# Patient Record
Sex: Female | Born: 1970 | Race: Black or African American | Hispanic: No | Smoking: Former smoker
Health system: Southern US, Community
[De-identification: ages and names within clinical notes are randomized; demographics above are authoritative.]

## PROBLEM LIST (undated history)

## (undated) DIAGNOSIS — I472 Ventricular tachycardia, unspecified: Secondary | ICD-10-CM

## (undated) DIAGNOSIS — D863 Sarcoidosis of skin: Secondary | ICD-10-CM

## (undated) DIAGNOSIS — R519 Headache, unspecified: Secondary | ICD-10-CM

## (undated) DIAGNOSIS — R51 Headache: Secondary | ICD-10-CM

## (undated) DIAGNOSIS — D869 Sarcoidosis, unspecified: Secondary | ICD-10-CM

## (undated) DIAGNOSIS — A3790 Whooping cough, unspecified species without pneumonia: Secondary | ICD-10-CM

## (undated) DIAGNOSIS — G932 Benign intracranial hypertension: Secondary | ICD-10-CM

## (undated) DIAGNOSIS — D860 Sarcoidosis of lung: Secondary | ICD-10-CM

## (undated) DIAGNOSIS — J439 Emphysema, unspecified: Secondary | ICD-10-CM

## (undated) HISTORY — DX: Whooping cough, unspecified species without pneumonia: A37.90

## (undated) HISTORY — DX: Sarcoidosis of lung: D86.0

## (undated) HISTORY — DX: Emphysema, unspecified: J43.9

## (undated) HISTORY — DX: Ventricular tachycardia: I47.2

## (undated) HISTORY — DX: Ventricular tachycardia, unspecified: I47.20

## (undated) HISTORY — DX: Sarcoidosis, unspecified: D86.9

## (undated) HISTORY — DX: Headache, unspecified: R51.9

## (undated) HISTORY — DX: Sarcoidosis of skin: D86.3

## (undated) HISTORY — DX: Benign intracranial hypertension: G93.2

## (undated) HISTORY — DX: Headache: R51

---

## 2001-11-23 ENCOUNTER — Emergency Department (HOSPITAL_COMMUNITY): Admission: EM | Admit: 2001-11-23 | Discharge: 2001-11-24 | Payer: Self-pay | Admitting: Emergency Medicine

## 2002-07-24 ENCOUNTER — Inpatient Hospital Stay (HOSPITAL_COMMUNITY): Admission: AD | Admit: 2002-07-24 | Discharge: 2002-07-27 | Payer: Self-pay | Admitting: Internal Medicine

## 2002-07-25 ENCOUNTER — Encounter: Payer: Self-pay | Admitting: Internal Medicine

## 2002-08-05 ENCOUNTER — Encounter: Payer: Self-pay | Admitting: Emergency Medicine

## 2002-08-05 ENCOUNTER — Emergency Department (HOSPITAL_COMMUNITY): Admission: EM | Admit: 2002-08-05 | Discharge: 2002-08-06 | Payer: Self-pay | Admitting: Emergency Medicine

## 2002-08-07 ENCOUNTER — Emergency Department (HOSPITAL_COMMUNITY): Admission: EM | Admit: 2002-08-07 | Discharge: 2002-08-07 | Payer: Self-pay | Admitting: *Deleted

## 2002-08-08 ENCOUNTER — Other Ambulatory Visit: Admission: RE | Admit: 2002-08-08 | Discharge: 2002-08-08 | Payer: Self-pay | Admitting: Obstetrics and Gynecology

## 2002-08-29 ENCOUNTER — Encounter (INDEPENDENT_AMBULATORY_CARE_PROVIDER_SITE_OTHER): Payer: Self-pay

## 2002-08-29 ENCOUNTER — Inpatient Hospital Stay (HOSPITAL_COMMUNITY): Admission: RE | Admit: 2002-08-29 | Discharge: 2002-08-31 | Payer: Self-pay | Admitting: Obstetrics and Gynecology

## 2007-04-25 HISTORY — PX: OOPHORECTOMY: SHX86

## 2007-11-25 ENCOUNTER — Encounter: Payer: Self-pay | Admitting: Internal Medicine

## 2008-01-03 ENCOUNTER — Encounter: Payer: Self-pay | Admitting: Internal Medicine

## 2008-01-09 ENCOUNTER — Encounter: Payer: Self-pay | Admitting: Internal Medicine

## 2008-01-24 ENCOUNTER — Encounter: Payer: Self-pay | Admitting: Internal Medicine

## 2008-01-27 ENCOUNTER — Emergency Department (HOSPITAL_COMMUNITY): Admission: EM | Admit: 2008-01-27 | Discharge: 2008-01-27 | Payer: Self-pay | Admitting: Emergency Medicine

## 2008-01-28 ENCOUNTER — Emergency Department (HOSPITAL_COMMUNITY): Admission: EM | Admit: 2008-01-28 | Discharge: 2008-01-29 | Payer: Self-pay | Admitting: Emergency Medicine

## 2008-01-29 ENCOUNTER — Inpatient Hospital Stay (HOSPITAL_COMMUNITY): Admission: EM | Admit: 2008-01-29 | Discharge: 2008-01-31 | Payer: Self-pay | Admitting: Emergency Medicine

## 2008-03-02 ENCOUNTER — Inpatient Hospital Stay (HOSPITAL_COMMUNITY): Admission: RE | Admit: 2008-03-02 | Discharge: 2008-03-04 | Payer: Self-pay | Admitting: Obstetrics and Gynecology

## 2008-03-02 ENCOUNTER — Encounter (INDEPENDENT_AMBULATORY_CARE_PROVIDER_SITE_OTHER): Payer: Self-pay | Admitting: Obstetrics and Gynecology

## 2008-10-11 ENCOUNTER — Encounter: Admission: RE | Admit: 2008-10-11 | Discharge: 2008-10-11 | Payer: Self-pay | Admitting: Family Medicine

## 2008-11-27 ENCOUNTER — Encounter: Payer: Self-pay | Admitting: Internal Medicine

## 2009-02-19 ENCOUNTER — Encounter: Admission: RE | Admit: 2009-02-19 | Discharge: 2009-02-19 | Payer: Self-pay | Admitting: Obstetrics and Gynecology

## 2009-05-07 ENCOUNTER — Encounter: Payer: Self-pay | Admitting: Internal Medicine

## 2009-05-12 ENCOUNTER — Ambulatory Visit: Payer: Self-pay | Admitting: Internal Medicine

## 2009-05-12 DIAGNOSIS — G932 Benign intracranial hypertension: Secondary | ICD-10-CM

## 2009-05-12 DIAGNOSIS — R069 Unspecified abnormalities of breathing: Secondary | ICD-10-CM | POA: Insufficient documentation

## 2009-05-12 DIAGNOSIS — D869 Sarcoidosis, unspecified: Secondary | ICD-10-CM

## 2009-05-13 ENCOUNTER — Ambulatory Visit: Payer: Self-pay | Admitting: Internal Medicine

## 2009-05-14 ENCOUNTER — Ambulatory Visit: Payer: Self-pay | Admitting: Internal Medicine

## 2009-05-14 ENCOUNTER — Telehealth: Payer: Self-pay | Admitting: Internal Medicine

## 2009-05-14 LAB — CONVERTED CEMR LAB: Prothrombin Time: 12.4 s (ref 10.9–13.3)

## 2009-05-19 ENCOUNTER — Encounter: Payer: Self-pay | Admitting: Internal Medicine

## 2009-05-27 ENCOUNTER — Encounter: Payer: Self-pay | Admitting: Thoracic Surgery

## 2009-05-27 ENCOUNTER — Ambulatory Visit: Payer: Self-pay | Admitting: Internal Medicine

## 2009-05-27 ENCOUNTER — Ambulatory Visit (HOSPITAL_COMMUNITY): Admission: RE | Admit: 2009-05-27 | Discharge: 2009-05-27 | Payer: Self-pay | Admitting: Thoracic Surgery

## 2009-05-27 ENCOUNTER — Ambulatory Visit: Payer: Self-pay | Admitting: Thoracic Surgery

## 2009-05-31 ENCOUNTER — Telehealth: Payer: Self-pay | Admitting: Internal Medicine

## 2009-06-02 ENCOUNTER — Ambulatory Visit: Payer: Self-pay | Admitting: Internal Medicine

## 2009-06-07 ENCOUNTER — Telehealth (INDEPENDENT_AMBULATORY_CARE_PROVIDER_SITE_OTHER): Payer: Self-pay | Admitting: *Deleted

## 2009-06-07 ENCOUNTER — Ambulatory Visit: Payer: Self-pay | Admitting: Internal Medicine

## 2009-06-11 ENCOUNTER — Encounter: Payer: Self-pay | Admitting: Internal Medicine

## 2009-06-16 ENCOUNTER — Ambulatory Visit: Payer: Self-pay | Admitting: Internal Medicine

## 2009-06-16 LAB — CONVERTED CEMR LAB
ALT: 26 units/L (ref 0–35)
AST: 18 units/L (ref 0–37)
BUN: 10 mg/dL (ref 6–23)
Basophils Relative: 0.3 % (ref 0.0–3.0)
Bilirubin, Direct: 0.1 mg/dL (ref 0.0–0.3)
Calcium: 9.4 mg/dL (ref 8.4–10.5)
Creatinine, Ser: 0.7 mg/dL (ref 0.4–1.2)
Eosinophils Relative: 0 % (ref 0.0–5.0)
GFR calc non Af Amer: 121.78 mL/min (ref >60)
HCT: 35 % — ABNORMAL LOW (ref 36.0–46.0)
MCV: 80.1 fL (ref 78.0–100.0)
Monocytes Absolute: 0.4 10*3/uL (ref 0.1–1.0)
Monocytes Relative: 2.8 % — ABNORMAL LOW (ref 3.0–12.0)
Neutrophils Relative %: 89.1 % — ABNORMAL HIGH (ref 43.0–77.0)
Potassium: 4.6 meq/L (ref 3.5–5.1)
RBC: 4.37 M/uL (ref 3.87–5.11)
Total Protein: 7.3 g/dL (ref 6.0–8.3)
WBC: 15.1 10*3/uL — ABNORMAL HIGH (ref 4.5–10.5)

## 2009-06-25 ENCOUNTER — Telehealth: Payer: Self-pay | Admitting: Internal Medicine

## 2009-07-09 ENCOUNTER — Encounter: Payer: Self-pay | Admitting: Internal Medicine

## 2009-07-09 ENCOUNTER — Ambulatory Visit: Payer: Self-pay | Admitting: Internal Medicine

## 2009-07-14 ENCOUNTER — Telehealth (INDEPENDENT_AMBULATORY_CARE_PROVIDER_SITE_OTHER): Payer: Self-pay | Admitting: *Deleted

## 2009-07-14 LAB — CONVERTED CEMR LAB
AST: 22 units/L (ref 0–37)
Albumin: 3.7 g/dL (ref 3.5–5.2)
Alkaline Phosphatase: 91 units/L (ref 39–117)
Basophils Relative: 0.4 % (ref 0.0–3.0)
Bilirubin, Direct: 0.1 mg/dL (ref 0.0–0.3)
CO2: 32 meq/L (ref 19–32)
Calcium: 9 mg/dL (ref 8.4–10.5)
Creatinine, Ser: 0.7 mg/dL (ref 0.4–1.2)
Eosinophils Relative: 1.5 % (ref 0.0–5.0)
GFR calc non Af Amer: 121.74 mL/min (ref >60)
Hemoglobin: 11 g/dL — ABNORMAL LOW (ref 12.0–15.0)
Lymphocytes Relative: 17.7 % (ref 12.0–46.0)
Monocytes Relative: 6.7 % (ref 3.0–12.0)
Neutro Abs: 5.4 10*3/uL (ref 1.4–7.7)
Neutrophils Relative %: 73.7 % (ref 43.0–77.0)
RBC: 4.06 M/uL (ref 3.87–5.11)
Sodium: 142 meq/L (ref 135–145)
Total Protein: 6.9 g/dL (ref 6.0–8.3)
WBC: 7.3 10*3/uL (ref 4.5–10.5)

## 2009-08-13 ENCOUNTER — Ambulatory Visit: Payer: Self-pay | Admitting: Internal Medicine

## 2009-08-26 ENCOUNTER — Telehealth: Payer: Self-pay | Admitting: Internal Medicine

## 2009-08-26 LAB — CONVERTED CEMR LAB
AST: 19 units/L (ref 0–37)
Alkaline Phosphatase: 105 units/L (ref 39–117)
BUN: 18 mg/dL (ref 6–23)
Basophils Absolute: 0.1 10*3/uL (ref 0.0–0.1)
Bilirubin, Direct: 0.1 mg/dL (ref 0.0–0.3)
CO2: 25 meq/L (ref 19–32)
Calcium: 9.2 mg/dL (ref 8.4–10.5)
Chloride: 110 meq/L (ref 96–112)
Creatinine, Ser: 0.8 mg/dL (ref 0.4–1.2)
Eosinophils Absolute: 0.1 10*3/uL (ref 0.0–0.7)
Hemoglobin: 11.8 g/dL — ABNORMAL LOW (ref 12.0–15.0)
Lymphocytes Relative: 13.1 % (ref 12.0–46.0)
MCHC: 33.6 g/dL (ref 30.0–36.0)
Monocytes Relative: 2.5 % — ABNORMAL LOW (ref 3.0–12.0)
Neutro Abs: 6.9 10*3/uL (ref 1.4–7.7)
Neutrophils Relative %: 81.6 % — ABNORMAL HIGH (ref 43.0–77.0)
RBC: 4.22 M/uL (ref 3.87–5.11)
RDW: 18 % — ABNORMAL HIGH (ref 11.5–14.6)

## 2009-09-03 ENCOUNTER — Encounter: Payer: Self-pay | Admitting: Internal Medicine

## 2009-09-14 ENCOUNTER — Ambulatory Visit: Payer: Self-pay | Admitting: Internal Medicine

## 2009-09-21 ENCOUNTER — Encounter: Admission: RE | Admit: 2009-09-21 | Discharge: 2009-09-21 | Payer: Self-pay | Admitting: Obstetrics and Gynecology

## 2009-11-02 ENCOUNTER — Telehealth: Payer: Self-pay | Admitting: Internal Medicine

## 2009-11-12 ENCOUNTER — Encounter: Payer: Self-pay | Admitting: Internal Medicine

## 2009-12-25 ENCOUNTER — Encounter: Payer: Self-pay | Admitting: Internal Medicine

## 2009-12-27 ENCOUNTER — Encounter: Payer: Self-pay | Admitting: Internal Medicine

## 2010-01-03 ENCOUNTER — Encounter: Payer: Self-pay | Admitting: Internal Medicine

## 2010-01-17 ENCOUNTER — Telehealth: Payer: Self-pay | Admitting: Internal Medicine

## 2010-01-24 ENCOUNTER — Encounter: Payer: Self-pay | Admitting: Internal Medicine

## 2010-01-28 ENCOUNTER — Ambulatory Visit: Payer: Self-pay | Admitting: Internal Medicine

## 2010-01-31 ENCOUNTER — Telehealth (INDEPENDENT_AMBULATORY_CARE_PROVIDER_SITE_OTHER): Payer: Self-pay | Admitting: *Deleted

## 2010-03-01 ENCOUNTER — Encounter: Admission: RE | Admit: 2010-03-01 | Discharge: 2010-03-01 | Payer: Self-pay | Admitting: Obstetrics and Gynecology

## 2010-06-13 ENCOUNTER — Ambulatory Visit: Payer: Self-pay | Admitting: Internal Medicine

## 2010-06-13 ENCOUNTER — Telehealth (INDEPENDENT_AMBULATORY_CARE_PROVIDER_SITE_OTHER): Payer: Self-pay | Admitting: *Deleted

## 2010-06-16 ENCOUNTER — Encounter: Payer: Self-pay | Admitting: Internal Medicine

## 2010-06-17 LAB — CONVERTED CEMR LAB
BUN: 9 mg/dL (ref 6–23)
Basophils Absolute: 0 10*3/uL (ref 0.0–0.1)
Basophils Relative: 0.1 % (ref 0.0–3.0)
Bilirubin, Direct: 0.1 mg/dL (ref 0.0–0.3)
CO2: 28 meq/L (ref 19–32)
Calcium: 9.4 mg/dL (ref 8.4–10.5)
Chloride: 108 meq/L (ref 96–112)
Creatinine, Ser: 0.6 mg/dL (ref 0.4–1.2)
Eosinophils Absolute: 0 10*3/uL (ref 0.0–0.7)
Glucose, Bld: 115 mg/dL — ABNORMAL HIGH (ref 70–99)
Lymphocytes Relative: 4.6 % — ABNORMAL LOW (ref 12.0–46.0)
MCHC: 32.4 g/dL (ref 30.0–36.0)
MCV: 79.1 fL (ref 78.0–100.0)
Monocytes Absolute: 0.3 10*3/uL (ref 0.1–1.0)
Neutrophils Relative %: 92.9 % — ABNORMAL HIGH (ref 43.0–77.0)
Platelets: 826 10*3/uL — ABNORMAL HIGH (ref 150.0–400.0)
RDW: 16.4 % — ABNORMAL HIGH (ref 11.5–14.6)
Total Bilirubin: 0.3 mg/dL (ref 0.3–1.2)
Total Protein: 8.1 g/dL (ref 6.0–8.3)

## 2010-07-25 ENCOUNTER — Ambulatory Visit: Payer: Self-pay | Admitting: Internal Medicine

## 2010-08-08 ENCOUNTER — Telehealth: Payer: Self-pay | Admitting: Internal Medicine

## 2010-09-06 ENCOUNTER — Ambulatory Visit: Payer: Self-pay | Admitting: Internal Medicine

## 2010-09-06 ENCOUNTER — Encounter: Payer: Self-pay | Admitting: Internal Medicine

## 2010-09-07 LAB — CONVERTED CEMR LAB
ALT: 32 units/L (ref 0–35)
AST: 22 units/L (ref 0–37)
Albumin: 4 g/dL (ref 3.5–5.2)
BUN: 11 mg/dL (ref 6–23)
Basophils Relative: 0.1 % (ref 0.0–3.0)
Chloride: 102 meq/L (ref 96–112)
Creatinine, Ser: 0.8 mg/dL (ref 0.4–1.2)
Eosinophils Relative: 0.7 % (ref 0.0–5.0)
Glucose, Bld: 83 mg/dL (ref 70–99)
Hemoglobin: 12.5 g/dL (ref 12.0–15.0)
Hgb A1c MFr Bld: 6.5 % (ref 4.6–6.5)
MCV: 84.9 fL (ref 78.0–100.0)
Monocytes Absolute: 0.9 10*3/uL (ref 0.1–1.0)
Neutro Abs: 7.1 10*3/uL (ref 1.4–7.7)
Neutrophils Relative %: 75.9 % (ref 43.0–77.0)
Potassium: 4.2 meq/L (ref 3.5–5.1)
RBC: 4.45 M/uL (ref 3.87–5.11)
WBC: 9.4 10*3/uL (ref 4.5–10.5)

## 2011-01-18 ENCOUNTER — Telehealth (INDEPENDENT_AMBULATORY_CARE_PROVIDER_SITE_OTHER): Payer: Self-pay | Admitting: *Deleted

## 2011-01-20 ENCOUNTER — Other Ambulatory Visit: Payer: Self-pay | Admitting: Internal Medicine

## 2011-01-20 ENCOUNTER — Ambulatory Visit
Admission: RE | Admit: 2011-01-20 | Discharge: 2011-01-20 | Payer: Self-pay | Source: Home / Self Care | Attending: Internal Medicine | Admitting: Internal Medicine

## 2011-01-20 ENCOUNTER — Encounter: Payer: Self-pay | Admitting: Internal Medicine

## 2011-01-20 DIAGNOSIS — A3790 Whooping cough, unspecified species without pneumonia: Secondary | ICD-10-CM | POA: Insufficient documentation

## 2011-01-20 DIAGNOSIS — I472 Ventricular tachycardia: Secondary | ICD-10-CM | POA: Insufficient documentation

## 2011-01-20 DIAGNOSIS — E559 Vitamin D deficiency, unspecified: Secondary | ICD-10-CM | POA: Insufficient documentation

## 2011-01-20 LAB — CBC WITH DIFFERENTIAL/PLATELET
Basophils Absolute: 0 10*3/uL (ref 0.0–0.1)
Hemoglobin: 12 g/dL (ref 12.0–15.0)
Lymphocytes Relative: 10.4 % — ABNORMAL LOW (ref 12.0–46.0)
Monocytes Relative: 10.8 % (ref 3.0–12.0)
Neutro Abs: 6.7 10*3/uL (ref 1.4–7.7)
RDW: 14.8 % — ABNORMAL HIGH (ref 11.5–14.6)

## 2011-01-20 LAB — BASIC METABOLIC PANEL
CO2: 27 mEq/L (ref 19–32)
Chloride: 107 mEq/L (ref 96–112)
Creatinine, Ser: 0.8 mg/dL (ref 0.4–1.2)

## 2011-01-20 LAB — HEPATIC FUNCTION PANEL
ALT: 28 U/L (ref 0–35)
Alkaline Phosphatase: 135 U/L — ABNORMAL HIGH (ref 39–117)
Bilirubin, Direct: 0 mg/dL (ref 0.0–0.3)
Total Protein: 8 g/dL (ref 6.0–8.3)

## 2011-01-22 LAB — CONVERTED CEMR LAB
ALT: 15 units/L (ref 0–35)
AST: 16 units/L (ref 0–37)
Basophils Absolute: 0.3 10*3/uL — ABNORMAL HIGH (ref 0.0–0.1)
Bilirubin, Direct: 0.1 mg/dL (ref 0.0–0.3)
Chloride: 105 meq/L (ref 96–112)
Eosinophils Absolute: 0.1 10*3/uL (ref 0.0–0.7)
Lymphocytes Relative: 19 % (ref 12.0–46.0)
MCHC: 33.3 g/dL (ref 30.0–36.0)
Neutrophils Relative %: 67 % (ref 43.0–77.0)
Potassium: 3.9 meq/L (ref 3.5–5.1)
RBC: 4.27 M/uL (ref 3.87–5.11)
RDW: 14.6 % (ref 11.5–14.6)
Total Bilirubin: 0.4 mg/dL (ref 0.3–1.2)

## 2011-01-23 ENCOUNTER — Telehealth: Payer: Self-pay | Admitting: Internal Medicine

## 2011-01-26 DIAGNOSIS — R002 Palpitations: Secondary | ICD-10-CM

## 2011-01-26 NOTE — Letter (Signed)
Summary: Out of Work  [employer]  [address]   [place], [place] [address]   Phone: [phone]  Fax: [phone]    January 20, 2011   Employee:  Alexandra Hood    To Whom It May Concern:   For Medical reasons, please excuse the above named employee from work.   If you need additional information, please feel free to contact our office.         Sincerely,    Dr. Kalman Shan

## 2011-01-26 NOTE — Assessment & Plan Note (Signed)
Summary: rov ///kp   Visit Type:  Follow-up Copy to:  Dr. Janace Hoard Primary Provider/Referring Provider:  Dr. Janace Hoard [facility], DR. Miller at [facility] in [facility], Dr. Dione Booze at [facility] eye  CC:  6 wk follow up.  Pt states cough has resolved.  Denies SOB, wheezing, and chest tightness.  No complaints.  .  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   OV 07/25/2010:   Lsat visit was June 13, 2010.  At that time she reported  NEW cough even though the  the isolated lupus pernio in her nose had improved. PFT reviw showed deterioratioin in lung function and CXR showed progressive disease. So we restarted prednisone and methotrexate with folic acid/bactrim. Since then cough has resolved. She is feeling better overall. More energetic. Only issue is 13# weight gain to 109#. Fev1 shows modest improveemt from 1.6L pretreatment to 1.84L today which is still shy of personal best of 2.3L in Sept 2010 which was done 3 months into pred/mtx at [facility] in [place].    Preventive Screening-Counseling & Management  Alcohol-Tobacco     Smoking Status: quit  Current Medications (verified): 1)  Diamox 250 Mg .... Take 1 Tablet By Mouth Two Times A Day 2)  Motrin Ib 200 Mg Tabs (Ibuprofen) .... As Directed 3)  Methotrexate 2.5 Mg Tabs (Methotrexate Sodium) .... Take 2 Tablet Every Monday X 1 Week Tkae 3 Tablets Every Monday X 1 Week Then Take 4 Tabs Every Monday X Continue 4)  Prednisone 10 Mg  Tabs (Prednisone) .... Take 1 Tablet Per Day Starting July 13, 2010 5)  Folic Acid 800 Mcg Tabs (Folic Acid) .... Take 1 Tablet By Mouth Two Times A Day 6)  Bactrim Ds 800-160 Mg Tabs (Sulfamethoxazole-Trimethoprim) .... Take 1 Tablet On Monday, Wednesday,  Friday  Allergies (verified): No Known Drug Allergies  Past History:  Past Medical  History: UPDATED 07/09/2009 #Pseudotumor Cerebri ........Marland KitchenDr Hyacinth Meeker and Dr. Dione Booze -> dxed Jan 2009, on diamox -> LP 01/24/2008 at [facility] - Normal ( pressure 14cm, gluc 68, Protein 23, Clear, colorless, RBC 3, WBC - too few to count) -> CT head non contrast done at [facility] 11/25/2007: "normal". Ddoes not report MRI, MRA, MRV being done > 2nd opinon by Dr. Pearlean Brownie - reportdly had MRI/MRV both normal; followup pending  #LUPUS PERNIO/SARCOIDOSIS -> skin bx confirmed at [facility] 05/07/2009 (personally reviewed) >looks improved 07/09/2009 -picture taken #STage 2 pulmonary sarcoidosois.............Lavinia Sharps -> CT chest 05/13/2009. -> bx confirmed by EBUS and RUL TBBx on 05/27/2009 >Commence prednisone/methotrexate/folic acid/bactrim 06/02/2009 >PFT 06/07/2009: Fev1 2.2L/77%, FVC 2.68L/77%, TLC 3.8L/75%, DLCO 19/77% > July 2010: asymptomatic but cxr without change > Cleda Daub 09/03/2009 at [facility] - ->  fev1 2.3L/ FVC 89%, 2.84L/90%.  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  These include a 1.3   cm lesion in the medial segment left hepatic lobe on image 43 of   series 2; a 1.0 cm lesion of the lateral segment left hepatic lobe   on image 46 of series  2; a 1.7 x 1.4 cm lesion of the right hepatic   lobe on image 53 of series 2 with associated adjacent transit   hepatic attenuation difference; and several additional visualized   small hypodense liver lesions.  An enlarged portacaval node   measures 3.6 x 1.4 cm on image 54 series 2. #PPD negative - 04/2009  Past Surgical History: Reviewed history from 05/12/2009 and no changes required. Removal of Left  Ovary-2004 Removalof right ovary and fallopian tubes 2009  Family History: Reviewed history from 05/12/2009 and no changes required. Allergies-mother No sarcoid, asthma,   Social History: Reviewed history from 05/12/2009 and no changes required. 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 Smoked for 17 years before that - 4 cig/daySmoking Status:  quit  Review of Systems       The patient complains of weight change.  The patient denies shortness of breath with activity, shortness of breath at rest, productive cough, non-productive cough, coughing up blood, chest pain, irregular heartbeats, acid heartburn, indigestion, loss of appetite, abdominal pain, difficulty swallowing, sore throat, tooth/dental problems, headaches, nasal congestion/difficulty breathing through nose, sneezing, itching, ear ache, anxiety, depression, hand/feet swelling, joint stiffness or pain, rash, change in color of mucus, and fever.    Vital Signs:  Patient profile:   40 year old female Height:      64 inches Weight:      190.13 pounds BMI:     32.75 O2 Sat:      98 % on Room air Temp:     96.6 degrees F oral Pulse rate:   121 / minute BP sitting:   128 / 88  (right arm) Cuff size:   regular  Vitals Entered By: Gweneth Dimitri RN (July 25, 2010 2:45 PM)  O2 Flow:  Room air CC: 6 wk follow up.  Pt states cough has resolved.  Denies SOB, wheezing, chest tightness.  No complaints.   Comments Medications reviewed with patient Daytime contact number verified with patient. Gweneth Dimitri RN  July 25, 2010 2:45 PM    Physical Exam  General:  well developed, well nourished, in no acute distress.Pleasant female Head:  normocephalic and atraumatic Eyes:  PERRLA/EOM intact; conjunctiva and sclera clear Ears:  TMs intact and clear with normal canals Nose:  flat scab lupus pernio in right nasal bridge; looks almost resolved Mouth:  no deformity or lesions Neck:   no masses, thyromegaly, or abnormal cervical nodes Chest Wall:  no deformities noted Lungs:  clear bilaterally to auscultation and percussion Heart:  regular rate and rhythm, S1, S2 without murmurs, rubs, gallops, or clicks Abdomen:  bowel sounds positive; abdomen soft and non-tender without masses, or organomegaly Msk:  rt shoulder painful abdouction Pulses:  pulses normal Extremities:  no clubbing, cyanosis, edema, or deformity noted Neurologic:  CN II-XII grossly intact with normal reflexes, coordination, muscle strength and tone Skin:  intact without lesions or rashes Cervical Nodes:  no significant adenopathy Axillary Nodes:  no significant adenopathy Psych:  alert and cooperative; normal mood and affect; normal attention span and concentration   Pulmonary Function Test Date: 07/25/2010 3:03 PM Height (in.): 64 Gender: Female  Pre-Spirometry FVC    Value: 2.25 L/min   % Pred: 72.50 % FEV1    Value: 1.84 L     Pred: 2.58 L     % Pred: 71.40 % FEV1/FVC  Value: 82.05 %     % Pred: 97.60 %  Comments: improved  Evaluation: moderate obstruction with NO significant bronchodilator response  Impression & Recommendations:  Problem # 1:  SARCOIDOSIS (ICD-135) Assessment Improved Since coming off methotrexate in and weaning prednisine downt to 5mg  per day in sept 2010, sarcoidosis has progressed as evicencd by cxr getting worse in feb 2011 and cough in with worsening restriction on PFTs in June 2011. REcommenced pred burst and methotrexate in June 13, 2010. On visit 07/25/2010 cough resolved and spiromettry improved althought not at baseline. WE discussed further taper down of prednisone to 5mg  per day but she prefers to be at 10mg  per day despite weight gain. SHe hopes that with 10mg  per day and methtotrexate that her PFTS will slowly bounce back to baseline. I support this plan  Problem # 2:  ENCOUNTER FOR THERAPEUTIC DRUG MONITORING (ICD-V58.83) Assessment: Unchanged  monitor weight  gain check vitamin d next visit check hgba1c and lfts  Orders: T- * Misc. Laboratory test [phone]) Est. Patient Level IV (13086)  Problem # 3:  PALPITATIONS (ICD-785.1) Assessment: Improved  still in remission  Orders: Est. Patient Level IV (57846)  Problem # 4:  UNSPECIFIED RESPIRATORY ABNORMALITY (ICD-786.00)  Medications Added to Medication List This Visit: 1)  Folic Acid 800 Mcg Tabs (Folic acid) .... Take 1 tablet by mouth two times a day 2)  Bactrim Ds 800-160 Mg Tabs (Sulfamethoxazole-trimethoprim) .... Take 1 tablet on monday, wednesday,  friday  Patient Instructions: 1)  your breathing test shows improvement but not at baseline yet 2)  continue prednisone at 10mg  per day 3)  continue methotrexate 10mg  per week 4)  continue bactrim as before 5)  continue folic acid as before 6)  return in 6 weeks 7)  we will do spirometry at next visit 8)  check your vitamin d level today  9)  my nurse will print out your breathing test for your files     [employer] Spirometry  ID: [id number] Patient: Alexandra Hood, Alexandra Hood DOB: April 07, 1971 Age: 40 Years Old Sex: Female Race: Undetermined Height: 64 Weight: 190.13 Status: Unconfirmed Past Medical History:  UPDATED 07/09/2009 #Pseudotumor Cerebri ........Marland KitchenDr Hyacinth Meeker and Dr. Dione Booze -> dxed Jan 2009, on diamox -> LP 01/24/2008 at [facility] - Normal ( pressure 14cm, gluc 68, Protein 23, Clear, colorless, RBC 3, WBC - too few to count) -> CT head non contrast done at [facility] 11/25/2007: "normal". Ddoes not report MRI, MRA, MRV being done > 2nd opinon by Dr. Pearlean Brownie - reportdly had MRI/MRV both normal; followup pending  #LUPUS PERNIO/SARCOIDOSIS -> skin bx confirmed at [facility] 05/07/2009 (personally reviewed) >looks improved 07/09/2009 -picture taken #STage 2 pulmonary sarcoidosois.............Lavinia Sharps -> CT chest 05/13/2009. -> bx confirmed by EBUS and RUL TBBx on 05/27/2009 >Commence  prednisone/methotrexate/folic acid/bactrim 06/02/2009 >PFT 06/07/2009: Fev1 2.2L/77%, FVC 2.68L/77%, TLC 3.8L/75%, DLCO 19/77% > July 2010: asymptomatic but cxr without change > Cleda Daub 09/03/2009 at [facility] - ->  fev1 2.3L/ FVC 89%, 2.84L/90%.  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  These include a 1.3   cm lesion in the medial segment left hepatic lobe on image 43 of   series 2; a 1.0 cm lesion of the lateral segment left hepatic lobe   on image 46 of series 2; a 1.7 x 1.4 cm lesion of the right hepatic   lobe on  image 53 of series 2 with associated adjacent transit   hepatic attenuation difference; and several additional visualized   small hypodense liver lesions.  An enlarged portacaval node   measures 3.6 x 1.4 cm on image 54 series 2. #PPD negative - 04/2009 Recorded: 07/25/2010 3:03 PM  Parameter  Measured Predicted %Predicted FVC     2.25        3.10        72.50 FEV1     1.84        2.58        71.40 FEV1%   82.05        84.11        97.60 PEF    4.08        6.66        61.20   Interpretation:

## 2011-01-26 NOTE — Progress Notes (Signed)
Summary: med - LMTCB  x1  Phone Note Call from Patient Call back at [facility] Phone [phone]   Caller: Patient Call For: ramaswamy Reason for Call: Talk to Nurse Summary of Call: heard of a new medication to go with prednisone - topomax.  Could she use this in addition to prednisone Initial call taken by: Eugene Gavia,  August 08, 2010 4:42 PM  Follow-up for Phone Call        [facility] Yetta Barre RN  August 08, 2010 4:50 PM\par  Pt returned call.  States she has heard that topamax along withprednisone can be used to treat sarcoidosis.  Would like to know if this is true and if so, would she be a canadiate for this.  Aware MR out of the office until tomorrow--she is ok with this.  MR, pls advise.  Thanks! Follow-up by: Gweneth Dimitri RN,  August 08, 2010 5:29 PM  Additional Follow-up for Phone Call Additional follow up Details #1::        SPOKE WITH PATIEnt: her QUESTION was not if topamax is used in treatment of sarcoid. But, her question was if topamax is safe to take when being treated for sarcoid with prednisone. She already spoke to her neurologist and got this clarified. She has been told it is safe and she has scriopt for topamax from her neurologist Additional Follow-up by: Kalman Shan MD,  August 09, 2010 6:16 PM

## 2011-01-26 NOTE — Assessment & Plan Note (Signed)
Summary: 1 year return/mhh   Copy to:  Dr. Janace Hoard Primary Provider/Referring Provider:  Dr. Janace Hoard [facility], DR. Hyacinth Meeker at [facility] in [facility], Dr. Dione Booze at [facility] eye   History of Present Illness: she no showed and rescheduled  Allergies: No Known Drug Allergies   Other Orders: No Charge Patient Arrived (NCPA0) (NCPA0)

## 2011-01-26 NOTE — Progress Notes (Signed)
Summary: Medical Release completed  Medical release completed by patient to obtain records from Dr. Althea Charon. Release faxed to [phone]. Dena Chavis  January 31, 2010 9:02 AM  Appended Document: Medical Release completed Records received from Dr. Althea Charon today and forwarded to Dr. Marchelle Gearing for review.

## 2011-01-26 NOTE — Miscellaneous (Signed)
Summary: Orders Update pft charges  Clinical Lists Changes  Orders: Added new Service order of Carbon Monoxide diffusing w/capacity (94720) - Signed Added new Service order of Lung Volumes (94240) - Signed Added new Service order of Spirometry (Pre & Post) (94060) - Signed 

## 2011-01-26 NOTE — Progress Notes (Signed)
Summary: returned call  Phone Note Outgoing Call Call back at [phone]   Call placed by: Carron Curie CMA,  January 17, 2010 3:44 PM Call placed to: Patient Summary of Call: [facility] and had an xray done that had some sbnormailities on it.  MR wanted to get pt scheduled for an appt thsi week if possible. LMTCB with the patient.  Initial call taken by: Carron Curie CMA,  January 17, 2010 3:47 PM  Follow-up for Phone Call        pt returning call to [facility]. Follow-up by: Eugene Gavia,  January 18, 2010 9:00 AM  Additional Follow-up for Phone Call Additional follow up Details #1::        spoke with MR and there was some misunderstaqnding with the letter snet form [facility]. Okay for pt to f/u first available. Carron Curie CMA  January 18, 2010 12:18 PM pt scheduled to see MR on 01/28/10. pt aware. Carron Curie CMA  January 18, 2010 12:21 PM

## 2011-01-26 NOTE — Assessment & Plan Note (Signed)
Summary: 6 weeks/apc   Visit Type:  Follow-up Copy to:  Dr. Janace Hoard Primary Provider/Referring Provider:  Dr. Janace Hoard [facility], DR. Hyacinth Meeker at [facility] in [facility], Dr. Dione Booze at [facility] eye  CC:  Pt here for follow-up. Pt denies any new complaints at this time. 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 Folloowup Lupuse Pernio and Stage 2 Pulmonary sarcoid - s/p Pred/Methotrexate Rx June 2010 through sept 2010 wiht nomralization of PFT. Progressive disease Feb - June 2011 with cough. Restart methotrexate and increase prednisone June 2011.    September 06, 2010: Lsat visit was August 2011. At that viist cough had resolved after restarting methotrxate and increasing prednisone i June 2011. PFTs although better was still not at baseline. So, she opted to continue pred at 10mg  per day. Today's visit is fu to asess symptoms and spirometry. SHe contnues to be free of resp symptoms. But has gained 8# weight and is c/p occ leg cramps past 2 weeks. No other symptoms.  Wants to wait for flu shot to be given at her work place  Preventive Screening-Counseling & Management  Alcohol-Tobacco     Smoking Status: quit     Packs/Day: <0.25     Year Started: 1992     Year Quit: 2009     Pack years: 4.25     Tobacco Counseling: not to resume use of tobacco products  Current Medications (verified): 1)  Diamox 250 Mg .... Take 1 Tablet By Mouth Two Times A Day 2)  Motrin Ib 200 Mg Tabs (Ibuprofen) .... As Directed 3)  Methotrexate 2.5 Mg Tabs (Methotrexate Sodium) .... Take 2 Tablet Every Monday X 1 Week Tkae 3 Tablets Every Monday X 1 Week Then Take 4 Tabs Every Monday X Continue 4)  Folic Acid 800 Mcg Tabs (Folic Acid) .... Take 1 Tablet By Mouth Two Times A Day 5)  Bactrim Ds 800-160 Mg Tabs (Sulfamethoxazole-Trimethoprim) .... Take 1 Tablet On Monday, Wednesday,  Friday 6)  Prednisone 10 Mg Tabs (Prednisone) .... Take 1 Tablet By Mouth Once A Day 7)   Topamax 25 Mg Tabs (Topiramate) .... Take 1 Tablet By Mouth Two Times A Day 8)  Calcium 500 Mg Tabs (Calcium) .... Take 1 Tablet By Mouth Once A Day 9)  Vitamin D3 1000 Unit Tabs (Cholecalciferol) .... Take 1 Tablet By Mouth Once A Day 10)  Iron 325 (65 Fe) Mg Tabs (Ferrous Sulfate) .... Take 1 Tablet By Mouth Once A Day  Allergies (verified): No Known Drug Allergies  Past History:  Past medical, surgical, family and social histories (including risk factors) reviewed, and no changes noted (except as noted below).  Past Medical History: U#Pseudotumor Cerebri ........Marland KitchenDr Hyacinth Meeker and Dr. Dione Booze  - dxed Jan 2009, on diamox  - LP 01/24/2008 at [facility] - Normal ( pressure 14cm, gluc 68, Protein 23, Clear, colorless, RBC 3, WBC - too few to count) - CT head non contrast done at [facility] 11/25/2007: "normal". Ddoes not report MRI, MRA, MRV being done  - 2nd opinon by Dr. Pearlean Brownie - reportdly had MRI/MRV both normal; followup pending  #LUPUS PERNIO/SARCOIDOSIS -skin bx confirmed at [facility] 05/07/2009 (personally reviewed)  -looks improved 07/09/2009 -picture taken  #STage 2 pulmonary sarcoidosois.............Lavinia Sharps - CT chest 05/13/2009. -> bx confirmed by EBUS and RUL TBBx on 05/27/2009  -Commence prednisone/methotrexate/folic acid/bactrim 06/02/2009  -PFT 06/07/2009: Fev1 2.2L/77%, FVC 2.68L/77%, TLC 3.8L/75%, DLCO 19/77%  - July 2010: asymptomatic but cxr without  change  Cleda Daub 09/03/2009 at [facility] - ->  fev1 2.3L/ FVC 89%, 2.84L/90%.  Ratio 83 -> stop mtx, wean pred  - CXR 01/28/2010 -> progressive diseae -> continue pred at 5mg   - PFT 06/13/2010 on pred 5mg  - fev1 1.6L/54%, FVC 2.0L/55%, Ratio 77% , TLC 60%, DLCO 16/63% -> restart pred/mtx  Cleda Daub 07/25/2010 on pred 10/mtx 10 -> FEv1 1.84L/775%, FVC 2.25L  - SPiro 09/06/2010 -> Fev1 2.14L/81%. Cut pred to 5mg  per day. 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 Removal of Left Ovary-2004 Removalof right ovary and fallopian tubes 2009  Family History: Reviewed history from 05/12/2009 and no changes required. Allergies-mother No sarcoid, asthma,   Social History: Reviewed history from 05/12/2009 and no changes required. 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 Smoked for 17 years before that - 4 cig/dayPacks/Day:  <0.25 Pack years:  4.25  Review of Systems  The patient denies shortness of breath with activity, shortness of breath at rest, productive cough, non-productive cough, coughing up blood, chest pain, irregular heartbeats, acid heartburn, indigestion, loss of appetite, weight change, abdominal pain, difficulty swallowing, sore throat, tooth/dental problems, headaches, nasal congestion/difficulty breathing through nose, sneezing, itching, ear ache, anxiety, depression, hand/feet swelling, joint stiffness or pain, rash, change in color of mucus, and fever.    Vital Signs:  Patient profile:   40 year old female Height:      64 inches Weight:      198 pounds BMI:     34.11 O2 Sat:      98 % on Room air Temp:     98.6 degrees F oral Pulse rate:   127 / minute BP sitting:   130 / 80  (right arm) Cuff size:   regular  Vitals Entered By: Carron Curie CMA (September 06, 2010 3:39 PM)  O2 Flow:  Room air CC: Pt here for follow-up. Pt denies any new complaints at this time.    Physical Exam  General:  well developed, well nourished, in no acute distress.Pleasant female Head:  normocephalic and atraumatic Eyes:  PERRLA/EOM intact; conjunctiva and sclera clear Ears:  TMs intact and clear with normal canals Nose:  flat scab lupus pernio in right nasal bridge; looks almost resolved Mouth:  no deformity or lesions Neck:  no masses, thyromegaly, or abnormal cervical nodes Chest Wall:  no deformities noted Lungs:  clear  bilaterally to auscultation and percussion Heart:  regular rate and rhythm, S1, S2 without murmurs, rubs, gallops, or clicks Abdomen:  bowel sounds positive; abdomen soft and non-tender without masses, or organomegaly Msk:  rt shoulder painful abdouction Pulses:  pulses normal Extremities:  no clubbing, cyanosis, edema, or deformity noted Neurologic:  CN II-XII grossly intact with normal reflexes, coordination, muscle strength and tone Skin:  intact without lesions or rashes Cervical Nodes:  no significant adenopathy Axillary Nodes:  no significant adenopathy Psych:  alert and cooperative; normal mood and affect; normal attention span and concentration   Impression & Recommendations:  Problem # 1:  SARCOIDOSIS (ICD-135) Assessment Improved Fev1 is back to normal and baseline onpred 10mg  and methotrexate since starting them in June 2011. Continues to be asymptomatic. HAving some steroid side effects (Weight gain, myalgia). So, will redcue pred to 5mg  per day but continue methtorexate at 10mg  per week. Conitnue bactrim and folic acid. ROV 4 months.Cleda Daub at fu  Problem #  2:  ENCOUNTER FOR THERAPEUTIC DRUG MONITORING (ICD-V58.83) Assessment: Unchanged check labs on methotrexate and prednisone. Weight gain +. Myalgia + Orders: TLB-CBC Platelet - w/Differential (85025-CBCD) TLB-BMP (Basic Metabolic Panel-BMET) (80048-METABOL) TLB-Hepatic/Liver Function Pnl (80076-HEPATIC) TLB-A1C / Hgb A1C (Glycohemoglobin) (83036-A1C) Est. 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 Patient Level IV (60454)  Medications Added to Medication List This Visit: 1)  Prednisone 10 Mg Tabs (Prednisone) .... Take 1 tablet by mouth once a day 2)  Topamax 25 Mg Tabs (Topiramate) .... Take 1 tablet by mouth two times a day 3)  Calcium 500 Mg Tabs (Calcium) .... Take 1 tablet by mouth once a day 4)  Vitamin  D3 1000 Unit Tabs (Cholecalciferol) .... Take 1 tablet by mouth once a day 5)  Iron 325 (65 Fe) Mg Tabs (Ferrous sulfate) .... Take 1 tablet by mouth once a day  Patient Instructions: 1)  #SARCOID 2)  your breathing test shows improvement to baseline 3)  continue prednisone but cut down to 5mg   per day 4)  continue methotrexate 10mg  per week 5)  continue bactrim as before 6)  continue folic acid as before 7)  return in 4 months 8)  we will do spirometry at next visit 9)  will consider cxr at followup 10)  #VITAMIN D 11)  - contnue vit d3 at 1000 units per dy with calcium 12)  #HEALTH 13)  - have flu shot asap

## 2011-01-26 NOTE — Assessment & Plan Note (Signed)
Summary: rov/jd   Visit Type:  Follow-up Copy to:  Dr. Janace Hoard Primary Provider/Referring Provider:  Dr. Janace [facility], DR. Hyacinth Meeker at [facility] in [facility], Dr. Dione Booze at [facility] eye  CC:  Followup with PFT's.  Pt states that her breathing has been fine.  She c/o cough x 3 wks- prod in the am with clear/white sputum.  Throughout the day cough is dry and hacky- coughs sometimes to the point she vomits..  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 Since last visit several months ago she has developed NEW cough for past 3 weeks. It is  dry and moderate in severity. Present day and night. Asscited with gagging and vomitus of clear content. Denies post nasal drip, gerd or hemoptysis. Denies fevers, B symptoms, joint pains, palpitations, ocular symptoms or dermal symptoms and headaches. . In fact the isolated lupus pernio in her nose is improving. REviewd PFTs. At time of start of pred/mtx in June 2011 she only had mild Restrction (TLC 75%, DLCO 77%) . This had normalized in Sept 2010 when she went down to [facility] for 2nd opion. AFter that methotrexate was stopped and she weaned hte prednisone down to 5mg  per day. Today, the restiction is worse than 1 year ago and is moderate in severity (TLC 60%, DLCO 63%) . CXR from 01/28/2010 shows progressive pulmonary diseae in my opinion.    Current Medications (verified): 1)  Diamox 250 Mg .... Take 1 Tablet By Mouth Two Times A Day 2)  Motrin Ib 200 Mg Tabs (Ibuprofen) .... As Directed 3)  Prednisone 5 Mg Tabs (Prednisone) .Marland Kitchen.. 1 Every Am  Allergies (verified): No Known Drug Allergies  Past History:  Past Medical History: UPDATED 07/09/2009 #Pseudotumor Cerebri ........Marland KitchenDr Hyacinth Meeker and Dr. Dione Booze -> dxed Jan 2009, on diamox -> LP 01/24/2008 at [facility] - Normal ( pressure 14cm,  gluc 68, Protein 23, Clear, colorless, RBC 3, WBC - too few to count) -> CT head non contrast done at [facility] 11/25/2007: "normal". Ddoes not report MRI, MRA, MRV being done > 2nd opinon by Dr. Pearlean Brownie - reportdly had MRI/MRV both normal; followup pending  #LUPUS PERNIO/SARCOIDOSIS -> skin bx confirmed at [facility] 05/07/2009 (personally reviewed) >looks improved 07/09/2009 -picture taken #STage 2 pulmonary sarcoidosois.............Lavinia Sharps -> CT chest 05/13/2009. -> bx confirmed by EBUS and [facility] T[facility]x on 05/27/2009 >Commence prednisone/methotrexate/folic acid/bactrim 06/02/2009 >PFT 06/07/2009: Fev1 2.2L/77%, FVC 2.68L/77%, TLC 3.8L/75%, DLCO 19/77% > July 2010: asymptomatic but cxr without change > Cleda Daub 09/03/2009 at [facility] - ->  fev1 2.3L/ FVC 89%, 2.84L/90%.  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  These include a 1.3   cm lesion in the medial segment left hepatic lobe on image 43 of   series 2; a 1.0 cm lesion of the lateral segment left hepatic lobe   on image 46 of series 2; a 1.7 x 1.4 cm lesion of the right hepatic   lobe on image 53 of series 2 with associated adjacent transit   hepatic attenuation difference; and several additional visualized   small hypodense liver lesions.  An enlarged portacaval node   measures 3.6 x 1.4  cm on image 54 series 2. #PPD negative - 04/2009  Family History: Reviewed history from 05/12/2009 and no changes required. Allergies-mother No sarcoid, asthma,   Social History: Reviewed history from 05/12/2009 and no changes required. 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 Smoked for 17 years before that - 4 cig/day  Review of Systems       The patient complains of  productive cough and non-productive cough.  The patient denies shortness of breath with activity, shortness of breath at rest, coughing up blood, chest pain, irregular heartbeats, acid heartburn, indigestion, loss of appetite, weight change, abdominal pain, difficulty swallowing, sore throat, tooth/dental problems, headaches, nasal congestion/difficulty breathing through nose, sneezing, itching, ear ache, anxiety, depression, hand/feet swelling, joint stiffness or pain, rash, change in color of mucus, and fever.    Vital Signs:  Patient profile:   40 year old female Height:      64 inches Weight:      177 pounds BMI:     30.49 O2 Sat:      97 % on Room air Temp:     98.5 degrees F oral Pulse rate:   123 / minute BP sitting:   106 / 66  (left arm)  Vitals Entered By: Vernie Murders (June 13, 2010 9:42 AM)  O2 Flow:  Room air  Physical Exam  General:  well developed, well nourished, in no acute distress.Pleasant female Head:  normocephalic and atraumatic Eyes:  PERRLA/EOM intact; conjunctiva and sclera clear Ears:  TMs intact and clear with normal canals Nose:  flat scab lupus pernio in right nasal bridge; looks almost resolved Mouth:  no deformity or lesions Neck:  no masses, thyromegaly, or abnormal cervical nodes Chest Wall:  no deformities noted Lungs:  clear bilaterally to auscultation and percussion Heart:  regular rate and rhythm, S1, S2 without murmurs, rubs, gallops, or clicks Abdomen:  bowel sounds positive; abdomen soft and non-tender without masses, or organomegaly Msk:  rt shoulder painful abdouction Pulses:  pulses normal Extremities:  no clubbing, cyanosis, edema, or deformity noted Neurologic:  CN II-XII grossly intact with normal reflexes, coordination, muscle strength and tone Skin:  intact without lesions or rashes Cervical Nodes:  no significant adenopathy Axillary Nodes:  no significant adenopathy Psych:  alert and cooperative; normal mood and affect; normal  attention span and concentration   MISC. Report  Procedure date:  06/13/2010  Findings:      REviewd PFTs. At time of start of pred/mtx in June 2011 she only had mild Restrction (TLC 75%, DLCO 77%) . This had normalized in Sept 2010 when she went down to [facility] for 2nd opion. AFter that methotrexate was stopped and she weaned hte prednisone down to 5mg  per day. Today, the restiction is worse than 1 year ago and is moderate in severity (TLC 60%, DLCO 63%) .   Pulmonary Function Test Date: 06/13/2010 Height (in.): 64 Gender: Female  Pre-Spirometry FVC    Value: 2.01 L/min   Pred: 3.64 L/min     % Pred: 55 % FEV1    Value: 1.56 L     Pred: 2.86 L     % Pred: 54 % FEV1/FVC  Value: 77 %     Pred: 78 %     % Pred: - % FEF 25-75  Value: 1.56 L/min   Pred: 3.31 L/min     % Pred: 47 %  Post-Spirometry FVC    Value: 2.12 L/min   Pred: 3.64  L/min     % Pred: 58 % FEV1    Value: 1.75 L     Pred: 2.86 L     % Pred: 61 % FEV1/FVC  Value: 83 %     Pred: 78 %     % Pred: - % FEF 25-75  Value: 2.03 L/min   Pred: 3.31 L/min     % Pred: 61 %  Lung Volumes TLC    Value: 3.08 L   % Pred: 60 % RV    Value: 1.06 L   % Pred: 65 % DLCO    Value: 15.9 %   % Pred: 63 % DLCO/VA  Value: 6.40 %   % Pred: 149 %  Comments: restriction  Impression & Recommendations:  Problem # 1:  SARCOIDOSIS (ICD-135) Assessment Deteriorated Since coming off methotrexate in sept 2010 and weaning prednisine downt to 5mg  per day, I think her sarcoidosis has progressed as evicencd by cxr getting worse in feb 2011 and now she is symptomatic associated wiuth worsening restriction on PFTs.  We need to recommence immunomodulating Rx in her. Her BMI of 30 poses risk for further weight gain and resultant worsening of pseudotumor cerebrrii and possibly diabetes if we did a steroid alone approach. TNF-alpha blockade is a steroid sparing Rx option but has high risk for infectious complications and is expensive and still not fully  validated in studies. Therefrore, we opted to do the previous steroid/mehtotrexate approach. She has tolerated prednisone 10mg  per day without problems. Therefore goal is to give her stable disease, asymptomatic state and hopefully normal PFTs wiht steroid/methhtorexate. She is fully aware of all the side effects of both drugs and is willing to take hte risk. Will do bseline blood tests. Wil give folic acid and bactrim for prophylaxis. Will see her in 6 weeks woith PFTs  Medications Added to Medication List This Visit: 1)  Prednisone 5 Mg Tabs (Prednisone) .Marland Kitchen.. 1 every am 2)  Prednisone 10 Mg Tabs (Prednisone) .... Take 4 tablets daily x 2 weeks, then 3 tablets x 2 weeks, then 1 tablet daily to continue 3)  Folic Acid 800 Mcg Tabs (Folic acid) .... Take 1 tablet bid 4)  Bactrim Ds 800-160 Mg Tabs (Sulfamethoxazole-trimethoprim) .... Take 1 tab mon, wed, friday 5)  Methotrexate 2.5 Mg Tabs (Methotrexate sodium) .... Take 2 tablet every monday x 1 week tkae 3 tablets every monday x 1 week then take 4 tabs every monday x continue 6)  Prednisone 10 Mg Tabs (Prednisone) .... Take 4 tablets per day x 2 weeks, then 2 tablets per day x 2 weeks 7)  Prednisone 10 Mg Tabs (Prednisone) .... Take 1 tablet per day starting July 13, 2010  Other Orders: TLB-BMP (Basic Metabolic Panel-BMET) (80048-METABOL) TLB-CBC Platelet - w/Differential (85025-CBCD) TLB-Hepatic/Liver Function Pnl (80076-HEPATIC) Pulmonary Referral (Pulmonary)  Patient Instructions: 1)  sarcoid appears worse 2)  have blood test today 3)  please increase prednisone as directed 4)  > 40mg  per day x 2 weeks 5)  > 20mg  per day x 2 weeks 6)  > 10mg  per day  x to continue 7)  restart methotrexate  8)   > every monday 2.5mg  tab x 2 tabs per week 9)  > next monday 2.5mg  x 3 tabs 10)  > Following monday 2.5mg  x 4 tabs and continue every monday 11)  start folic acid 1 tablet two times a day  12)  start DS bactrim as before 1 tab  mon-wed-friday 13)  return to  see me in 6 weeks 14)  NOTE: your pharmacy has gotten 3 prednisone scripts. The last 2 are the correct one. THe first one is wrong one. Please ensure they dispense the correct one. WE will call them as well Prescriptions: PREDNISONE 10 MG  TABS (PREDNISONE) take 1 tablet per day starting July 13, 2010  #30 x 1   Entered and Authorized by:   Kalman Shan MD   Signed by:   Kalman Shan MD on 06/13/2010   Method used:   Electronically to        [facility] [address] [phone]* (retail)       [address]       [place], [place]  [address]       Ph: [phone]       Fax: [phone]   RxID:   [id number] PREDNISONE 10 MG  TABS (PREDNISONE) take 4 tablets per day x 2 weeks, then 2 tablets per day x 2 weeks  #105 x 0   Entered and Authorized by:   Kalman Shan MD   Signed by:   Kalman Shan MD on 06/13/2010   Method used:   Electronically to        [facility] [address] [phone]* (retail)       [address]       [place], [place]  [address]       Ph: [phone]       Fax: [phone]   RxID:   [id number] METHOTREXATE 2.5 MG TABS (METHOTREXATE SODIUM) take 2 tablet every monday x 1 week tkae 3 tablets every monday x 1 week then take 4 tabs every monday x continue  #16 x 6   Entered and Authorized by:   Kalman Shan MD   Signed by:   Kalman Shan MD on 06/13/2010   Method used:   Electronically to        [facility] [address] #[id number]* (retail)       [address]       [place], [place]  [address]       Ph: [phone]       Fax: [phone]   RxID:   [id number] BACTRIM DS 800-160 MG TABS (SULFAMETHOXAZOLE-TRIMETHOPRIM) take 1 tab mon, wed, friday  #12 x 3   Entered and Authorized by:   Kalman Shan MD   Signed by:   Kalman Shan MD on 06/13/2010   Method used:   Electronically to        [facility]  [address] #[id number]* (retail)       [address]       [place], [place]  [address]       Ph: [phone]       Fax: [phone]   RxID:   [id number] FOLIC ACID 800 MCG TABS (FOLIC ACID) take 1 tablet bid  #60 x 6   Entered and Authorized by:   Kalman Shan MD   Signed by:   Kalman Shan MD on 06/13/2010   Method used:   Electronically to        [facility] [address] #[id number]* (retail)       [address]       [place], [place]  [address]       Ph: [phone]       Fax: [phone]   RxID:   [id number] PREDNISONE 10 MG  TABS (PREDNISONE) take 4 tablets daily x 2 weeks, then 3 tablets x 2 weeks, then 1 tablet daily to continue  #120 x 1   Entered and Authorized by:   Kalman Shan MD   Signed by:   Kalman Shan MD on 06/13/2010   Method used:   Electronically to        [facility] [address] #[id number]* (retail)       [address]       [place], [place]  [address]       Ph: [phone]       Fax: [phone]   RxID:   [id number]

## 2011-01-26 NOTE — Letter (Signed)
Summary: [facility]  [facility]   Imported By: Lester Verdigris 02/10/2010 07:48:53  _____________________________________________________________________  External Attachment:    Type:   Image     Comment:   External Document

## 2011-01-26 NOTE — Letter (Signed)
Summary: [facility]  [facility]   Imported By: Lester Sayre 02/10/2010 07:50:25  _____________________________________________________________________  External Attachment:    Type:   Image     Comment:   External Document

## 2011-01-26 NOTE — Assessment & Plan Note (Signed)
Summary: follow/up//jrc   Visit Type:  Follow-up Copy to:  Dr. Janace Hoard Primary Provider/Referring Provider:  Dr. Janace Hoard [facility], DR. Hyacinth Meeker at [facility] in [facility], Dr. Dione Booze at [facility] eye  CC:  Pt here for foll0w-up. tp states breathing is doing okay. Pt c/o pain in right shoulder and bursitis. 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 Thenm palpitations (fast heart rate and missed beats) x 1 month , Cough x 1 month. Cough is dry. Night cough > Day cough. Drinking water or hot tea makes it better. No specific relieiving factors for cough.   Also, tired and weak for 2 weeks. Initially thought it was due to chronic anemia (hgb 9s usually). Dyspneic for 1 week only. Noticed it while shopping. Brought on by exertion - walking, cleaning up, activities, 1 flight of stairs. Dyspnea relieved by rest. A week ago she believes she could have climbbed 5 flight of stairs. No associated wheezing, chest pain, syncope.   Due to above went to urgent care 1 months ago. Referred to dermatology [facility]. Bx done 5/14. I called for these outside recoreds and the bx shows noncaseating granuloma c/w sarcoidosis. ; results pending. Same day 5/14 went to urgent care due to new dyspnea. Did CXR and it showed RUL infiltrae, LUL infiltreate c/w sarcoid. So, referred here. PLAN: CT scan. Bronch, EBUS, biopsy   05/27/2009: Bx confirmed sarcoid on EBUS/MEd. OV 06/02/2009: : start prednisone 50mg /methotrexate/folic acid/bactrim for multistystem sarcoid OV 07/09/2009: Followup multisystem sarcoidosis after starting prednisone/mtx Rx 06/02/2009. Dyspnea and cough have resolved. SHe is able to walk the mall and do activities without dyspnea. The lupus pernio in the right nasal bridge has improved and is now 'flat'. Because it  is 'flat' she is not planning to follow wiht the dermatologist. I took a picture of this and have emailed her so we can document the progress. She is now also awaiting a 2nd opinion from Dr. Cyndy Freeze at [facility] in september 2010.  However, she is reporting singificant side effect with prednisone and wants prednisone dose cutdown. PRincipally she is having weight gain and this making her headaches worse due to her pseudotumor cerebrii (2nd opinion about this diganosis is in progress with Dr. Pearlean Brownie, MRI/MRV done but result not known to me yet). She is also feeling dizzy, sweats, left knee pain, both angkle pain and feels her head is soaking with sweat. She is compliant wiht meds. CXR shows improved pulmonary sarcoidosis. REC: cut down prednisone to 30mg  daily, continue methotrexate and folic acid. 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  Since last visit, she had slighly high sugars in August 2010. So, we cut her prednisone down to 20mg  daily. She saw Dr Leta Speller at [facility] for sarcoid  10 days ago. Advised to come off methotrexate, folic acid, and bactrim. I do not have his letter but he reportedly has recommended TNF-alpha blockade. In addition, she is reporting new onset palpitation 2 weeks ago. Random onset. Occurs predmoninantly at night or early in the day soon after getting up. Was taking 2 coffee & 2 cans of coke daily. Since quitting them 10 days ago, palpitations 50% better. Worsened and relieved by caffeine. No associaed dyspnea,  chest pain, cough, syncope. She is aware that could represent cardiac sarcoidosis but is following Dr. Leta Speller advice of staying off caffeine initially. 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 She saw Dr. Azzie Roup 11/12/2009  for evlauation of TNF-alpha blockae based on recommendations from Dr Leta Speller in [facility]. However, it ws decided that based in the fact she ws asymptomatic on prdnisone 20mg  and lupus pernio was minimal that they would not pursue TNF-alpha blockade. Currently on prednisone 10mg  daily.   Since last visit she is not monitoring sugars. Palpitations have resolved after she discontinued caffeine products. She continues to be asymptomatic from sarcoidosis. Original cough has resolved. Mild dyspnea with undue exertion persists as before. Lupus pernio has nearly resolved. Of note, she has developed new rt shoulder pain for last few months that is persistent and severe. She saw doctor in urgent care. Now seeing ortho who has told her it is bursitis, frozen shoulder. Of note the urgnt care, shoulder xray per report suggeste rib invasion of the pulmonary lesions. The radiologist there was concerned that she had lung cancer.   Current Medications (verified): 1)  Diamox 250 Mg .... Take 1 Tablet By Mouth Two Times A Day 2)  Motrin Ib 200 Mg Tabs (Ibuprofen) .... As Directed 3)  Prednisone 10 Mg Tabs (Prednisone) .... Take 1 Tablet By Mouth Once A Day 4)  Diclofenac Sodium 75 Mg Tbec (Diclofenac Sodium) .... Take 1 Tablet By Mouth Once A Day 5)  Hydrocodone-Acetaminophen 5-500 Mg Tabs (Hydrocodone-Acetaminophen) .... As Needed  Allergies (verified): No Known Drug Allergies  Past History:  Past Medical History: Last updated: 07/09/2009 UPDATED 07/09/2009 #Pseudotumor Cerebri ........Alexandra KitchenDr Hyacinth Meeker and Dr. Dione Booze -> dxed Jan 2009, on diamox -> LP 01/24/2008 at [facility] - Normal ( pressure 14cm, gluc 68, Protein 23, Clear, colorless, RBC 3, WBC - too few to count) -> CT head non contrast done at [facility] 11/25/2007: "normal". Ddoes not report MRI, MRA, MRV being done > 2nd opinon by Dr. Pearlean Brownie - reportdly had MRI/MRV both normal; followup pending  #LUPUS PERNIO/SARCOIDOSIS -> skin bx confirmed at [facility]  Dermatology 05/07/2009 (personally reviewed) >looks improved 07/09/2009 -picture taken #STage 2 pulmonary sarcoidosois.............M Kamal Jurgens -> CT chest 05/13/2009. 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  These include a 1.3   cm lesion in the medial segment left hepatic lobe on image 43 of   series 2; a 1.0 cm lesion of the lateral segment left hepatic lobe   on image 46 of series 2; a 1.7 x 1.4 cm lesion of the right hepatic   lobe on image 53 of series 2 with associated adjacent transit   hepatic attenuation difference; and several additional visualized   small hypodense liver lesions.  An enlarged portacaval node   measures 3.6 x 1.4 cm on image 54 series 2. 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 Smoked for 17 years before that -  4 cig/day  Family History: Reviewed history from 05/12/2009 and no changes required. Allergies-mother No sarcoid, asthma,   Social History: Reviewed history from 05/12/2009 and no changes required. 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 Smoked for 17 years before that - 4 cig/day  Review of Systems  The patient denies anorexia, fever, weight loss, weight gain, vision loss, decreased hearing, hoarseness, chest pain, syncope, dyspnea on exertion, peripheral edema, prolonged cough, headaches,  hemoptysis, abdominal pain, melena, hematochezia, severe indigestion/heartburn, hematuria, incontinence, genital sores, muscle weakness, suspicious skin lesions, transient blindness, difficulty walking, depression, unusual weight change, abnormal bleeding, enlarged lymph nodes, angioedema, breast masses, and testicular masses.         shuldre pain R midl dyspnea  Vital Signs:  Patient profile:   40 year old female Height:      64 inches Weight:      204.13 pounds O2 Sat:      97 % on Room air Temp:     98.6 degrees F oral Pulse rate:   124 / minute BP sitting:   126 / 78  (right arm) Cuff size:   regular  Vitals Entered By: Carron Curie CMA (January 28, 2010 4:03 PM)  O2 Flow:  Room air CC: Pt here for foll0w-up. tp states breathing is doing okay. Pt c/o pain in right shoulder and bursitis.  Comments Medications reviewed with patient Carron Curie CMA  January 28, 2010 4:06 PM Daytime phone number verified with patient.    Physical Exam  General:  well developed, well nourished, in no acute distress.Pleasant female Head:  normocephalic and atraumatic Eyes:  PERRLA/EOM intact; conjunctiva and sclera clear Ears:  TMs intact and clear with normal canals Nose:  flat scab lupus pernio in right nasal bridge; looks almost resolved Mouth:  no deformity or lesions Neck:  no masses, thyromegaly, or abnormal cervical nodes Chest Wall:  no deformities noted Lungs:  clear bilaterally to auscultation and percussion Heart:  regular rate and rhythm, S1, S2 without murmurs, rubs, gallops, or clicks Abdomen:  bowel sounds positive; abdomen soft and non-tender without masses, or organomegaly Msk:  rt shoulder painful abdouction Pulses:  pulses normal Extremities:  no clubbing, cyanosis, edema, or deformity noted Neurologic:  CN II-XII grossly intact with normal reflexes, coordination, muscle strength and tone Skin:  intact without lesions or rashes Cervical Nodes:  no significant  adenopathy Axillary Nodes:  no significant adenopathy Psych:  alert and cooperative; normal mood and affect; normal attention span and concentration   CXR  Procedure date:  01/28/2010  Findings:      RUL Mass and Left side nodular lesions. Same as before. Formal report pending. I personally reviewd film  Impression & Recommendations:  Problem # 1:  PALPITATIONS (ICD-785.1) Assessment Improved  resolved after stopping caffeine  plan reassure  monitor if recurs, then investigate for cardiac sarcoid  Orders: Est. Patient Level III (78295)  Problem # 2:  ENCOUNTER FOR THERAPEUTIC DRUG MONITORING (ICD-V58.83) Assessment: Unchanged  sugars 97 mg% at office.  Per patient rt shoulder issues not related to avascular necrosis.  no weight gain no headache no major side effects  plan monitor  Orders: Est. Patient Level III (62130)  Problem # 3:  SARCOIDOSIS (ICD-135) Assessment: Unchanged appears stable on cxr. formal report pending. Asymptomatic  plan cut prednisone down to 5mg  daily rov 4 months with pft await formal report to see for rib destruction  Medications Added to Medication List This  Visit: 1)  Diamox 250 Mg  .... Take 1 tablet by mouth two times a day 2)  Prednisone 10 Mg Tabs (Prednisone) .... Take 1 tablet by mouth once a day 3)  Diclofenac Sodium 75 Mg Tbec (Diclofenac sodium) .... Take 1 tablet by mouth once a day 4)  Hydrocodone-acetaminophen 5-500 Mg Tabs (Hydrocodone-acetaminophen) .... As needed  Other Orders: T-2 View CXR (71020TC)  Patient Instructions: 1)  Cut your prednisone down to 5mg  by mouth daily 2)  return in 4 months  3)  have full PFTs before you see me 4)  come sooner if you have cough, shortness of breath worsening or any concern 5)  I am glad you are feeling better 6)  Take care of your shoulder 7)  Sign release to get your orthopedic records from your orthopod   Immunization History:  Influenza Immunization History:     Influenza:  fluvax 3+ (09/29/2009)

## 2011-01-26 NOTE — Progress Notes (Signed)
Summary: exposed to whooping cough wants to know if there are any percaus  Phone Note Call from Patient   Caller: Patient Call For: dr Marchelle Gearing Summary of Call: Patient works for the [employer] and one of the children there has been diagnosised with Whooping cough and last night she began to cough. Patient has an appointment on Friday with Dr Marchelle Gearing but wants to know if she should start an antibiotic now or if there are any other measures that she should be taking.  She can be reached at [phone]  Initial call taken by: Vedia Coffer,  January 18, 2011 1:42 PM  Follow-up for Phone Call        MR, pls advise thanks Vernie Murders  January 18, 2011 3:03 PM   Additional Follow-up for Phone Call Additional follow up Details #1::        when was her last diphtheria vaccine ? has she had a booster ? Additional Follow-up by: Kalman Shan MD,  January 18, 2011 4:24 PM    Additional Follow-up for Phone Call Additional follow up Details #2::    Spoke with pt.  She states that she does not remember when or if she had vaccine.  She also does not remember where this would have been done at so can not call anywhere for records.  She has ov Friday. Should I just tell her to keep this appt and we can give TDAP when she comes in? Pls advise thanks! Follow-up by: Vernie Murders,  January 18, 2011 4:33 PM  Additional Follow-up for Phone Call Additional follow up Details #3:: Details for Additional Follow-up Action Taken: 1) For adults aged 38 to 70 years, the [place] Advisory Committee on [employer] (ACIP) recommends a single booster dose of Tdap. So, please give Tdap when she comes. Please make sure the brand is BOOSTRIX or ADACEL. It has to be Tdap and not anything else like Dtap  2) Give her five-day course of azithromycin (500 mg day one and 250 mg days two through five) or clarithromycin (500 mg twice daily for seven days).   3) Tell hre that  Patients with B. pertussis  infection should avoid contact with young children and infants until they have completed at least five days of antibiotics. Similarly, patients working in schools, daycare centers, or healthcare facilities should not return to work until after five days of antibiotic treatment.    Additional Follow-up by: Kalman Shan MD,  January 18, 2011 4:49 PM  New/Updated Medications: ZITHROMAX Z-PAK 250 MG TABS (AZITHROMYCIN) take as directed Prescriptions: ZITHROMAX Z-PAK 250 MG TABS (AZITHROMYCIN) take as directed  #1 x 0   Entered by:   Vernie Murders   Authorized by:   Kalman Shan MD   Signed by:   Vernie Murders on 01/18/2011   Method used:   Electronically to        [facility] [address] [id number]* (retail)       [address]       [place], [place]  [address]       Ph: [phone]       Fax: [phone]   RxID:   [id number]  Spoke with pt and notified of the above recs per MR.  Pt verbalized understanding.  She states that she will need work note and will obtain this at ov on Friday. Zpack was sent to pharm. Vernie Murders  January 18, 2011 5:24 PM

## 2011-01-26 NOTE — Progress Notes (Signed)
Summary: ? pred rx   Phone Note From Pharmacy   Caller: [facility] [address] [phone] Call For: ramaswamy  Summary of Call: [facility] calling about prednisone rx.  She has questions about mg and quanity. Initial call taken by: Lehman Prom,  June 13, 2010 12:20 PM  Follow-up for Phone Call        [facility], spoke with [place].  Per Kathie Rhodes 3 prednisone rxs were sent today.  ? which one pt needs.  Will forward message to MR-pls advise on which prednisone rx is correct.  thanks! Crystal Jones RN  June 13, 2010 1:05 PM  Per MR, pt needs to take 40mg  x2wks, then 20mg  x 2wks, then 10mg  and stay.    [facility]ed [facility] back.  Spoke with [employer].  Informed her of this.  She verbalized understanding. Gweneth Dimitri RN  June 13, 2010 1:12 PM

## 2011-01-27 NOTE — Letter (Signed)
Summary: [facility]  [facility]   Imported By: Sherian Rein 02/01/2010 13:33:54  _____________________________________________________________________  External Attachment:    Type:   Image     Comment:   External Document

## 2011-01-30 ENCOUNTER — Encounter: Payer: Self-pay | Admitting: Internal Medicine

## 2011-01-30 ENCOUNTER — Other Ambulatory Visit: Payer: Self-pay | Admitting: Cardiology

## 2011-01-30 ENCOUNTER — Institutional Professional Consult (permissible substitution) (INDEPENDENT_AMBULATORY_CARE_PROVIDER_SITE_OTHER): Payer: BC Managed Care – PPO | Admitting: Cardiology

## 2011-01-30 ENCOUNTER — Ambulatory Visit (INDEPENDENT_AMBULATORY_CARE_PROVIDER_SITE_OTHER): Payer: BC Managed Care – PPO

## 2011-01-30 ENCOUNTER — Encounter: Payer: Self-pay | Admitting: Cardiology

## 2011-01-30 DIAGNOSIS — E559 Vitamin D deficiency, unspecified: Secondary | ICD-10-CM

## 2011-01-30 DIAGNOSIS — R002 Palpitations: Secondary | ICD-10-CM

## 2011-01-30 LAB — TSH: TSH: 1.64 u[IU]/mL (ref 0.35–5.50)

## 2011-02-01 NOTE — Assessment & Plan Note (Signed)
Summary: 1 year return/mhh   Visit Type:  Follow-up Copy to:  Dr. Janace Hoard Primary Provider/Referring Provider:  Dr. Janace Hoard [facility], DR. Hyacinth Meeker at [facility] in [facility], Dr. Dione Booze at [facility] eye  CC:  Pt here for follow-up. Pt states cough has mostly gone. Pt has no new complaints at this time. 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 Progressive disease FEb-June 2011 with cough and chnge in PFt. Restart methotrexate and prednisone June 2011 with normlization spirometry Sept 2011.   January 20, 2011: Followup for above. Last visit in Sept 2011 due to  normalization of spirometry but also waeight gain and myalgia  I advised cuttting down prdnisone to 5mg  per day but continjued methotrexate with bactrim and folic acid prophylaxis. She is not sure if she is taking prednisone at 5mg  per day or 10mg  per day but she believes it is the former. She maintaints she is compliant (although when called later on 1/30 she admitted to only 50% compliance) SHe maintains she is commpliant wit all medications. Denies side effects of rx except we note continued weight gain. Denies palpiations but we note tachycardia (HR 130 and revie of records show HR > 120s gooing back as far as sept 2010 atleast).  New issue is exposed to children with pertusssis at school ([place] in [place] On 1/24 developed cough. We called in 5 day zpak and return to school only after 01/23/2011. Now on d2 zpak and feeling better with improved cough.   Lab reivw today shows CXR - worsening sarcoidosis. Spirometry shows worsening obstruction and EKG shows sinus tachycardia (note poor compliance hx +)   Preventive Screening-Counseling & Management  Alcohol-Tobacco     Smoking Status: quit     Packs/Day: <0.25     Year Started: 1992     Year Quit:  2009     Pack years: 4.25     Tobacco Counseling: not to resume use of tobacco products  Current Medications (verified): 1)  Diamox 250 Mg .... Take 1 Tablet By Mouth Two Times A Day 2)  Motrin Ib 200 Mg Tabs (Ibuprofen) .... As Directed 3)  Folic Acid 800 Mcg Tabs (Folic Acid) .... Take 1 Tablet By Mouth Two Times A Day 4)  Bactrim Ds 800-160 Mg Tabs (Sulfamethoxazole-Trimethoprim) .... Take 1 Tablet On Monday, Wednesday,  Friday 5)  Prednisone 10 Mg Tabs (Prednisone) .... Take 1 Tablet By Mouth Once A Day 6)  Topamax 25 Mg Tabs (Topiramate) .... Take 1 Tablet By Mouth Two Times A Day 7)  Calcium 500 Mg Tabs (Calcium) .... Take 1 Tablet By Mouth Once A Day 8)  Vitamin D3 1000 Unit Tabs (Cholecalciferol) .... Take 1 Tablet By Mouth Once A Day 9)  Iron 325 (65 Fe) Mg Tabs (Ferrous Sulfate) .... Take 1 Tablet By Mouth Once A Day 10)  Zithromax Z-Pak 250 Mg Tabs (Azithromycin) .... Take As Directed 11)  Methotrexate 2.5 Mg Tabs (Methotrexate Sodium) .... 4 Tablets Every Monday  Allergies (verified): No Known Drug Allergies  Past History:  Past medical, surgical, family and social histories (including risk factors) reviewed, and no changes noted (except as noted below).  Past Medical History: U#Pseudotumor Cerebri ........Marland KitchenDr Hyacinth Meeker and Dr. Dione Booze  - dxed Jan 2009, on diamox  - LP 01/24/2008 at [facility] - Normal ( pressure 14cm, gluc 68, Protein 23, Clear,  colorless, RBC 3, WBC - too few to count) - CT head non contrast done at [facility] 11/25/2007: "normal". Ddoes not report MRI, MRA, MRV being done  - 2nd opinon by Dr. Pearlean Brownie - reportdly had MRI/MRV both normal; followup pending  #LUPUS PERNIO/SARCOIDOSIS -skin bx confirmed at [facility] 05/07/2009 (personally reviewed)  -looks improved 07/09/2009 -  #STage 2 pulmonary sarcoidosois.............Lavinia Sharps - CT chest 05/13/2009. -> bx confirmed by EBUS and RUL TBBx on 05/27/2009  -Commence prednisone/methotrexate/folic  acid/bactrim 06/02/2009  -PFT 06/07/2009: Fev1 2.2L/77%, FVC 2.68L/77%, TLC 3.8L/75%, DLCO 19/77%  - July 2010: asymptomatic but cxr without change  -Spiro 09/03/2009 at [facility] - ->  fev1 2.3L/ FVC 89%, 2.84L/90%.  Ratio 83 -> stop mtx, wean pred  - CXR 01/28/2010 -> progressive diseae -> continue pred at 5mg   - PFT 06/13/2010 on pred 5mg  - fev1 1.6L/54%, FVC 2.0L/55%, Ratio 77% , TLC 60%, DLCO 16/63% -> restart pred/mtx  Cleda Daub 07/25/2010 on pred 10/mtx 10 -> FEv1 1.84L/775%, FVC 2.25L  - SPiro 09/06/2010 -> Fev1 2.14L/81%. Cut pred to 5mg  per day. Cont mtx  - SPiro 01/20/2011 - Fev1 1.6L/61%, Ratio 74%. CXR Worse -> increased pred to 10mg  perday. Cont Mtx.   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  - Jan 2012: 11   Past Surgical History: Reviewed history from 05/12/2009 and no changes required. Removal of Left Ovary-2004 Removalof right ovary and fallopian tubes 2009  Family History: Reviewed history from 05/12/2009 and no changes required. Allergies-mother No sarcoid, asthma,   Social History: Reviewed history from 05/12/2009 and no changes required. 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 Smoked for 17 years before that - 4 cig/day  Review of Systems  The patient denies anorexia, fever, weight loss, weight gain, vision loss, decreased hearing, hoarseness, chest pain, syncope, dyspnea on exertion, peripheral edema, prolonged cough, headaches, hemoptysis, abdominal pain, melena, hematochezia, severe indigestion/heartburn, hematuria, incontinence, genital sores, muscle weakness, suspicious skin lesions, transient blindness, difficulty walking, depression, unusual weight change, abnormal bleeding, enlarged lymph nodes, angioedema, breast masses, and testicular masses.    Vital Signs:  Patient profile:   40 year old female Height:      64 inches Weight:       208.50 pounds BMI:     35.92 O2 Sat:      100 % on Room air Temp:     99.4 degrees F oral Pulse rate:   138 / minute BP sitting:   138 / 72  (right arm) Cuff size:   large  Vitals Entered By: Carron Curie CMA (January 20, 2011 4:35 PM)  O2 Flow:  Room air CC: Pt here for follow-up. Pt states cough has mostly gone. Pt has no new complaints at this time.  Comments Medications reviewed with patient Carron Curie CMA  January 20, 2011 4:35 PM Daytime phone number verified with patient.    Physical Exam  General:  well developed, well nourished, in no acute distress.Pleasant female. Has gained weight Head:  normocephalic and atraumatic Eyes:  PERRLA/EOM intact; conjunctiva and sclera clear Ears:  TMs intact and clear with normal canals Nose:  flat scab lupus pernio in right nasal bridge; looks almost resolved Mouth:  no deformity or lesions Neck:  no masses, thyromegaly, or abnormal cervical nodes Chest Wall:  no deformities noted Lungs:  clear bilaterally to auscultation and percussion Heart:  regular  rhythm, S1, S2 without murmurs, rubs, gallops, or clicks TACHYCARDIC + Abdomen:  bowel sounds positive; abdomen soft and non-tender without masses, or organomegaly Msk:  rt shoulder painful abdouction Pulses:  pulses normal Extremities:  no clubbing, cyanosis, edema, or deformity noted Neurologic:  CN II-XII grossly intact with normal reflexes, coordination, muscle strength and tone Skin:  intact without lesions or rashes Cervical Nodes:  no significant adenopathy Axillary Nodes:  no significant adenopathy Psych:  alert and cooperative; normal mood and affect; normal attention span and concentration   MISC. Report  Procedure date:  01/20/2011  Findings:      Lab reivw today shows CXR - worsening sarcoidosis. Spirometry shows worsening obstruction and EKG shows sinus tachycardia  Comments:      independently reviwed  MISC. Report  Procedure date:   01/20/2011  Findings:      vit d leverl - very low  MISC. Report  Procedure date:  01/20/2011  Findings:      normal cbc and chemistries  Impression & Recommendations:  Problem # 1:  SARCOIDOSIS (ICD-135) Assessment Deteriorated There is cxr and spirometry worsening of sarcoidosis after we cut down prednisone to 5mg  per day in Sept 2011. She is requestinga 2nd opinion and I wil have her see Dr. Donnie Aho at [facility]  However, I think the real reason for progression is probably poor compliance with prednisone. She has admitted to 50% compliance rate only. I have advised her that maintainng compliance is real important. For now she has agreed to continue methotrexate, but increase pred to 10mg  per day and maintain compliance.  We will decide of further Rx like tapering steroids or TNF-alpha blockade depending on course, 2nd opinion at [facility] and compliance wiht Rx. Also, continue bactrim and folic acid   Problem # 2:  UNSPECIFIED TACHYCARDIA (ICD-785.0) Assessment: Unchanged In sept  2010 she had palpitaitons but this resolved after stopping caffeine. However, review shows that she has persistent sinus tachycardia of HR 120-130 since atleast sept 2010 at all clinic viists. I wil send her to cardiology for evalution of same and ruling out cardiac sarcoid Orders: Cardiology Referral (Cardiology)  Problem # 3:  UNSPECIFIED VITAMIN D DEFICIENCY (ICD-268.9) Assessment: Unchanged Vit d levels worse despite prescribed replacement since August 2011. Therefore, raises suspicion of poor compliance. Emphasized compliance. Will recheck again in several months  Problem # 4:  PERTUSSIS (ICD-033.9) Assessment: New  exposed to kids in school with pertussis developed cough 1/24 an on 5 day zpak since 1/25. Cough rsolved. Did not get booster 1/27 visit (we forgot)  plan finish zpak return to school only on 1/31 or beyond advised to drp in and get booster pertussis vaccine  Orders: Est. Patient Level V  (91478)  Problem # 5:  ENCOUNTER FOR THERAPEUTIC DRUG MONITORING (ICD-V58.83) Assessment: Unchanged weight gain + but poor compliance. LFTs and sugars are okay and so are cell count  plan monitor Orders: TLB-CBC Platelet - w/Differential (85025-CBCD) TLB-BMP (Basic Metabolic Panel-BMET) (80048-METABOL)  Medications Added to Medication List This Visit: 1)  Methotrexate 2.5 Mg Tabs (Methotrexate sodium) .... 4 tablets every monday  Other Orders: T- * Misc. Laboratory test [phone]) T-2 View CXR (71020TC) TLB-Hepatic/Liver Function Pnl (80076-HEPATIC) Pulmonary Referral (Pulmonary)  Patient Instructions: 1)  your breathing test is worse 2)  pleae have blood work and cxr 3)  based on above I will call you and decide on increasing prednisone 4)  I am sending you to cardiologist for evaluation of fast hear rate 5)  followup depending on result review 6)  i wil make 2nd opinion referral to [name] at [facility] Prescriptions: METHOTREXATE 2.5 MG TABS (METHOTREXATE SODIUM) 4 tablets every monday  #16 x 3   Entered by:   Carron Curie CMA   Authorized by:   Kalman Shan MD   Signed by:   Kalman Shan MD on 01/24/2011   Method used:   Electronically to        [facility] [address] [id number]* (retail)       [address]       [place], [place]  [address]       Ph: [phone]       Fax: [phone]   RxID:   [id number]    Immunization History:  Influenza Immunization History:    Influenza:  historical (09/26/2010)

## 2011-02-01 NOTE — Progress Notes (Signed)
Summary: items  Phone Note Call from Patient   Caller: Alexandra Hood Call For: [name] Summary of Call: was suppose to get tdap shot on fir but she did not get it  Initial call taken by: Oneita Jolly,  January 23, 2011 10:42 AM  Follow-up for Phone Call        spoke to patient  a) we forgot to give her the pertussis vaccine. I have told her to call [name] and swing by and get it. Apologized for inconvenience. [name], pls refer to prior phone note for the exact vaccine  b) WORSENING SARCOID - CXR and PFTS shows it is worse. I questioned her about compliance and she admitted that she is only 50% compliant with prednisone 5mg  per day (also on methotrexate 10mg  weekly). Explained poor compliance should be avoided. I have recommended prednisone 10mg  per day and methotrexate 10mg  per week and continue bactrim/folic acid as before. Pls give ROV 2 months with spirometry. Aslo, referr to [name] (see my email to you) at [facility] for sarcoid  c) VIt D  level low (lower than august levels) but she insists she is compliant with vit dosing. Advised to continue 1000 units daily wihtout fail. COmpliance emphasized  d) tachycardia - ensuer cards fu Follow-up by: Kalman Shan MD,  January 23, 2011 5:50 PM  Additional Follow-up for Phone Call Additional follow up Details #1::        Referral sent to Dr. Donnie Aho. appt is with Dr. [name] 01-30-11 at 12:15. Also spoke wihtpt and she will come by and get tdap when she cna get some time off. I advised her to call ahead of time so I could get it ready for her.  Carron Curie CMA  January 26, 2011 2:05 PM

## 2011-02-08 ENCOUNTER — Telehealth (INDEPENDENT_AMBULATORY_CARE_PROVIDER_SITE_OTHER): Payer: Self-pay | Admitting: *Deleted

## 2011-02-08 ENCOUNTER — Encounter: Payer: Self-pay | Admitting: Physician Assistant

## 2011-02-09 ENCOUNTER — Other Ambulatory Visit: Payer: Self-pay | Admitting: Cardiology

## 2011-02-09 DIAGNOSIS — D8685 Sarcoid myocarditis: Secondary | ICD-10-CM

## 2011-02-09 NOTE — Assessment & Plan Note (Signed)
Summary: np6: dx: palps. per libby from [facility]. notes in emr. pt ...   Visit Type:  np6 Referring Provider:  Dr. Janace Hoard Primary Provider:  Dr. Janace Hoard Urgent Care, DR. Hyacinth Meeker at [facility] in [facility], Dr. Dione Booze at [facility] eye  CC:  palpitations and SOB.  History of Present Illness: 40 year old female with no prior cardiac history for evaluation of palpitations. The patient states that she has had intermittent palpitations since 2010. They are sudden in onset and resolved spontaneously. They're described as her heart racing followed by a skip. There is no associated chest pain, shortness of breath, or syncope. It lasts several seconds. They initially improved after discontinuing caffeine. However she has now resumed her caffeine and they have returned. She also has dyspnea on exertion but there is no orthopnea or PND, pedal edema, chest pain or history of syncope. Because of the above we were asked to further evaluate.  Current Medications (verified): 1)  Diamox 250 Mg .... Take 1 Tablet By Mouth Two Times A Day 2)  Motrin Ib 200 Mg Tabs (Ibuprofen) .... As Directed 3)  Folic Acid 800 Mcg Tabs (Folic Acid) .... Take 1 Tablet By Mouth Two Times A Day 4)  Bactrim Ds 800-160 Mg Tabs (Sulfamethoxazole-Trimethoprim) .... Take 1 Tablet On Monday, Wednesday,  Friday 5)  Prednisone 10 Mg Tabs (Prednisone) .... Take 1 Tablet By Mouth Once A Day 6)  Topamax 25 Mg Tabs (Topiramate) .... Take 1 Tablet By Mouth Two Times A Day 7)  Calcium 500 Mg Tabs (Calcium) .... Take 1 Tablet By Mouth Once A Day 8)  Vitamin D3 1000 Unit Tabs (Cholecalciferol) .... Take 1 Tablet By Mouth Once A Day 9)  Iron 325 (65 Fe) Mg Tabs (Ferrous Sulfate) .... Take 1 Tablet By Mouth Once A Day 10)  Methotrexate 2.5 Mg Tabs (Methotrexate Sodium) .... 4 Tablets Every Monday  Allergies (verified): No Known Drug Allergies  Past History:  Past Medical History: DEPRESSION  PERTUSSIS U#Pseudotumor Cerebri  ........Marland KitchenDr Hyacinth Meeker and Dr. Dione Booze  - dxed Jan 2009, on diamox  - LP 01/24/2008 at [facility] - Normal ( pressure 14cm, gluc 68, Protein 23, Clear, colorless, RBC 3, WBC - too few to count) - CT head non contrast done at [facility] 11/25/2007: "normal". Ddoes not report MRI, MRA, MRV being done  - 2nd opinon by Dr. Pearlean Brownie - reportdly had MRI/MRV both normal; followup pending  #LUPUS PERNIO/SARCOIDOSIS -skin bx confirmed at [facility] 05/07/2009 (personally reviewed)  -looks improved 07/09/2009 -  #STage 2 pulmonary sarcoidosois.............Lavinia Sharps - CT chest 05/13/2009. -> bx confirmed by EBUS and RUL TBBx on 05/27/2009  -Commence prednisone/methotrexate/folic acid/bactrim 06/02/2009  -PFT 06/07/2009: Fev1 2.2L/77%, FVC 2.68L/77%, TLC 3.8L/75%, DLCO 19/77%  - July 2010: asymptomatic but cxr without change  -Spiro 09/03/2009 at [facility] - ->  fev1 2.3L/ FVC 89%, 2.84L/90%.  Ratio 83 -> stop mtx, wean pred  - CXR 01/28/2010 -> progressive diseae -> continue pred at 5mg   - PFT 06/13/2010 on pred 5mg  - fev1 1.6L/54%, FVC 2.0L/55%, Ratio 77% , TLC 60%, DLCO 16/63% -> restart pred/mtx  Cleda Daub 07/25/2010 on pred 10/mtx 10 -> FEv1 1.84L/775%, FVC 2.25L  - SPiro 09/06/2010 -> Fev1 2.14L/81%. Cut pred to 5mg  per day. Cont mtx  - SPiro 01/20/2011 - Fev1 1.6L/61%, Ratio 74%. CXR Worse -> increased pred to 10mg  perday. Cont Mtx.   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  Family History: Reviewed history from 05/12/2009 and no changes required. Allergies-mother No sarcoid, asthma No premature CAD in immediate family  Social History: Reviewed history from 01/27/2011 and no changes required. 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 Smoked for 15 years before that - 4 cig/day  Review  of Systems       no fevers or chills, productive cough, hemoptysis, dysphasia, odynophagia, melena, hematochezia, dysuria, hematuria, rash, seizure activity, orthopnea, PND, pedal edema, claudication. Remaining systems are negative.   Vital Signs:  Patient profile:   40 year old female Height:      64 inches Weight:      204.25 pounds BMI:     35.19 Pulse rate:   98 / minute BP sitting:   122 / 80  (left arm) Cuff size:   large  Vitals Entered By: Caralee Ates CMA (January 30, 2011 12:19 PM)  Physical Exam  General:  Well-developed well-nourished in no acute distress.  Skin is warm and dry.  HEENT is normal.  Neck is supple. No thyromegaly.  Chest is clear to auscultation with normal expansion.  Cardiovascular exam is regular rate and rhythm.  Abdominal exam nontender or distended. No masses palpated. Extremities show no edema. neuro grossly intact    EKG  Procedure date:  01/20/2011  Findings:      Sinus Tachycardia.  Impression & Recommendations:  Problem # 1:  PALPITATIONS (ICD-785.1) Etiology unclear. Recent potassium and hemoglobin normal. Check TSH. Check echocardiogram. Given history of pulmonary sarcoid I am concerned about cardiac involvement. Scheduled CardioNet to exclude ventricular tachycardia. Discontinue caffeine. Orders: Echocardiogram (Echo) Event (Event) TLB-TSH (Thyroid Stimulating Hormone) (84443-TSH)  Problem # 2:  DYSPNEA (ICD-786.05) Most likely secondary to pulmonary sarcoid.  Problem # 3:  PSEUDOTUMOR CEREBRI (ICD-348.2)  Problem # 4:  SARCOIDOSIS (ICD-135) Management per pulmonary.  Patient Instructions: 1)  Your physician recommends that you schedule a follow-up appointment in: 6-8 WEEKS 2)  Your physician has requested that you have an echocardiogram.  Echocardiography is a painless test that uses sound waves to create images of your heart. It provides your doctor with information about the size and shape of your heart and how well  your heart's chambers and valves are working.  This procedure takes approximately one hour. There are no restrictions for this procedure. 3)  Your physician has recommended that you wear an event monitor.  Event monitors are medical devices that record the heart's electrical activity. Doctors most often use these monitors to diagnose arrhythmias. Arrhythmias are problems with the speed or rhythm of the heartbeat. The monitor is a small, portable device. You can wear one while you do your normal daily activities. This is usually used to diagnose what is causing palpitations/syncope (passing out).

## 2011-02-10 ENCOUNTER — Encounter: Payer: Self-pay | Admitting: Internal Medicine

## 2011-02-10 ENCOUNTER — Ambulatory Visit (INDEPENDENT_AMBULATORY_CARE_PROVIDER_SITE_OTHER): Payer: BC Managed Care – PPO

## 2011-02-10 ENCOUNTER — Telehealth: Payer: Self-pay | Admitting: Internal Medicine

## 2011-02-10 ENCOUNTER — Ambulatory Visit (HOSPITAL_COMMUNITY): Payer: BC Managed Care – PPO | Attending: Cardiology

## 2011-02-10 DIAGNOSIS — R0989 Other specified symptoms and signs involving the circulatory and respiratory systems: Secondary | ICD-10-CM | POA: Insufficient documentation

## 2011-02-10 DIAGNOSIS — D869 Sarcoidosis, unspecified: Secondary | ICD-10-CM | POA: Insufficient documentation

## 2011-02-10 DIAGNOSIS — R0609 Other forms of dyspnea: Secondary | ICD-10-CM | POA: Insufficient documentation

## 2011-02-10 DIAGNOSIS — R002 Palpitations: Secondary | ICD-10-CM | POA: Insufficient documentation

## 2011-02-10 DIAGNOSIS — Z23 Encounter for immunization: Secondary | ICD-10-CM

## 2011-02-13 ENCOUNTER — Ambulatory Visit (HOSPITAL_COMMUNITY)
Admission: RE | Admit: 2011-02-13 | Discharge: 2011-02-13 | Disposition: A | Discharge status: Home / Self Care | Payer: BC Managed Care – PPO | Source: Ambulatory Visit | Attending: Cardiology | Admitting: Cardiology

## 2011-02-13 DIAGNOSIS — D8685 Sarcoid myocarditis: Secondary | ICD-10-CM

## 2011-02-13 DIAGNOSIS — I43 Cardiomyopathy in diseases classified elsewhere: Secondary | ICD-10-CM

## 2011-02-13 DIAGNOSIS — D869 Sarcoidosis, unspecified: Secondary | ICD-10-CM | POA: Insufficient documentation

## 2011-02-13 DIAGNOSIS — J99 Respiratory disorders in diseases classified elsewhere: Secondary | ICD-10-CM | POA: Insufficient documentation

## 2011-02-13 MED ORDER — GADOPENTETATE DIMEGLUMINE 469.01 MG/ML IV SOLN
45.0000 mL | Freq: Once | INTRAVENOUS | Status: AC
  Administered 2011-02-13: 45 mL via INTRAVENOUS

## 2011-02-15 ENCOUNTER — Ambulatory Visit (INDEPENDENT_AMBULATORY_CARE_PROVIDER_SITE_OTHER): Payer: BC Managed Care – PPO | Admitting: Internal Medicine

## 2011-02-15 ENCOUNTER — Encounter: Payer: Self-pay | Admitting: Internal Medicine

## 2011-02-15 DIAGNOSIS — D869 Sarcoidosis, unspecified: Secondary | ICD-10-CM

## 2011-02-15 DIAGNOSIS — I498 Other specified cardiac arrhythmias: Secondary | ICD-10-CM

## 2011-02-15 DIAGNOSIS — I472 Ventricular tachycardia: Secondary | ICD-10-CM

## 2011-02-15 DIAGNOSIS — F329 Major depressive disorder, single episode, unspecified: Secondary | ICD-10-CM | POA: Insufficient documentation

## 2011-02-15 NOTE — Progress Notes (Signed)
  Phone Note Outgoing Call   Call placed by: Deliah Goody, RN,  February 08, 2011 4:58 PM Summary of Call: pt with v-tach on monitor. dr [name] reviewed and discussed with dr Graciela Husbands. pt needs cardiac MRI to r/o sarcoid and then an appt to see dr Graciela Husbands next week. Left message to call back Deliah Goody, RN  February 08, 2011 4:59 PM   Follow-up for Phone Call        spoke with pt, she is aware of the appt with dr Graciela Husbands and the need for the cardiac MRI. per dr [name] pt will start metoprolol succ 25mg  once daily. explained to pt if she has any episodes of feeling faint to go to [place]. Deliah Goody, RN  February 09, 2011 10:39 AM      Appended Document:     Clinical Lists Changes  Medications: Added new medication of METOPROLOL SUCCINATE 25 MG XR24H-TAB (METOPROLOL SUCCINATE) Take one tablet by mouth daily - Signed Rx of METOPROLOL SUCCINATE 25 MG XR24H-TAB (METOPROLOL SUCCINATE) Take one tablet by mouth daily;  #30 x 12;  Signed;  Entered by: Deliah Goody, RN;  Authorized by: Ferman Hamming, MD, [facility];  Method used: Electronically to [facility] [phone]*, [address] [place], [place], [place]  [address], Ph: [phone], Fax: [phone]    Prescriptions: METOPROLOL SUCCINATE 25 MG XR24H-TAB (METOPROLOL SUCCINATE) Take one tablet by mouth daily  #30 x 12   Entered by:   Deliah Goody, RN   Authorized by:   Ferman Hamming, MD, [facility]   Signed by:   Deliah Goody, RN on 02/09/2011   Method used:   Electronically to        [facility] [address] #[id number]* (retail)       [address]       [place], [place]  [address]       Ph: [phone]       Fax: [phone]   RxID:   [id number]

## 2011-02-15 NOTE — Progress Notes (Signed)
Summary: Cardiology Phone Note - WCT and Sinus Tach  Phone Note Outgoing Call Call back at [facility] Phone [phone]   Summary of Call: Returned call to Joe at [employer] ([phone]) concerning Sinus Tach 122-132 bpm and 6 seconds of wide complex tachycardia ranging 146-226 bpm.  Dr. [name] is aware of arrythmias from reviewing prior notes.  [employer] was were unable to get in contact with the patient.  I have since spoken with the pt and she denies any symptoms of SOB, dizziness, syncompe, fatigue, or CP.  She says she has a message to call Stanton Kidney back about scheduling an appt.  She will call tomorrow to schedule this.  I have informed her to call 911 if she begins to have the above symptoms, pt voices understanding.   Initial call taken by: Robbi Garter NP-PA,  February 08, 2011 7:05 PM

## 2011-02-15 NOTE — Assessment & Plan Note (Signed)
Summary: Tdap injection//jrc  Nurse Visit   Allergies: No Known Drug Allergies  Immunizations Administered:  Tetanus Vaccine:    Vaccine Type: Tdap    Site: left deltoid    Mfr: GlaxoSmithKline    Dose: 0.5 ml    Route: IM    Given by: Carron Curie CMA    Exp. Date: 03/18/2012    Lot #: [id number]  Orders Added: 1)  Tdap => 76yrs IM [90715] 2)  Admin 1st Vaccine [[address]]

## 2011-02-15 NOTE — Progress Notes (Signed)
Summary: pt is on her way for TDAP  Phone Note Call from Patient Call back at Home Phone [phone]   Caller: Patient Call For: [facility] Summary of Call: Patient phoned and wanted to leave a message for Victorino Dike stated that Victorino Dike wanted her to call her and let her know when she was about to walk in the door for her TDAP. she is on her way now. she can be reached at [phone] Initial call taken by: Vedia Coffer,  February 10, 2011 11:38 AM  Follow-up for Phone Call        pt came and was given TDAP vaccine. See nurse visit for documentation. Carron Curie CMA  February 10, 2011 3:46 PM

## 2011-02-16 ENCOUNTER — Telehealth: Payer: Self-pay | Admitting: Internal Medicine

## 2011-02-17 ENCOUNTER — Telehealth: Payer: Self-pay | Admitting: Internal Medicine

## 2011-02-17 ENCOUNTER — Encounter: Payer: Self-pay | Admitting: Internal Medicine

## 2011-02-18 ENCOUNTER — Encounter: Payer: Self-pay | Admitting: Cardiology

## 2011-02-21 NOTE — Progress Notes (Signed)
Summary: pt calling re surgery  Phone Note Call from Patient   Caller: Patient [phone] Reason for Call: Talk to Nurse Summary of Call: pt was to call back re surgery Initial call taken by: Glynda Jaeger,  February 16, 2011 3:53 PM  Follow-up for Phone Call        02/16/11--1600pm--pt calling stating she has decided to go ahead with defrib and pacer placement and calling to find out what she's to do next--please call her--thanks nt Follow-up by: Ledon Snare, RN,  February 16, 2011 4:00 PM     Appended Document: pt calling re surgery procedure scheduled for 3/812 at 9:30

## 2011-02-21 NOTE — Assessment & Plan Note (Signed)
Summary: per dr Janeann Merl on monitor/f/u cardiac mri/dm   Visit Type:  np Referring Provider:  Dr. Janace Hoard Primary Provider:  Dr. Janace Hoard [facility], DR. Hyacinth Meeker at [facility] in [facility], Dr. Dione Booze at [facility]  CC:  no complaints.  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  She has had intermittent palpitations since 2010. They are sudden in onset and resolved spontaneously. They're described as her heart racing followed by a skip. There is no associated chest pain, shortness of breath, or syncope. It lasts several seconds. They initially improved after discontinuing caffeine. However she has now resumed her caffeine and they have returned.chest she has had no syncope.  She also has dyspnea on exertion the visiting much improved since treated with steroids and methotrexate. I should note that the diagnosis of sarcoid was initially made on the cutaneous biopsy  Current Medications (verified): 1)  Diamox 250 Mg .... Take 1 Tablet By Mouth Two Times A Day 2)  Motrin Ib 200 Mg Tabs (Ibuprofen) .... As Directed 3)  Folic Acid 800 Mcg Tabs (Folic Acid) .... Take 1 Tablet By Mouth Two Times A Day 4)  Bactrim Ds 800-160 Mg Tabs (Sulfamethoxazole-Trimethoprim) .... Take 1 Tablet On Monday, Wednesday,  Friday 5)  Prednisone 10 Mg Tabs (Prednisone) .... Take 1 Tablet By Mouth Once A Day 6)  Topamax 25 Mg Tabs (Topiramate) .... Take 1 Tablet By Mouth Two Times A Day 7)  Calcium 500 Mg Tabs (Calcium) .... Take 1 Tablet By Mouth Once A Day 8)  Vitamin D3 1000 Unit Tabs (Cholecalciferol) .... Take 1 Tablet By Mouth Once A Day 9)  Iron 325 (65 Fe) Mg Tabs (Ferrous Sulfate) .... Take 1 Tablet By Mouth Once A Day 10)  Methotrexate 2.5 Mg Tabs (Methotrexate Sodium) .... 4 Tablets Every Monday 11)  Metoprolol  Succinate 25 Mg Xr24h-Tab (Metoprolol Succinate) .... Take One Tablet By Mouth Daily  Allergies (verified): No Known Drug Allergies  Past History:  Past Medical History: Last updated: 01/30/2011 DEPRESSION  PERTUSSIS U#Pseudotumor Cerebri ........Marland KitchenDr Hyacinth Meeker and Dr. Dione Booze  - dxed Jan 2009, on diamox  - LP 01/24/2008 at [facility] - Normal ( pressure 14cm, gluc 68, Protein 23, Clear, colorless, RBC 3, WBC - too few to count) - CT head non contrast done at [facility] 11/25/2007: "normal". Ddoes not report MRI, MRA, MRV being done  - 2nd opinon by Dr. Pearlean Brownie - reportdly had MRI/MRV both normal; followup pending  #LUPUS PERNIO/SARCOIDOSIS -skin bx confirmed at [facility] 05/07/2009 (personally reviewed)  -looks improved 07/09/2009 -  #STage 2 pulmonary sarcoidosois.............Lavinia Sharps - CT chest 05/13/2009. -> bx confirmed by EBUS and RUL TBBx on 05/27/2009  -Commence prednisone/methotrexate/folic acid/bactrim 06/02/2009  -PFT 06/07/2009: Fev1 2.2L/77%, FVC 2.68L/77%, TLC 3.8L/75%, DLCO 19/77%  - July 2010: asymptomatic but cxr without change  -Spiro 09/03/2009 at [facility] - ->  fev1 2.3L/ FVC 89%, 2.84L/90%.  Ratio 83 -> stop mtx, wean pred  - CXR 01/28/2010 -> progressive diseae -> continue pred at 5mg   - PFT 06/13/2010 on pred 5mg  - fev1 1.6L/54%, FVC 2.0L/55%, Ratio 77% , TLC 60%, DLCO 16/63% -> restart pred/mtx  Cleda Daub 07/25/2010 on pred 10/mtx 10 -> FEv1 1.84L/775%, FVC 2.25L  - SPiro 09/06/2010 -> Fev1 2.14L/81%. Cut pred to 5mg  per day. Cont mtx  - SPiro 01/20/2011 - Fev1 1.6L/61%,  Ratio 74%. CXR Worse -> increased pred to 10mg  perday. Cont Mtx.   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 Smoked for 15 years before that - 4 cig/day  Risk Factors: Smoking Status: quit (01/20/2011) Packs/Day: <0.25 (01/20/2011)  Review of Systems       full review of systems was negative apart from a history of present illness and past medical history.   Vital Signs:  Patient profile:   40 year old female Height:      64 inches Weight:      206.25 pounds BMI:     35.53 Pulse rate:   92 / minute BP sitting:   124 / 77  (left arm) Cuff size:   regular  Vitals Entered By: Caralee Ates CMA (February 15, 2011 3:47 PM)  Physical Exam  General:  Well developed, well nourished,African Americanfemale appearing her stated agein no acute distress. Head:  normal HEENT Neck:  supple without thyromegaly flat neck veins carotids brisk and full without bruits Chest Wall:  without kyphosis Lungs:  clear to auscultation Heart:  regular rate and rhythm without murmurs or gallops Abdomen:  soft with active bowel sounds midline pulsation or hepatomegaly Msk:  Back normal, normal gait. Muscle strength and tone normal. Pulses:  pulses normal in all 4 extremities Extremities:  No clubbing or cyanosis. Neurologic:  Alert and oriented x 3.grossly normal motor and sensory function Skin:  without rashes or lesions Cervical Nodes:  without adenopathy Psych:  engaging affect   EKG  Procedure date:  01/20/2011  Findings:      sinus tachycardia at 129 Intervals 0.13 5.08/23 zero Otherwise normal apart from an R. prime in lead V1  Impression & Recommendations:  Problem # 1:  SINUS TACHYCARDIA (ICD-427.89)  the patient has unexplained sinus tachycardia;  hemoglobin is in range her TSH last week was normal. Sarcoid is associated with increased intensity in the ventricular rhythms. I wonder whether this indeed may be true in the atrium.  Her  updated medication list for this problem includes:    Metoprolol Succinate 25 Mg Xr24h-tab (Metoprolol succinate) .Marland Kitchen... Take one tablet by mouth daily  Problem # 2:  VENTRICULAR TACHYCARDIA (ICD-427.1) the patient has ventricular tachycardia in the setting of cardiac sarcoid confirmed by MRI. ICD implantation with dual-chamber system would be appropriate forreduction of sudden death risk as well as protection against complete heart block. It is interesting that the ventricular arrhythmias and emergent setting of ongoing chronic steroid therapy.  I reviewed with the patient potential benefits as well as risks of ICD implantation including but not limited to infection perforation lead dislodgment device malfunction and non-resuscitation. She understands these risks. Should like to talk about her family. We also discussed issues related to keloid formation and the role of post keloid steroid injections as well as periprocedural silicone mesh therapy Her updated medication list for this problem includes:    Metoprolol Succinate 25 Mg Xr24h-tab (Metoprolol succinate) .Marland Kitchen... Take one tablet by mouth daily  Problem # 3:  SARCOIDOSIS (ICD-135) as above  Patient Instructions: 1)  Your physician recommends  that you continue on your current medications as directed. Please refer to the Current Medication list given to you today. 2)  WILL DISCUSS WITH FAMILY AND  WILL CALL BACK TO SCHEDULE DUAL CHAMBER ICD

## 2011-02-21 NOTE — Letter (Addendum)
Summary: Implantable Device Instructions  Architectural technologist, Main Office  [address]   [place], [place] [address]   Phone: [phone]  Fax: [phone]      Implantable Device Instructions  You are scheduled for:  _____ Permanent Transvenous Pacemaker ____X_ Implantable Cardioverter Defibrillator _____ Implantable Loop Recorder _____ Generator Change  on ___3/812__ with Dr. KLEIN_____.  1.  Please arrive at the Short Stay Center at [facility] at __7:30 AM___ on the day of your procedure.  2.  Do not eat or drink the night before your procedure.  3.  Complete lab work on __3/6/12 AT 10:00 AM__.  The lab at [facility] is open from 8:30 AM to 1:30 PM and from 2:30 PM to 5:00 PM.  The lab at [facility] is open from 7:30 AM to 5:30 PM.  You do not have to be fasting.    4.  Plan for an overnight stay.  Bring your insurance cards and a list of your medications.  5.  Wash your chest and neck with antibacterial soap (any brand) the evening before and the morning of your procedure.  Rinse well.  6.  Education material received:     Pacemaker _____           ICD _X____           Arrhythmia _____  *If you have ANY questions after you get home, please call the office [phone].  *Every attempt is made to prevent procedures from being rescheduled.  Due to the nauture of Electrophysiology, rescheduling can happen.  The physician is always aware and directs the staff when this occurs.     DR Berton Mount [facility] LPN

## 2011-02-21 NOTE — Progress Notes (Signed)
Summary: wishd good luck for defib placement  Phone Note Outgoing Call   Summary of Call: spoke to patient. AGreed she should go ahead with defib. She is seeing Donnie Aho at [facility] for sarcoid 3/7 and defib placement on 3/8. Wished good luck. Will see her after above Initial call taken by: Kalman Shan MD,  February 17, 2011 5:31 PM

## 2011-02-21 NOTE — Assessment & Plan Note (Signed)
Summary: TDAP/LED  Nurse Visit   Allergies: No Known Drug Allergies  Orders Added: 1)  No Charge Patient Arrived (NCPA0) [NCPA0]

## 2011-02-22 ENCOUNTER — Ambulatory Visit (HOSPITAL_COMMUNITY)
Admission: RE | Admit: 2011-02-22 | Payer: BC Managed Care – PPO | Source: Ambulatory Visit | Admitting: Internal Medicine

## 2011-02-23 HISTORY — PX: CARDIAC DEFIBRILLATOR PLACEMENT: SHX171

## 2011-02-28 ENCOUNTER — Encounter: Payer: Self-pay | Admitting: Internal Medicine

## 2011-02-28 ENCOUNTER — Other Ambulatory Visit: Payer: Self-pay | Admitting: Internal Medicine

## 2011-02-28 ENCOUNTER — Other Ambulatory Visit (INDEPENDENT_AMBULATORY_CARE_PROVIDER_SITE_OTHER): Payer: BC Managed Care – PPO

## 2011-02-28 DIAGNOSIS — I498 Other specified cardiac arrhythmias: Secondary | ICD-10-CM

## 2011-02-28 DIAGNOSIS — I472 Ventricular tachycardia: Secondary | ICD-10-CM

## 2011-02-28 DIAGNOSIS — Z01810 Encounter for preprocedural cardiovascular examination: Secondary | ICD-10-CM

## 2011-02-28 LAB — CBC WITH DIFFERENTIAL/PLATELET
Basophils Absolute: 0 10*3/uL (ref 0.0–0.1)
Eosinophils Relative: 0.2 % (ref 0.0–5.0)
HCT: 37.4 % (ref 36.0–46.0)
Hemoglobin: 12.5 g/dL (ref 12.0–15.0)
Lymphs Abs: 1.4 10*3/uL (ref 0.7–4.0)
MCV: 85 fl (ref 78.0–100.0)
Monocytes Absolute: 0.6 10*3/uL (ref 0.1–1.0)
Neutro Abs: 10.5 10*3/uL — ABNORMAL HIGH (ref 1.4–7.7)
Platelets: 816 10*3/uL — ABNORMAL HIGH (ref 150.0–400.0)
RDW: 16.8 % — ABNORMAL HIGH (ref 11.5–14.6)

## 2011-02-28 LAB — BASIC METABOLIC PANEL
BUN: 12 mg/dL (ref 6–23)
Chloride: 108 mEq/L (ref 96–112)
Glucose, Bld: 115 mg/dL — ABNORMAL HIGH (ref 70–99)
Potassium: 4.3 mEq/L (ref 3.5–5.1)

## 2011-02-28 LAB — APTT: aPTT: 29.4 s — ABNORMAL HIGH (ref 21.7–28.8)

## 2011-03-02 ENCOUNTER — Observation Stay (HOSPITAL_COMMUNITY)
Admission: RE | Admit: 2011-03-02 | Discharge: 2011-03-03 | DRG: 227 | Disposition: A | Discharge status: Home / Self Care | Payer: BC Managed Care – PPO | Source: Ambulatory Visit | Attending: Internal Medicine | Admitting: Internal Medicine

## 2011-03-02 DIAGNOSIS — D869 Sarcoidosis, unspecified: Secondary | ICD-10-CM | POA: Insufficient documentation

## 2011-03-02 DIAGNOSIS — F329 Major depressive disorder, single episode, unspecified: Secondary | ICD-10-CM | POA: Insufficient documentation

## 2011-03-02 DIAGNOSIS — I472 Ventricular tachycardia, unspecified: Principal | ICD-10-CM | POA: Insufficient documentation

## 2011-03-02 DIAGNOSIS — E559 Vitamin D deficiency, unspecified: Secondary | ICD-10-CM | POA: Insufficient documentation

## 2011-03-02 DIAGNOSIS — G932 Benign intracranial hypertension: Secondary | ICD-10-CM | POA: Insufficient documentation

## 2011-03-02 DIAGNOSIS — I43 Cardiomyopathy in diseases classified elsewhere: Secondary | ICD-10-CM | POA: Insufficient documentation

## 2011-03-02 DIAGNOSIS — Z87891 Personal history of nicotine dependence: Secondary | ICD-10-CM | POA: Insufficient documentation

## 2011-03-02 DIAGNOSIS — I4729 Other ventricular tachycardia: Principal | ICD-10-CM | POA: Insufficient documentation

## 2011-03-02 DIAGNOSIS — F3289 Other specified depressive episodes: Secondary | ICD-10-CM | POA: Insufficient documentation

## 2011-03-02 LAB — CBC
HCT: 38.1 % (ref 36.0–46.0)
MCH: 27.3 pg (ref 26.0–34.0)
MCV: 84.7 fL (ref 78.0–100.0)
Platelets: 771 10*3/uL — ABNORMAL HIGH (ref 150–400)
RBC: 4.5 MIL/uL (ref 3.87–5.11)

## 2011-03-02 NOTE — Procedures (Signed)
Summary: Summary Report  Summary Report   Imported By: Erle Crocker 02/24/2011 13:40:48  _____________________________________________________________________  External Attachment:    Type:   Image     Comment:   External Document

## 2011-03-03 ENCOUNTER — Inpatient Hospital Stay (HOSPITAL_COMMUNITY): Payer: BC Managed Care – PPO

## 2011-03-03 ENCOUNTER — Encounter: Payer: Self-pay | Admitting: Cardiology

## 2011-03-06 ENCOUNTER — Encounter: Payer: Self-pay | Admitting: Internal Medicine

## 2011-03-07 NOTE — Procedures (Signed)
Summary: Event Strips  Event Strips   Imported By: Marylou Mccoy 02/28/2011 15:35:56  _____________________________________________________________________  External Attachment:    Type:   Image     Comment:   External Document

## 2011-03-14 NOTE — Miscellaneous (Signed)
Summary: Device preload  Clinical Lists Changes  Observations: Added new observation of ICD INDICATN: VT (03/06/2011 13:57) Added new observation of ICDLEADSTAT2: active (03/06/2011 13:57) Added new observation of ICDLEADSER2: [id number] (03/06/2011 13:57) Added new observation of ICDLEADMOD2: 6947  (03/06/2011 13:57) Added new observation of ICDLEADLOC2: RV  (03/06/2011 13:57) Added new observation of ICDLEADSTAT1: active  (03/06/2011 13:57) Added new observation of ICDLEADSER1: [id number]  (03/06/2011 13:57) Added new observation of ICDLEADMOD1: 5076  (03/06/2011 13:57) Added new observation of ICDLEADLOC1: RA  (03/06/2011 13:57) Added new observation of ICD IMP MD: Sherryl Manges, MD  (03/06/2011 13:57) Added new observation of ICDLEADDOI2: 03/02/2011  (03/06/2011 13:57) Added new observation of ICDLEADDOI1: 03/02/2011  (03/06/2011 13:57) Added new observation of ICD IMPL DTE: 03/02/2011  (03/06/2011 13:57) Added new observation of ICD SERL#: [id number] H  (03/06/2011 13:57) Added new observation of ICD MODL#: [id number]  (03/06/2011 13:57) Added new observation of ICDMANUFACTR: Medtronic  (03/06/2011 13:57) Added new observation of ICD MD: Sherryl Manges, MD  (03/06/2011 13:57)       ICD Specifications Following MD:  Sherryl Manges, MD     ICD Vendor:  Medtronic     ICD Model Number:  D314DRG     ICD Serial Number:  [id number] ICD DOI:  03/02/2011     ICD Implanting MD:  Sherryl Manges, MD  Lead 1:    Location: RA     DOI: 03/02/2011     Model #: 1027     Serial #: [id number]     Status: active Lead 2:    Location: RV     DOI: 03/02/2011     Model #: 7425     Serial #: [id number]     Status: active  Indications::  VT

## 2011-03-16 ENCOUNTER — Ambulatory Visit (INDEPENDENT_AMBULATORY_CARE_PROVIDER_SITE_OTHER): Payer: BC Managed Care – PPO | Admitting: *Deleted

## 2011-03-16 ENCOUNTER — Ambulatory Visit: Payer: BC Managed Care – PPO | Admitting: Cardiology

## 2011-03-16 DIAGNOSIS — I428 Other cardiomyopathies: Secondary | ICD-10-CM

## 2011-03-16 NOTE — Discharge Summary (Signed)
NAME:  Alexandra Hood, Alexandra Hood NO.:  [id number]  MEDICAL RECORD NO.:  [id number]           PATIENT TYPE:  I  LOCATION:  2018                         FACILITY:  [facility]  PHYSICIAN:  Duke Salvia, MD, FACCDATE OF BIRTH:  October 16, 1971  DATE OF ADMISSION:  03/02/2011 DATE OF DISCHARGE:  03/03/2011                              DISCHARGE SUMMARY   PROCEDURES: 1. Insertion of a dual-chamber Medtronic D314DRG defibrillator. 2. Two-view chest x-ray.  PRIMARY FINAL DISCHARGE DIAGNOSES: 1. Sarcoid heart disease with ventricular tachycardia. 2. Depression. 3. History of pertussis. 4. Pseudotumor cerebri. 5. Lupus pernio-sarcoidosis. 6. 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 7. Vitamin D deficiency. 8. Status post oophorectomy for teratoma. 9. Remote history of tobacco use, quit in 2008. 10.Bronchoscopy with biopsies. 11.History of exploratory laparotomy secondary to a dermoid cyst in     2003.  TIME AT DISCHARGE:  Thirty six minutes.  HOSPITAL COURSE:  Ms. Devaux is a 40 year old female who was diagnosed with sarcoid.  She was evaluated by Dr. Berton Mount for palpitations and had an event monitor as well as an echocardiogram.  She was diagnosed with VT and the echo as well as MRI confirmed cardiac sarcoid.  She was scheduled for defibrillator and came to the hospital for the procedure on March 02, 2011.  She had successful implantation of a Medtronic Protecta XT DR dual lead defibrillator.  She tolerated the procedure well.  On March 03, 2011, a two-view chest x-ray showed the defibrillator appropriately positioned.  She had conglomerate scarring in the upper lobes consistent with a history of sarcoid but no active disease.  Her vital signs were stable.  Dr. Graciela Husbands reviewed the labs and noted a mildly abnormal white count of 13,000 and thrombocytosis with a platelet count of 771,000.  MRSA and Staph aureus by PCR were both negative.  On March 03, 2011, Ms. Dumas-Hightower was  doing well and considered stable for discharge, to follow up as an outpatient.  DISCHARGE INSTRUCTIONS:  Her activity level is to be increased gradually.  She is not to shower for a week and no driving for a week. She is to call our office for problems with the incision and follow other instructions on the discharge instruction sheet.  She is to get a wound check at the [facility] office on March 16, 2011, at 9:30 and follow up with Dr. Graciela Husbands on Apr 25, 2011, at 9:15.  She is to follow up with Dr. Marchelle Gearing, Dr. Dione Booze, Dr. Alwyn Ren, and Dr. Hyacinth Meeker in [facility] as needed or as scheduled.  DISCHARGE MEDICATIONS: 1. Prednisone 10 mg daily. 2. Topamax 25 mg b.i.d. 3. Methotrexate 2.5 mg 4 tablets on Mondays. 4. Vitamin D3 OTC daily. 5. Folic acid 0.8 mg b.i.d. 6. Bactrim 800/600 one tablet on Monday, Wednesday, and Friday. 7. Calcium carbonate daily. 8. Motrin 200 mg 1-2 tablets q.8 h. p.r.n. 9. Vicodin 5 mg 1-2 tablets q.6 h. p.r.n. 10.Iron 325 mg daily. 11.Diamox 250 mg b.i.d. 12.Toprol-XL 25 mg b.i.d. as prior to admission.     Theodore Demark, PA-C   ______________________________ Duke Salvia, MD, [facility]    RB/MEDQ  D:  03/03/2011  T:  03/04/2011  Job:  [id number]  cc:   Molly Maduro L. Dione Booze, M.D. Dr. Alwyn Ren. Dr. Hyacinth Meeker. Kalman Shan, MD  Electronically Signed by Theodore Demark PA-C on 03/10/2011 03:57:01 PM Electronically Signed by Sherryl Manges MD [facility] on 03/16/2011 08:35:28 AM

## 2011-03-16 NOTE — Op Note (Signed)
NAME:  Alexandra Hood, Alexandra Hood NO.:  [id number]  MEDICAL RECORD NO.:  [id number]           PATIENT TYPE:  I  LOCATION:  2018                         FACILITY:  [facility]  PHYSICIAN:  Duke Salvia, MD, FACCDATE OF BIRTH:  26-Jun-1971  DATE OF PROCEDURE:  03/02/2011 DATE OF DISCHARGE:                              OPERATIVE REPORT   PREOPERATIVE DIAGNOSIS:  Sarcoid heart disease with ventricular tachycardia.  POSTOPERATIVE DIAGNOSIS:  Sarcoid heart disease with ventricular tachycardia.  PROCEDURE:  Dual-chamber defibrillator implantation.  Following obtaining informed consent, the patient was brought to the electrophysiology laboratory and placed on the fluoroscopic table in supine position.  After routine prep and drape of the left upper chest, lidocaine was infiltrated in prepectoral subclavicular region.  An incision was made and carried down to layer of the prepectoral fascia using electrocautery and with sharp dissection, a pocket was formed similarly.  Hemostasis was obtained.  Thereafter attention was turned to gain access to extrathoracic left subclavian vein which was accomplished without difficulty without the aspiration of air or puncture of the artery.  Two separate venipunctures were accomplished.  Guidewires were placed and retained and sequentially 9-French and 7-French sheath were placed through which we passed a Medtronic 6947 65-cm dual coil active fixation defibrillator lead, serial #[id number] and a Medtronic 5076 52-cm active fixation atrial lead, serial #[id number].  Under fluoroscopic guidance, these were manipulated to the right ventricular apex and right atrial appendage respectively.  The right ventricular lead having to be repositioned following deployment because of falling R waves.  Through the PSA, the bipolar R-wave was 14.4, the pace impedance of 683, threshold 1.1 at 0.5, current threshold was 1.6 mA.  There is no diaphragmatic pacing  at 10 volts and the current of injury was brisk.  The bipolar P-wave was 1.2 with pace impedance of 729.  A threshold of 1.25, current at 5 volts of 1.5 mA.  There is no diaphragmatic pacing at 10 volts and the current of injury was brisk.  These leads were secured to the prepectoral fascia and then attached to a Medtronic D314DRG defibrillator, serial #[id number].  Through the device, bipolar P-wave was 2.6 with a pace impedance of 494, threshold of 1.2 at 0.4.  The bipolar R-wave was 8.0 with pace impedance of 530, threshold of 0.7 at 0.4, 46 and 52.  With these acceptable parameters recorded, defibrillation threshold test was undertaken.  Ventricular fibrillation could not be induced with T-wave shock.  We ended up having used a 50 Hz pace.  On one occasion, we caused atrial fibrillation with a rapid ventricular response.  Subsequently, this was converted to sinus rhythm.  We then induced ventricular fibrillation.  After a total duration of 11 seconds including induction time at 20-joule shock was delivered to a measured resistance of 48 ohms, terminating ventricular fibrillation, restoring sinus rhythm.  The device was implanted.  The pocket was copiously irrigated with antibiotic containing saline solution. Hemostasis was assured.  Leads and pulse generator were placed in the pocket and secured to the pectoral fascia. The wound was closed in 3 layers in a normal fashion.  The wound was washed and dried with benzoin and Steri-Strip  dressing was applied. Needle counts, sponge counts, and instrument counts were correct at the end of the procedure according to staff.  The patient tolerated the procedure without apparent complication.     Duke Salvia, MD, [facility]     SCK/MEDQ  D:  03/02/2011  T:  03/03/2011  Job:  [id number]  Electronically Signed by Sherryl Manges MD [facility] on 03/16/2011 08:35:24 AM

## 2011-03-20 ENCOUNTER — Telehealth: Payer: Self-pay | Admitting: Internal Medicine

## 2011-03-20 ENCOUNTER — Telehealth: Payer: Self-pay | Admitting: *Deleted

## 2011-03-20 DIAGNOSIS — R000 Tachycardia, unspecified: Secondary | ICD-10-CM

## 2011-03-20 DIAGNOSIS — R002 Palpitations: Secondary | ICD-10-CM

## 2011-03-20 MED ORDER — METOPROLOL SUCCINATE ER 25 MG PO TB24: 25.0000 mg | ORAL_TABLET | Freq: Two times a day (BID) | ORAL | Status: DC

## 2011-03-20 NOTE — Telephone Encounter (Signed)
SPOKE WITH PT NEEDS REFILL OF METOPROLOL 25 MG EVERY DAY

## 2011-03-20 NOTE — Telephone Encounter (Signed)
ERROR PT TAKES METOPROLOL 25 MG BID .

## 2011-03-20 NOTE — Telephone Encounter (Signed)
.   Pt needs new rx for metoprolol. Pt also has question re meds.

## 2011-03-21 ENCOUNTER — Telehealth: Payer: Self-pay | Admitting: Internal Medicine

## 2011-03-21 DIAGNOSIS — R000 Tachycardia, unspecified: Secondary | ICD-10-CM

## 2011-03-21 DIAGNOSIS — R002 Palpitations: Secondary | ICD-10-CM

## 2011-03-21 MED ORDER — METOPROLOL SUCCINATE ER 25 MG PO TB24: ORAL_TABLET | ORAL | Status: DC

## 2011-03-21 NOTE — Telephone Encounter (Signed)
PER PT PHARMACY DID NOT RECEIVE REFILL RESENT VIA EPIC  ALSO PER PT IS TAKING METOPROLOL 3 TABS EVERY DAY .

## 2011-03-23 ENCOUNTER — Telehealth: Payer: Self-pay | Admitting: Internal Medicine

## 2011-03-23 NOTE — Telephone Encounter (Signed)
[name], pls call her and give appt to see me next month to review everything so far

## 2011-04-03 LAB — CULTURE, RESPIRATORY W GRAM STAIN

## 2011-04-03 LAB — FUNGUS CULTURE W SMEAR: Fungal Smear: NONE SEEN

## 2011-04-03 LAB — AFB CULTURE WITH SMEAR (NOT AT ARMC)

## 2011-04-04 LAB — CBC
MCV: 78.8 fL (ref 78.0–100.0)
Platelets: 542 10*3/uL — ABNORMAL HIGH (ref 150–400)
RBC: 4.05 MIL/uL (ref 3.87–5.11)
WBC: 7.6 10*3/uL (ref 4.0–10.5)

## 2011-04-04 LAB — COMPREHENSIVE METABOLIC PANEL
ALT: 14 U/L (ref 0–35)
AST: 14 U/L (ref 0–37)
Albumin: 3.3 g/dL — ABNORMAL LOW (ref 3.5–5.2)
CO2: 24 mEq/L (ref 19–32)
Chloride: 108 mEq/L (ref 96–112)
Creatinine, Ser: 0.63 mg/dL (ref 0.4–1.2)
GFR calc Af Amer: >60 mL/min (ref >60)
GFR calc non Af Amer: >60 mL/min (ref >60)
Potassium: 4 mEq/L (ref 3.5–5.1)
Sodium: 139 mEq/L (ref 135–145)
Total Bilirubin: 0.5 mg/dL (ref 0.3–1.2)

## 2011-04-04 LAB — APTT: aPTT: 33 seconds (ref 24–37)

## 2011-04-07 NOTE — Telephone Encounter (Signed)
LMTCBx1 to set appointment.

## 2011-04-14 ENCOUNTER — Other Ambulatory Visit: Payer: Self-pay | Admitting: Internal Medicine

## 2011-04-17 ENCOUNTER — Encounter: Payer: Self-pay | Admitting: Cardiology

## 2011-04-18 ENCOUNTER — Ambulatory Visit (INDEPENDENT_AMBULATORY_CARE_PROVIDER_SITE_OTHER): Payer: BC Managed Care – PPO | Admitting: Cardiology

## 2011-04-18 ENCOUNTER — Encounter: Payer: Self-pay | Admitting: Cardiology

## 2011-04-18 DIAGNOSIS — I472 Ventricular tachycardia: Secondary | ICD-10-CM

## 2011-04-18 DIAGNOSIS — Z4502 Encounter for adjustment and management of automatic implantable cardiac defibrillator: Secondary | ICD-10-CM

## 2011-04-18 DIAGNOSIS — D869 Sarcoidosis, unspecified: Secondary | ICD-10-CM

## 2011-04-18 DIAGNOSIS — Z95810 Presence of automatic (implantable) cardiac defibrillator: Secondary | ICD-10-CM | POA: Insufficient documentation

## 2011-04-18 NOTE — Assessment & Plan Note (Signed)
ICD in place. Continue beta blocker. I will have her followup with Dr. Graciela Husbands.

## 2011-04-18 NOTE — Progress Notes (Signed)
HPI:40 year old female I saw in Feb 2012 for evaluation of palpitations. Patient with history of sarcoid as well. Echocardiogram in order 2012 revealed normal LV function. Although monitor revealed sinus rhythm with ventricular tachycardia. Cardiac MRI confirmed cardiac sarcoid.The patient seen by Dr. Graciela Husbands and ICD placed in March of 2012. Since then, the patient denies any dyspnea on exertion, orthopnea, PND, pedal edema, palpitations, syncope or chest pain.   Current Outpatient Prescriptions  Medication Sig Dispense Refill  . acetaZOLAMIDE (DIAMOX) 250 MG tablet Take 250 mg by mouth 2 (two) times daily.       . calcium gluconate 500 MG tablet Take 500 mg by mouth daily.        . Cholecalciferol (VITAMIN D3) 1000 UNITS CAPS Take 1 capsule by mouth daily.        . ferrous sulfate 325 (65 FE) MG tablet Take 325 mg by mouth daily with breakfast.        . folic acid (FOLVITE) 800 MCG tablet Take 800 mcg by mouth 2 (two) times daily.        Marland Kitchen ibuprofen (ADVIL,MOTRIN) 200 MG tablet Take 200 mg by mouth every 6 (six) hours as needed.        . methotrexate (RHEUMATREX) 2.5 MG tablet Take 10 mg by mouth once a week. Caution:Chemotherapy. Protect from light.       . metoprolol succinate (TOPROL-XL) 25 MG 24 hr tablet 2 EVERY AM AND 1 EVERY PM  90 tablet  11  . predniSONE (DELTASONE) 10 MG tablet Take 10 mg by mouth daily.        Marland Kitchen sulfamethoxazole-trimethoprim (BACTRIM DS) 800-160 MG per tablet TAKE 1 TABLET BY MOUTH EVERY MONDAY, WEDNESDAY AND FRIDAY  12 tablet  2  . topiramate (TOPAMAX) 25 MG tablet Take 25 mg by mouth 2 (two) times daily.           Past Medical History  Diagnosis Date  . Depression   . Pertussis   . Pseudotumor cerebri     .Marland KitchenMarland KitchenDr Hyacinth Meeker and Dr. Dione Booze - dxed Jan 2009, on diamox  - LP 01/24/2008 at [facility] - Normal ( pressure 14cm, gluc 68, Protein 23, Clear, colorless, RBC 3, WBC - too few to count)  - CT head non contrast done at [facility] 11/25/2007: "normal". Ddoes not report  MRI, MRA, MRV being done - 2nd opinon by Dr. Pearlean Brownie - reportdly had MRI/MRV both normal; followup pending  . Lupus pernio     -skin bx confirmed at [facility] 05/07/2009 (personally reviewed) -looks improved 07/09/2009 -  . Pulmonary sarcoidosis     - Cleda Daub 07/25/2010 on pred 10/mtx 10 -> FEv1 1.84L/775%, FVC 2.25L - SPiro 09/06/2010 -> Fev1 2.14L/81%. Cut pred to 5mg  per day. Cont mtx - SPiro 01/20/2011 - Fev1 1.6L/61%, Ratio 74%. CXR Worse -> increased pred to 10mg  perday. Cont Mtx.    . Vitamin D deficiency     - Aug 2011: 22 and low -> commence Rx. - Jan 2012: 11  . Ventricular tachycardia     Past Surgical History  Procedure Date  . 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 Cardiac defibrillator placement     [employer]    History   Social History  . Marital Status: Legally Separated    Spouse Name: N/A    Number of Children: 1  . Years of Education: N/A   Occupational History  . school counsler TEFL teacher)    Social History Main Topics  .  Smoking status: Former Smoker -- 15 years    Types: Cigarettes    Quit date: 03/03/2007  . Smokeless tobacco: Not on file  . Alcohol Use: Not on file  . Drug Use: Not on file  . Sexually Active: Not on file   Other Topics Concern  . Not on file   Social History Narrative  . No narrative on file    ROS: no fevers or chills, productive cough, hemoptysis, dysphasia, odynophagia, melena, hematochezia, dysuria, hematuria, rash, seizure activity, orthopnea, PND, pedal edema, claudication. Remaining systems are negative.  Physical Exam: Well-developed well-nourished in no acute distress.  Skin is warm and dry.  HEENT is normal.  Neck is supple. No thyromegaly.  Chest is clear to auscultation with normal expansion. ICD site without evidence of infection. Cardiovascular exam is regular rate and rhythm.  Abdominal exam nontender or distended. No masses palpated. Extremities show no edema. neuro grossly intact

## 2011-04-18 NOTE — Assessment & Plan Note (Signed)
Management per pulmonary. 

## 2011-04-18 NOTE — Assessment & Plan Note (Signed)
Management per electrophysiology. 

## 2011-04-25 ENCOUNTER — Ambulatory Visit (INDEPENDENT_AMBULATORY_CARE_PROVIDER_SITE_OTHER): Payer: BC Managed Care – PPO | Admitting: Internal Medicine

## 2011-04-25 ENCOUNTER — Encounter: Payer: Self-pay | Admitting: Internal Medicine

## 2011-04-25 DIAGNOSIS — I498 Other specified cardiac arrhythmias: Secondary | ICD-10-CM

## 2011-04-25 DIAGNOSIS — M7500 Adhesive capsulitis of unspecified shoulder: Secondary | ICD-10-CM

## 2011-04-25 DIAGNOSIS — L910 Hypertrophic scar: Secondary | ICD-10-CM

## 2011-04-25 DIAGNOSIS — I509 Heart failure, unspecified: Secondary | ICD-10-CM

## 2011-04-25 DIAGNOSIS — M7502 Adhesive capsulitis of left shoulder: Secondary | ICD-10-CM | POA: Insufficient documentation

## 2011-04-25 DIAGNOSIS — R002 Palpitations: Secondary | ICD-10-CM

## 2011-04-25 DIAGNOSIS — Z95810 Presence of automatic (implantable) cardiac defibrillator: Secondary | ICD-10-CM

## 2011-04-25 DIAGNOSIS — R000 Tachycardia, unspecified: Secondary | ICD-10-CM

## 2011-04-25 DIAGNOSIS — I472 Ventricular tachycardia: Secondary | ICD-10-CM

## 2011-04-25 MED ORDER — METOPROLOL SUCCINATE ER 25 MG PO TB24: ORAL_TABLET | ORAL | Status: DC

## 2011-04-25 NOTE — Patient Instructions (Signed)
Your physician recommends that you schedule a follow-up appointment in: 3 months.  Your physician has recommended you make the following change in your medication: increase metoprolol to 75mg  in the morning and 25mg  in the evening.  Please call us back when you decide about a plastic surgery referral.

## 2011-04-25 NOTE — Telephone Encounter (Signed)
Called, spoke with pt.  OV scheduled for May 18, 2011 at 3:15 with MR -- pt aware.

## 2011-04-25 NOTE — Assessment & Plan Note (Signed)
I will refer her to plastic surgery. I have given her a couple of names. She'll let us know whom she would like to follow with

## 2011-04-25 NOTE — Assessment & Plan Note (Signed)
Somewhat improved with a daytime heart rate average of about 100 and a nocturnal average about 70. We'll increase her metoprolol to 75/25 at the time of refill will make it 100 daily

## 2011-04-25 NOTE — Assessment & Plan Note (Signed)
I have reviewed with her the importance of pursuing physical therapy on her left shoulder. At this point he can continue to be done at home

## 2011-04-25 NOTE — Assessment & Plan Note (Signed)
Recurrent ventricular tachycardia although is below the therapy zone. It was self-limited and lasted 16 seconds. We will follow

## 2011-04-25 NOTE — Progress Notes (Signed)
HPI  Alexandra Hood is a 40 y.o. 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  She is status post ICD implantation  She also has a history of persistent sinus tachycardia  The patient denies chest pain, shortness of breath, nocturnal dyspnea, orthopnea or peripheral edema.  There have been no palpitations, lightheadedness or syncope.  She is walking daily.  She is being followed in pulmonary for her sarcoid. She is taking methotrexate and prednisone and Bactrim for immunosuppression protection  Past Medical History  Diagnosis Date  . Depression   . Pertussis   . Pseudotumor cerebri     .Marland KitchenMarland KitchenDr Hyacinth Meeker and Dr. Dione Booze - dxed Jan 2009, on diamox  - LP 01/24/2008 at [facility] - Normal ( pressure 14cm, gluc 68, Protein 23, Clear, colorless, RBC 3, WBC - too few to count)  - CT head non contrast done at [facility] 11/25/2007: "normal". Ddoes not report MRI, MRA, MRV being done - 2nd opinon by Dr. Pearlean Brownie - reportdly had MRI/MRV both normal; followup pending  . Lupus pernio     -skin bx confirmed at [facility] 05/07/2009 (personally reviewed) -looks improved 07/09/2009 -  . Pulmonary sarcoidosis     - Cleda Daub 07/25/2010 on pred 10/mtx 10 -> FEv1 1.84L/775%, FVC 2.25L - SPiro 09/06/2010 -> Fev1 2.14L/81%. Cut pred to 5mg  per day. Cont mtx - SPiro 01/20/2011 - Fev1 1.6L/61%, Ratio 74%. CXR Worse -> increased pred to 10mg  perday. Cont Mtx.    . Vitamin D deficiency     - Aug 2011: 22 and low -> commence Rx. - Jan 2012: 11  . Ventricular tachycardia     Past Surgical History  Procedure Date  . 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 Cardiac defibrillator placement     [employer]    Current Outpatient Prescriptions  Medication Sig Dispense Refill  . acetaZOLAMIDE (DIAMOX) 250 MG tablet Take 250 mg by mouth 2 (two) times daily.       . calcium gluconate 500 MG tablet Take 500 mg  by mouth daily.        . Cholecalciferol (VITAMIN D3) 1000 UNITS CAPS Take 1 capsule by mouth daily.        . ferrous sulfate 325 (65 FE) MG tablet Take 325 mg by mouth daily with breakfast.        . folic acid (FOLVITE) 800 MCG tablet Take 800 mcg by mouth 2 (two) times daily.        Marland Kitchen ibuprofen (ADVIL,MOTRIN) 200 MG tablet Take 200 mg by mouth every 6 (six) hours as needed.        . methotrexate (RHEUMATREX) 2.5 MG tablet Take 10 mg by mouth once a week. Caution:Chemotherapy. Protect from light.       . metoprolol succinate (TOPROL-XL) 25 MG 24 hr tablet 2 EVERY AM AND 1 EVERY PM  90 tablet  11  . predniSONE (DELTASONE) 10 MG tablet Take 10 mg by mouth daily.        Marland Kitchen sulfamethoxazole-trimethoprim (BACTRIM DS) 800-160 MG per tablet TAKE 1 TABLET BY MOUTH EVERY MONDAY, WEDNESDAY AND FRIDAY  12 tablet  2  . topiramate (TOPAMAX) 25 MG tablet Take 25 mg by mouth 2 (two) times daily.          No Known Allergies  Review of Systems negative except from HPI and PMH  Physical Exam Well developed and well  nourished in no acute distress HENT normal E scleral and icterus clear Neck Supple JVP flat; carotids brisk and full Clear to ausculation There is a small keloid over the ICD incision Regular rate and rhythm, no murmurs gallops or rub Soft with active bowel sounds No clubbing cyanosis and edema Alert and oriented, grossly normal motor and sensory function There is restricted motion of her left shoulder associated with some discomfort Skin Warm and Dry   Assessment and  Plan

## 2011-04-25 NOTE — Assessment & Plan Note (Signed)
The patient's device was interrogated and the information was fully reviewed.  The device was reprogrammed to maximize longevity; she isn't pacing.

## 2011-05-09 NOTE — Op Note (Signed)
NAME:  Alexandra Hood, Alexandra Hood      ACCOUNT NO.:  [id number]   MEDICAL RECORD NO.:  [id number]          PATIENT TYPE:  INP   LOCATION:  9304                          FACILITY:  [facility]   PHYSICIAN:  Duke Salvia. Marcelle Overlie, M.D.DATE OF BIRTH:  1971-01-05   DATE OF PROCEDURE:  03/02/2008  DATE OF DISCHARGE:                               OPERATIVE REPORT   PREOPERATIVE DIAGNOSES:  1. Pelvic pain.  2. 7 cm ovarian cyst, probable dermoid.   POSTOPERATIVE DIAGNOSES:  Right ovarian dermoid cyst.   PROCEDURE:  Laparotomy with right salpingo-oophorectomy.   SURGEON:  Duke Salvia. Marcelle Overlie, M.D.   ASSISTANT:  Juluis Mire, M.D.   ANESTHESIA:  General endotracheal.   COMPLICATIONS:  None.   DRAINS:  Foley catheter.   BLOOD LOSS:  Less than 100 mL.   PROCEDURE AND FINDINGS:  The patient taken to the operating room.  After  an adequate level of general anesthesia was obtained with the patient  supine, the abdomen prepped and usual manner for sterile abdominal  procedures.  Foley catheter positioned draining clear urine.  Transverse  incision was made excising her old scar.  This was carried down to the  fascia which was incised and extended transversely.  Rectus muscle  divided in the midline.  Peritoneum was entered superiorly without  incident, extended vertically.  There were some omental and sigmoid  colon adhesions to the margin of the left incision, but none that were  obstructing the view.  Once the peritoneum was opened, the right ovarian  cyst was elevated through the incision.  It was a smooth wall 7 cm  apparent dermoid cyst.  At her request, RSO was carried out by clamping  across the pedicle and first free tying followed by suture ligature of 0  Vicryl.  The remainder of the pelvic findings including the uterus and  posterior cul-de-sac area were unremarkable.  Upper abdomen was normal.  No other abnormalities noted except for the sigmoid adhesions again in  the left lateral area  which were not lysed since it did not cause any  deviation of the sigmoid colon.  Prior to closure sponge, needle and  instrument counts were reported as correct x2.  Peritoneum closed with a  running 2-0 Vicryl suture.  Fascia closed from laterally to midline on  either side with 0 PDS suture.  On-Q catheter subfascial and  subcutaneous were then positioned.  Steri-Strips and clips used on the  skin with a pressure dressing.  She tolerated this well, went to  recovery room in good condition.      Richard M. Marcelle Overlie, M.D.  Electronically Signed     RMH/MEDQ  D:  03/02/2008  T:  03/02/2008  Job:  [id number]

## 2011-05-09 NOTE — H&P (Signed)
NAME:  Alexandra Hood, Alexandra Hood NO.:  [id number]   MEDICAL RECORD NO.:  [id number]          PATIENT TYPE:  INP   LOCATION:  1521                         FACILITY:  [facility]   PHYSICIAN:  Della Goo, M.D. DATE OF BIRTH:  Aug 30, 1971   DATE OF ADMISSION:  01/29/2008  DATE OF DISCHARGE:                              HISTORY & PHYSICAL   CHIEF COMPLAINT:  Back and abdominal pain.   HISTORY OF PRESENT ILLNESS:  This is a 40 year old female presenting to  the emergency department secondary to complaints of severe back and  abdominal pain for the past 3 days.  She has been seen in the emergency  department recently on February 2, and February 4.  The patient has a  recent history of having a lumbar puncture performed by interventional  radiology 3 days ago for papilledema and an intractable headache since  November.  The patient suffered a headache afterward which was  different, which was associated with the lumbar puncture, and required a  blood patch to be placed the next day.  The patient returns to the  emergency department with complaints of pain in the back flank area  beside the supposed injection site and as well as complaints of pain in  the right lower quadrant of the abdomen.  When she describes this pain,  it is not a contiguous pain.  These are two separate areas that are  having discrete pain.  She does not have any spinous process tenderness  or pain along the central spinous bony processes.  The patient denies  having any nausea, vomiting, diarrhea, constipation.  She denies having  any dysuria and she also denies being on her menses currently.  She also  denies having any fever, chills, chest pain or shortness of breath.   PAST MEDICAL HISTORY:  1. Recent diagnosis of papilledema.  2. Vascular headache.  3. History of a teratoma seen previously on CT scan.Marland Kitchen   PAST SURGICAL HISTORY:  History of a left-sided oophorectomy with  exploratory laparotomy secondary  to a dermoid cyst in 2003.   MEDICATIONS:  Nadolol, acetazolamide and Percocet p.r.n.   ALLERGIES:  NO KNOWN DRUG ALLERGIES.   SOCIAL HISTORY:  Former smoker and former drinker.  No history of any  illicit drug usage.   FAMILY HISTORY:  Noncontributory.   PHYSICAL EXAMINATION:  GENERAL:  This is a 40 year old, well-nourished,  well-developed female in discomfort, but no acute distress.  VITAL SIGNS:  Temperature of 98.5, blood pressure 135/90, heart rate  102, respirations 18, O2 saturations 100%.  HEENT:  Normocephalic, atraumatic.  Pupils equally round, reactive to  light.  Extraocular muscles are intact.  Funduscopic benign.  Oropharynx  is clear.  NECK:  Supple.  Full range of motion.  No thyromegaly, adenopathy or  jugular venous distention.  CARDIOVASCULAR:  Regular rate and rhythm with mild tachycardia.  No  murmurs, gallops or rubs.  LUNGS:  Clear to auscultation bilaterally.  ABDOMEN:  Positive bowel sounds, soft, nontender, nondistended.  No  hepatosplenomegaly.  BACK:  Positive right-sided flank area tenderness, possible  costovertebral angle tenderness.  EXTREMITIES:  Without cyanosis, clubbing or edema.  NEUROLOGIC:  Alert and oriented, nonfocal.   LABORATORY DATA AND X-RAY FINDINGS:  Urine pregnancy test negative.  Urinalysis performed reveals small leukocytes, small blood, trace  ketones and urine microscopic reveals many bacteria and calcium oxalate  crystals.  A CBC had been performed on February 3, during her evaluation  in the ER.  At that time, a white blood cell count was 11.7, hemoglobin  10.4, hematocrit 31.3, platelets 381, neutrophils 81%.   CT of abdomen and pelvis revealed a pelvic teratoma which was smaller in  size compared to previous CT scanning.  A small amount of free pelvic  fluid and a normal appendix was seen.   ASSESSMENT:  65. A 40 year old female being admitted with intractable back      pain/flank pain.  2. Right lower quadrant  abdominal pain.  3. Pelvic teratoma.  4. Possible urinary tract infection versus nonobstructive      nephrolithiasis.  5. Recent papilledema.  6. Spinal tap headache syndrome.   PLAN:  The patient will be admitted and placed on IV fluids for  hydration.  Pain control therapy will be ordered.  An MRI study has also  been ordered of the lumbar spine, abdomen and pelvis.  Urine culture  will be sent secondary to the findings on the urinalysis and  microscopic.  The patient will be placed on IV Rocephin.  She will  continue on her regular medications at this time.  DVT and GI  prophylaxis have also been ordered.  Further evaluation will be  considered whether to be performed as an inpatient or an outpatient for  evaluation and treatment of the pelvic teratoma.      Della Goo, M.D.  Electronically Signed     HJ/MEDQ  D:  01/30/2008  T:  01/31/2008  Job:  [id number]

## 2011-05-09 NOTE — H&P (Signed)
NAME:  Alexandra Hood NO.:  [id number]   MEDICAL RECORD NO.:  [id number]          PATIENT TYPE:  INP   LOCATION:                                FACILITY:  [facility]   PHYSICIAN:  Duke Salvia. Marcelle Overlie, M.D.DATE OF BIRTH:  02/27/1971   DATE OF ADMISSION:  03/02/2008  DATE OF DISCHARGE:                              HISTORY & PHYSICAL   DATE OF SCHEDULED SURGERY:  March 9, [facility]   CHIEF COMPLAINT:  Pelvic pain, ovarian cyst.   HISTORY OF PRESENT ILLNESS:  A 40 year old G2, P1, who has had a prior  laparotomy for LSO for a dermoid in 2003, presented to the [facility]  ED several weeks ago, with severe abdominal pain.  A CT was ordered that  showed normal appendix and a right ovarian cyst 8.6 x 4.1 x 4.5, with  calcific areas consistent with a teratoma and a small uterine fibroid.  She has been taking narcotic pain medicines, and due to the severity of  the pain.  After discussion, her preference is to proceed with  laparotomy for RSO.  We have discussed possibility of hysterectomy at  same time, but her strong preference is to retain her uterus.  She  understands that she may have issues related to bleeding or breakthrough  bleeding, especially if she goes on HRT.  This procedure including risk  of bleeding, infection, transfusion, wound infection, phlebitis,  adjacent organ injury, all reviewed with her.  The need for HRT at her  age post-op was discussed, also.  She understands that she will not be  able to get pregnant biologically with her opposite ovary removed.   PAST MEDICAL HISTORY:  ALLERGIES:  NONE.   OPERATIONS:  Vaginal delivery in 1993, laparotomy with removal of her  left ovary, 2003.   CURRENT MEDICATIONS:  Diamox, Percocet p.r.n.  Pap smear married dated  February 06, 2008 was normal.   PHYSICAL EXAM:  VITAL SIGNS:  Temperature 98.2, blood pressure 120/78.  HEENT:  Unremarkable.  NECK:  Supple, without masses.  LUNGS:  Clear.  CARDIOVASCULAR:  Rate and rhythm without murmurs, rubs or gallops.  BREASTS:  Without masses.  ABDOMEN:  Soft, flat, nontender.  PELVIC:  Normal external genitalia, vagina and cervix clear.  Uterus mid-  position, normal size.  Fullness noted on the right side that was  difficult to delineate.   IMPRESSION:  A probable dermoid cyst, right ovary, history of same.   PLAN:  After discussion of options, her preference is laparotomy with  RSO.  Procedure and risks reviewed as above.      Richard M. Marcelle Overlie, M.D.  Electronically Signed     RMH/MEDQ  D:  02/28/2008  T:  02/28/2008  Job:  [id number]

## 2011-05-09 NOTE — Discharge Summary (Signed)
NAME:  Alexandra Hood, Alexandra Hood      ACCOUNT NO.:  [id number]   MEDICAL RECORD NO.:  [id number]          PATIENT TYPE:  INP   LOCATION:  9304                          FACILITY:  [facility]   PHYSICIAN:  Duke Salvia. Marcelle Overlie, M.D.DATE OF BIRTH:  Feb 23, 1971   DATE OF ADMISSION:  03/02/2008  DATE OF DISCHARGE:  03/04/2008                               DISCHARGE SUMMARY   DISCHARGE DIAGNOSIS:  1. Pelvic pain secondary to ovarian cyst, probable dermoid.  2. Laparotomy with right salpingo-oophorectomy this admission,      probable dermoid,  pathology pending.   SUMMARY OF HISTORY AND PHYSICAL EXAM:  Please see the admission H&P for  details.  Briefly, a 40 year old who was seen briefly in the [facility] ED  complaining of severe pelvic pain.  CT scan showed a 7.4 cm probable  dermoid cyst.  She has had a left dermoid cyst removed previously and  presents now for RSO.   HOSPITAL COURSE:  On March 9 under general anesthesia, the patient  underwent laparotomy with RSO, no other abnormalities were noted.  She  had minimal EBL. The first postoperative day hemoglobin was 7.5, the  incision was clean and dry.  Her diet was advanced.  She was ambulating  without difficulty and  was not having significant problems with  orthostatic changes.   By the second postop day, repeat hemoglobin was 7.1. Again her abdominal  exam is unremarkable.  There was no evidence of any subcutaneous  hematoma. The On-Q catheters were removed.  She was voiding without  difficulty, afebrile and not experiencing significant orthostasis and  was ready for discharge at that point.   LAB DATA:  Preop hemoglobin 11.4, WBC 7.8 that was on March 6. On March  10, WBC 12.9, hemoglobin 7.5. On March 11, WBC 7.6, hemoglobin 7.1.  Chemistries on admission February 28, 2008 were normal.  UPT was negative.  Blood type was B+, antibody screen was negative.   DISPOSITION:  The patient was discharged on Ferralet 90, iron once  daily, Tylox p.r.n.  pain.  Will return to the office in 2 days to have  her hemoglobin checked and also for clip removal.  Advised to report any  incisional redness or drainage, increased pain or bleeding or fever over  101.  She was given specific instructions regarding diet, sex and  exercise.   CONDITION:  Good.   ACTIVITY:  Graded increase.      Richard M. Marcelle Overlie, M.D.  Electronically Signed     RMH/MEDQ  D:  03/04/2008  T:  03/05/2008  Job:  [id number]

## 2011-05-09 NOTE — Op Note (Signed)
NAME:  Alexandra, Hood NO.:  [id number]   MEDICAL RECORD NO.:  [id number]          PATIENT TYPE:  AMB   LOCATION:  SDS                          FACILITY:  [facility]   PHYSICIAN:  Kalman Shan, MD   DATE OF BIRTH:  Jul 05, 1971   DATE OF PROCEDURE:  05/27/2009  DATE OF DISCHARGE:  05/27/2009                               OPERATIVE REPORT   Time of procedure is between 8 a.m. to 9 a.m. on May 27, 2009,  approximately.   TYPE OF PROCEDURE:  Bronchoscopy with planned transbronchial needle  aspiration with endobronchial ultrasound and also right upper lobe  lavage and right upper lobe transbronchial biopsies.   INDICATION:  To rule out sarcoidosis. Mediastinal adneopathy, lupus  pernio, Respiratory Infiltrates NEC   PREPROCEDURE ASSESSMENT:  Ms. Alexandra Hood is 40 year old.  History and physical is less than 31 days old.  She was last seen in the  office.  She has mediastinal lymphadenopathy consistent with sarcoid.  She has lupus pernio of her nose.  She has got some obesity and she also  has right upper lobe posterior segment infiltrate along with some  scattered infiltrates in the left upper lobe.  Clinically, this is  consistent with sarcoidosis.  She is also known to have a diagnosis of  benign intracranial hypertension.   Procedures considered low risk.  Recent coagulation parameters and lab  tests are all normal.   SEDATION PLAN:  Under general anesthesia to do the endobronchial  ultrasound and to do the bronchoscopy with biopsies.   LOCATION:  [facility] Operating Room of Dr. Edwyna Shell.   PROCEDURE NOTE:  After induction of general anesthesia, the  endobronchial ultrasound scope was passed through the endotracheal tube.  A detail lymph node evaluation was done.  The lymph node station and 4R  pretracheal node was well visualized, also subcarinal node in the  station 7R was also visualized.  There was also lymphadenopathy in the  station 10R, which is of the right upper lobe takeoff.  These lymph  nodes were seen on the CT scan and also visualized the endobronchial  ultrasound.  Subsequent to this, the endobronchial ultrasound  bronchoscope was stationed at [facility].  Two passes were done with Dr. Levy Pupa acting as my assistant.  These were sent for cytology.  Of note,  while doing other procedures, this returned with a diagnosis of  multinucleated giant cells and histiocytes that is consistent with  sarcoidosis.  Subsequently, biopsies were taken from the 10R station  with endobronchial ultrasound.  These were then sent for cytology.  The  subcarinal node was not biopsied because at this time we had gotten the  diagnosis on the pretracheal 4R node.   Following this, a detailed airway exam was done with the bronchoscope.  Of note, the endobronchial ultrasound was now switched to a regular  bronchoscope.  The airway exam was essentially normal.  Then, the right  upper lobe was lavaged with 40 mL x3.  The returns were bloody because  of some heme that had oozed out from the prior endobronchial ultrasound  needle biopsies.  Following this, transbronchial biopsies x5  were taken  from the right upper lobe, predominately from the posterior segment, but  also 1 from the apical segment.  These have being sent for analysis.  After this, the bronchoscope was withdrawn, and the procedure was  terminated.  Anesthesia was then reversed.   POSTPROCEDURE PLAN:  1. No complications.  2. Updated family - mom, dad and friend.  3. Await biopsy reports.  4. We will see her next week to discuss results and treatment.      Kalman Shan, MD  Electronically Signed     MR/MEDQ  D:  05/27/2009  T:  05/28/2009  Job:  [id number]   cc:   Leslye Peer, MD  Ines Bloomer, M.D.

## 2011-05-09 NOTE — Consult Note (Signed)
NAME:  Alexandra Hood, Alexandra Hood NO.:  [id number]   MEDICAL RECORD NO.:  [id number]          PATIENT TYPE:  AMB   LOCATION:  SDS                          FACILITY:  [facility]   PHYSICIAN:  Ines Bloomer, M.D. DATE OF BIRTH:  07-07-1971   DATE OF CONSULTATION:  DATE OF DISCHARGE:  05/27/2009                                 CONSULTATION   CHIEF COMPLAINT:  Dyspnea.   HISTORY OF PRESENT ILLNESS:  This is a 40 year old African American  female who has had multiple medical problems, she was admitted to the  hospital with a cough and lung lesions and chest pain.  She was seen in  consultation by Dr. Marchelle Gearing.  It was suspected that the patient had  got a CT scan that showed mediastinal adenopathy with a left upper lobe  lesion, a right upper lobe lesion.  She was discharged and is now  brought in for bronchoscopy and endobronchial ultrasound.   PAST MEDICAL HISTORY:  She has no allergies.  She has had a oophorectomy  in 2003 for a teratoma.  She has a blood patch for spinal headache after  LP.  On January 2009, she had a lumbar puncture just for headaches and  was found to have pseudotumor cerebri and then also in March 2009 she  had a right oophorectomy and fallopian tube removed.   FAMILY HISTORY:  Noncontributory.   MEDICATIONS:  Diamox 250 mg twice daily and an iron tablet.   She has no allergies.   SOCIAL HISTORY:  She used to smoke for 8-9 years, but quit smoking.  Does drink alcohol on a regular basis.   REVIEW OF SYSTEMS:  CARDIAC:  No angina, atrial fibrillation.  PULMONARY:  See history of present illness.  GASTROINTESTINAL:  No  nausea, vomiting, constipation, or diarrhea.  VASCULAR:  No  claudication, DVT, or TIAs.  HEMATOLOGIC:  No problems with bleeding or  clotting disorders or has anemia.  MUSCULOSKELETAL:  No arthritis or  muscular pain.  GU:  No kidney disease, dysuria, or frequent urination.  PSYCHIATRIC:  No depression or nervous.  NEUROLOGIC:   Headaches and  pseudotumors.   PHYSICAL EXAMINATION:  VITAL SIGNS:  Her blood pressure is 115/78, pulse  98, respirations 16, and sats were 98%.  HEAD, EARS, EYES, NOSE, THROAT:  Unremarkable.  NECK:  Supple without thyromegaly.  There is no supraclavicular or  axillary adenopathy.  CHEST:  Clear to auscultation and percussion.  HEART:  Regular sinus rhythm.  No murmurs.  ABDOMEN:  Soft.  There is no hepatosplenomegaly.  EXTREMITIES:  Pulses are 2+.  There is no clubbing or edema.  NEUROLOGIC:  He is oriented x3.  Sensory and motor intact.  Cranial  nerves intact.   IMPRESSION:  1. Bilateral pulmonary lesion with mediastinal adenopathy, rule out      pulmonary sarcoidosis.  2. Pseudotumor cerebri.  3. History of oophorectomy for teratoma.   PLAN:  Bronchoscopy and EBUS and follow up with Dr. Marchelle Gearing.      Ines Bloomer, M.D.  Electronically Signed     DPB/MEDQ  D:  05/27/2009  T:  05/28/2009  Job:  [id number]

## 2011-05-12 NOTE — Discharge Summary (Signed)
NAME:  Alexandra Hood, Alexandra Hood      ACCOUNT NO.:  [id number]   MEDICAL RECORD NO.:  [id number]          PATIENT TYPE:  INP   LOCATION:  9304                          [facility]ILITY[facility]   PHYSICIAN:  Duke Salvia. Marcelle Overlie, M.D.DATE OF BIRTH:  17-Mar-1971   DATE OF ADMISSION:  03/02/2008  DATE OF DISCHARGE:  03/04/2008                               DISCHARGE SUMMARY   DISCHARGE DIAGNOSES:  1. Large ovarian cyst probable dermoid.  2. Laparotomy with right salpingo-oophorectomy this admission.   For summary of the history and physical exam, please see admission H and  P for details.  Briefly, a 40 year old G2, P1, who has had a prior  laparotomy for LSO for a dermoid on the left side presented to the ED  recently with severe pain and was diagnosed with a 7-8 cm probable  dermoid cyst on the opposite side, presents now for laparotomy.   HOSPITAL COURSE:  On March 9, under general anesthesia, the patient  underwent laparotomy with RSO.  At her request, her uterus was  conserved.  No other abnormalities were noted.  She had a 100 mL blood  loss at the time of surgery.  On the first postoperative day, her  hemoglobin started at 11.4 and was 7.5 with a WBC of 12.9.  Followup on  03/11, the day of discharge, WBC 7.6, hemoglobin 7.1, hematocrit 21.2,  and platelets 324,000.  The first postop day, her catheter was removed.  Her diet was advanced.  The incision was clean and dry.  By the second  postop day, her on-Q catheters were removed.  Incision was clean and  dry.  She was afebrile, tolerating a regular diet.  Had established  normal bowel and urinary functions and was not orthostatic despite the  drop in hemoglobin with no evidence of abdominal hematoma formation and  was ready for discharge at that point.   On her lab data, CMET on admission was normal.  EPT was negative.  Blood  type is B+.  Antibiotic screen was negative.   DISPOSITION:  The patient was discharged on Tandem iron once daily,  Tylox p.r.n. pain or Motrin 800 p.o. nightly 8-12 hours p.r.n. pain.  She will return to the office in 3 days, have her hemoglobin checked and  also have the staples removed at that time.  Advised to report any  incisional redness or drainage.  In case of pain or bleeding or fever  over 101, she was given specific instructions regarding diet and  exercise.   CONDITION:  Good.   ACTIVITY:  Good.      Richard M. Marcelle Overlie, M.D.  Electronically Signed     RMH/MEDQ  D:  03/19/2008  T:  03/20/2008  Job:  [id number]

## 2011-05-12 NOTE — Discharge Summary (Signed)
NAME:  Alexandra Hood, Alexandra Hood NO.:  [id number]   MEDICAL RECORD NO.:  [id number]          PATIENT TYPE:  AMB   LOCATION:                                [facility]   PHYSICIAN:  Lonia Blood, M.D.       DATE OF BIRTH:  07-08-1971   DATE OF ADMISSION:  03/02/2008  DATE OF DISCHARGE:  03/02/2008                               DISCHARGE SUMMARY   PRIMARY CARE PHYSICIAN:  Albertine Patricia, MD, with [facility] on [address]   DISCHARGE DIAGNOSES:  1. 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  2. Chronic right lower quadrant pain of unclear etiology.  3. 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  4. Status post left oophorectomy.  5. Urinary tract infection, mild  6. Depression.   DISCHARGE MEDICATIONS:  1. Diamox 250 mg by mouth three times a day.  2. Nadolol 40 mg daily.  3. Percocet 5/325 every 6 hours as needed for pain.  4. Cipro 500 mg twice a day.  5. Vitamin B for nausea.   CONDITION ON DISCHARGE:  The patient is discharged in fair condition.  At the time of discharge, she is still having some right lower quadrant  pain.  The patient is instructed to follow up with her primary care  physician as well as with her primary gynecologist, Dr. Richarda Overlie.   PROCEDURES DURING THIS ADMISSION:  1. January 29, 2008, the patient underwent CT scan of abdomen and      pelvis with intravenous contrast with findings of a pelvic      teratoma, negative for appendicitis.  2. January 31, 2008, MRI of the pelvis with finding of a large 8 cm      pelvic mass most likely ovarian teratoma, uterine fibroids, free      pelvic fluid, inguinal lymph nodes.  3. January 30, 2008, MRI of the lumbosacral spine which is within      normal limits   CONSULTATIONS:  No consultations obtained.   HISTORY AND PHYSICAL:  Refer to the dictated H&P done by Dr. Della Goo on January 30, 2008.   HOSPITAL COURSE.:  #1.  RIGHT LOWER  QUADRANT ABDOMINAL PAIN, FLANK PAIN:  Alexandra Hood  presented to the emergency room with this complaint.  Her pain has been  chronic and has been exacerbated post lumbar puncture.  She had full  evaluation with MRI of the lumbar spine, MRI of the pelvis, CT scan of  the pelvis, and physical examinations, and I cannot say that I  understand exactly why she is having the pain.  In any case, the patient  is able to tolerate a regular diet.  She has good bowel movements, and  she is not septic or unstable.  She was started on low-dose oral opiate,  and she was told to follow up with her primary care physician, Dr. Albertine Patricia, as well as with her primary gynecologist, Dr. Richarda Overlie.   #2.  CHRONIC HEADACHES:  This is thought somehow related to the  patient's pseudotumor cerebri. I have upped to the  dose of Diamox 250 mg  two times a day and instructed the patient to follow up with her primary  neurologist.   #3.  PROBABLE URINARY TRACT INFECTION:  The patient was treated with 3  days of intravenous Rocephin, and she was switched to oral ciprofloxacin  for another week.      Lonia Blood, M.D.  Electronically Signed     SL/MEDQ  D:  02/03/2008  T:  02/04/2008  Job:  [id number]   cc:   Duke Salvia. Marcelle Overlie, M.D.  Fax: [phone]

## 2011-05-12 NOTE — Op Note (Signed)
NAME:  Alexandra Hood, Alexandra Hood NO.:  [id number]   MEDICAL RECORD NO.:  [id number]                   PATIENT TYPE:  INP   LOCATION:  9399                                 [facility]ACILITY[facility]   PHYSICIAN:  Duke Salvia. Marcelle Overlie, M.D.            DATE OF BIRTH:  08-04-1971   DATE OF PROCEDURE:  08/29/2002  DATE OF DISCHARGE:                                 OPERATIVE REPORT   PREOPERATIVE DIAGNOSES:  Pelvic pain, probable left dermoid cyst.   POSTOPERATIVE DIAGNOSES:  Pelvic pain, probable left dermoid cyst.   PROCEDURE:  Laparotomy with left oophorectomy.   SURGEON:  Duke Salvia. Marcelle Overlie, M.D.   ASSISTANT:  Raynald Kemp, M.D.   ANESTHESIA:  General endotracheal.   COMPLICATIONS:  None.   DRAINS:  Foley catheter.   ESTIMATED BLOOD LOSS:  100 cc.   PROCEDURE AND FINDINGS:  The patient went to the operating room.  After an  adequate level of general endotracheal anesthesia was obtained with the  patient supine, the abdomen was prepped and draped in the usual manner for  sterile abdominal procedures.  Foley catheter positioned draining clear  urine.  Transverse incision made two fingerbreadths above the symphysis,  carried down to the fascia which was incised and extended transversely.  Rectus muscles divided in the midline.  Peritoneum entered superiorly  without incident and extended vertically.  On exploration at that point a  large apparent smooth walled dermoid cyst of the left ovary was removed  through the incision.  Careful inspection revealed the left tube was normal.  I did not see any significant left ovarian residual that would allow any  conservation of the left ovary.  There was no excrescences.  There were no  ascites.  Her right tube and ovary were normal.  The cecum, appendix, and  the remainder of her upper abdominal examination was normal.  The cyst was  elevated and the base of the cyst between the tube and ovary were clamped  with Kelly clamps.   These were free tied followed by a suture ligature of 2-  0 Dexon with excellent hemostasis.  Again, the remainder on the right side  was normal.  The pelvis was irrigated with saline and aspirated.  The  operative site was hemostatic.  Prior to closure sponge, needle, and  instrument counts reported as correct x2.  Rectus muscles reapproximated  with 2-0 Dexon interrupted suture.  Fascia closed from laterally to midline  either side with a 0 PDS suture.  Subcutaneous fat was hemostatic.  Clips  and Steri-Strips were used on the skin.  She tolerated this well and went to  recovery room in good condition.  We opened the cyst after it was removed.  Typical appearance of hair and teeth consistent with a dermoid.  Richard M. Marcelle Overlie, M.D.   RMH/MEDQ  D:  08/29/2002  T:  08/29/2002  Job:  [id number]

## 2011-05-12 NOTE — Discharge Summary (Signed)
   NAME:  Alexandra Hood, Alexandra Hood NO.:  [id number]   MEDICAL RECORD NO.:  [id number]                   PATIENT TYPE:   LOCATION:                                       FACILITY:  [facility]   PHYSICIAN:  Theressa Millard, M.D.                 DATE OF BIRTH:  21-Jan-1971   DATE OF ADMISSION:  07/24/2002  DATE OF DISCHARGE:  07/27/2002                                 DISCHARGE SUMMARY   ADMITTING DIAGNOSES:  1. Nausea and vomiting.  2. Kidney infection.   DISCHARGE DIAGNOSES:  1. Pyelonephritis.  2. Nausea and vomiting secondary to #1.  3. Intrauterine pregnancy.   HISTORY OF PRESENT ILLNESS:  The patient is a 39 year old African American  who was admitted with nausea and vomiting and fever.  She was thought to  have had a urinary tract infection or kidney infection and was seen at [place]  at [facility].  She was given Cipro.  She could not hold the Cipro down  and so she came to the hospital, where she was admitted.   HOSPITAL COURSE:  She was initially treated with Rocephin and improved very  rapidly and was able to be changed to Augmentin and could tolerate that  without problem and was discharged in improved condition.   DISCHARGE MEDICATIONS:  1. Prenatal vitamins -- one daily.  2. Augmentin 875 mg b.i.d.   FOLLOWUP:  She was to make an appointment to see Dr. Otilio Connors. [place]  within the next week and then arrange followup with Dr. Ronda Fairly. [place]  after that.   DIET:  Diet as tolerated.   ACTIVITY:  Activities as tolerated.                                                Theressa Millard, M.D.    JO/MEDQ  D:  11/01/2002  T:  11/03/2002  Job:  [id number]

## 2011-05-12 NOTE — Discharge Summary (Signed)
   NAME:  Alexandra Hood, Alexandra Hood NO.:  [id number]   MEDICAL RECORD NO.:  [id number]                   PATIENT TYPE:  INP   LOCATION:  9322                                 [facility]   PHYSICIAN:  Duke Salvia. Marcelle Overlie, M.D.            DATE OF BIRTH:  [birth year]   DATE OF ADMISSION:  08/29/2002  DATE OF DISCHARGE:  08/31/2002                                 DISCHARGE SUMMARY   DISCHARGE DIAGNOSES:  1. Pelvic pain.  2. Ovarian cyst probable dermoid.  3. Laparotomy with left oophorectomy this admission.   HOSPITAL COURSE:  On September 5th under general anesthesia, the patient  underwent laparotomy with left oophorectomy.  There were no other abnormal  findings.  This large ovarian cyst was consistent with a dermoid cyst.  Final pathology is still pending.  The first postop day her diet was  advanced, her hemoglobin was 9.7, she was ambulating without difficulty and  remained afebrile.  By the second day, September 7th, she was feeling  stronger, again afebrile, the incision was clean and dry, abdominal exam was  unremarkable, and she was ready for discharge at that point.   LABORATORY DATA:  Preop hemoglobin was 10.6, postop was 9.7.  Admission UA  significant for a large hemoglobin, otherwise negative.  Blood type B  positive, antibody screen was negative.   DISCHARGE MEDICATIONS:  The patient discharged on Tylox p.r.n. pain.  She  will continue her oral contraceptive pills.  Hemocyte once daily for the  next month.   FOLLOW UP:  Will return to our office in four days to have the clips  removed.   DISCHARGE INSTRUCTIONS:  Vaginal __________ , incisional redness and  drainage, increased pain or bleeding, or fever over 101 she was given  specific instructions regarding that and sexual exercise.   CONDITION ON DISCHARGE:  Good.   ACTIVITY:  Gradually increase.                                               Richard M. Marcelle Overlie, M.D.    RMH/MEDQ  D:   08/31/2002  T:  08/31/2002  Job:  [id number]

## 2011-05-12 NOTE — H&P (Signed)
NAME:  Alexandra Hood, Alexandra Hood NO.:  [id number]   MEDICAL RECORD NO.:  [id number]                   PATIENT TYPE:  INP   LOCATION:  NA                                   [facility]   PHYSICIAN:  Duke Salvia. Marcelle Overlie, M.D.            DATE OF BIRTH:  08-15-1971   DATE OF ADMISSION:  08/29/2002  DATE OF DISCHARGE:                                HISTORY & PHYSICAL   CHIEF COMPLAINT:  Pelvic pain, ovarian cyst.   HISTORY OF PRESENT ILLNESS:  A 40 year old G3 P1 A2, currently on Yasmin,  early August was seen in the ED complaining of severe left lower quadrant  pain.  Initially was felt to have  UTI and was treated with antibiotics but  returned to the ED for continues problems with pain.  A CT scan showed a  10 x 12 cm ovarian cyst suspicious for a dermoid.  She was sent home on oral  narcotics and was seen in my office August 15.  Exam at that time was  consistent with a cystic mass in the left adnexa.  CA125 was drawn in our  office which was 37.3 which was slightly elevated.  A review of the CT  showed no evidence of renal calculi, a lobulated mass that had different  components consistent with some fatty areas and calcified components  suspicious for teeth consistent with a teratoma.  Laparotomy with LSO and  pelvic washings reviewed with her.  The approximately 1% chance of  malignancy discussed.  The procedure for a laparotomy with frozen section  and washing with either ovarian cystectomy or possible oophorectomy  reviewed.  Other risks relative to bleeding, infection, transfusion,  adjacent organ injury, the possible need for further or additional surgery  depending on the frozen section results discussed with her also.  She has  still required narcotics for pain relief; however, there has not been any  evidence of torsion.   ALLERGIES:  None.   CURRENT MEDICATIONS:  Oxycodone and Yasmin.   REVIEW OF SYSTEMS:  Significant for a prior UTI.  She has  had three  pregnancies; she has one living child and two miscarriages.  No prior other  surgery.   PHYSICAL EXAMINATION:  VITAL SIGNS:  Temperature 98.2, blood pressure  140/90.  HEENT:  Unremarkable.  NECK:  Supple without masses.  LUNGS:  Clear.  CARDIOVASCULAR:  Regular rate and rhythm without murmurs, rubs, gallops  noted.  BREASTS:  Without masses.  ABDOMEN:  Soft, flat, nontender.  PELVIC:  Normal external genitalia, vagina and cervix clear.  Uterus was mid  position, normal size.  There was a sensation of a cystic mass in the left  adnexa although it was difficult to delineate.  No unusual nodularity.  Mild  to moderate tenderness on palpation  EXTREMITIES AND NEUROLOGIC:  Unremarkable.    IMPRESSION:  Ovarian cyst, pelvic pain, probably teratoma.   PLAN:  Laparotomy with ovarian cystectomy or possible oophorectomy.  Procedure and risks reviewed as above.                                                Richard M. Marcelle Overlie, M.D.    RMH/MEDQ  D:  08/26/2002  T:  08/26/2002  Job:  [id number]

## 2011-05-18 ENCOUNTER — Ambulatory Visit (INDEPENDENT_AMBULATORY_CARE_PROVIDER_SITE_OTHER): Payer: BC Managed Care – PPO | Admitting: Internal Medicine

## 2011-05-18 ENCOUNTER — Encounter: Payer: Self-pay | Admitting: *Deleted

## 2011-05-18 ENCOUNTER — Encounter: Payer: Self-pay | Admitting: Internal Medicine

## 2011-05-18 DIAGNOSIS — I498 Other specified cardiac arrhythmias: Secondary | ICD-10-CM

## 2011-05-18 DIAGNOSIS — D869 Sarcoidosis, unspecified: Secondary | ICD-10-CM | POA: Insufficient documentation

## 2011-05-18 DIAGNOSIS — G932 Benign intracranial hypertension: Secondary | ICD-10-CM

## 2011-05-18 DIAGNOSIS — I472 Ventricular tachycardia: Secondary | ICD-10-CM

## 2011-05-18 DIAGNOSIS — E559 Vitamin D deficiency, unspecified: Secondary | ICD-10-CM

## 2011-05-18 MED ORDER — METHYLPREDNISOLONE 8 MG PO TABS: 8.0000 mg | ORAL_TABLET | Freq: Every day | ORAL | Status: DC

## 2011-05-18 NOTE — Assessment & Plan Note (Signed)
May 2012: reporting increased headaches after weight gain following prednisone for sarcoid. She will follow with Dr. Hyacinth Meeker and Dione Booze

## 2011-05-18 NOTE — Assessment & Plan Note (Signed)
In Jan 2012 levels were low. Will repepat Vit D at followup

## 2011-05-18 NOTE — Progress Notes (Signed)
Subjective:    Patient ID: Alexandra Hood, female    DOB: Jul 23, 1971, 40 y.o.   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  Last seen Jan 2012. At that time, noticed to be tachycardic. MRI and Holter revealed cardiac sarcoid. Now s/p ICD. ICD has never fired but still with baselinee tachcycardia. DR. Graciela Husbands titrating up on metoprolol but today HR still 113. She insists she is now compliant with prednisone 10mg  daily and 10mg  methtotrexate once a week and bactrim prophylaxis. Denies dyspnea or cough but admits to weight gain; this is giving her headaches of pseudotumor cerebrii. She went to [facility] for 2nd opinion of sarcoid but is unhappy with their expertise. Wants a sarcoid guru opinion but is reluctant to travel to [place], [place] or [place] to see the doctors there. No other issues. Last cXr was in marc 9. 2012 and showed stable pulmnary sarcoid. Last LFTs Jan 2012 and chemistries March 2012 - normal.     Review of Systems  Constitutional: Negative for fever and unexpected weight change.  HENT: Negative for ear pain, nosebleeds, congestion, sore throat, rhinorrhea, sneezing, trouble swallowing, dental problem, postnasal drip and sinus pressure.   Eyes: Negative for redness and itching.  Respiratory: Negative for cough, chest tightness, shortness of breath and wheezing.   Cardiovascular: Negative for palpitations and leg swelling.  Gastrointestinal: Negative for nausea and vomiting.  Genitourinary: Negative for dysuria.  Musculoskeletal: Negative for joint swelling.  Skin: Negative for rash.  Neurological: Negative for headaches.  Hematological: Does not bruise/bleed easily.  Psychiatric/Behavioral: Negative for dysphoric mood. The patient is not nervous/anxious.   HEADACHES + WEIGHT GAIN +     Objective:   Physical Exam  Vitals  reviewed. Constitutional: She is oriented to person, place, and time. She appears well-developed and well-nourished. No distress.       Obese Sunglass on as before  HENT:  Head: Normocephalic and atraumatic.  Right Ear: External ear normal.  Left Ear: External ear normal.  Mouth/Throat: Oropharynx is clear and moist. No oropharyngeal exudate.  Eyes: Conjunctivae and EOM are normal. Pupils are equal, round, and reactive to light. Right eye exhibits no discharge. Left eye exhibits no discharge. No scleral icterus.  Neck: Normal range of motion. Neck supple. No JVD present. No tracheal deviation present. No thyromegaly present.  Cardiovascular: Normal rate, regular rhythm, normal heart sounds and intact distal pulses.  Exam reveals no gallop and no friction rub.   No murmur heard. Pulmonary/Chest: Effort normal and breath sounds normal. No respiratory distress. She has no wheezes. She has no rales. She exhibits no tenderness.  Abdominal: Soft. Bowel sounds are normal. She exhibits no distension and no mass. There is no tenderness. There is no rebound and no guarding.  Musculoskeletal: Normal range of motion. She exhibits no edema and no tenderness.  Lymphadenopathy:    She has no cervical adenopathy.  Neurological: She is alert and oriented to person, place, and time. She has normal reflexes. No cranial nerve deficit. She exhibits normal muscle tone. Coordination normal.  Skin: Skin is warm and dry. No rash noted. She is not diaphoretic. No erythema. No pallor.       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 Her behavior is normal. Judgment and thought content normal.           Assessment &  Plan:

## 2011-05-18 NOTE — Patient Instructions (Signed)
Stop prednisone. Instead take medrol at 8mg  per day dose Continue methotrexate Continue bactrim Have breathing test today Your CXR in march suggested stable sarcoid lesion I have contacted Dr. Renette Butters at [facility] regarding Celgene study REturn to see me in 2 months No need for blood work today

## 2011-05-18 NOTE — Assessment & Plan Note (Signed)
Alexandra Hood 05/18/2011 - Fev1 2L/78% with CXR March 2012 showing stable disease. This is significantly better after increasing prednisone to 10mg  per day in Jan 2012 (Jan 2012 fev1 was 1.6L and had dropped from 2L in sept after we cut pred down to 5mg ). She is hesitant to take prednisone. Some friend took medrol and did not help gain that much weight. So, I have changed pred to mdrol 8mg  trial. ROV 2 months. Continue methotrexate and bactrim  She really does not want to to steroids. We discussed referral to sarcoid guru Dr Garth Schlatter and Dr. Gabriel Rainwater in [place] but she does not want to go. Infliximab might not be an option due to cardiac sarcoid. She is open to the celgene study. I have emailed Dr Lynnae January at [facility] to see if she can do that.

## 2011-05-18 NOTE — Assessment & Plan Note (Signed)
Looks resolved on may 2012 visit

## 2011-05-18 NOTE — Assessment & Plan Note (Signed)
On lopressor. But stil HR 113 on 05/18/2011. Will alert Dr. Graciela Husbands via EMR

## 2011-05-19 ENCOUNTER — Telehealth: Payer: Self-pay | Admitting: Internal Medicine

## 2011-05-19 NOTE — Progress Notes (Signed)
Heather could u please call and have her increase her toprol to 50 mg daily Thanks steve

## 2011-05-19 NOTE — Telephone Encounter (Signed)
Please call patient and state that I heard back from Dr. Epifania Gore at [facility]. The Celgene study: she does not qualify due to cardiac sarcoid. Also, the study is on hold due to technical issues. Therefore, nothing new to offer. However, she is still welcome to seek an opinion trhough insurance and paying for airfare and hotel with Dr. Epifania Gore at [facility] or Dr. Jones Broom at [facility]. I can arrange those whenever she is ready (she was not yesterday visit)

## 2011-05-26 NOTE — Telephone Encounter (Signed)
ATCx1, line was disconnected, WCB. Carron Curie, CMA

## 2011-05-26 NOTE — Progress Notes (Signed)
Per pt - at last office visit her Toprol 25 mg was increase to 75/25.  Will forward to Dr Graciela Husbands to review.

## 2011-06-01 NOTE — Telephone Encounter (Signed)
[email or web address].Rickeya Manus, CMA  

## 2011-06-04 NOTE — Progress Notes (Signed)
Plz have her increase it to 100mg  daily thnkas

## 2011-06-06 NOTE — Telephone Encounter (Signed)
[email or web address]. Khristin Keleher, CMA  

## 2011-06-09 ENCOUNTER — Encounter: Payer: Self-pay | Admitting: *Deleted

## 2011-06-09 NOTE — Telephone Encounter (Signed)
Unable to reach pt. I will mail a letter asking her to contact our office to discuss.Carron Curie, CMA

## 2011-07-18 ENCOUNTER — Encounter: Payer: Self-pay | Admitting: Internal Medicine

## 2011-07-18 ENCOUNTER — Ambulatory Visit (INDEPENDENT_AMBULATORY_CARE_PROVIDER_SITE_OTHER): Payer: BC Managed Care – PPO | Admitting: Internal Medicine

## 2011-07-18 ENCOUNTER — Encounter: Payer: Self-pay | Admitting: *Deleted

## 2011-07-18 VITALS — BP 130/72 | HR 86 | Temp 98.2°F | Ht 64.0 in | Wt 227.6 lb

## 2011-07-18 DIAGNOSIS — D869 Sarcoidosis, unspecified: Secondary | ICD-10-CM

## 2011-07-18 DIAGNOSIS — E559 Vitamin D deficiency, unspecified: Secondary | ICD-10-CM

## 2011-07-18 DIAGNOSIS — G932 Benign intracranial hypertension: Secondary | ICD-10-CM

## 2011-07-18 DIAGNOSIS — I498 Other specified cardiac arrhythmias: Secondary | ICD-10-CM

## 2011-07-18 NOTE — Progress Notes (Signed)
Subjective:    Patient ID: Alexandra Hood, female    DOB: 11/15/1971, 40 y.o.   MRN: [id number]  HPI 1. Stage 2 Pulmonary sarcoid   -pfts worsen when pred tapered  - s/p 2nd opinion 2010 with Dr Leta Speller at [facility] and 2012 at [facility] on steroids (medrol at her request since Jan 201) and methotrexate and bactrim   2. 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 Lupus Pernio (nasal bridge) 5. VIt D Deficiency. 6. Obesity and Pseudotumor cerebrii  OV 07/18/2011: Followup for above. Last seen May Jan 2012. At that time, stilli  Tachycardic. Dr Graciela Husbands increased lopressor in June 2012 and now normocardic. Now continuing medrol 8mg  per day instead of prednisone but headaches of pseudoetumor cerebrii persists on and off 2 tiimes per week at mild levels. Appointments with Dr. Dione Booze and [place] at [facility] still pending. Lupus pernio is resolved.  She insists she is  compliant with medrol 8mg  daily and 10mg  methtotrexate once a week and bactrim prophylaxis. Denies dyspnea or cough but admits to weight gain. No other issues. Last cXr was in marc 9. 2012 and showed stable pulmnary sarcoid. Last LFTs Jan 2012 and chemistries March 2012 - normal.   Past medical: per HPI Family hx: no change Social hx: moving to job a [employer]  Past Medical History  Diagnosis Date  . Depression   . Pertussis   . Pseudotumor cerebri     .Marland KitchenMarland KitchenDr Hyacinth Meeker and Dr. Dione Booze - dxed Jan 2009, on diamox  - LP 01/24/2008 at [facility] - Normal ( pressure 14cm, gluc 68, Protein 23, Clear, colorless, RBC 3, WBC - too few to count)  - CT head non contrast done at [facility] 11/25/2007: "normal". Ddoes not report MRI, MRA, MRV being done - 2nd opinon by Dr. Pearlean Brownie - reportdly had MRI/MRV both normal; followup pending  . Lupus pernio     -skin bx confirmed at [facility] 05/07/2009 (personally reviewed) -looks improved 07/09/2009 -  . Pulmonary  sarcoidosis     - Cleda Daub 07/25/2010 on pred 10/mtx 10 -> FEv1 1.84L/775%, FVC 2.25L - SPiro 09/06/2010 -> Fev1 2.14L/81%. Cut pred to 5mg  per day. Cont mtx - SPiro 01/20/2011 - Fev1 1.6L/61%, Ratio 74%. CXR Worse -> increased pred to 10mg  perday. Cont Mtx.    . Vitamin D deficiency     - Aug 2011: 22 and low -> commence Rx. - Jan 2012: 11  . Ventricular tachycardia      Family History  Problem Relation Age of Onset  . Allergies Mother      History   Social History  . Marital Status: Legally Separated    Spouse Name: N/A    Number of Children: 1  . Years of Education: N/A   Occupational History  . school counsler TEFL teacher)    Social History Main Topics  . Smoking status: Former Smoker -- 1.0 packs/day for 15 years    Types: Cigarettes    Quit date: 03/03/2007  . Smokeless tobacco: Not on file  . Alcohol Use: Not on file  . Drug Use: Not on file  . Sexually Active: Not on file   Other Topics Concern  . 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 Wants letter      No Known Allergies   Outpatient  Prescriptions Prior to Visit  Medication Sig Dispense Refill  . acetaZOLAMIDE (DIAMOX) 250 MG tablet Take 250 mg by mouth 2 (two) times daily.       . calcium gluconate 500 MG tablet Take 500 mg by mouth daily.        . Cholecalciferol (VITAMIN D3) 1000 UNITS CAPS Take 1 capsule by mouth daily.        . ferrous sulfate 325 (65 FE) MG tablet Take 325 mg by mouth daily with breakfast.        . folic acid (FOLVITE) 800 MCG tablet Take 800 mcg by mouth 2 (two) times daily.        Marland Kitchen ibuprofen (ADVIL,MOTRIN) 200 MG tablet Take 200 mg by mouth every 6 (six) hours as needed.        . metoprolol succinate (TOPROL-XL) 25 MG 24 hr tablet Take 3 tablets every morning and 1 tablet every evening  120 tablet  11  . sulfamethoxazole-trimethoprim (BACTRIM DS) 800-160 MG per tablet TAKE 1 TABLET BY MOUTH EVERY  MONDAY, WEDNESDAY AND FRIDAY  12 tablet  2  . topiramate (TOPAMAX) 25 MG tablet Take 25 mg by mouth 2 (two) times daily.        . methotrexate (RHEUMATREX) 2.5 MG tablet Take 10 mg by mouth once a week. Caution:Chemotherapy. Protect from light.       . methylPREDNISolone (MEDROL) 8 MG tablet Take 1 tablet (8 mg total) by mouth daily. Stop prednisone  30 tablet  3       Review of Systems  Constitutional: Negative for fever and unexpected weight change.  HENT: Negative for ear pain, nosebleeds, congestion, sore throat, rhinorrhea, sneezing, trouble swallowing, dental problem, postnasal drip and sinus pressure.   Eyes: Negative for redness and itching.  Respiratory: Negative for cough, chest tightness, shortness of breath and wheezing.   Cardiovascular: Negative for palpitations and leg swelling.  Gastrointestinal: Negative for nausea and vomiting.  Genitourinary: Negative for dysuria.  Musculoskeletal: Negative for joint swelling.  Skin: Negative for rash.  Neurological: Negative for headaches.  Hematological: Does not bruise/bleed easily.  Psychiatric/Behavioral: Negative for dysphoric mood. The patient is not nervous/anxious.        Objective:   Physical Exam    Physical Exam  Vitals reviewed. Constitutional: She is oriented to person, place, and time. She appears well-developed and well-nourished. No distress.       Obese Sunglass on as before  HENT:  Head: Normocephalic and atraumatic.  Right Ear: External ear normal.  Left Ear: External ear normal.  Mouth/Throat: Oropharynx is clear and moist. No oropharyngeal exudate.  Eyes: Conjunctivae and EOM are normal. Pupils are equal, round, and reactive to light. Right eye exhibits no discharge. Left eye exhibits no discharge. No scleral icterus.  Neck: Normal range of motion. Neck supple. No JVD present. No tracheal deviation present. No thyromegaly present.  Cardiovascular: Normal rate, regular rhythm, normal heart sounds and  intact distal pulses.  Exam reveals no gallop and no friction rub.   No murmur heard. Pulmonary/Chest: Effort normal and breath sounds normal. No respiratory distress. She has no wheezes. She has no rales. She exhibits no tenderness.  Abdominal: Soft. Bowel sounds are normal. She exhibits no distension and no mass. There is no tenderness. There is no rebound and no guarding.  Musculoskeletal: Normal range of motion. She exhibits no edema and no tenderness.  Lymphadenopathy:    She has no cervical adenopathy.  Neurological: She is alert and oriented to person,  place, and time. She has normal reflexes. No cranial nerve deficit. She exhibits normal muscle tone. Coordination normal.  Skin: Skin is warm and dry. No rash noted. She is not diaphoretic. No erythema. No pallor.       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 Her behavior is normal. Judgment and thought content normal.         Assessment & Plan:

## 2011-07-18 NOTE — Patient Instructions (Signed)
Continue your current medications. If you want your medrol dose cut down, call us Please see Dr Hyacinth Meeker for headaches  Return in 4 months - at that time do spirometry, lft and vitamin d levels

## 2011-07-25 ENCOUNTER — Other Ambulatory Visit: Payer: Self-pay | Admitting: Internal Medicine

## 2011-08-11 ENCOUNTER — Encounter: Payer: Self-pay | Admitting: Internal Medicine

## 2011-08-11 NOTE — Assessment & Plan Note (Signed)
Still having headaches on and off. Advised to see her physicians for it and make appointment with them

## 2011-08-11 NOTE — Assessment & Plan Note (Signed)
Appears resolved.  Monitor ?

## 2011-08-11 NOTE — Assessment & Plan Note (Signed)
Tolerating current regimen well with medrol, mtx and bactrim but medrol not preventing weight gain. Advised to continue same. At fu will check spiro, lft and chemistry

## 2011-08-11 NOTE — Assessment & Plan Note (Signed)
Continue Rx Check Vit D level next time

## 2011-08-11 NOTE — Assessment & Plan Note (Signed)
Appears controlled after Dr Graciela Husbands increased lopressor in June 2012. Continue to monitor

## 2011-08-15 ENCOUNTER — Encounter: Payer: BC Managed Care – PPO | Admitting: Internal Medicine

## 2011-08-18 ENCOUNTER — Encounter: Payer: Self-pay | Admitting: Internal Medicine

## 2011-08-18 ENCOUNTER — Ambulatory Visit (INDEPENDENT_AMBULATORY_CARE_PROVIDER_SITE_OTHER): Payer: BC Managed Care – PPO | Admitting: *Deleted

## 2011-08-18 ENCOUNTER — Other Ambulatory Visit: Payer: Self-pay | Admitting: Internal Medicine

## 2011-08-18 DIAGNOSIS — I472 Ventricular tachycardia: Secondary | ICD-10-CM

## 2011-08-18 DIAGNOSIS — I509 Heart failure, unspecified: Secondary | ICD-10-CM

## 2011-08-21 LAB — REMOTE ICD DEVICE
AL IMPEDENCE ICD: 570 Ohm
ATRIAL PACING ICD: 0.02 pct
BAMS-0001: 170 {beats}/min
PACEART VT: 0
RV LEAD THRESHOLD: 0.75 V
TOT-0006: 20120308000000
TZAT-0001ATACH: 1
TZAT-0001ATACH: 3
TZAT-0002ATACH: NEGATIVE
TZAT-0002ATACH: NEGATIVE
TZAT-0002ATACH: NEGATIVE
TZAT-0012ATACH: 150 ms
TZAT-0018ATACH: NEGATIVE
TZAT-0018ATACH: NEGATIVE
TZAT-0018SLOWVT: NEGATIVE
TZAT-0019ATACH: 6 V
TZAT-0019ATACH: 6 V
TZAT-0019ATACH: 6 V
TZAT-0019SLOWVT: 8 V
TZAT-0020FASTVT: 1.5 ms
TZAT-0020SLOWVT: 1.5 ms
TZON-0003SLOWVT: 360 ms
TZON-0004SLOWVT: 16
TZON-0004VSLOWVT: 32
TZON-0005SLOWVT: 12
TZST-0001ATACH: 5
TZST-0001ATACH: 6
TZST-0001FASTVT: 2
TZST-0001FASTVT: 3
TZST-0001SLOWVT: 3
TZST-0001SLOWVT: 6
TZST-0002ATACH: NEGATIVE
TZST-0002ATACH: NEGATIVE
TZST-0002ATACH: NEGATIVE
TZST-0002FASTVT: NEGATIVE
TZST-0002FASTVT: NEGATIVE
TZST-0002FASTVT: NEGATIVE
TZST-0002SLOWVT: NEGATIVE
TZST-0002SLOWVT: NEGATIVE
VENTRICULAR PACING ICD: 0.03 pct
VF: 0

## 2011-08-23 ENCOUNTER — Encounter: Payer: Self-pay | Admitting: *Deleted

## 2011-09-05 NOTE — Progress Notes (Signed)
ICD/ICM checked by remote. 

## 2011-09-15 LAB — DIFFERENTIAL
Basophils Absolute: 0
Basophils Absolute: 0
Lymphocytes Relative: 13
Lymphocytes Relative: 40
Lymphs Abs: 1.5
Lymphs Abs: 2.5
Monocytes Absolute: 0.5
Monocytes Absolute: 0.7
Monocytes Relative: 9
Neutro Abs: 3.2
Neutro Abs: 9.4 — ABNORMAL HIGH

## 2011-09-15 LAB — BASIC METABOLIC PANEL
Calcium: 8.4
GFR calc Af Amer: >60
GFR calc non Af Amer: >60
Sodium: 135

## 2011-09-15 LAB — URINE MICROSCOPIC-ADD ON

## 2011-09-15 LAB — CBC
HCT: 31.3 — ABNORMAL LOW
Hemoglobin: 9 — ABNORMAL LOW
MCHC: 33.3
MCV: 80.1
Platelets: 381
RBC: 3.33 — ABNORMAL LOW
RDW: 17 — ABNORMAL HIGH
RDW: 17.3 — ABNORMAL HIGH
WBC: 11.7 — ABNORMAL HIGH
WBC: 6.4

## 2011-09-15 LAB — LIPASE, BLOOD: Lipase: 37

## 2011-09-15 LAB — URINE CULTURE: Colony Count: 15000

## 2011-09-15 LAB — COMPREHENSIVE METABOLIC PANEL
AST: 10
Albumin: 3.6
BUN: 13
Calcium: 9.3
Chloride: 113 — ABNORMAL HIGH
Creatinine, Ser: 0.7
GFR calc Af Amer: >60
GFR calc non Af Amer: >60
Total Bilirubin: 0.5

## 2011-09-15 LAB — URINALYSIS, ROUTINE W REFLEX MICROSCOPIC
Bilirubin Urine: NEGATIVE
Ketones, ur: NEGATIVE
Nitrite: NEGATIVE
Protein, ur: 30 — AB
Protein, ur: NEGATIVE
Specific Gravity, Urine: 1.04 — ABNORMAL HIGH
Urobilinogen, UA: 0.2
Urobilinogen, UA: 0.2

## 2011-09-18 LAB — CBC
HCT: 21.2 — ABNORMAL LOW
Hemoglobin: 11.4 — ABNORMAL LOW
MCHC: 33.5
MCV: 82.1
RBC: 2.58 — ABNORMAL LOW
RBC: 2.7 — ABNORMAL LOW
RBC: 4.15
WBC: 12.9 — ABNORMAL HIGH
WBC: 7.6

## 2011-09-18 LAB — BASIC METABOLIC PANEL
GFR calc non Af Amer: >60
Glucose, Bld: 107 — ABNORMAL HIGH
Potassium: 3.7
Sodium: 139

## 2011-09-18 LAB — TYPE AND SCREEN: Antibody Screen: NEGATIVE

## 2011-10-02 ENCOUNTER — Telehealth: Payer: Self-pay | Admitting: Internal Medicine

## 2011-10-02 ENCOUNTER — Other Ambulatory Visit: Payer: Self-pay | Admitting: Internal Medicine

## 2011-10-02 NOTE — Telephone Encounter (Signed)
msg left to keep app, missed last OV,

## 2011-10-02 NOTE — Telephone Encounter (Signed)
Pt called. Pt wanted to know does she need to come in for appointment tomorrow please let her know

## 2011-10-03 ENCOUNTER — Encounter: Payer: Self-pay | Admitting: Internal Medicine

## 2011-10-03 ENCOUNTER — Ambulatory Visit (INDEPENDENT_AMBULATORY_CARE_PROVIDER_SITE_OTHER): Payer: BC Managed Care – PPO | Admitting: Internal Medicine

## 2011-10-03 DIAGNOSIS — I509 Heart failure, unspecified: Secondary | ICD-10-CM

## 2011-10-03 DIAGNOSIS — L910 Hypertrophic scar: Secondary | ICD-10-CM

## 2011-10-03 DIAGNOSIS — Z95810 Presence of automatic (implantable) cardiac defibrillator: Secondary | ICD-10-CM

## 2011-10-03 DIAGNOSIS — M7502 Adhesive capsulitis of left shoulder: Secondary | ICD-10-CM

## 2011-10-03 DIAGNOSIS — I498 Other specified cardiac arrhythmias: Secondary | ICD-10-CM

## 2011-10-03 DIAGNOSIS — I472 Ventricular tachycardia: Secondary | ICD-10-CM

## 2011-10-03 DIAGNOSIS — M7500 Adhesive capsulitis of unspecified shoulder: Secondary | ICD-10-CM

## 2011-10-03 LAB — ICD DEVICE OBSERVATION
AL IMPEDENCE ICD: 513 Ohm
BAMS-0001: 170 {beats}/min
CHARGE TIME: 8.5 s
DEV-0020ICD: NEGATIVE
RV LEAD THRESHOLD: 1 V
TZAT-0001ATACH: 1
TZAT-0002ATACH: NEGATIVE
TZAT-0002ATACH: NEGATIVE
TZAT-0012ATACH: 150 ms
TZAT-0012FASTVT: 170 ms
TZAT-0018ATACH: NEGATIVE
TZAT-0018ATACH: NEGATIVE
TZAT-0018SLOWVT: NEGATIVE
TZAT-0019ATACH: 6 V
TZAT-0019ATACH: 6 V
TZAT-0019SLOWVT: 8 V
TZAT-0020ATACH: 1.5 ms
TZAT-0020FASTVT: 1.5 ms
TZAT-0020SLOWVT: 1.5 ms
TZON-0003SLOWVT: 360 ms
TZST-0001ATACH: 5
TZST-0001FASTVT: 2
TZST-0001FASTVT: 3
TZST-0001SLOWVT: 3
TZST-0001SLOWVT: 5
TZST-0001SLOWVT: 6
TZST-0002ATACH: NEGATIVE
TZST-0002ATACH: NEGATIVE
TZST-0002ATACH: NEGATIVE
TZST-0002FASTVT: NEGATIVE
TZST-0002FASTVT: NEGATIVE
TZST-0002FASTVT: NEGATIVE
TZST-0002SLOWVT: NEGATIVE
TZST-0002SLOWVT: NEGATIVE

## 2011-10-03 NOTE — Patient Instructions (Signed)
Your physician wants you to follow-up in: 1 YEAR You will receive a reminder letter in the mail two months in advance. If you don't receive a letter, please call our office to schedule the follow-up appointment.  

## 2011-10-03 NOTE — Assessment & Plan Note (Signed)
Have given her a referral to plastic surgery

## 2011-10-03 NOTE — Assessment & Plan Note (Signed)
improved

## 2011-10-03 NOTE — Assessment & Plan Note (Signed)
Multiple episodes of nonsustained ventricular tachycardia; and one episode of ventricular tachycardia at a cycle length of 400 ms it persisted for 20 seconds and terminated it occur the monitoring zone

## 2011-10-03 NOTE — Assessment & Plan Note (Signed)
Persisting but well tolerated; continue on beta blocker

## 2011-10-03 NOTE — Assessment & Plan Note (Signed)
The patient's device was interrogated.  The information was reviewed. No changes were made in the programming.    

## 2011-10-03 NOTE — Progress Notes (Signed)
HPI  Alexandra Hood is a 40 y.o. 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  She is status post ICD implantation  She also has a history of persistent sinus tachycardia  The patient denies chest pain, shortness of breath, nocturnal dyspnea, orthopnea or peripheral edema.  There have been no palpitations, lightheadedness or syncope.  She is walking daily. 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 She is taking methotrexate and prednisone and Bactrim for immunosuppression protection  Past Medical History  Diagnosis Date  . Depression   . Pertussis   . Pseudotumor cerebri     .Marland KitchenMarland KitchenDr Hyacinth Meeker and Dr. Dione Booze - dxed Jan 2009, on diamox  - LP 01/24/2008 at [facility] - Normal ( pressure 14cm, gluc 68, Protein 23, Clear, colorless, RBC 3, WBC - too few to count)  - CT head non contrast done at [facility] 11/25/2007: "normal". Ddoes not report MRI, MRA, MRV being done - 2nd opinon by Dr. Pearlean Brownie - reportdly had MRI/MRV both normal; followup pending  . Lupus pernio     -skin bx confirmed at [facility] 05/07/2009 (personally reviewed) -looks improved 07/09/2009 -  . Pulmonary sarcoidosis     - Cleda Daub 07/25/2010 on pred 10/mtx 10 -> FEv1 1.84L/775%, FVC 2.25L - SPiro 09/06/2010 -> Fev1 2.14L/81%. Cut pred to 5mg  per day. Cont mtx - SPiro 01/20/2011 - Fev1 1.6L/61%, Ratio 74%. CXR Worse -> increased pred to 10mg  perday. Cont Mtx.    . Vitamin D deficiency     - Aug 2011: 22 and low -> commence Rx. - Jan 2012: 11  . Ventricular tachycardia     Past Surgical History  Procedure Date  . 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 Cardiac defibrillator placement     Medtronic    Current Outpatient Prescriptions  Medication Sig Dispense Refill  . acetaZOLAMIDE (DIAMOX) 250 MG tablet Take 250 mg by mouth 2  (two) times daily.       . calcium gluconate 500 MG tablet Take 500 mg by mouth daily.        . Cholecalciferol (VITAMIN D3) 1000 UNITS CAPS Take 1 capsule by mouth daily.        . ferrous sulfate 325 (65 FE) MG tablet Take 325 mg by mouth daily with breakfast.        . folic acid (FOLVITE) 800 MCG tablet Take 800 mcg by mouth 2 (two) times daily.        Marland Kitchen ibuprofen (ADVIL,MOTRIN) 200 MG tablet Take 200 mg by mouth every 6 (six) hours as needed.        . methotrexate (RHEUMATREX) 2.5 MG tablet TAKE 4 TABLETS BY MOUTH EVERY MONDAY  16 tablet  2  . methylPREDNISolone (MEDROL) 8 MG tablet Take 8 mg by mouth daily. Stop prednisone       . metoprolol succinate (TOPROL-XL) 25 MG 24 hr tablet Take 3 tablets every morning and 1 tablet every evening  120 tablet  11  . sulfamethoxazole-trimethoprim (BACTRIM DS) 800-160 MG per tablet TAKE 1 TABLET BY MOUTH EVERY MONDAY, WEDNESDAY AND FRIDAY  12 tablet  2  . topiramate (TOPAMAX) 25 MG tablet Take 25 mg by mouth 2 (two) times daily.        Marland Kitchen DISCONTD: methylPREDNISolone (MEDROL) 8 MG tablet Take 1 tablet (8 mg  total) by mouth daily. Stop prednisone  30 tablet  3    No Known Allergies  Review of Systems negative except from HPI and PMH  Physical Exam Well developed and well nourished in no acute distress HENT normal E scleral and icterus clear Neck Supple JVP flat; carotids brisk and full Clear to ausculation There is a small keloid over the ICD incision Regular rate that is somewhat rapid and rhythm, no murmurs gallops or rub Soft with active bowel sounds No clubbing cyanosis and edema Alert and oriented, grossly normal motor and sensory function There is restricted motion of her left shoulder associated with some discomfort Skin Warm and Dry    Assessment and  Plan

## 2011-11-23 ENCOUNTER — Encounter: Payer: BC Managed Care – PPO | Admitting: *Deleted

## 2011-12-06 ENCOUNTER — Encounter: Payer: Self-pay | Admitting: Internal Medicine

## 2011-12-06 ENCOUNTER — Ambulatory Visit (INDEPENDENT_AMBULATORY_CARE_PROVIDER_SITE_OTHER): Payer: BC Managed Care – PPO | Admitting: Internal Medicine

## 2011-12-06 ENCOUNTER — Other Ambulatory Visit (INDEPENDENT_AMBULATORY_CARE_PROVIDER_SITE_OTHER): Payer: BC Managed Care – PPO

## 2011-12-06 VITALS — BP 110/78 | HR 90 | Temp 98.4°F | Ht 64.0 in | Wt 223.4 lb

## 2011-12-06 DIAGNOSIS — D869 Sarcoidosis, unspecified: Secondary | ICD-10-CM

## 2011-12-06 DIAGNOSIS — E559 Vitamin D deficiency, unspecified: Secondary | ICD-10-CM

## 2011-12-06 LAB — CBC WITH DIFFERENTIAL/PLATELET
Eosinophils Relative: 0.1 % (ref 0.0–5.0)
HCT: 37.9 % (ref 36.0–46.0)
Hemoglobin: 12.5 g/dL (ref 12.0–15.0)
Lymphs Abs: 0.9 10*3/uL (ref 0.7–4.0)
Monocytes Relative: 7.6 % (ref 3.0–12.0)
Neutro Abs: 9.1 10*3/uL — ABNORMAL HIGH (ref 1.4–7.7)
WBC: 10.9 10*3/uL — ABNORMAL HIGH (ref 4.5–10.5)

## 2011-12-06 MED ORDER — METHYLPREDNISOLONE 8 MG PO TABS: 4.0000 mg | ORAL_TABLET | Freq: Every day | ORAL | Status: DC

## 2011-12-06 MED ORDER — METHOTREXATE 2.5 MG PO TABS: 2.5000 mg | ORAL_TABLET | ORAL | Status: DC

## 2011-12-06 NOTE — Progress Notes (Signed)
Subjective:    Patient ID: Alexandra Hood, female    DOB: 1971/03/24, 40 y.o.   MRN: [id number]  HPI 1. Stage 2 Pulmonary sarcoid   -pfts worsen when pred tapered  - s/p 2nd opinion 2010 with Alexandra Alexandra Hood at [facility] and 2012 at [facility] on steroids (medrol at her request since Jan 201) and methotrexate and bactrim   2. Cardiac sarcoid Alexandra Hood, s/p ICD march 2012)  3. 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 Lupus Pernio (nasal bridge) 5. VIt D Deficiency. 6. Obesity and Pseudotumor cerebrii  OV 07/18/2011: Followup for above. Last seen May Jan 2012. At that time, stilli  Tachycardic. Alexandra Alexandra Hood increased lopressor in June 2012 and now normocardic. Now continuing medrol 8mg  per day instead of prednisone but headaches of pseudoetumor cerebrii persists on and off 2 tiimes per week at mild levels. Appointments with Alexandra. Dione Hood and Alexandra Hood at [facility] still pending. Lupus pernio is resolved.  She insists she is  compliant with medrol 8mg  daily and 10mg  methtotrexate once a week and bactrim prophylaxis. Denies dyspnea or cough but admits to weight gain. No other issues. Last cXr was in marc 9. 2012 and showed stable pulmnary sarcoid. Last LFTs Jan 2012 and chemistries March 2012 - normal.   REC Continue your current medications. If you want your medrol dose cut down, call us  Please see Alexandra Hood for headaches  Return in 4 months - at that time do spirometry, lft and vitamin d levels   OV 12/06/2011 Follow up for above. Last seen July 2012. Is a 6 month followup. Feels great. Looking forward to holidays.. No active complaints. Tolerating methotrexate, Medrol and Bactrim for sarcoidosis quite well. Her ICD has not fired and she's not having tachycardia from a cardiac sarcoid. She is due to see Alexandra. Graciela Hood soon. The keloid from  her ICD is persistent for is not bothering her. Her lupus pernio in her nose is still in remission.  Her headaches from pseudotumor cerebri continue on off but  they are better. She feels that Medrol works better than prednisone in terms of her mood and weight. She has not had blood tests since spring 2012. She says she is compliant with her medications including vitamin D for vitamin D deficiency.  Spirometry today shows Fev1 2.02L/79%. Ratio - normal.  This is essentially baseline for her  Past medical: per HPI. 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  HENT: Negative for ear pain, nosebleeds, congestion, sore throat, rhinorrhea, sneezing, trouble swallowing, dental problem, postnasal drip and sinus pressure.   Eyes: Negative for redness and itching.  Respiratory: Negative for cough, chest tightness, shortness of breath and wheezing.   Cardiovascular: Negative for palpitations and leg swelling.  Gastrointestinal: Negative for nausea and vomiting.  Genitourinary: Negative for dysuria.  Musculoskeletal: Negative for joint swelling.  Skin: Negative for rash.  Neurological: Negative for headaches.  Hematological: Does not bruise/bleed easily.  Psychiatric/Behavioral: Negative for dysphoric mood. The patient is not nervous/anxious.        Objective:   Physical Exam Vitals reviewed. Constitutional: She is oriented to person, place, and time. She appears well-developed and well-nourished. No distress.       Obese Sunglass on as before  HENT:  Head: Normocephalic and atraumatic.  Right Ear: External ear normal.  Left Ear: External ear normal.  Mouth/Throat: Oropharynx is clear and moist. No oropharyngeal exudate.  Eyes: Conjunctivae and EOM are normal. Pupils are equal, round, and reactive to light. Right eye exhibits no discharge. Left eye exhibits no discharge. No scleral icterus.  Neck: Normal range of motion. Neck supple. No JVD present. No tracheal deviation present.  No thyromegaly present.  Cardiovascular: Normal rate, regular rhythm, normal heart sounds and intact distal pulses.  Exam reveals no gallop and no friction rub.   No murmur heard. Pulmonary/Chest: Effort normal and breath sounds normal. No respiratory distress. She has no wheezes. She has no rales. She exhibits no tenderness.  Abdominal: Soft. Bowel sounds are normal. She exhibits no distension and no mass. There is no tenderness. There is no rebound and no guarding.  Musculoskeletal: Normal range of motion. She exhibits no edema and no tenderness.  Lymphadenopathy:    She has no cervical adenopathy.  Neurological: She is alert and oriented to person, place, and time. She has normal reflexes. No cranial nerve deficit. She exhibits normal muscle tone. Coordination normal.  Skin: Skin is warm and dry. No rash noted. She is not diaphoretic. No erythema. No pallor.       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 Her behavior is normal. Judgment and thought content normal.         Assessment & Plan:

## 2011-12-06 NOTE — Patient Instructions (Signed)
#  Sarcoid Your lung function is 2L/79% and nearly normal Glad you are feeling well Please cut down medrol dose to 4mg  per day Continue  Methotrexate at 10mg  per week; depending on your test results we might cut this down further Continue bactrim as before Have blood test today for cbcd, lft, bmet - will call you with results #VIt D Def - continue vit d replacement - recheck level today #followup  - 5 months - spirometry at followup

## 2011-12-06 NOTE — Assessment & Plan Note (Signed)
Resolved December 2012 visit we'll continue to clinically monitor

## 2011-12-06 NOTE — Assessment & Plan Note (Signed)
check vitamin D levels today

## 2011-12-06 NOTE — Assessment & Plan Note (Signed)
Sarcoidosis is stable. Lung function is essentially normal and at baseline. This is on regimen of Medrol 8 mg, methotrexate 10 mg once a week and Bactrim prophylaxis 3 times a week. No obvious evidence of complication from treatment. We will check blood work including complete blood count and liver function tests today and will call her with results. She is now completed 2-1/2 years of immunomodulatory treatment therefore we'll reduce Medrol to 4 mg per day and based on test results we'll plan to cut down methotrexate to 7.5 mg once a week. I suspect she will need long-term immunomodulatory treatment given cardiac sarcoid

## 2011-12-07 LAB — BASIC METABOLIC PANEL
Calcium: 9.6 mg/dL (ref 8.4–10.5)
GFR: 94.75 mL/min (ref >60.00)
Glucose, Bld: 91 mg/dL (ref 70–99)
Sodium: 140 mEq/L (ref 135–145)

## 2011-12-07 LAB — HEPATIC FUNCTION PANEL
ALT: 26 U/L (ref 0–35)
AST: 19 U/L (ref 0–37)
Albumin: 3.9 g/dL (ref 3.5–5.2)
Total Bilirubin: 0.1 mg/dL — ABNORMAL LOW (ref 0.3–1.2)

## 2011-12-11 ENCOUNTER — Telehealth: Payer: Self-pay | Admitting: Internal Medicine

## 2011-12-11 MED ORDER — METHOTREXATE 2.5 MG PO TABS: ORAL_TABLET | ORAL | Status: DC

## 2011-12-11 NOTE — Telephone Encounter (Signed)
I spoke with [facility] and was advised that they received rx for pt methotrexate 2.5 mg once a week. Is this correct. I advised them according to MR's note it should be 10mg  once a week and gave verbal order for this. Since pt has picked this up she will need to bring her bottle back to the pharmacy. LMOMTCB for pt to make ehr aware of this

## 2011-12-12 NOTE — Telephone Encounter (Signed)
Pt has been notified of error on her prescription and will take her bottle back to the pharmacy to get the corrected quantity.

## 2011-12-12 NOTE — Telephone Encounter (Signed)
LMOMTCB x 1 

## 2012-01-04 ENCOUNTER — Ambulatory Visit (INDEPENDENT_AMBULATORY_CARE_PROVIDER_SITE_OTHER): Payer: BC Managed Care – PPO | Admitting: *Deleted

## 2012-01-04 ENCOUNTER — Encounter: Payer: Self-pay | Admitting: Internal Medicine

## 2012-01-04 ENCOUNTER — Other Ambulatory Visit: Payer: Self-pay | Admitting: Internal Medicine

## 2012-01-04 DIAGNOSIS — I472 Ventricular tachycardia, unspecified: Secondary | ICD-10-CM

## 2012-01-04 DIAGNOSIS — I509 Heart failure, unspecified: Secondary | ICD-10-CM

## 2012-01-04 DIAGNOSIS — I498 Other specified cardiac arrhythmias: Secondary | ICD-10-CM

## 2012-01-07 LAB — REMOTE ICD DEVICE
AL AMPLITUDE: 3 mv
AL IMPEDENCE ICD: 494 Ohm
FVT: 0
RV LEAD AMPLITUDE: 4.1 mv
RV LEAD IMPEDENCE ICD: 399 Ohm
TOT-0001: 1
TOT-0002: 1
TOT-0006: 20120308000000
TZAT-0001FASTVT: 1
TZAT-0001SLOWVT: 1
TZAT-0002ATACH: NEGATIVE
TZAT-0002FASTVT: NEGATIVE
TZAT-0012ATACH: 150 ms
TZAT-0012ATACH: 150 ms
TZAT-0012FASTVT: 170 ms
TZAT-0012SLOWVT: 170 ms
TZAT-0018ATACH: NEGATIVE
TZAT-0018FASTVT: NEGATIVE
TZAT-0019ATACH: 6 V
TZAT-0019ATACH: 6 V
TZAT-0020ATACH: 1.5 ms
TZAT-0020ATACH: 1.5 ms
TZAT-0020ATACH: 1.5 ms
TZON-0003ATACH: 350 ms
TZON-0003VSLOWVT: 430 ms
TZST-0001ATACH: 4
TZST-0001FASTVT: 4
TZST-0001FASTVT: 6
TZST-0001SLOWVT: 2
TZST-0001SLOWVT: 3
TZST-0001SLOWVT: 4
TZST-0001SLOWVT: 5
TZST-0002FASTVT: NEGATIVE
TZST-0002FASTVT: NEGATIVE
TZST-0002FASTVT: NEGATIVE
TZST-0002SLOWVT: NEGATIVE
TZST-0002SLOWVT: NEGATIVE

## 2012-01-11 NOTE — Progress Notes (Signed)
Remote icd w/icm  

## 2012-01-16 ENCOUNTER — Encounter: Payer: Self-pay | Admitting: *Deleted

## 2012-01-25 ENCOUNTER — Encounter: Payer: Self-pay | Admitting: Internal Medicine

## 2012-01-25 ENCOUNTER — Ambulatory Visit (INDEPENDENT_AMBULATORY_CARE_PROVIDER_SITE_OTHER): Payer: BC Managed Care – PPO | Admitting: *Deleted

## 2012-01-25 DIAGNOSIS — I509 Heart failure, unspecified: Secondary | ICD-10-CM

## 2012-01-27 LAB — REMOTE ICD DEVICE
AL IMPEDENCE ICD: 532 Ohm
AL THRESHOLD: 0.625 V
ATRIAL PACING ICD: 0.01 pct
BAMS-0001: 170 {beats}/min
BATTERY VOLTAGE: 3.1858 V
DEV-0020ICD: NEGATIVE
FVT: 0
RV LEAD THRESHOLD: 0.75 V
TOT-0006: 20120308000000
TZAT-0001ATACH: 1
TZAT-0002ATACH: NEGATIVE
TZAT-0002ATACH: NEGATIVE
TZAT-0002ATACH: NEGATIVE
TZAT-0002SLOWVT: NEGATIVE
TZAT-0012ATACH: 150 ms
TZAT-0012FASTVT: 170 ms
TZAT-0018ATACH: NEGATIVE
TZAT-0018ATACH: NEGATIVE
TZAT-0018SLOWVT: NEGATIVE
TZAT-0019ATACH: 6 V
TZAT-0019ATACH: 6 V
TZAT-0019FASTVT: 8 V
TZAT-0019SLOWVT: 8 V
TZAT-0020ATACH: 1.5 ms
TZAT-0020FASTVT: 1.5 ms
TZAT-0020SLOWVT: 1.5 ms
TZON-0003SLOWVT: 360 ms
TZST-0001ATACH: 5
TZST-0001FASTVT: 2
TZST-0001FASTVT: 3
TZST-0001FASTVT: 5
TZST-0001SLOWVT: 5
TZST-0001SLOWVT: 6
TZST-0002ATACH: NEGATIVE
TZST-0002ATACH: NEGATIVE
TZST-0002FASTVT: NEGATIVE
TZST-0002FASTVT: NEGATIVE
TZST-0002SLOWVT: NEGATIVE
TZST-0002SLOWVT: NEGATIVE
TZST-0002SLOWVT: NEGATIVE
VENTRICULAR PACING ICD: 0.03 pct
VF: 0

## 2012-01-31 NOTE — Progress Notes (Signed)
Remote defib check--optivol only

## 2012-02-13 ENCOUNTER — Encounter: Payer: Self-pay | Admitting: *Deleted

## 2012-03-05 ENCOUNTER — Other Ambulatory Visit (INDEPENDENT_AMBULATORY_CARE_PROVIDER_SITE_OTHER): Payer: BC Managed Care – PPO

## 2012-03-05 ENCOUNTER — Ambulatory Visit (INDEPENDENT_AMBULATORY_CARE_PROVIDER_SITE_OTHER): Payer: BC Managed Care – PPO | Admitting: Internal Medicine

## 2012-03-05 ENCOUNTER — Encounter: Payer: Self-pay | Admitting: Internal Medicine

## 2012-03-05 VITALS — BP 122/88 | HR 107 | Temp 98.7°F | Ht 64.0 in | Wt 219.0 lb

## 2012-03-05 DIAGNOSIS — D473 Essential (hemorrhagic) thrombocythemia: Secondary | ICD-10-CM

## 2012-03-05 DIAGNOSIS — D869 Sarcoidosis, unspecified: Secondary | ICD-10-CM

## 2012-03-05 DIAGNOSIS — E669 Obesity, unspecified: Secondary | ICD-10-CM

## 2012-03-05 DIAGNOSIS — R7303 Prediabetes: Secondary | ICD-10-CM

## 2012-03-05 DIAGNOSIS — E559 Vitamin D deficiency, unspecified: Secondary | ICD-10-CM

## 2012-03-05 DIAGNOSIS — I472 Ventricular tachycardia: Secondary | ICD-10-CM

## 2012-03-05 DIAGNOSIS — R7309 Other abnormal glucose: Secondary | ICD-10-CM

## 2012-03-05 DIAGNOSIS — R05 Cough: Secondary | ICD-10-CM

## 2012-03-05 LAB — CBC WITH DIFFERENTIAL/PLATELET
Basophils Absolute: 0 10*3/uL (ref 0.0–0.1)
Eosinophils Absolute: 0.2 10*3/uL (ref 0.0–0.7)
Eosinophils Relative: 2.3 % (ref 0.0–5.0)
MCV: 87.3 fl (ref 78.0–100.0)
Monocytes Absolute: 0.5 10*3/uL (ref 0.1–1.0)
Neutrophils Relative %: 74.5 % (ref 43.0–77.0)
Platelets: 517 10*3/uL — ABNORMAL HIGH (ref 150.0–400.0)
RDW: 14 % (ref 11.5–14.6)
WBC: 8.1 10*3/uL (ref 4.5–10.5)

## 2012-03-05 LAB — HEMOGLOBIN A1C: Hgb A1c MFr Bld: 6.2 % (ref 4.6–6.5)

## 2012-03-05 NOTE — Patient Instructions (Addendum)
#  URI  - glad you are getting over recent cold #Sarcoid Glad you are feeling well Have breathing test today Continue medrol dose  4mg  per day Cut down and Continue  Methotrexate at 7.5mg  per week (this should be 3 tablets per week) Continue but cut down bactrim from 3 times to 2 times per week #high Platelet count  - do not know why you have this   - Have blood test today for cbcd,  - will call you with results #VIt D Def - continue vit d replacement - recheck level today #Weight and blood sugars  - check HgBA1C test today #others   - nurse will ensure your medications doses are adjusted on our med list #followup  - 5 months - spirometry at followup

## 2012-03-05 NOTE — Progress Notes (Signed)
Subjective:    Patient ID: Alexandra Hood, female    DOB: 03/11/1971, 41 y.o.   MRN: [id number]  HPI 1. Stage 2 Pulmonary sarcoid   -pfts worsen when pred tapered  - s/p 2nd opinion 2010 with Dr Leta Speller at [facility] and 2012 at [facility] on steroids (medrol at her request since Jan 201) and methotrexate and bactrim   2. 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 Lupus Pernio (nasal bridge) 5. VIt D Deficiency. 6. Obesity and Pseudotumor cerebrii Body mass index is 37.59 kg/(m^2). on 03/05/2012 7. Pre-diabetes  HgbA1c 6.5 on 09/06/10    OV 07/18/2011: Followup for above. Last seen May Jan 2012. At that time, stilli  Tachycardic. Dr Graciela Husbands increased lopressor in June 2012 and now normocardic. Now continuing medrol 8mg  per day instead of prednisone but headaches of pseudoetumor cerebrii persists on and off 2 tiimes per week at mild levels. Appointments with Dr. Dione Booze and [place] at [facility] still pending. Lupus pernio is resolved.  She insists she is  compliant with medrol 8mg  daily and 10mg  methtotrexate once a week and bactrim prophylaxis. Denies dyspnea or cough but admits to weight gain. No other issues. Last cXr was in marc 9. 2012 and showed stable pulmnary sarcoid. Last LFTs Jan 2012 and chemistries March 2012 - normal.   REC Continue your current medications. If you want your medrol dose cut down, call us  Please see Dr Hyacinth Meeker for headaches  Return in 4 months - at that time do spirometry, lft and vitamin d levels   OV 12/06/2011 Follow up for above. Last seen July 2012. Is a 6 month followup. Feels great. Looking forward to holidays.. No active complaints. Tolerating methotrexate, Medrol and Bactrim for sarcoidosis quite well. Her ICD has not fired and she's not having tachycardia from a cardiac sarcoid. She is due to see Dr. Graciela Husbands soon. The keloid from  her ICD is persistent for is not bothering her. Her lupus pernio  in her nose is still in remission.  Her headaches from pseudotumor cerebri continue on off but they are better. She feels that Medrol works better than prednisone in terms of her mood and weight. She has not had blood tests since spring 2012. She says she is compliant with her medications including vitamin D for vitamin D deficiency.  Spirometry today shows Fev1 2.02L/79%. Ratio - normal.  This is essentially baseline for her  REC #Sarcoid Your lung function is 2L/79% and nearly normal Glad you are feeling well Please cut down medrol dose to 4mg  per day Continue  Methotrexate at 10mg  per week; depending on your test results we might cut this down further Continue bactrim as before Have blood test today for cbcd, lft, bmet - will call you with results #VIt D Def - continue vit d replacement - recheck level today #followup  - 5 months - spirometry at followup   OV 03/05/2012 followup Sarcoid, Vit D Def, obesity  Getting over cold - nephews and parents sick. Got sick end of 4-5 days ago. Getting better. HAs related cough due to URI but is resolving. From sarcoid: asymptomatic. Weight continues to be an issue. She does not want to weigh herself duue to embarassment. Currently on medrol 4mg  daily (reduced dose in dec 2012) and methotreaxate 10mg  per week and bactrim 3 per week. Spirometry today continues to hold well despiute cutting down prednisone dose and in fact is  best ever: Fev1 2.11/82%, FVC 2.47L/81%, Ratip 85 (101%). Of note, reviewing her labs over the past year I find that she has thrombocytosis > 500 count for over a year. Also, she has not had her HgbA1c checked since 2011 and Vit D checked in a while.   Past, Family, Social reviewed: no change since last visit  Review of Systems  Constitutional: Negative for fever and unexpected weight change.  HENT: Positive for rhinorrhea and sneezing. Negative for ear pain, nosebleeds, congestion, sore throat, trouble swallowing, dental  problem, postnasal drip and sinus pressure.   Eyes: Negative for redness and itching.  Respiratory: Positive for cough. Negative for chest tightness, shortness of breath and wheezing.   Cardiovascular: Negative for palpitations and leg swelling.  Gastrointestinal: Negative for nausea and vomiting.  Genitourinary: Negative for dysuria.  Musculoskeletal: Negative for joint swelling.  Skin: Negative for rash.  Neurological: Negative for headaches.  Hematological: Does not bruise/bleed easily.  Psychiatric/Behavioral: Negative for dysphoric mood. The patient is not nervous/anxious.        Objective:   Physical Exam Vitals reviewed. Constitutional: She is oriented to person, place, and time. She appears well-developed and well-nourished. No distress.       Obese Sunglass on as before  HENT:  Head: Normocephalic and atraumatic.  Right Ear: External ear normal.  Left Ear: External ear normal.  Mouth/Throat: Oropharynx is clear and moist. No oropharyngeal exudate.  Eyes: Conjunctivae and EOM are normal. Pupils are equal, round, and reactive to light. Right eye exhibits no discharge. Left eye exhibits no discharge. No scleral icterus.  Neck: Normal range of motion. Neck supple. No JVD present. No tracheal deviation present. No thyromegaly present.  Cardiovascular: Normal rate, regular rhythm, normal heart sounds and intact distal pulses.  Exam reveals no gallop and no friction rub.   No murmur heard. Pulmonary/Chest: Effort normal and breath sounds normal. No respiratory distress. She has no wheezes. She has no rales. She exhibits no tenderness.  Abdominal: Soft. Bowel sounds are normal. She exhibits no distension and no mass. There is no tenderness. There is no rebound and no guarding.  Musculoskeletal: Normal range of motion. She exhibits no edema and no tenderness.  Lymphadenopathy:    She has no cervical adenopathy.  Neurological: She is alert and oriented to person, place, and time. She  has normal reflexes. No cranial nerve deficit. She exhibits normal muscle tone. Coordination normal.  Skin: Skin is warm and dry. No rash noted. She is not diaphoretic. No erythema. No pallor.       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 Her behavior is normal. Judgment and thought content normal.          Assessment & Plan:

## 2012-03-06 ENCOUNTER — Encounter: Payer: Self-pay | Admitting: Internal Medicine

## 2012-03-06 DIAGNOSIS — R7303 Prediabetes: Secondary | ICD-10-CM | POA: Insufficient documentation

## 2012-03-06 DIAGNOSIS — E669 Obesity, unspecified: Secondary | ICD-10-CM | POA: Insufficient documentation

## 2012-03-06 DIAGNOSIS — D473 Essential (hemorrhagic) thrombocythemia: Secondary | ICD-10-CM | POA: Insufficient documentation

## 2012-03-06 LAB — VITAMIN D 25 HYDROXY (VIT D DEFICIENCY, FRACTURES): Vit D, 25-Hydroxy: <10 ng/mL — ABNORMAL LOW (ref 30–89)

## 2012-03-06 NOTE — Assessment & Plan Note (Signed)
#  LUPUS PERNIO/SARCOIDOSIS -skin bx confirmed at [facility] 05/07/2009 (personally reviewed)  -looks improved 07/09/2009  - almos gone from nose on May 2012 and July 2012 visit - Resolved December 2012 visit - Continued remission March 2013  Plan Continue to monitor

## 2012-03-06 NOTE — Assessment & Plan Note (Signed)
Counseled on diet

## 2012-03-06 NOTE — Assessment & Plan Note (Signed)
S/p ICD placement. March 2012. Per Dr Graciela Husbands No AICD firing March 2013   Plan monitoir

## 2012-03-06 NOTE — Assessment & Plan Note (Signed)
Asymptomatic. Fev1 is best ever.  Alexandra Hood 03/05/12: fev1 2.11/82%   PLAN -> cont medrol 4mg , cut mtx to 7.5mg  per week (from 10mg  per week)  and bactrim to twice a week (from 3 times per week)

## 2012-03-06 NOTE — Assessment & Plan Note (Signed)
Check hgab1c ; last check was 1 year ago

## 2012-03-06 NOTE — Assessment & Plan Note (Signed)
#  VIT D def - Aug 2011: 22 and low -> commence Rx.  - Jan 2012: 11   Plan Recheck level 03/05/12

## 2012-03-06 NOTE — Assessment & Plan Note (Signed)
Recheck plateletes; they have been high > 1 year

## 2012-03-08 ENCOUNTER — Telehealth: Payer: Self-pay | Admitting: Internal Medicine

## 2012-03-08 DIAGNOSIS — D473 Essential (hemorrhagic) thrombocythemia: Secondary | ICD-10-CM

## 2012-03-08 NOTE — Telephone Encounter (Signed)
I gave her lab results. Please do the following for her in bold  Lab results  Normal renal function. Normal Liver function  Hgab1c 6.2 - prediabetes but improved from 6.5 in 2011  - give [facility] diet sheet  - advised aeroobicc  Vit D < 10   - she should try REPLESTA  (not the childrens or NX)  Vit D3 50K once a week for 3 months and then she can use from [employer] 2000 units D3 dailly, She can buy this from    - http://lee-howell.com/  - http://www.everidishomedelivery.com/Category/Replesta.aspx  Platelet 500s (continues to be > 500 for 1 year)  - refer Heme Dr Cyndie Chime (order done)

## 2012-03-12 ENCOUNTER — Telehealth: Payer: Self-pay | Admitting: Oncology

## 2012-03-12 NOTE — Telephone Encounter (Signed)
S/w pt today re appt for 4/3 @ 11 am w/JG.

## 2012-03-13 ENCOUNTER — Telehealth: Payer: Self-pay | Admitting: Oncology

## 2012-03-13 NOTE — Telephone Encounter (Signed)
Referred by Dr. Marchelle Gearing Dx Thrombocytosis H/O Sarcoid

## 2012-03-13 NOTE — Telephone Encounter (Signed)
Pt advised and information mailed to the patient. Carron Curie, CMA

## 2012-03-14 ENCOUNTER — Telehealth: Payer: Self-pay | Admitting: Oncology

## 2012-03-14 NOTE — Telephone Encounter (Signed)
4/3 heme appt moved to 2:15 pm per JG due to he has to attend a Care Council meeting. S/w pt she is aware and was given new time for 4/3 @ 1:30 pm.

## 2012-03-15 ENCOUNTER — Telehealth: Payer: Self-pay | Admitting: Internal Medicine

## 2012-03-15 NOTE — Telephone Encounter (Signed)
Received 1 page. Sent to Dr. Marchelle Gearing. SD 03/15/12.

## 2012-03-18 ENCOUNTER — Other Ambulatory Visit: Payer: Self-pay | Admitting: Oncology

## 2012-03-18 ENCOUNTER — Telehealth: Payer: Self-pay | Admitting: Oncology

## 2012-03-18 DIAGNOSIS — D473 Essential (hemorrhagic) thrombocythemia: Secondary | ICD-10-CM

## 2012-03-18 DIAGNOSIS — D869 Sarcoidosis, unspecified: Secondary | ICD-10-CM

## 2012-03-18 NOTE — Telephone Encounter (Signed)
lmonvm of the pt advising her of a lab appt on 03/20/2012@3 :00pm

## 2012-03-20 ENCOUNTER — Other Ambulatory Visit: Payer: BC Managed Care – PPO

## 2012-03-27 ENCOUNTER — Ambulatory Visit: Payer: BC Managed Care – PPO

## 2012-03-27 ENCOUNTER — Ambulatory Visit: Payer: BC Managed Care – PPO | Admitting: Oncology

## 2012-03-27 ENCOUNTER — Telehealth: Payer: Self-pay | Admitting: *Deleted

## 2012-03-27 ENCOUNTER — Other Ambulatory Visit (HOSPITAL_BASED_OUTPATIENT_CLINIC_OR_DEPARTMENT_OTHER): Payer: BC Managed Care – PPO

## 2012-03-27 ENCOUNTER — Ambulatory Visit (HOSPITAL_BASED_OUTPATIENT_CLINIC_OR_DEPARTMENT_OTHER): Payer: BC Managed Care – PPO | Admitting: Oncology

## 2012-03-27 ENCOUNTER — Other Ambulatory Visit: Payer: BC Managed Care – PPO | Admitting: Lab

## 2012-03-27 VITALS — BP 123/85 | HR 116 | Temp 99.4°F | Ht 64.0 in | Wt 210.9 lb

## 2012-03-27 DIAGNOSIS — D869 Sarcoidosis, unspecified: Secondary | ICD-10-CM

## 2012-03-27 DIAGNOSIS — R7989 Other specified abnormal findings of blood chemistry: Secondary | ICD-10-CM

## 2012-03-27 DIAGNOSIS — D473 Essential (hemorrhagic) thrombocythemia: Secondary | ICD-10-CM

## 2012-03-27 LAB — CBC & DIFF AND RETIC
Basophils Absolute: 0 10*3/uL (ref 0.0–0.1)
Eosinophils Absolute: 0.1 10*3/uL (ref 0.0–0.5)
HGB: 12.3 g/dL (ref 11.6–15.9)
Immature Retic Fract: 7.2 % (ref 1.60–10.00)
MCV: 86.1 fL (ref 79.5–101.0)
MONO#: 0.6 10*3/uL (ref 0.1–0.9)
NEUT#: 5.6 10*3/uL (ref 1.5–6.5)
RDW: 13.9 % (ref 11.2–14.5)
Retic Ct Abs: 80.52 10*3/uL (ref 33.70–90.70)
WBC: 7.6 10*3/uL (ref 3.9–10.3)
lymph#: 1.3 10*3/uL (ref 0.9–3.3)

## 2012-03-27 LAB — MORPHOLOGY: PLT EST: INCREASED

## 2012-03-27 NOTE — Telephone Encounter (Signed)
per orders from 03-27-2012 patient needs to come in as needed

## 2012-03-27 NOTE — Progress Notes (Signed)
New Patient Hematology-Oncology Evaluation   Alexandra Hood [id number] [birth year] 41 y.o. 03/27/2012  CC: Dr. Kalman Shan; Dr. 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 Symptoms have been controlled with Topamax and Diamox. She then developed indolent onset of dyspnea, cough, atypical chest pain, and a lesion on the bridge of her nose. Biopsy of this lesion recorded as being "Lupus pernio". Due to progressive symptoms and an abnormal chest radiograph showing showing a spiculated left upper lobe lung nodule and masslike opacity with consolidation in the right upper lobe and a CT scan showing hilar and mediastinal adenopathy and bilateral pulmonary nodules largest 2.4 x 1.5 cm in the left upper lobe, she underwent bronchoscopy with bronchial alveolar lavage, biopsy of a right upper lobe lesion, and transbronchial needle biopsies of mediastinal lymph nodes at levels 4R and 10 are on 05/27/2009. Samples uniformly showed increased lymphocytes, histiocytes, multi-nucleated giant cells, and chronic inflammation all felt to be consistent with a diagnosis of sarcoidosis. She was started on steroids and methotrexate. She has had a significant response to this treatment. She is still on methotrexate 10 mg by mouth weekly and steroids have been tapered down to current dose of Medrol 4 mg daily. She has developed other problems in association with the sarcoidosis including malignant ventricular arrhythmias (ventricular tachycardia) requiring placement of a implanted defibrillator by Dr. Graciela Husbands. She has had a mild chronic anemia with hemoglobin  in the 10-12 g range  and chronic thrombocytosis ever since diagnosis of her sarcoidosis. Back in 2009 platelet counts ranged between 330 -400,000. At time of diagnosis of sarcoid in June 2010 platelet count was 630,000. Counts have fluctuated as high as 826,000 recorded in June 2011. Over the last year count has been in the 4-500,000 range with hemoglobins at 12-12.5 grams,  white count in the range of 7500-13,000 which likely reflects the times that she is on higher doses of steroids. She did have significant constitutional symptoms with weight loss up to 30 pounds, and night sweats, at time of her initial diagnosis which have resolved on treatment. She has never had any thrombotic events. There is no family history of collagen vascular disorder or blood disorder.       PMH: Past Medical History  Diagnosis Date  . Depression   . Pertussis   . Pseudotumor cerebri     .Marland KitchenMarland KitchenDr Hyacinth Meeker and Dr. Dione Booze - dxed Jan 2009, on diamox  - LP 01/24/2008 at [facility] - Normal ( pressure 14cm, gluc 68, Protein 23, Clear, colorless, RBC 3, WBC - too few to count)  - CT head non contrast done at [facility] 11/25/2007: "normal". Ddoes not report MRI, MRA, MRV being done - 2nd opinon by Dr. Pearlean Brownie - reportdly had MRI/MRV both normal; followup pending  . Lupus pernio     -skin bx confirmed at [facility] 05/07/2009 (personally reviewed) -looks improved 07/09/2009 -  . Pulmonary sarcoidosis     - Cleda Daub 07/25/2010 on pred 10/mtx 10 -> FEv1 1.84L/775%, FVC 2.25L - SPiro 09/06/2010 -> Fev1 2.14L/81%. Cut pred to 5mg  per day. Cont mtx - SPiro 01/20/2011 - Fev1 1.6L/61%, Ratio 74%. CXR Worse -> increased pred to 10mg  perday. Cont Mtx.    Marland Kitchen  Vitamin d deficiency     - Aug 2011: 22 and low -> commence Rx. - Jan 2012: 11  . Ventricular tachycardia   No history of hypertension, MI, ulcers, hepatitis, yellow jaundice, kidney stones, thyroid disease, seizure, stroke, blood clots.  Past Surgical History  Procedure Date  . 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 Cardiac defibrillator placement     Medtronic  She had sequential removal of both ovaries left side in 2003 right side with a salpingectomy in 2005 with findings of a teratoma.  Allergies: No Known Allergies  Medications: Medications Prior to Admission  Medication Sig Dispense Refill  . acetaZOLAMIDE (DIAMOX) 250 MG tablet Take 250 mg by mouth 2 (two) times daily.       . calcium gluconate 500 MG tablet Take 500 mg by mouth daily.        . Cholecalciferol (VITAMIN D3) 1000 UNITS CAPS Take 1 capsule by mouth daily.        . ferrous sulfate 325 (65 FE) MG tablet Take 325 mg by mouth daily with breakfast.        . folic acid (FOLVITE) 800 MCG tablet Take 800 mcg by mouth 2 (two) times daily.        Marland Kitchen ibuprofen (ADVIL,MOTRIN) 200 MG tablet Take 200 mg by mouth every 6 (six) hours as needed.        . methotrexate (RHEUMATREX) 2.5 MG tablet Take 7.5mg  per week.      . methylPREDNISolone (MEDROL) 8 MG tablet Take 0.5 tablets (4 mg total) by mouth daily. Stop prednisone  30 tablet  5  . metoprolol succinate (TOPROL-XL) 25 MG 24 hr tablet Take 3 tablets every morning and 1 tablet every evening  120 tablet  11  . sulfamethoxazole-trimethoprim (BACTRIM DS) 800-160 MG per tablet       . topiramate (TOPAMAX) 25 MG tablet Take 25 mg by mouth 2 (two) times daily.        Marland Kitchen DISCONTD: methylPREDNISolone (MEDROL) 8 MG tablet Take 1 tablet (8 mg total) by mouth daily. Stop prednisone  30 tablet  3   No current facility-administered medications on file as of 03/27/2012.    Social History: She is a high Geneticist, molecular. Currently no alcohol or tobacco. She is separated from her husband. She has one daughter who is a sophomore at [employer] who is in good health  she quit smoking about 5 years ago. . She had a 15 pack-year smoking history. She used to drink social alcohol but stopped.  Family History: Parents both alive and well except for hypertension. A sister 59 with hypertension. A  brother who was murdered. Family History  Problem Relation Age of Onset  . Allergies Mother     Review of Systems: Constitutional symptoms: Easy fatigue but no longer any night sweats weight loss or fevers. HEENT: No sore throat. Respiratory: Currently no dyspnea no cough  Cardiovascular:  Her defibrillator has never fired Gastrointestinal ROS: Normal bowel habit no hematochezia or melena Genito-Urinary ROS: . 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 Neurologic: She negative headaches only 3-4 times a month compared with daily back in 2008 when she was diagnosed with pseudotumor cerebri Dermatologic: No skin rash. Initial rash on the bridge of the nose has resolved Remaining ROS negative.  Physical Exam: Blood  pressure 123/85, pulse 116, temperature 99.4 F (37.4 C), temperature source Oral, height 5\' 4"  (1.626 m), weight 210 lb 14.4 oz (95.664 kg). Wt Readings from Last 3 Encounters:  03/27/12 210 lb 14.4 oz (95.664 kg)  03/05/12 219 lb (99.338 kg)  12/06/11 223 lb 6.4 oz (101.334 kg)    General appearance: Overweight African American woman Head: Normocephalic Neck: Full range of motion; no thyromegaly no thyroid nodules Oral pharynx: Tonsils of atrophy. No mass. No exudate Lymph nodes: No cervical supraclavicular or axillary adenopathy Breasts: Not examined Lungs: Clear to auscultation resonant to percussion Heart: Regular cardiac rhythm no murmur. Battery pack of her defibrillator subcutaneous tissues left subclavian area Abdominal: Soft nontender no mass no organomegaly GU: Not examined Extremities: No edema no calf tenderness Vascular: no cyanosis Neurologic: Mental status intact, cranial nerves intact, pupils equal round reactive to light, optic disc sharp, vessels normal no hemorrhage or exudate, motor  strength 5 over 5, reflexes 2+ symmetric, coordination normal. Sensation intact to vibration by tuning fork exam over the fingertips Skin: No rash or ecchymosis    Lab Results: Lab Results  Component Value Date   WBC 7.6 03/27/2012   HGB 12.3 03/27/2012   HCT 37.9 03/27/2012   MCV 86.1 03/27/2012   PLT 467* 03/27/2012     Chemistry      Component Value Date/Time   NA 140 12/06/2011 1708   K 4.4 12/06/2011 1708   CL 110 12/06/2011 1708   CO2 23 12/06/2011 1708   BUN 11 12/06/2011 1708   CREATININE 0.9 12/06/2011 1708      Component Value Date/Time   CALCIUM 9.6 12/06/2011 1708   ALKPHOS 95 12/06/2011 1708   AST 19 12/06/2011 1708   ALT 26 12/06/2011 1708   BILITOT 0.1* 12/06/2011 1708       Review of peripheral blood film: Normochromic normocytic red cells, mature neutrophils and lymphocytes, platelets slightly increased in number with some areas of platelet clumping normal morphology.   Radiological Studies: See discussion above     Impression and Plan: Reactive thrombocytosis secondary to underlying chronic inflammation associated with active sarcoidosis.  Although her risk for thrombosis may be higher than the average person due to her chronic inflammatory state, it should not be increased just because her platelet count is nonspecifically elevated. There is no evidence that she has a myeloproliferative disorder which would be distinctly uncommon in somebody of her young age. Platelet counts have fluctuated over time but there is no trend for progressive elevation.  Recommendation: I did obtain a blood sample for the JAK-2 mutation. This kinase gene is mutated in about 50% of people with essential thrombocythemia. I anticipate that it will not be mutated in this lady since I doubt she has a myeloproliferative disorder. I told her I would consider the optional use of aspirin 81 mg daily. Even if she had a myeloproliferative disorder, at her age and with no prior history of  thrombosis, aspirin alone would be the treatment of choice. I did not schedule a formal followup visit. I anticipate she will have chronic thrombocytosis.       Levert Feinstein, MD 03/27/2012, 4:00 PM

## 2012-03-27 NOTE — Telephone Encounter (Signed)
Per 4/3 pof return PRN.

## 2012-03-28 LAB — SEDIMENTATION RATE: Sed Rate: 66 mm/hr — ABNORMAL HIGH (ref 0–22)

## 2012-03-28 LAB — IRON AND TIBC
%SAT: 10 % — ABNORMAL LOW (ref 20–55)
TIBC: 307 ug/dL (ref 250–470)

## 2012-03-28 LAB — FERRITIN: Ferritin: 62 ng/mL (ref 10–291)

## 2012-04-01 ENCOUNTER — Telehealth: Payer: Self-pay

## 2012-04-01 NOTE — Telephone Encounter (Signed)
Message copied by Albertha Ghee on Mon Apr 01, 2012  1:25 PM ------      Message from: Levert Feinstein      Created: Mon Apr 01, 2012  1:17 PM       Call patient: genetic test normal as expected

## 2012-04-01 NOTE — Telephone Encounter (Signed)
Lab results given per Dr Patsy Lager note. dph

## 2012-04-11 ENCOUNTER — Ambulatory Visit (INDEPENDENT_AMBULATORY_CARE_PROVIDER_SITE_OTHER): Payer: BC Managed Care – PPO | Admitting: *Deleted

## 2012-04-11 ENCOUNTER — Encounter: Payer: Self-pay | Admitting: Internal Medicine

## 2012-04-11 DIAGNOSIS — I472 Ventricular tachycardia: Secondary | ICD-10-CM

## 2012-04-12 LAB — REMOTE ICD DEVICE
AL AMPLITUDE: 3.4 mv
AL IMPEDENCE ICD: 513 Ohm
ATRIAL PACING ICD: 0.01 pct
RV LEAD IMPEDENCE ICD: 380 Ohm
RV LEAD THRESHOLD: 0.75 V
TOT-0006: 20120308000000
TZAT-0001ATACH: 1
TZAT-0001ATACH: 2
TZAT-0001ATACH: 3
TZAT-0001FASTVT: 1
TZAT-0002ATACH: NEGATIVE
TZAT-0002ATACH: NEGATIVE
TZAT-0002SLOWVT: NEGATIVE
TZAT-0012ATACH: 150 ms
TZAT-0012ATACH: 150 ms
TZAT-0012ATACH: 150 ms
TZAT-0012FASTVT: 170 ms
TZAT-0018ATACH: NEGATIVE
TZAT-0018ATACH: NEGATIVE
TZAT-0020ATACH: 1.5 ms
TZAT-0020ATACH: 1.5 ms
TZAT-0020FASTVT: 1.5 ms
TZST-0001ATACH: 4
TZST-0001ATACH: 6
TZST-0001FASTVT: 4
TZST-0001FASTVT: 5
TZST-0001SLOWVT: 3
TZST-0001SLOWVT: 5
TZST-0002ATACH: NEGATIVE
TZST-0002ATACH: NEGATIVE
TZST-0002ATACH: NEGATIVE
TZST-0002FASTVT: NEGATIVE
TZST-0002FASTVT: NEGATIVE
TZST-0002SLOWVT: NEGATIVE
TZST-0002SLOWVT: NEGATIVE
VF: 0

## 2012-04-19 ENCOUNTER — Encounter: Payer: Self-pay | Admitting: *Deleted

## 2012-04-22 NOTE — Progress Notes (Signed)
Remote icd check w/icm  

## 2012-05-09 ENCOUNTER — Encounter: Payer: BC Managed Care – PPO | Admitting: *Deleted

## 2012-05-10 ENCOUNTER — Ambulatory Visit (INDEPENDENT_AMBULATORY_CARE_PROVIDER_SITE_OTHER): Payer: BC Managed Care – PPO | Admitting: *Deleted

## 2012-05-10 DIAGNOSIS — Z95810 Presence of automatic (implantable) cardiac defibrillator: Secondary | ICD-10-CM

## 2012-05-10 DIAGNOSIS — I509 Heart failure, unspecified: Secondary | ICD-10-CM

## 2012-05-10 DIAGNOSIS — I4729 Other ventricular tachycardia: Secondary | ICD-10-CM

## 2012-05-10 DIAGNOSIS — I472 Ventricular tachycardia: Secondary | ICD-10-CM

## 2012-05-11 ENCOUNTER — Encounter: Payer: Self-pay | Admitting: Internal Medicine

## 2012-05-13 LAB — REMOTE ICD DEVICE
AL AMPLITUDE: 3 mv
AL IMPEDENCE ICD: 513 Ohm
ATRIAL PACING ICD: 0 pct
RV LEAD IMPEDENCE ICD: 380 Ohm
TOT-0001: 1
TOT-0002: 1
TOT-0006: 20120308000000
TZAT-0001ATACH: 2
TZAT-0001FASTVT: 1
TZAT-0002ATACH: NEGATIVE
TZAT-0002FASTVT: NEGATIVE
TZAT-0012ATACH: 150 ms
TZAT-0012FASTVT: 170 ms
TZAT-0019ATACH: 6 V
TZAT-0019ATACH: 6 V
TZAT-0020ATACH: 1.5 ms
TZAT-0020ATACH: 1.5 ms
TZAT-0020SLOWVT: 1.5 ms
TZON-0003SLOWVT: 360 ms
TZON-0003VSLOWVT: 430 ms
TZST-0001FASTVT: 2
TZST-0001FASTVT: 4
TZST-0001FASTVT: 5
TZST-0001SLOWVT: 2
TZST-0001SLOWVT: 3
TZST-0001SLOWVT: 5
TZST-0002FASTVT: NEGATIVE
TZST-0002FASTVT: NEGATIVE
TZST-0002FASTVT: NEGATIVE
TZST-0002SLOWVT: NEGATIVE

## 2012-05-27 ENCOUNTER — Encounter: Payer: Self-pay | Admitting: *Deleted

## 2012-07-18 ENCOUNTER — Ambulatory Visit (INDEPENDENT_AMBULATORY_CARE_PROVIDER_SITE_OTHER): Payer: BC Managed Care – PPO | Admitting: *Deleted

## 2012-07-18 ENCOUNTER — Encounter: Payer: Self-pay | Admitting: Internal Medicine

## 2012-07-18 DIAGNOSIS — I472 Ventricular tachycardia, unspecified: Secondary | ICD-10-CM

## 2012-07-18 DIAGNOSIS — I4729 Other ventricular tachycardia: Secondary | ICD-10-CM

## 2012-07-22 LAB — REMOTE ICD DEVICE
AL IMPEDENCE ICD: 494 Ohm
AL THRESHOLD: 0.375 V
BATTERY VOLTAGE: 3.1653 V
DEV-0020ICD: NEGATIVE
PACEART VT: 0
RV LEAD AMPLITUDE: 3.375 mv
RV LEAD THRESHOLD: 0.75 V
TOT-0006: 20120308000000
TZAT-0001ATACH: 1
TZAT-0001ATACH: 2
TZAT-0001ATACH: 3
TZAT-0001SLOWVT: 1
TZAT-0002ATACH: NEGATIVE
TZAT-0002ATACH: NEGATIVE
TZAT-0002ATACH: NEGATIVE
TZAT-0002SLOWVT: NEGATIVE
TZAT-0012ATACH: 150 ms
TZAT-0012ATACH: 150 ms
TZAT-0012SLOWVT: 170 ms
TZAT-0018ATACH: NEGATIVE
TZAT-0018ATACH: NEGATIVE
TZAT-0018ATACH: NEGATIVE
TZAT-0018FASTVT: NEGATIVE
TZAT-0018SLOWVT: NEGATIVE
TZAT-0019ATACH: 6 V
TZAT-0019ATACH: 6 V
TZAT-0019FASTVT: 8 V
TZAT-0019SLOWVT: 8 V
TZAT-0020FASTVT: 1.5 ms
TZON-0003ATACH: 350 ms
TZST-0001ATACH: 4
TZST-0001ATACH: 5
TZST-0001FASTVT: 3
TZST-0001FASTVT: 5
TZST-0001FASTVT: 6
TZST-0001SLOWVT: 4
TZST-0001SLOWVT: 5
TZST-0001SLOWVT: 6
TZST-0002ATACH: NEGATIVE
TZST-0002ATACH: NEGATIVE
TZST-0002FASTVT: NEGATIVE
TZST-0002FASTVT: NEGATIVE
TZST-0002SLOWVT: NEGATIVE
TZST-0002SLOWVT: NEGATIVE
VENTRICULAR PACING ICD: 0.04 pct
VF: 0

## 2012-08-22 ENCOUNTER — Encounter: Payer: Self-pay | Admitting: *Deleted

## 2012-10-07 ENCOUNTER — Ambulatory Visit (INDEPENDENT_AMBULATORY_CARE_PROVIDER_SITE_OTHER): Payer: BC Managed Care – PPO | Admitting: Family Medicine

## 2012-10-07 ENCOUNTER — Encounter: Payer: Self-pay | Admitting: Physician Assistant

## 2012-10-07 VITALS — BP 120/76 | HR 100 | Temp 98.4°F | Resp 16 | Ht 64.0 in | Wt 211.8 lb

## 2012-10-07 DIAGNOSIS — D869 Sarcoidosis, unspecified: Secondary | ICD-10-CM

## 2012-10-07 DIAGNOSIS — R350 Frequency of micturition: Secondary | ICD-10-CM

## 2012-10-07 DIAGNOSIS — R109 Unspecified abdominal pain: Secondary | ICD-10-CM

## 2012-10-07 LAB — POCT CBC
Granulocyte percent: 68.4 %G (ref 37–80)
HCT, POC: 38.6 % (ref 37.7–47.9)
Hemoglobin: 11.8 g/dL — AB (ref 12.2–16.2)
Lymph, poc: 2.2 (ref 0.6–3.4)
MCH, POC: 27.7 pg (ref 27–31.2)
MCHC: 30.6 g/dL — AB (ref 31.8–35.4)
MCV: 90.5 fL (ref 80–97)
MID (cbc): 0.6 (ref 0–0.9)
MPV: 8.6 fL (ref 0–99.8)
POC Granulocyte: 6.1 (ref 2–6.9)
POC LYMPH PERCENT: 24.3 %L (ref 10–50)
POC MID %: 7.3 %M (ref 0–12)
Platelet Count, POC: 525 10*3/uL — AB (ref 142–424)
RBC: 4.26 M/uL (ref 4.04–5.48)
RDW, POC: 15.9 %
WBC: 8.9 10*3/uL (ref 4.6–10.2)

## 2012-10-07 LAB — POCT UA - MICROSCOPIC ONLY
Casts, Ur, LPF, POC: NEGATIVE
Crystals, Ur, HPF, POC: NEGATIVE
Yeast, UA: NEGATIVE

## 2012-10-07 LAB — POCT URINALYSIS DIPSTICK
Bilirubin, UA: NEGATIVE
Glucose, UA: NEGATIVE
Ketones, UA: NEGATIVE
Leukocytes, UA: NEGATIVE
Nitrite, UA: NEGATIVE
Protein, UA: NEGATIVE
Spec Grav, UA: 1.025
Urobilinogen, UA: 0.2
pH, UA: 5.5

## 2012-10-07 MED ORDER — CIPROFLOXACIN HCL 250 MG PO TABS: 250.0000 mg | ORAL_TABLET | Freq: Two times a day (BID) | ORAL | Status: DC

## 2012-10-07 MED ORDER — HYDROCODONE-ACETAMINOPHEN 5-500 MG PO TABS: 1.0000 | ORAL_TABLET | Freq: Three times a day (TID) | ORAL | Status: DC | PRN

## 2012-10-07 NOTE — Progress Notes (Signed)
  Subjective:    Patient ID: Alexandra Hood, female    DOB: [birth year], 41 y.o.   MRN: [id number]  HPI 41 year old female presents with 3 day history of bilateral flank pain that has slowly worsened over the weekend.  Does admit to urinary frequency and slight nausea. Denies dysuria, hematuria, vomiting, fever, or chills. Does have some abdominal pain.  States pain in her flank radiates around to her bladder area.  Has had urinary tract infections in the past.  No history of kidney stones.  Does have sarcoidosis and pseudotumor cerebri.  Has recently been diagnosed with heart failure and has a implantable defibrillator.  Has been taking Advil 200 mg daily which has not helped the pain.     Review of Systems  Constitutional: Negative for fever and chills.  Respiratory: Negative for cough.   Genitourinary: Positive for frequency and flank pain. Negative for dysuria, hematuria and pelvic pain.  All other systems reviewed and are negative.       Objective:   Physical Exam  Constitutional: She is oriented to person, place, and time. She appears well-developed and well-nourished.  HENT:  Head: Normocephalic and atraumatic.  Right Ear: External ear normal.  Left Ear: External ear normal.  Eyes: Conjunctivae normal are normal.  Neck: Normal range of motion.  Cardiovascular: Normal rate, regular rhythm and normal heart sounds.   Pulmonary/Chest: Effort normal and breath sounds normal.  Abdominal: Soft. Bowel sounds are normal. There is no tenderness (slight CVA tenderness bilaterally). There is no rebound and no guarding.  Lymphadenopathy:    She has no cervical adenopathy.  Neurological: She is alert and oriented to person, place, and time.  Psychiatric: She has a normal mood and affect. Her behavior is normal. Judgment and thought content normal.          Assessment & Plan:   1. Urinary frequency  POCT UA - Microscopic Only, POCT urinalysis dipstick, Urine culture  2. Flank  pain  POCT CBC, Comprehensive metabolic panel  3. Sarcoidosis     Await labs. Culture sent Vicodin prn pain.  Will treat with Cipro 250 mg bid. Ok to d/c if culture negative Follow up if symptoms worsen or fail to improve.

## 2012-10-08 ENCOUNTER — Ambulatory Visit (INDEPENDENT_AMBULATORY_CARE_PROVIDER_SITE_OTHER): Payer: BC Managed Care – PPO | Admitting: Family Medicine

## 2012-10-08 VITALS — BP 136/76 | HR 121 | Temp 98.6°F | Resp 16 | Ht 65.0 in | Wt 210.6 lb

## 2012-10-08 DIAGNOSIS — B029 Zoster without complications: Secondary | ICD-10-CM

## 2012-10-08 LAB — COMPREHENSIVE METABOLIC PANEL
ALT: 22 U/L (ref 0–35)
AST: 17 U/L (ref 0–37)
Albumin: 4.4 g/dL (ref 3.5–5.2)
Alkaline Phosphatase: 107 U/L (ref 39–117)
BUN: 12 mg/dL (ref 6–23)
CO2: 30 mEq/L (ref 19–32)
Calcium: 10.3 mg/dL (ref 8.4–10.5)
Chloride: 104 mEq/L (ref 96–112)
Creat: 0.67 mg/dL (ref 0.50–1.10)
Glucose, Bld: 102 mg/dL — ABNORMAL HIGH (ref 70–99)
Potassium: 4.8 mEq/L (ref 3.5–5.3)
Sodium: 141 mEq/L (ref 135–145)
Total Bilirubin: 0.2 mg/dL — ABNORMAL LOW (ref 0.3–1.2)
Total Protein: 7.3 g/dL (ref 6.0–8.3)

## 2012-10-08 MED ORDER — VALACYCLOVIR HCL 1 G PO TABS: 1000.0000 mg | ORAL_TABLET | Freq: Three times a day (TID) | ORAL | Status: DC

## 2012-10-08 NOTE — Progress Notes (Signed)
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   Objective:  Vesicular patchy erythematous rash corresponding to left 4th thoracic dermatome  Assessment:  shingles

## 2012-10-08 NOTE — Patient Instructions (Signed)

## 2012-10-10 LAB — URINE CULTURE: Colony Count: 35000

## 2012-10-14 ENCOUNTER — Ambulatory Visit (INDEPENDENT_AMBULATORY_CARE_PROVIDER_SITE_OTHER): Payer: BC Managed Care – PPO | Admitting: Internal Medicine

## 2012-10-14 ENCOUNTER — Encounter: Payer: Self-pay | Admitting: Internal Medicine

## 2012-10-14 VITALS — BP 136/83 | HR 111 | Ht 64.0 in | Wt 211.4 lb

## 2012-10-14 DIAGNOSIS — Z95810 Presence of automatic (implantable) cardiac defibrillator: Secondary | ICD-10-CM

## 2012-10-14 DIAGNOSIS — I472 Ventricular tachycardia: Secondary | ICD-10-CM

## 2012-10-14 DIAGNOSIS — I429 Cardiomyopathy, unspecified: Secondary | ICD-10-CM

## 2012-10-14 LAB — ICD DEVICE OBSERVATION
ATRIAL PACING ICD: 0 pct
BAMS-0001: 170 {beats}/min
CHARGE TIME: 9.1 s
RV LEAD IMPEDENCE ICD: 399 Ohm
RV LEAD THRESHOLD: 0.75 V
TZAT-0001ATACH: 3
TZAT-0002ATACH: NEGATIVE
TZAT-0002ATACH: NEGATIVE
TZAT-0002ATACH: NEGATIVE
TZAT-0012ATACH: 150 ms
TZAT-0018ATACH: NEGATIVE
TZAT-0019ATACH: 6 V
TZAT-0019ATACH: 6 V
TZAT-0019ATACH: 6 V
TZAT-0019SLOWVT: 8 V
TZAT-0020ATACH: 1.5 ms
TZAT-0020FASTVT: 1.5 ms
TZAT-0020SLOWVT: 1.5 ms
TZON-0003SLOWVT: 360 ms
TZON-0004VSLOWVT: 32
TZON-0005SLOWVT: 12
TZST-0001ATACH: 5
TZST-0001ATACH: 6
TZST-0001FASTVT: 2
TZST-0001FASTVT: 3
TZST-0001SLOWVT: 3
TZST-0001SLOWVT: 5
TZST-0001SLOWVT: 6
TZST-0002ATACH: NEGATIVE
TZST-0002ATACH: NEGATIVE
TZST-0002ATACH: NEGATIVE
TZST-0002FASTVT: NEGATIVE
TZST-0002FASTVT: NEGATIVE
TZST-0002SLOWVT: NEGATIVE
TZST-0002SLOWVT: NEGATIVE

## 2012-10-14 NOTE — Assessment & Plan Note (Signed)
Stable

## 2012-10-14 NOTE — Assessment & Plan Note (Signed)
Recurrent nonsustained ventricular tachycardia-not treated

## 2012-10-14 NOTE — Assessment & Plan Note (Signed)
The patient's device was interrogated.  The information was reviewed. No changes were made in the programming.    

## 2012-10-14 NOTE — Progress Notes (Signed)
Patient Care Team: Jonita Albee, MD as PCP - General (Internal Medicine)   HPI  Alexandra Hood is a 41 y.o. 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  She is status post ICD implantation  She also has a history of persistent sinus tachycardia   htheadedness or syncope.  She is walking daily. 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  Past Medical History  Diagnosis Date  . Depression   . Pertussis   . Pseudotumor cerebri     .Marland KitchenMarland KitchenDr Hyacinth Meeker and Dr. Dione Booze - dxed Jan 2009, on diamox  - LP 01/24/2008 at [facility] - Normal ( pressure 14cm, gluc 68, Protein 23, Clear, colorless, RBC 3, WBC - too few to count)  - CT head non contrast done at [facility] 11/25/2007: "normal". Ddoes not report MRI, MRA, MRV being done - 2nd opinon by Dr. Pearlean Brownie - reportdly had MRI/MRV both normal; followup pending  . Lupus pernio     -skin bx confirmed at [facility] 05/07/2009 (personally reviewed) -looks improved 07/09/2009 -  . Pulmonary sarcoidosis     - Cleda Daub 07/25/2010 on pred 10/mtx 10 -> FEv1 1.84L/775%, FVC 2.25L - SPiro 09/06/2010 -> Fev1 2.14L/81%. Cut pred to 5mg  per day. Cont mtx - SPiro 01/20/2011 - Fev1 1.6L/61%, Ratio 74%. CXR Worse -> increased pred to 10mg  perday. Cont Mtx.    . Vitamin D deficiency     - Aug 2011: 22 and low -> commence Rx. - Jan 2012: 11  . Ventricular tachycardia     Past Surgical History  Procedure Date  . 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 Cardiac defibrillator placement     Medtronic    Current Outpatient Prescriptions  Medication Sig Dispense Refill  . acetaZOLAMIDE (DIAMOX) 250 MG tablet Take 250 mg by mouth 2 (two) times daily.       . calcium gluconate 500 MG tablet Take 500 mg by  mouth daily.        . Cholecalciferol (VITAMIN D3) 1000 UNITS CAPS Take 1 capsule by mouth daily.        . ciprofloxacin (CIPRO) 250 MG tablet Take 1 tablet (250 mg total) by mouth 2 (two) times daily.  10 tablet  0  . ferrous sulfate 325 (65 FE) MG tablet Take 325 mg by mouth daily with breakfast.        . folic acid (FOLVITE) 800 MCG tablet Take 800 mcg by mouth 2 (two) times daily.        Marland Kitchen HYDROcodone-acetaminophen (VICODIN) 5-500 MG per tablet Take 1 tablet by mouth every 8 (eight) hours as needed for pain.  20 tablet  0  . ibuprofen (ADVIL,MOTRIN) 200 MG tablet Take 200 mg by mouth every 6 (six) hours as needed.        . methotrexate (RHEUMATREX) 2.5 MG tablet Take 7.5mg  per week.      . methylPREDNISolone (MEDROL) 8 MG tablet Take 0.5 tablets (4 mg total) by mouth daily. Stop prednisone  30 tablet  5  . metoprolol succinate (TOPROL-XL) 25 MG 24 hr tablet Take 3 tablets every morning and 1 tablet every evening  120 tablet  11  . sulfamethoxazole-trimethoprim (BACTRIM DS) 800-160 MG per tablet       .  topiramate (TOPAMAX) 25 MG tablet Take 25 mg by mouth 2 (two) times daily.        . valACYclovir (VALTREX) 1000 MG tablet Take 1 tablet (1,000 mg total) by mouth 3 (three) times daily.  21 tablet  0    No Known Allergies  Review of Systems negative except from HPI and PMH  Physical Exam BP 136/83  Pulse 111  Ht 5\' 4"  (1.626 m)  Wt 211 lb 6.4 oz (95.89 kg)  BMI 36.29 kg/m2 Well developed and well nourished in no acute distress HENT normal E scleral and icterus clear Neck Supple JVP flat; carotids brisk and full Clear to ausculation Device pocket well healed; without hematoma or erythema  Regular rate and rhythm, no murmurs gallops or rub Soft with active bowel sounds No clubbing cyanosis none Edema Alert and oriented, grossly normal motor and sensory function Skin Warm and Dry  Electrocardiogram demonstrated sinus rhythm at 96 Intervals 15/09/36 axis XXXII Otherwise  normal  Assessment and  Plan

## 2013-01-07 ENCOUNTER — Other Ambulatory Visit: Payer: Self-pay | Admitting: Internal Medicine

## 2013-01-20 ENCOUNTER — Ambulatory Visit (INDEPENDENT_AMBULATORY_CARE_PROVIDER_SITE_OTHER): Payer: BC Managed Care – PPO | Admitting: *Deleted

## 2013-01-20 ENCOUNTER — Encounter: Payer: Self-pay | Admitting: Internal Medicine

## 2013-01-20 DIAGNOSIS — I429 Cardiomyopathy, unspecified: Secondary | ICD-10-CM

## 2013-01-20 DIAGNOSIS — Z95810 Presence of automatic (implantable) cardiac defibrillator: Secondary | ICD-10-CM

## 2013-01-20 LAB — REMOTE ICD DEVICE
AL AMPLITUDE: 2.5 mv
AL THRESHOLD: 0.375 V
ATRIAL PACING ICD: 0 pct
BAMS-0001: 170 {beats}/min
BATTERY VOLTAGE: 3.1449 V
FVT: 0
RV LEAD AMPLITUDE: 2.6 mv
RV LEAD IMPEDENCE ICD: 380 Ohm
RV LEAD THRESHOLD: 0.75 V
TOT-0006: 20120308000000
TZAT-0001ATACH: 1
TZAT-0002ATACH: NEGATIVE
TZAT-0002ATACH: NEGATIVE
TZAT-0002SLOWVT: NEGATIVE
TZAT-0012ATACH: 150 ms
TZAT-0012FASTVT: 170 ms
TZAT-0012SLOWVT: 170 ms
TZAT-0018ATACH: NEGATIVE
TZAT-0018FASTVT: NEGATIVE
TZAT-0019ATACH: 6 V
TZAT-0019ATACH: 6 V
TZAT-0019ATACH: 6 V
TZAT-0019SLOWVT: 8 V
TZAT-0020ATACH: 1.5 ms
TZAT-0020ATACH: 1.5 ms
TZAT-0020FASTVT: 1.5 ms
TZAT-0020SLOWVT: 1.5 ms
TZON-0003SLOWVT: 360 ms
TZST-0001ATACH: 4
TZST-0001ATACH: 5
TZST-0001FASTVT: 2
TZST-0001FASTVT: 5
TZST-0001FASTVT: 6
TZST-0001SLOWVT: 3
TZST-0001SLOWVT: 5
TZST-0002ATACH: NEGATIVE
TZST-0002ATACH: NEGATIVE
TZST-0002FASTVT: NEGATIVE
TZST-0002SLOWVT: NEGATIVE
TZST-0002SLOWVT: NEGATIVE
TZST-0002SLOWVT: NEGATIVE
VF: 0

## 2013-01-26 ENCOUNTER — Other Ambulatory Visit: Payer: Self-pay | Admitting: Internal Medicine

## 2013-01-28 ENCOUNTER — Encounter: Payer: Self-pay | Admitting: *Deleted

## 2013-02-11 ENCOUNTER — Other Ambulatory Visit: Payer: Self-pay | Admitting: *Deleted

## 2013-02-11 MED ORDER — SULFAMETHOXAZOLE-TMP DS 800-160 MG PO TABS: ORAL_TABLET | ORAL | Status: DC

## 2013-02-11 MED ORDER — METHOTREXATE 2.5 MG PO TABS: ORAL_TABLET | ORAL | Status: DC

## 2013-04-14 ENCOUNTER — Encounter: Payer: Self-pay | Admitting: *Deleted

## 2013-04-14 ENCOUNTER — Ambulatory Visit (INDEPENDENT_AMBULATORY_CARE_PROVIDER_SITE_OTHER): Payer: BC Managed Care – PPO | Admitting: Family Medicine

## 2013-04-14 ENCOUNTER — Encounter: Payer: Self-pay | Admitting: Family Medicine

## 2013-04-14 ENCOUNTER — Other Ambulatory Visit: Payer: Self-pay

## 2013-04-14 ENCOUNTER — Ambulatory Visit (INDEPENDENT_AMBULATORY_CARE_PROVIDER_SITE_OTHER): Payer: BC Managed Care – PPO | Admitting: *Deleted

## 2013-04-14 ENCOUNTER — Ambulatory Visit: Payer: BC Managed Care – PPO

## 2013-04-14 ENCOUNTER — Encounter: Payer: Self-pay | Admitting: Internal Medicine

## 2013-04-14 ENCOUNTER — Telehealth: Payer: Self-pay | Admitting: Family Medicine

## 2013-04-14 VITALS — BP 130/88 | HR 89 | Temp 98.6°F | Resp 16 | Ht 65.0 in | Wt 219.0 lb

## 2013-04-14 DIAGNOSIS — R079 Chest pain, unspecified: Secondary | ICD-10-CM

## 2013-04-14 DIAGNOSIS — I429 Cardiomyopathy, unspecified: Secondary | ICD-10-CM

## 2013-04-14 DIAGNOSIS — I472 Ventricular tachycardia: Secondary | ICD-10-CM

## 2013-04-14 DIAGNOSIS — M940 Chondrocostal junction syndrome [Tietze]: Secondary | ICD-10-CM

## 2013-04-14 LAB — ICD DEVICE OBSERVATION
AL AMPLITUDE: 3 mv
ATRIAL PACING ICD: 0 pct
BAMS-0001: 170 {beats}/min
BATTERY VOLTAGE: 3.12 V
DEV-0020ICD: NEGATIVE
RV LEAD AMPLITUDE: 2.4 mv
TZAT-0001ATACH: 2
TZAT-0001FASTVT: 1
TZAT-0001SLOWVT: 1
TZAT-0002ATACH: NEGATIVE
TZAT-0002FASTVT: NEGATIVE
TZAT-0012ATACH: 150 ms
TZAT-0012FASTVT: 170 ms
TZAT-0018ATACH: NEGATIVE
TZAT-0019ATACH: 6 V
TZAT-0019ATACH: 6 V
TZAT-0020ATACH: 1.5 ms
TZAT-0020ATACH: 1.5 ms
TZAT-0020SLOWVT: 1.5 ms
TZON-0003SLOWVT: 360 ms
TZON-0003VSLOWVT: 350 ms
TZST-0001FASTVT: 2
TZST-0001FASTVT: 4
TZST-0001SLOWVT: 2
TZST-0001SLOWVT: 3
TZST-0001SLOWVT: 4
TZST-0001SLOWVT: 5
TZST-0002ATACH: NEGATIVE
TZST-0002FASTVT: NEGATIVE
TZST-0002FASTVT: NEGATIVE
TZST-0002FASTVT: NEGATIVE
TZST-0002SLOWVT: NEGATIVE
VENTRICULAR PACING ICD: 0 pct

## 2013-04-14 LAB — POCT CBC
Granulocyte percent: 69.5 % (ref 37–80)
HCT, POC: 36.8 % — AB (ref 37.7–47.9)
Hemoglobin: 11.2 g/dL — AB (ref 12.2–16.2)
Lymph, poc: 2 (ref 0.6–3.4)
MCH, POC: 27 pg (ref 27–31.2)
MCHC: 30.4 g/dL — AB (ref 31.8–35.4)
MCV: 88.7 fL (ref 80–97)
MID (cbc): 0.6 (ref 0–0.9)
MPV: 8 fL (ref 0–99.8)
POC Granulocyte: 6 (ref 2–6.9)
POC LYMPH PERCENT: 23.4 %L (ref 10–50)
POC MID %: 7.1 %M (ref 0–12)
Platelet Count, POC: 572 10*3/uL — AB (ref 142–424)
RBC: 4.15 M/uL (ref 4.04–5.48)
RDW, POC: 14.7 %
WBC: 8.7 10*3/uL (ref 4.6–10.2)

## 2013-04-14 LAB — TROPONIN I: Troponin I: <0.01 ng/mL (ref <0.06)

## 2013-04-14 LAB — COMPREHENSIVE METABOLIC PANEL WITH GFR
ALT: 22 U/L (ref 0–35)
AST: 15 U/L (ref 0–37)
Albumin: 4.1 g/dL (ref 3.5–5.2)
Calcium: 9.9 mg/dL (ref 8.4–10.5)
Chloride: 105 meq/L (ref 96–112)
Potassium: 4.4 meq/L (ref 3.5–5.3)

## 2013-04-14 LAB — COMPREHENSIVE METABOLIC PANEL
Alkaline Phosphatase: 113 U/L (ref 39–117)
BUN: 11 mg/dL (ref 6–23)
CO2: 28 mEq/L (ref 19–32)
Creat: 0.68 mg/dL (ref 0.50–1.10)
Glucose, Bld: 99 mg/dL (ref 70–99)
Sodium: 141 mEq/L (ref 135–145)
Total Bilirubin: 0.3 mg/dL (ref 0.3–1.2)
Total Protein: 7 g/dL (ref 6.0–8.3)

## 2013-04-14 NOTE — Patient Instructions (Addendum)
Chest Pain (Nonspecific) Chest pain has many causes. Your pain could be caused by something serious, such as a heart attack or a blood clot in the lungs. It could also be caused by something less serious, such as a chest bruise or a virus. Follow up with your doctor. More lab tests or other studies may be needed to find the cause of your pain. Most of the time, nonspecific chest pain will improve within 2 to 3 days of rest and mild pain medicine. HOME CARE  For chest bruises, you may put ice on the sore area for 15 to 20 minutes, 3 to 4 times a day. Do this only if it makes you or your child feel better.  Put ice in a plastic bag.  Place a towel between the skin and the bag.  Rest for the next 2 to 3 days.  Go back to work if the pain improves.  See your doctor if the pain lasts longer than 1 to 2 weeks.  Only take medicine as told by your doctor.  Quit smoking if you smoke. GET HELP RIGHT AWAY IF:   There is more pain or pain that spreads to the arm, neck, jaw, back, or belly (abdomen).  You or your child has shortness of breath.  You or your child coughs more than usual or coughs up blood.  You or your child has very bad back or belly pain, feels sick to his or her stomach (nauseous), or throws up (vomits).  You or your child has very bad weakness.  You or your child passes out (faints).  You or your child has a temperature by mouth above 102 F (38.9 C), not controlled by medicine. Any of these problems may be serious and may be an emergency. Do not wait to see if the problems will go away. Get medical help right away. Call your local emergency services 911 in [place] Do not drive yourself to the hospital. MAKE SURE YOU:   Understand these instructions.  Will watch this condition.  Will get help right away if you or your child is not doing well or gets worse. Document Released: 05/29/2008 Document Revised: 03/04/2012 Document Reviewed: 05/29/2008 Exit[employer] Patient  Information 2013 [employer].  

## 2013-04-14 NOTE — Telephone Encounter (Signed)
Spoke with patient-troponin normal

## 2013-04-14 NOTE — Progress Notes (Signed)
Defib check in clinic for pain at site. Chest pain at site radiating to back since 04-11-13. Normal device function. 16 monitored VT epsiodes. Battery voltage 3.12V. Dr Graciela Husbands evaluated and all looked normal. [facility] 07-21-13 and ROV in October with SK.

## 2013-04-14 NOTE — Progress Notes (Signed)
[facility]:  Office Visit  Chief Complaint: No chief complaint on file.   HPI: Alexandra Hood is a 42 y.o. female who complains of  Left anterior wall chest pain at the site of her ICD x 3 days while sitting watching TV at parent's.  It was sharp and took her breath away at that time and lasted 2-3 hours radiating to her shoulder and shoulder blade,she also had associated diaphoresis, and numbness going down left arm. The CP resolved and then return on Sunday morning and has been present ever since, 2/10 aching pain at ICD , She has no other sxs ie new palpitations, HA, vision changes, N/V/abd pain or SOB/DOE or Serina Nichter edema. Unsure if defibrillator fired. History of paroxysmal v tach. Did not take anything for this. No new workouts, no new pushing/pulling/strenous activities. No e/o skin infection.   She denies HTN, XOL, CAD, DM. She has cardiac and pulmonary sarcoidosis with cardiomyopathy, paroxysmal ventricular tach non treated.  Cardiologist is Dr. 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 Depression   . Pertussis   . Pseudotumor cerebri     .Marland KitchenMarland KitchenDr Hyacinth Meeker and Dr. Dione Booze - dxed Jan 2009, on diamox  - LP 01/24/2008 at [facility] - Normal ( pressure 14cm, gluc 68, Protein 23, Clear, colorless, RBC 3, WBC - too few to count)  - CT head non contrast done at [facility] Radiology 11/25/2007: "normal". Ddoes not report MRI, MRA, MRV being done - 2nd opinon by Dr. Pearlean Brownie - reportdly had MRI/MRV both normal; followup pending  . Lupus pernio     -skin bx confirmed at [facility] 05/07/2009 (personally reviewed) -looks improved 07/09/2009 -  . Pulmonary sarcoidosis     - Cleda Daub 07/25/2010 on pred 10/mtx 10 -> FEv1 1.84L/775%, FVC 2.25L - SPiro 09/06/2010 -> Fev1 2.14L/81%. Cut pred to 5mg  per day. Cont mtx - SPiro 01/20/2011 - Fev1 1.6L/61%, Ratio 74%. CXR Worse -> increased  pred to 10mg  perday. Cont Mtx.    . Vitamin D deficiency     - Aug 2011: 22 and low -> commence Rx. - Jan 2012: 11  . Ventricular tachycardia    Past Surgical History  Procedure Laterality Date  . 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 Cardiac defibrillator placement      [employer]   History   Social History  . Marital Status: Legally Separated    Spouse Name: N/A    Number of Children: 1  . Years of Education: N/A   Occupational History  . school counsler TEFL teacher)    Social History Main Topics  . Smoking status: Former Smoker -- 1.00 packs/day for 15 years    Types: Cigarettes    Quit date: 03/03/2007  . Smokeless tobacco: None  . Alcohol Use: None  . Drug Use: None  . Sexually Active: None   Other Topics Concern  . 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 Wants letter    Family History  Problem Relation Age of Onset  . Allergies Mother    No Known Allergies Prior to Admission medications   Medication Sig Start Date End Date Taking? Authorizing Provider  acetaZOLAMIDE (DIAMOX) 250 MG tablet Take 250 mg by mouth 2 (two) times daily.     Historical Provider,  MD  calcium gluconate 500 MG tablet Take 500 mg by mouth daily.      Historical Provider, MD  Cholecalciferol (VITAMIN D3) 1000 UNITS CAPS Take 1 capsule by mouth daily.      Historical Provider, MD  ciprofloxacin (CIPRO) 250 MG tablet Take 1 tablet (250 mg total) by mouth 2 (two) times daily. 10/07/12   Nelva Nay, PA-C  ferrous sulfate 325 (65 FE) MG tablet Take 325 mg by mouth daily with breakfast.      Historical Provider, MD  folic acid (FOLVITE) 800 MCG tablet Take 800 mcg by mouth 2 (two) times daily.      Historical Provider, MD  HYDROcodone-acetaminophen (VICODIN) 5-500 MG per tablet Take 1 tablet by mouth every 8 (eight) hours as needed for pain. 10/07/12   Heather Jaquita Rector, PA-C  ibuprofen (ADVIL,MOTRIN) 200 MG  tablet Take 200 mg by mouth every 6 (six) hours as needed.      Historical Provider, MD  methotrexate (RHEUMATREX) 2.5 MG tablet Take 4 tabs every Monday. Caution:Chemotherapy. Protect from light. 02/11/13   Kalman Shan, MD  methylPREDNISolone (MEDROL) 8 MG tablet Take 0.5 tablets (4 mg total) by mouth daily. Stop prednisone 12/06/11 12/04/12  Kalman Shan, MD  metoprolol succinate (TOPROL-XL) 25 MG 24 hr tablet Take 3 tablets every morning and 1 tablet every evening 04/25/11   Duke Salvia, MD  sulfamethoxazole-trimethoprim (BACTRIM DS) 800-160 MG per tablet TAKE 1 TABLET BY MOUTH EVERY MONDAY, [facility] AND FRIDAY 02/11/13   Kalman Shan, MD  topiramate (TOPAMAX) 25 MG tablet Take 25 mg by mouth 2 (two) times daily.      Historical Provider, MD  valACYclovir (VALTREX) 1000 MG tablet Take 1 tablet (1,000 mg total) by mouth 3 (three) times daily. 10/08/12   Elvina Sidle, MD     ROS: The patient denies fevers, chills, night sweats, unintentional weight loss,  palpitations, wheezing, dyspnea on exertion, nausea, vomiting, abdominal pain, dysuria, hematuria, melena, numbness, weakness, or tingling.   All other systems have been reviewed and were otherwise negative with the exception of those mentioned in the HPI and as above.    PHYSICAL EXAM: Filed Vitals:   04/14/13 1303  BP:   Pulse: 89  Temp:   Resp:    Filed Vitals:   04/14/13 1257  Height: 5\' 5"  (1.651 m)  Weight: 219 lb (99.338 kg)   Body mass index is 36.44 kg/(m^2).  General: Alert, no acute distress HEENT:  Normocephalic, atraumatic, oropharynx patent.  Cardiovascular:  Regular rate and rhythm, no rubs murmurs or gallops.  No Carotid bruits, radial pulse intact. No pedal edema.  Tenderness at site of ICD, Pain with palpation and ROM Respiratory: Clear to auscultation bilaterally.  No wheezes, rales, or rhonchi.  No cyanosis, no use of accessory musculature GI: No organomegaly, abdomen is soft and non-tender,  positive bowel sounds.  No masses. Skin: No rashes. No e/o infection at site of ICD Neurologic: Facial musculature symmetric. Psychiatric: Patient is appropriate throughout our interaction. Lymphatic: No cervical lymphadenopathy Musculoskeletal: Gait intact.   LABS: Results for orders placed in visit on 04/14/13  POCT CBC      Result Value Range   WBC 8.7  4.6 - 10.2 K/uL   Lymph, poc 2.0  0.6 - 3.4   POC LYMPH PERCENT 23.4  10 - 50 %L   MID (cbc) 0.6  0 - 0.9   POC MID % 7.1  0 - 12 %M   POC Granulocyte 6.0  2 - 6.9   Granulocyte percent 69.5  37 - 80 %G   RBC 4.15  4.04 - 5.48 M/uL   Hemoglobin 11.2 (*) 12.2 - 16.2 g/dL   HCT, POC 16.1 (*) 09.6 - 47.9 %   MCV 88.7  80 - 97 fL   MCH, POC 27.0  27 - 31.2 pg   MCHC 30.4 (*) 31.8 - 35.4 g/dL   RDW, POC 04.5     Platelet Count, POC 572 (*) 142 - 424 K/uL   MPV 8.0  0 - 99.8 fL     EKG/XRAY:   Primary read interpreted by Dr. Conley Rolls at [facility]. EKG 86 bpm, SR, nonspecific ST-T wave cahnges CXR -no infiltrates or pneumothorax, ICD visible, leads appear to be in correct position Xray looks improved from 2012    ASSESSMENT/PLAN: Encounter Diagnoses  Name Primary?  . Chest pain Yes  . Costochondritis    Spoke with Dr. Odessa Fleming staff and they will do a device check today D/w patient different causes of CP, she currently declines to go to ER Will await for CMP and stat troponin I and if elevated then she will go Seems to be chest wall pain related to costochondritis since reproducible She was advise to go to ER for worsening sxs.  F/u prn   Zadaya Cuadra PHUONG, DO 04/14/2013 1:42 PM

## 2013-04-25 ENCOUNTER — Ambulatory Visit (INDEPENDENT_AMBULATORY_CARE_PROVIDER_SITE_OTHER): Payer: BC Managed Care – PPO | Admitting: Family Medicine

## 2013-04-25 ENCOUNTER — Encounter: Payer: Self-pay | Admitting: Family Medicine

## 2013-04-25 VITALS — BP 140/90 | HR 101 | Temp 99.2°F | Resp 16 | Ht 64.75 in | Wt 220.6 lb

## 2013-04-25 DIAGNOSIS — R7309 Other abnormal glucose: Secondary | ICD-10-CM

## 2013-04-25 DIAGNOSIS — Z0000 Encounter for general adult medical examination without abnormal findings: Secondary | ICD-10-CM

## 2013-04-25 DIAGNOSIS — D649 Anemia, unspecified: Secondary | ICD-10-CM

## 2013-04-25 DIAGNOSIS — R079 Chest pain, unspecified: Secondary | ICD-10-CM

## 2013-04-25 DIAGNOSIS — E559 Vitamin D deficiency, unspecified: Secondary | ICD-10-CM

## 2013-04-25 DIAGNOSIS — M940 Chondrocostal junction syndrome [Tietze]: Secondary | ICD-10-CM

## 2013-04-25 DIAGNOSIS — D473 Essential (hemorrhagic) thrombocythemia: Secondary | ICD-10-CM

## 2013-04-25 DIAGNOSIS — R7303 Prediabetes: Secondary | ICD-10-CM

## 2013-04-25 DIAGNOSIS — R252 Cramp and spasm: Secondary | ICD-10-CM

## 2013-04-25 DIAGNOSIS — R259 Unspecified abnormal involuntary movements: Secondary | ICD-10-CM

## 2013-04-25 LAB — IRON: Iron: 67 ug/dL (ref 42–145)

## 2013-04-25 LAB — COMPREHENSIVE METABOLIC PANEL
Albumin: 4.2 g/dL (ref 3.5–5.2)
Alkaline Phosphatase: 121 U/L — ABNORMAL HIGH (ref 39–117)
CO2: 29 mEq/L (ref 19–32)
Glucose, Bld: 84 mg/dL (ref 70–99)
Potassium: 4.5 mEq/L (ref 3.5–5.3)
Sodium: 142 mEq/L (ref 135–145)
Total Protein: 7.6 g/dL (ref 6.0–8.3)

## 2013-04-25 LAB — POCT GLYCOSYLATED HEMOGLOBIN (HGB A1C): Hemoglobin A1C: 5.8

## 2013-04-25 LAB — LIPID PANEL
LDL Cholesterol: 161 mg/dL — ABNORMAL HIGH (ref 0–99)
Triglycerides: 168 mg/dL — ABNORMAL HIGH (ref <150)

## 2013-04-25 LAB — FERRITIN: Ferritin: 51 ng/mL (ref 10–291)

## 2013-04-25 MED ORDER — METHYLPREDNISOLONE 4 MG PO KIT: PACK | ORAL | Status: DC

## 2013-04-25 MED ORDER — MELOXICAM 15 MG PO TABS: 15.0000 mg | ORAL_TABLET | Freq: Every day | ORAL | Status: DC

## 2013-04-25 MED ORDER — TOPIRAMATE 25 MG PO TABS: 25.0000 mg | ORAL_TABLET | Freq: Two times a day (BID) | ORAL | Status: DC

## 2013-04-25 NOTE — Progress Notes (Signed)
  Subjective:    Patient ID: Alexandra Hood, female    DOB: [birth year], 42 y.o.   MRN: [id number]  HPI    Review of Systems  Constitutional: Negative.   HENT: Negative.   Eyes: Negative.   Respiratory: Negative.   Cardiovascular: Positive for chest pain.  Gastrointestinal: Negative.   Endocrine: Negative.   Genitourinary: Negative.   Musculoskeletal: Negative.   Skin: Negative.   Allergic/Immunologic: Negative.   Neurological:       Toes twitching   Hematological: Negative.   Psychiatric/Behavioral: Negative.        Objective:   Physical Exam        Assessment & Plan:

## 2013-04-25 NOTE — Progress Notes (Signed)
  Subjective:    Patient ID: Alexandra Hood, female    DOB: [birth year], 42 y.o.   MRN: [id number] Chief Complaint  Patient presents with  . Annual Exam    HPI  Chest pain is constant but getting a little better. Constant - just lives w/ it. Told by cardiology to ibuprofen which she tried 1 tab twice a day but not helping.  Pain is over ICD, not radiating, no SHoB, no cough, no heartburn, when she first had the pain her left arm was numb but none since.  Taking vit D supplement 50,000K once a week.  Still has uterus but no overies. Did hormone replacement for a year after her ovaries were out. Last pap smear was last February, was normal.  Sexually active - last Dec, worse a condom. No h/o abnormal.  Has pap sched w/ gyn on May 20th - sees Dr. Richarda Overlie. Has a home BP cuff.  Did get pneumovax done at pulmonology office 2 yrs ago.l Review of Systems  All other systems reviewed and are negative.   See above note for complete ROS   BP 140/90  Pulse 101  Temp(Src) 99.2 F (37.3 C) (Oral)  Resp 16  Ht 5' 4.75" (1.645 m)  Wt 220 lb 9.6 oz (100.064 kg)  BMI 36.98 kg/m2  SpO2 98% Objective:   Physical Exam        Results for orders placed in visit on 04/25/13  POCT GLYCOSYLATED HEMOGLOBIN (HGB A1C)      Result Value Range   Hemoglobin A1C 5.8      Assessment & Plan:  Spasm  Routine general medical examination at a health care facility - Plan: Comprehensive metabolic panel, Lipid panel, TSH, Vitamin D 25 hydroxy, Ferritin, Iron, Magnesium, Phosphorus, POCT glycosylated hemoglobin (Hb A1C), CK  Unspecified vitamin D deficiency  Anemia - Plan: Iron  Thrombocytosis - Plan: POCT glycosylated hemoglobin (Hb A1C)  Pre-diabetes  Chest pain  Costochondritis  Meds ordered this encounter  Medications  . topiramate (TOPAMAX) 25 MG tablet    Sig: Take 1 tablet (25 mg total) by mouth 2 (two) times daily.    Dispense:  180 tablet    Refill:  3  .  methylPREDNISolone (MEDROL, PAK,) 4 MG tablet    Sig: follow package directions    Dispense:  21 tablet    Refill:  0  . meloxicam (MOBIC) 15 MG tablet    Sig: Take 1 tablet (15 mg total) by mouth daily.    Dispense:  30 tablet    Refill:  0

## 2013-04-25 NOTE — Patient Instructions (Addendum)
Call me in about a month with your blood pressures. Check once a week. We may need to start you on blood pressure medication.  DASH Diet The DASH diet stands for "Dietary Approaches to Stop Hypertension." It is a healthy eating plan that has been shown to reduce high blood pressure (hypertension) in as little as 14 days, while also possibly providing other significant health benefits. These other health benefits include reducing the risk of breast cancer after menopause and reducing the risk of type 2 diabetes, heart disease, colon cancer, and stroke. Health benefits also include weight loss and slowing kidney failure in patients with chronic kidney disease.  DIET GUIDELINES  Limit salt (sodium). Your diet should contain less than 1500 mg of sodium daily.  Limit refined or processed carbohydrates. Your diet should include mostly whole grains. Desserts and added sugars should be used sparingly.  Include small amounts of heart-healthy fats. These types of fats include nuts, oils, and tub margarine. Limit saturated and trans fats. These fats have been shown to be harmful in the body. CHOOSING FOODS  The following food groups are based on a 2000 calorie diet. See your Registered Dietitian for individual calorie needs. Grains and Grain Products (6 to 8 servings daily)  Eat More Often: Whole-wheat bread, brown rice, whole-grain or wheat pasta, quinoa, popcorn without added fat or salt (air popped).  Eat Less Often: White bread, white pasta, white rice, cornbread. Vegetables (4 to 5 servings daily)  Eat More Often: Fresh, frozen, and canned vegetables. Vegetables may be raw, steamed, roasted, or grilled with a minimal amount of fat.  Eat Less Often/Avoid: Creamed or fried vegetables. Vegetables in a cheese sauce. Fruit (4 to 5 servings daily)  Eat More Often: All fresh, canned (in natural juice), or frozen fruits. Dried fruits without added sugar. One hundred percent fruit juice ( cup [237 mL]  daily).  Eat Less Often: Dried fruits with added sugar. Canned fruit in light or heavy syrup. [employer], Fish, and Poultry (2 servings or less daily. One serving is 3 to 4 oz [85-114 g]).  Eat More Often: Ninety percent or leaner ground beef, tenderloin, sirloin. Round cuts of beef, chicken breast, [place] breast. All fish. Grill, bake, or broil your meat. Nothing should be fried.  Eat Less Often/Avoid: Fatty cuts of meat, [place], or chicken leg, thigh, or wing. Fried cuts of meat or fish. Dairy (2 to 3 servings)  Eat More Often: Low-fat or fat-free milk, low-fat plain or light yogurt, reduced-fat or part-skim cheese.  Eat Less Often/Avoid: Milk (whole, 2%).Whole milk yogurt. Full-fat cheeses. Nuts, Seeds, and Legumes (4 to 5 servings per week)  Eat More Often: All without added salt.  Eat Less Often/Avoid: Salted nuts and seeds, canned beans with added salt. Fats and Sweets (limited)  Eat More Often: Vegetable oils, tub margarines without trans fats, sugar-free gelatin. Mayonnaise and salad dressings.  Eat Less Often/Avoid: Coconut oils, palm oils, butter, stick margarine, cream, half and half, cookies, candy, pie. FOR MORE INFORMATION The Dash Diet Eating Plan: www.dashdiet.org Document Released: 11/30/2011 Document Revised: 03/04/2012 Document Reviewed: 11/30/2011 [facility] Patient Information 2013 [place], [place].  Keeping You Healthy  Get These Tests 1. Blood Pressure- Have your blood pressure checked once a year by your health care provider.  Normal blood pressure is 120/80. 2. Weight- Have your body mass index (BMI) calculated to screen for obesity.  BMI is measure of body fat based on height and weight.  You can also calculate your own BMI  at https://www.west-esparza.com/. 3. Cholesterol- Have your cholesterol checked every 5 years starting at age 28 then yearly starting at age 25. 4. Chlamydia, HIV, and other sexually transmitted diseases- Get screened every year until age 20,  then within three months of each new sexual provider. 5. Pap Smear- Every 1-3 years; discuss with your health care provider. 6. Mammogram- Every year starting at age 75  Take these medicines  Calcium with Vitamin D-Your body needs 1200 mg of Calcium each day and [phone] IU of Vitamin D daily.  Your body can only absorb 500 mg of Calcium at a time so Calcium must be taken in 2 or 3 divided doses throughout the day.  Multivitamin with folic acid- Once daily if it is possible for you to become pregnant.  Get these Immunizations  Gardasil-Series of three doses; prevents HPV related illness such as genital warts and cervical cancer.  Menactra-Single dose; prevents meningitis.  Tetanus shot- Every 10 years.  Flu shot-Every year.  Take these steps 1. Do not smoke-Your healthcare provider can help you quit.  For tips on how to quit go to www.smokefree.gov or call 1-800 QUITNOW. 2. Be physically active- Exercise 5 days a week for at least 30 minutes.  If you are not already physically active, start slow and gradually work up to 30 minutes of moderate physical activity.  Examples of moderate activity include walking briskly, dancing, swimming, bicycling, etc. 3. Breast Cancer- A self breast exam every month is important for early detection of breast cancer.  For more information and instruction on self breast exams, ask your healthcare provider or [place].[employer]. 4. Eat a healthy diet- Eat a variety of healthy foods such as fruits, vegetables, whole grains, low fat milk, low fat cheeses, yogurt, lean meats, poultry and fish, beans, nuts, tofu, etc.  For more information go to www. Thenutritionsource.org 5. Drink alcohol in moderation- Limit alcohol intake to one drink or less per day. Never drink and drive. 6. Depression- Your emotional health is as important as your physical health.  If you're feeling down or losing interest in things you normally enjoy please talk to  your healthcare provider about being screened for depression. 7. Dental visit- Brush and floss your teeth twice daily; visit your dentist twice a year. 8. Eye doctor- Get an eye exam at least every 2 years. 9. Helmet use- Always wear a helmet when riding a bicycle, motorcycle, rollerblading or skateboarding. 10. Safe sex- If you may be exposed to sexually transmitted infections, use a condom. 11. Seat belts- Seat belts can save your live; always wear one. 12. Smoke/Carbon Monoxide detectors- These detectors need to be installed on the appropriate level of your home. Replace batteries at least once a year. 13. Skin cancer- When out in the sun please cover up and use sunscreen 15 SPF or higher. 14. Violence- If anyone is threatening or hurting you, please tell your healthcare provider.

## 2013-04-29 ENCOUNTER — Encounter: Payer: Self-pay | Admitting: Internal Medicine

## 2013-07-21 ENCOUNTER — Ambulatory Visit (INDEPENDENT_AMBULATORY_CARE_PROVIDER_SITE_OTHER): Payer: BC Managed Care – PPO | Admitting: *Deleted

## 2013-07-21 DIAGNOSIS — I429 Cardiomyopathy, unspecified: Secondary | ICD-10-CM

## 2013-07-21 DIAGNOSIS — Z95810 Presence of automatic (implantable) cardiac defibrillator: Secondary | ICD-10-CM

## 2013-07-29 LAB — REMOTE ICD DEVICE
AL AMPLITUDE: 2.5 mv
ATRIAL PACING ICD: 0.01 pct
BAMS-0001: 170 {beats}/min
FVT: 0
PACEART VT: 0
RV LEAD IMPEDENCE ICD: 342 Ohm
RV LEAD THRESHOLD: 0.75 V
TOT-0001: 1
TZAT-0001ATACH: 3
TZAT-0001FASTVT: 1
TZAT-0002ATACH: NEGATIVE
TZAT-0002ATACH: NEGATIVE
TZAT-0012ATACH: 150 ms
TZAT-0012SLOWVT: 170 ms
TZAT-0018ATACH: NEGATIVE
TZAT-0019ATACH: 6 V
TZAT-0019ATACH: 6 V
TZAT-0019SLOWVT: 8 V
TZAT-0020ATACH: 1.5 ms
TZAT-0020ATACH: 1.5 ms
TZAT-0020FASTVT: 1.5 ms
TZAT-0020SLOWVT: 1.5 ms
TZON-0003SLOWVT: 360 ms
TZON-0003VSLOWVT: 350 ms
TZON-0004SLOWVT: 16
TZST-0001ATACH: 5
TZST-0001ATACH: 6
TZST-0001FASTVT: 2
TZST-0001FASTVT: 4
TZST-0001SLOWVT: 2
TZST-0001SLOWVT: 3
TZST-0001SLOWVT: 5
TZST-0002ATACH: NEGATIVE
TZST-0002ATACH: NEGATIVE
TZST-0002FASTVT: NEGATIVE
TZST-0002FASTVT: NEGATIVE
TZST-0002FASTVT: NEGATIVE
TZST-0002SLOWVT: NEGATIVE
TZST-0002SLOWVT: NEGATIVE
TZST-0002SLOWVT: NEGATIVE
VENTRICULAR PACING ICD: 0.04 pct
VF: 0

## 2013-08-06 ENCOUNTER — Encounter: Payer: Self-pay | Admitting: *Deleted

## 2013-08-11 ENCOUNTER — Encounter: Payer: Self-pay | Admitting: Internal Medicine

## 2013-09-02 ENCOUNTER — Telehealth: Payer: Self-pay | Admitting: Internal Medicine

## 2013-09-02 DIAGNOSIS — G932 Benign intracranial hypertension: Secondary | ICD-10-CM

## 2013-09-02 NOTE — Telephone Encounter (Signed)
Spoke with the pt and she states her headaches have gotten worse and she is asking to have another referrall to neuro for this. 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 Please advise. Carron Curie, CMA   Per MR we can place the order but the pt needs to set follow-up with [place]. Pt has an appt on 10-14-13. Carron Curie, CMA Order placed. Carron Curie, CMA

## 2013-09-11 ENCOUNTER — Encounter: Payer: Self-pay | Admitting: Internal Medicine

## 2013-09-24 ENCOUNTER — Ambulatory Visit: Payer: BC Managed Care – PPO | Admitting: Neurology

## 2013-09-24 ENCOUNTER — Encounter: Payer: Self-pay | Admitting: Neurology

## 2013-09-24 ENCOUNTER — Ambulatory Visit (INDEPENDENT_AMBULATORY_CARE_PROVIDER_SITE_OTHER): Payer: BC Managed Care – PPO | Admitting: Emergency Medicine

## 2013-09-24 VITALS — BP 134/88 | HR 110 | Temp 98.1°F | Resp 18 | Ht 65.0 in | Wt 214.0 lb

## 2013-09-24 DIAGNOSIS — D869 Sarcoidosis, unspecified: Secondary | ICD-10-CM

## 2013-09-24 DIAGNOSIS — G932 Benign intracranial hypertension: Secondary | ICD-10-CM

## 2013-09-24 LAB — COMPREHENSIVE METABOLIC PANEL
AST: 15 U/L (ref 0–37)
Alkaline Phosphatase: 114 U/L (ref 39–117)
BUN: 9 mg/dL (ref 6–23)
Creat: 0.7 mg/dL (ref 0.50–1.10)
Glucose, Bld: 88 mg/dL (ref 70–99)
Total Bilirubin: 0.3 mg/dL (ref 0.3–1.2)

## 2013-09-24 LAB — POCT CBC
Granulocyte percent: 68.6 %G (ref 37–80)
Hemoglobin: 13.1 g/dL (ref 12.2–16.2)
Lymph, poc: 2.8 (ref 0.6–3.4)
MCHC: 31.4 g/dL — AB (ref 31.8–35.4)
MID (cbc): 0.5 (ref 0–0.9)
MPV: 8.7 fL (ref 0–99.8)
POC Granulocyte: 7.3 — AB (ref 2–6.9)
POC LYMPH PERCENT: 26.8 %L (ref 10–50)
POC MID %: 4.6 %M (ref 0–12)
Platelet Count, POC: 582 10*3/uL — AB (ref 142–424)
RDW, POC: 15.8 %

## 2013-09-24 MED ORDER — ACETAZOLAMIDE 250 MG PO TABS: 500.0000 mg | ORAL_TABLET | Freq: Two times a day (BID) | ORAL | Status: DC

## 2013-09-24 NOTE — Patient Instructions (Addendum)
Idiopathic Intracranial Hypertension  °Idiopathic intracranial hypertension (IIH) is a neurological condition caused by the build up of cerebrospinal fluid within the brain. It is sometimes referred to as benign intracranial hypertension or pseudotumor cerebri. It is not caused by brain tumors. IIH can occur in all genders and age groups but is most common in very overweight (obese) women of childbearing age.  °SYMPTOMS  °The buildup of cerebrospinal fluid increases pressure around the brain (intracranial pressure) and cause symptoms such as: °· Headache. °· Nausea. °· Vomiting. °· A "rushing of water" sound within the ears (pulsatile tinnitus). °· Double vision. °DIAGNOSIS  °Idiopathic intracranial hypertension is diagnosed with the aid of different exams: °· Brain scans such as: °· Computerized tomography (CT scan). °· Magnetic resonance imaging (MRI scan). °· Magnetic resonance venography (MRV). °· Lumbar puncture (spinal tap). This procedure can determine if there is too much spinal fluid within the central nervous system. Too much spinal fluid can increase intracranial pressure. °· A thorough eye exam will be done to look for swelling within the eyes. Visual field testing will also be done to see if any damage has occurred to nerves in the eyes. °TREATMENT  °Treatment of idiopathic intracranial hypertension is based on symptoms. Idiopathic intracranial hypertension can cause vision loss and blindness if left untreated. Common treatments include: °· Lumbar puncture (spinal taps) to remove excess spinal fluid. °· Medication. °· Surgery. °SEEK IMMEDIATE MEDICAL CARE IF:  °You experience any of the following, such as: °· Sudden, unexplained severe headache. °· Persistent feeling of sickness in your stomach (nausea) or throwing up (vomiting) that does not go away. °· Double vision or vision changes. °· Dizziness or feeling faint. °Document Released: 02/19/2002 Document Revised: 03/04/2012 Document Reviewed:  09/15/2008 °Exit[employer]® Patient Information ©2014 [employer]. ° °

## 2013-09-24 NOTE — Progress Notes (Signed)
[facility] [address] [place] [address] [phone]  Date:  09/24/2013   Name:  Alexandra Hood   DOB:  1971-07-04   MRN:  [id number]  PCP:  Tally Due, MD    Chief Complaint: Headache   History of Present Illness:  Alexandra Hood is a 42 y.o. very pleasant female patient who presents with the following:  History of pseudotumor cerebri with increasing headaches on 250 mg BID of diamox.  No visual symptoms, no focal neurologic symptoms.  No tinnitus.  No weakness, gait disturbance, ataxia, dizziness. No nausea or vomiting.  No follow up for 4-5 years.  Has sarcoidosis both pulmonary and cardiac.  Says the headaches are much worse in morning on arising and never quite resolve.  No improvement with over the counter medications or other home remedies. Denies other complaint or health concern today.   Patient Active Problem List   Diagnosis Date Noted  . 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 Thrombocytosis 03/06/2012  . Obesity 03/06/2012  . Pre-diabetes 03/06/2012  . 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 Keloid--ICD incision 04/25/2011  . implantable defibrillator-dual chamber Medtronic 04/18/2011  . DEPRESSION 02/15/2011  . PERTUSSIS 01/20/2011  . Unspecified vitamin D deficiency 01/20/2011  . VENTRICULAR TACHYCARDIA 01/20/2011  . Sarcoidosis 05/12/2009  . PSEUDOTUMOR CEREBRI 05/12/2009    Past Medical History  Diagnosis Date  . Depression   . Pertussis   . Pseudotumor cerebri     .Marland KitchenMarland KitchenDr Hyacinth Meeker and Dr. Dione Booze - dxed Jan 2009, on diamox  - LP 01/24/2008 at [facility] - Normal ( pressure 14cm, gluc 68, Protein 23, Clear, colorless, RBC 3, WBC - too few to count)  - CT head non contrast done at [facility] 11/25/2007: "normal". Ddoes not report MRI, MRA, MRV being done - 2nd opinon by Dr. Pearlean Brownie - reportdly had MRI/MRV both normal; followup pending  . Lupus pernio     -skin bx confirmed at [facility] 05/07/2009 (personally  reviewed) -looks improved 07/09/2009 -  . Pulmonary sarcoidosis     - Cleda Daub 07/25/2010 on pred 10/mtx 10 -> FEv1 1.84L/775%, FVC 2.25L - SPiro 09/06/2010 -> Fev1 2.14L/81%. Cut pred to 5mg  per day. Cont mtx - SPiro 01/20/2011 - Fev1 1.6L/61%, Ratio 74%. CXR Worse -> increased pred to 10mg  perday. Cont Mtx.    . Vitamin D deficiency     - Aug 2011: 22 and low -> commence Rx. - Jan 2012: 11  . Ventricular tachycardia     Past Surgical History  Procedure Laterality Date  . 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 Cardiac defibrillator placement      Medtronic    History  Substance Use Topics  . Smoking status: Former Smoker -- 1.00 packs/day for 15 years    Types: Cigarettes    Quit date: 03/03/2007  . Smokeless tobacco: Not on file  . Alcohol Use: No    Family History  Problem Relation Age of Onset  . Allergies Mother   . Diabetes Mother   . Hypertension Mother   . Hyperlipidemia Mother   . Hyperlipidemia Father   . Hypertension Father   . Hyperlipidemia Sister   . COPD Maternal Grandfather   . Leukemia Paternal Grandmother   . Heart attack Paternal Grandfather     No Known Allergies  Medication list has been reviewed and updated.  Current Outpatient Prescriptions on File Prior to Visit  Medication Sig Dispense Refill  . acetaZOLAMIDE (DIAMOX) 250  MG tablet Take 250 mg by mouth 2 (two) times daily.       . calcium gluconate 500 MG tablet Take 500 mg by mouth daily.        . Cholecalciferol (VITAMIN D3) 1000 UNITS CAPS Take 1 capsule by mouth daily.        . ferrous sulfate 325 (65 FE) MG tablet Take 325 mg by mouth daily with breakfast.        . folic acid (FOLVITE) 800 MCG tablet Take 800 mcg by mouth 2 (two) times daily.        Marland Kitchen ibuprofen (ADVIL,MOTRIN) 200 MG tablet Take 200 mg by mouth every 6 (six) hours as needed.        . methotrexate (RHEUMATREX) 2.5 MG tablet Take 4 tabs every Monday. Caution:Chemotherapy. Protect from light.  16 tablet  6  . methylPREDNISolone  (MEDROL) 8 MG tablet Take 0.5 tablets (4 mg total) by mouth daily. Stop prednisone  30 tablet  5  . metoprolol succinate (TOPROL-XL) 25 MG 24 hr tablet Take 3 tablets every morning and 1 tablet every evening  120 tablet  11  . topiramate (TOPAMAX) 25 MG tablet Take 1 tablet (25 mg total) by mouth 2 (two) times daily.  180 tablet  3  . meloxicam (MOBIC) 15 MG tablet Take 1 tablet (15 mg total) by mouth daily.  30 tablet  0  . methylPREDNISolone (MEDROL, PAK,) 4 MG tablet follow package directions  21 tablet  0   No current facility-administered medications on file prior to visit.    Review of Systems:  As per HPI, otherwise negative.    Physical Examination: Filed Vitals:   09/24/13 1351  BP: 134/88  Pulse: 110  Temp: 98.1 F (36.7 C)  Resp: 18   Filed Vitals:   09/24/13 1351  Height: 5\' 5"  (1.651 m)  Weight: 214 lb (97.07 kg)   Body mass index is 35.61 kg/(m^2). Ideal Body Weight: Weight in (lb) to have BMI = 25: 149.9  GEN: WDWN, NAD, Non-toxic, A & O x 3 HEENT: Atraumatic, Normocephalic. Neck supple. No masses, No LAD.  CN 2-12 intact PRRERLA EOMI fundi benign Ears and Nose: No external deformity. CV: RRR, No M/G/R. No JVD. No thrill. No extra heart sounds. PULM: CTA B, no wheezes, crackles, rhonchi. No retractions. No resp. distress. No accessory muscle use. ABD: S, NT, ND, +BS. No rebound. No HSM. EXTR: No c/c/e NEURO Normal gait. Romberg and tandem gait, toe and heel walk intact PSYCH: Normally interactive. Conversant. Not depressed or anxious appearing.  Calm demeanor.    Assessment and Plan: Pseudotumor Refer to neuro Increase diamox   Signed,  Phillips Odor, MD

## 2013-09-29 ENCOUNTER — Ambulatory Visit
Admission: RE | Admit: 2013-09-29 | Discharge: 2013-09-29 | Disposition: A | Discharge status: Home / Self Care | Payer: BC Managed Care – PPO | Source: Ambulatory Visit | Attending: Emergency Medicine | Admitting: Emergency Medicine

## 2013-09-29 DIAGNOSIS — G932 Benign intracranial hypertension: Secondary | ICD-10-CM

## 2013-10-14 ENCOUNTER — Encounter: Payer: Self-pay | Admitting: Internal Medicine

## 2013-10-14 ENCOUNTER — Encounter: Payer: Self-pay | Admitting: *Deleted

## 2013-10-14 ENCOUNTER — Other Ambulatory Visit: Payer: BC Managed Care – PPO

## 2013-10-14 ENCOUNTER — Encounter: Payer: BC Managed Care – PPO | Admitting: Internal Medicine

## 2013-10-14 ENCOUNTER — Ambulatory Visit (INDEPENDENT_AMBULATORY_CARE_PROVIDER_SITE_OTHER): Payer: BC Managed Care – PPO | Admitting: Internal Medicine

## 2013-10-14 ENCOUNTER — Ambulatory Visit (INDEPENDENT_AMBULATORY_CARE_PROVIDER_SITE_OTHER)
Admission: RE | Admit: 2013-10-14 | Discharge: 2013-10-14 | Disposition: A | Discharge status: Home / Self Care | Payer: BC Managed Care – PPO | Source: Ambulatory Visit | Attending: Internal Medicine | Admitting: Internal Medicine

## 2013-10-14 VITALS — BP 128/82 | HR 97 | Temp 97.3°F | Ht 63.5 in | Wt 134.4 lb

## 2013-10-14 DIAGNOSIS — J99 Respiratory disorders in diseases classified elsewhere: Secondary | ICD-10-CM

## 2013-10-14 DIAGNOSIS — E559 Vitamin D deficiency, unspecified: Secondary | ICD-10-CM

## 2013-10-14 DIAGNOSIS — D860 Sarcoidosis of lung: Secondary | ICD-10-CM

## 2013-10-14 DIAGNOSIS — D869 Sarcoidosis, unspecified: Secondary | ICD-10-CM

## 2013-10-14 DIAGNOSIS — I472 Ventricular tachycardia, unspecified: Secondary | ICD-10-CM

## 2013-10-14 DIAGNOSIS — Z23 Encounter for immunization: Secondary | ICD-10-CM

## 2013-10-14 NOTE — Patient Instructions (Addendum)
#  Pulmonary and lupus pernio sarcoidosis - apperas to have resolved based on cxr and spirometry - Cut your methyl prednisone down to 2 mg daily - Cut methotrexate down to 5 mg once a week   #Cardiac sacoid  - will re-refer cards to have interrogation - if no V Tach, will work on [employer] steroids and methotrexate permanemntt  #Vitamin D deficiency - Recheck levels today and we will call with the results  #Followup 3 months with spirometry

## 2013-10-14 NOTE — Progress Notes (Signed)
Subjective:    Patient ID: Alexandra Hood, female    DOB: 1971/09/13, 42 y.o.   MRN: [id number]  HPI  Patient ID: Alexandra Hood, female    DOB: 1971/05/26, 42 y.o.   MRN: [id number]  HPI 1. Stage 2 Pulmonary sarcoid   -pfts worsen when pred tapered  - s/p 2nd opinion 2010 with Dr Leta Speller at [facility] and 2012 at [facility] on steroids (medrol at her request since Jan 201) and methotrexate and bactrim   2. 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 Lupus Pernio (nasal bridge) 5. VIt D Deficiency. 6. Obesity and Pseudotumor cerebrii Body mass index is 23.43 kg/(m^2). on 10/14/2013 7. Pre-diabetes  HgbA1c 6.5 on 09/06/10    OV 07/18/2011: Followup for above. Last seen May Jan 2012. At that time, stilli  Tachycardic. Dr Graciela Husbands increased lopressor in June 2012 and now normocardic. Now continuing medrol 8mg  per day instead of prednisone but headaches of pseudoetumor cerebrii persists on and off 2 tiimes per week at mild levels. Appointments with Dr. Dione Booze and [place] at [facility] still pending. Lupus pernio is resolved.  She insists she is  compliant with medrol 8mg  daily and 10mg  methtotrexate once a week and bactrim prophylaxis. Denies dyspnea or cough but admits to weight gain. No other issues. Last cXr was in marc 9. 2012 and showed stable pulmnary sarcoid. Last LFTs Jan 2012 and chemistries March 2012 - normal.   REC Continue your current medications. If you want your medrol dose cut down, call us  Please see Dr Hyacinth Meeker for headaches  Return in 4 months - at that time do spirometry, lft and vitamin d levels   OV 12/06/2011 Follow up for above. Last seen July 2012. Is a 6 month followup. Feels great. Looking forward to holidays.. No active complaints. Tolerating methotrexate, Medrol and Bactrim for sarcoidosis quite well. Her ICD has not fired and she's not having tachycardia from a cardiac sarcoid. She is due to see Dr.  Graciela Husbands soon. The keloid from  her ICD is persistent for is not bothering her. Her lupus pernio in her nose is still in remission.  Her headaches from pseudotumor cerebri continue on off but they are better. She feels that Medrol works better than prednisone in terms of her mood and weight. She has not had blood tests since spring 2012. She says she is compliant with her medications including vitamin D for vitamin D deficiency.  Spirometry today shows Fev1 2.02L/79%. Ratio - normal.  This is essentially baseline for her  REC #Sarcoid Your lung function is 2L/79% and nearly normal Glad you are feeling well Please cut down medrol dose to 4mg  per day Continue  Methotrexate at 10mg  per week; depending on your test results we might cut this down further Continue bactrim as before Have blood test today for cbcd, lft, bmet - will call you with results #VIt D Def - continue vit d replacement - recheck level today #followup  - 5 months - spirometry at followup   OV march 2013  followup Sarcoid, Vit D Def, obesity  Getting over cold - nephews and parents sick. Got sick end of 4-5 days ago. Getting better. HAs related cough due to URI but is resolving. From sarcoid: asymptomatic. Weight continues to be an issue. She does not want to weigh herself duue to embarassment. Currently on medrol 4mg  daily (reduced dose in dec 2012) and methotreaxate 10mg  per  week and bactrim 3 per week. Spirometry today continues to hold well despiute cutting down prednisone dose and in fact is best ever: Fev1 2.11/82%, FVC 2.47L/81%, Ratip 85 (101%). Of note, reviewing her labs over the past year I find that she has thrombocytosis > 500 count for over a year. Also, she has not had her HgbA1c checked since 2011 and Vit D checked in a while.   Past, Family, Social reviewed: no change since last visit     OV 10/14/2013 Stage II pulmonary sarcoidosis with lupus pernio and cardiac sarcoid s/p AICD on methotrexate and  methylprednisolone, vitamin D deficiency, obesity  I have not seen her since March 2013. She has no specific reason for not following up. She says in the interim she is doing extremely well. Essentially asymptomatic. Lupus pernio has resolved. Denies cough, shortness of breath, chest tightness and wheezing. She says she's been compliant with her methotrexate at 7.5 mg once a week and methylprednisolone 4 milligrams daily. Not taking her Bactrim. Lab work done to first 2014 by primary care physician reveals normal creatinine normal liver function test and normal CBC with her baseline unchanged idiopathic thrombocytosis. Glucose on that day was 88 mg percent. She's not had spirometry her chest x-ray long time approximately 18 months.  Spirometry today's FEV1 2.1 L/84%. FVC 2.5 L 87%. Ratio of 82/98%. And this is her baseline best. CXR today shows just stranding and comaared to 2011 and 2012 cxr, the sarcoidosis has now burnt out  Of note, denies AICD firing as well  \   Regarding  vitamin D deficiency: She continues to take vitamin D supplements but this level has not been checked in a long time  Regarding obesity: Her weight is unchanged she continues to remain morbidly obese   Review of Systems  Constitutional: Negative for fever and unexpected weight change.  HENT: Negative for congestion, dental problem, ear pain, nosebleeds, postnasal drip, rhinorrhea, sinus pressure, sneezing, sore throat and trouble swallowing.   Eyes: Negative for redness and itching.  Respiratory: Negative for cough, chest tightness, shortness of breath and wheezing.   Cardiovascular: Negative for palpitations and leg swelling.  Gastrointestinal: Negative for nausea and vomiting.  Genitourinary: Negative for dysuria.  Musculoskeletal: Negative for joint swelling.  Skin: Negative for rash.  Neurological: Negative for headaches.  Hematological: Does not bruise/bleed easily.  Psychiatric/Behavioral: Negative for  dysphoric mood. The patient is not nervous/anxious.    Current outpatient prescriptions:acetaZOLAMIDE (DIAMOX) 250 MG tablet, Take 2 tablets (500 mg total) by mouth 2 (two) times daily., Disp: 120 tablet, Rfl: 1;  calcium gluconate 500 MG tablet, Take 500 mg by mouth daily.  , Disp: , Rfl: ;  Cholecalciferol (VITAMIN D3) 1000 UNITS CAPS, Take 1 capsule by mouth daily.  , Disp: , Rfl: ;  ferrous sulfate 325 (65 FE) MG tablet, Take 325 mg by mouth daily with breakfast.  , Disp: , Rfl:  folic acid (FOLVITE) 800 MCG tablet, Take 800 mcg by mouth 2 (two) times daily.  , Disp: , Rfl: ;  ibuprofen (ADVIL,MOTRIN) 200 MG tablet, Take 200 mg by mouth every 6 (six) hours as needed.  , Disp: , Rfl: ;  meloxicam (MOBIC) 15 MG tablet, Take 1 tablet (15 mg total) by mouth daily., Disp: 30 tablet, Rfl: 0;  methotrexate (RHEUMATREX) 2.5 MG tablet, Take 4 tabs every Monday. Caution:Chemotherapy. Protect from light., Disp: 16 tablet, Rfl: 6 methylPREDNISolone (MEDROL, PAK,) 4 MG tablet, follow package directions, Disp: 21 tablet, Rfl: 0;  metoprolol  succinate (TOPROL-XL) 25 MG 24 hr tablet, Take 3 tablets every morning and 1 tablet every evening, Disp: 120 tablet, Rfl: 11;  topiramate (TOPAMAX) 25 MG tablet, Take 1 tablet (25 mg total) by mouth 2 (two) times daily., Disp: 180 tablet, Rfl: 3;  UNABLE TO FIND, Med Name: tumeric, Disp: , Rfl:  methylPREDNISolone (MEDROL) 8 MG tablet, Take 0.5 tablets (4 mg total) by mouth daily. Stop prednisone, Disp: 30 tablet, Rfl: 5      Objective:   Physical Exam  hysical Exam Vitals reviewed. Constitutional: She is oriented to person, place, and time. She appears well-developed and well-nourished. No distress.       Obese Sunglass on as before . LUPUS PERNIO RESOLVED HENT:  Head: Normocephalic and atraumatic.  Right Ear: External ear normal.  Left Ear: External ear normal.  Mouth/Throat: Oropharynx is clear and moist. No oropharyngeal exudate.  Eyes: Conjunctivae and EOM are  normal. Pupils are equal, round, and reactive to light. Right eye exhibits no discharge. Left eye exhibits no discharge. No scleral icterus.  Neck: Normal range of motion. Neck supple. No JVD present. No tracheal deviation present. No thyromegaly present.  Cardiovascular: Normal rate, regular rhythm, normal heart sounds and intact distal pulses.  Exam reveals no gallop and no friction rub.   No murmur heard. Pulmonary/Chest: Effort normal and breath sounds normal. No respiratory distress. She has no wheezes. She has no rales. She exhibits no tenderness.  Abdominal: Soft. Bowel sounds are normal. She exhibits no distension and no mass. There is no tenderness. There is no rebound and no guarding.  Musculoskeletal: Normal range of motion. She exhibits no edema and no tenderness.  Lymphadenopathy:    She has no cervical adenopathy.  Neurological: She is alert and oriented to person, place, and time. She has normal reflexes. No cranial nerve deficit. She exhibits normal muscle tone. Coordination normal.  Skin: Skin is warm and dry. No rash noted. She is not diaphoretic. No erythema. No pallor.       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 Her behavior is normal. Judgment and thought content normal.    Dg Chest 2 View  10/14/2013   CLINICAL DATA:  History of sarcoidosis  EXAM: CHEST  2 VIEW  COMPARISON:  Chest x-ray of 04/14/2013  FINDINGS: Scarring in the upper lobes right greater than left appears stable. No active infiltrate or effusion is seen. No definite mediastinal or hilar adenopathy is noted. Mild cardiomegaly is stable and defibrillator leads remain.  IMPRESSION: Stable linear scarring in the upper lobes right greater than left. No definite active process.   Electronically Signed   By: Dwyane Dee M.D.   On: 10/14/2013 11:02          Assessment & Plan:

## 2013-10-17 ENCOUNTER — Telehealth: Payer: Self-pay | Admitting: Internal Medicine

## 2013-10-17 NOTE — Telephone Encounter (Signed)
Give her appt with Dr Shary Key Cards - to see if AICD and cardiac sarcoid also quiet, if so my plan is to start slowly weaning meds off  Do not know why she has not checked her vit d?  Unable to close my chart?  Dr. Kalman Shan, M.D., [facility].C.P Pulmonary and Critical Care Medicine Staff Physician [facility] [facility] Pager: [phone], If no answer or between  15:00h - 7:00h: call [phone]  10/17/2013 9:38 PM

## 2013-10-17 NOTE — Assessment & Plan Note (Signed)
Vitamin D deficiency - Recheck levels today and we will call with the results

## 2013-10-17 NOTE — Assessment & Plan Note (Addendum)
#  Pulmonary and lupus pernio sarcoidosis - apperas to have resolved based on cxr and spirometry (since diagnosis and starting treatment in summer 2010) - Cut your methyl prednisone down to 2 mg daily - Cut methotrexate down to 5 mg once a week   #Followup 3 months with spirometry

## 2013-10-17 NOTE — Assessment & Plan Note (Signed)
#  Cardiac sacoid  - will re-refer cards to have interrogation - if no V Tach in last 6 momths, will work on [employer] steroids and methotrexate permanemntt #

## 2013-10-19 LAB — VITAMIN D 1,25 DIHYDROXY
Vitamin D 1, 25 (OH)2 Total: 52 pg/mL (ref 18–72)
Vitamin D3 1, 25 (OH)2: 52 pg/mL

## 2013-10-20 NOTE — Progress Notes (Signed)
Quick Note:  Advised pt of lab results per MR. Pt verbalized understanding and has no further questions at this time  ______

## 2013-10-22 NOTE — Telephone Encounter (Signed)
Pt has an appt on 10-30 with cardiology. She had vitamin d level and she is aware of results. What message are you getting about your note? Carron Curie, CMA

## 2013-10-23 ENCOUNTER — Encounter: Payer: Self-pay | Admitting: Internal Medicine

## 2013-10-23 ENCOUNTER — Ambulatory Visit (INDEPENDENT_AMBULATORY_CARE_PROVIDER_SITE_OTHER): Payer: BC Managed Care – PPO | Admitting: Internal Medicine

## 2013-10-23 ENCOUNTER — Encounter: Payer: Self-pay | Admitting: *Deleted

## 2013-10-23 VITALS — BP 118/72 | HR 109 | Ht 64.0 in | Wt 220.0 lb

## 2013-10-23 DIAGNOSIS — I472 Ventricular tachycardia: Secondary | ICD-10-CM

## 2013-10-23 DIAGNOSIS — I498 Other specified cardiac arrhythmias: Secondary | ICD-10-CM

## 2013-10-23 DIAGNOSIS — I429 Cardiomyopathy, unspecified: Secondary | ICD-10-CM

## 2013-10-23 DIAGNOSIS — Z4502 Encounter for adjustment and management of automatic implantable cardiac defibrillator: Secondary | ICD-10-CM

## 2013-10-23 DIAGNOSIS — R000 Tachycardia, unspecified: Secondary | ICD-10-CM

## 2013-10-23 DIAGNOSIS — Z95810 Presence of automatic (implantable) cardiac defibrillator: Secondary | ICD-10-CM

## 2013-10-23 DIAGNOSIS — T82198A Other mechanical complication of other cardiac electronic device, initial encounter: Secondary | ICD-10-CM | POA: Insufficient documentation

## 2013-10-23 DIAGNOSIS — G932 Benign intracranial hypertension: Secondary | ICD-10-CM

## 2013-10-23 LAB — ICD DEVICE OBSERVATION
AL AMPLITUDE: 2.9 mv
AL IMPEDENCE ICD: 494 Ohm
AL THRESHOLD: 0.5 V
ATRIAL PACING ICD: 0 pct
BATTERY VOLTAGE: 3.1 V
CHARGE TIME: 9.7 s
DEV-0020ICD: NEGATIVE
RV LEAD AMPLITUDE: 2.5 mv
TZAT-0001ATACH: 1
TZAT-0001FASTVT: 1
TZAT-0001SLOWVT: 1
TZAT-0002ATACH: NEGATIVE
TZAT-0002ATACH: NEGATIVE
TZAT-0002FASTVT: NEGATIVE
TZAT-0002SLOWVT: NEGATIVE
TZAT-0012ATACH: 150 ms
TZAT-0012FASTVT: 170 ms
TZAT-0018FASTVT: NEGATIVE
TZAT-0019ATACH: 6 V
TZAT-0019ATACH: 6 V
TZAT-0019ATACH: 6 V
TZAT-0020ATACH: 1.5 ms
TZAT-0020ATACH: 1.5 ms
TZAT-0020SLOWVT: 1.5 ms
TZON-0003SLOWVT: 360 ms
TZON-0003VSLOWVT: 350 ms
TZON-0005SLOWVT: 12
TZST-0001ATACH: 4
TZST-0001ATACH: 5
TZST-0001FASTVT: 4
TZST-0001FASTVT: 5
TZST-0001SLOWVT: 2
TZST-0001SLOWVT: 3
TZST-0001SLOWVT: 4
TZST-0001SLOWVT: 5
TZST-0002ATACH: NEGATIVE
TZST-0002FASTVT: NEGATIVE
TZST-0002FASTVT: NEGATIVE
TZST-0002FASTVT: NEGATIVE
TZST-0002SLOWVT: NEGATIVE

## 2013-10-23 NOTE — Assessment & Plan Note (Signed)
No intercurrent ventricular tachycardia 

## 2013-10-23 NOTE — Assessment & Plan Note (Signed)
We will reprogram sensitivity from 0.3--0.6 mV. T wave rhythm  Is active

## 2013-10-23 NOTE — Progress Notes (Signed)
Patient Care Team: Jonita Albee, MD as PCP - General (Internal Medicine)   HPI  Alexandra Hood is a 42 y.o. female Seen in followup of ventricular tachycardia occurring in the setting of previously known pulmonary sarcoid and now MRI confirmed cardiac sarcoid. She is status post ICD implantation  She also has a history of persistent sinus tachycardia  htheadedness or syncope. She is walking daily. 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 Depression   . Pertussis   . Pseudotumor cerebri     .Marland KitchenMarland KitchenDr Hyacinth Meeker and Dr. Dione Booze - dxed Jan 2009, on diamox  - LP 01/24/2008 at [facility] - Normal ( pressure 14cm, gluc 68, Protein 23, Clear, colorless, RBC 3, WBC - too few to count)  - CT head non contrast done at [facility] 11/25/2007: "normal". Ddoes not report MRI, MRA, MRV being done - 2nd opinon by Dr. Pearlean Brownie - reportdly had MRI/MRV both normal; followup pending  . Lupus pernio     -skin bx confirmed at [facility] 05/07/2009 (personally reviewed) -looks improved 07/09/2009 -  . Pulmonary sarcoidosis     - Cleda Daub 07/25/2010 on pred 10/mtx 10 -> FEv1 1.84L/775%, FVC 2.25L - SPiro 09/06/2010 -> Fev1 2.14L/81%. Cut pred to 5mg  per day. Cont mtx - SPiro 01/20/2011 - Fev1 1.6L/61%, Ratio 74%. CXR Worse -> increased pred to 10mg  perday. Cont Mtx.    . Vitamin D deficiency     - Aug 2011: 22 and low -> commence Rx. - Jan 2012: 11  . Ventricular tachycardia     Past Surgical History  Procedure Laterality Date  . 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 Cardiac defibrillator placement      Medtronic    Current Outpatient Prescriptions  Medication Sig Dispense Refill  . acetaZOLAMIDE (DIAMOX) 250 MG tablet Take 2 tablets (500 mg total) by mouth 2 (two)  times daily.  120 tablet  1  . calcium gluconate 500 MG tablet Take 500 mg by mouth daily.        . Cholecalciferol (VITAMIN D3) 1000 UNITS CAPS Take 1 capsule by mouth daily.        . ferrous sulfate 325 (65 FE) MG tablet Take 325 mg by mouth daily with breakfast.        . folic acid (FOLVITE) 800 MCG tablet Take 800 mcg by mouth 2 (two) times daily.        Marland Kitchen ibuprofen (ADVIL,MOTRIN) 200 MG tablet Take 200 mg by mouth every 6 (six) hours as needed.        . meloxicam (MOBIC) 15 MG tablet Take 1 tablet (15 mg total) by mouth daily.  30 tablet  0  . methotrexate (RHEUMATREX) 2.5 MG tablet Take 4 tabs every Monday. Caution:Chemotherapy. Protect from light.  16 tablet  6  . methylPREDNISolone (MEDROL) 8 MG tablet Take 0.5 tablets (4 mg total) by mouth daily. Stop prednisone  30 tablet  5  . metoprolol succinate (TOPROL-XL) 25 MG 24 hr tablet Take 3 tablets every morning and 1 tablet every evening  120 tablet  11  . topiramate (TOPAMAX) 25 MG tablet Take 1 tablet (25 mg total) by mouth 2 (two) times daily.  180 tablet  3            No current facility-administered medications for this visit.    No Known Allergies  Review of Systems negative except from HPI and PMH  Physical Exam BP 118/72  Pulse 109  Ht 5\' 4"  (1.626 m)  Wt 220 lb (99.791 kg)  BMI 37.74 kg/m2  SpO2 99% Well developed and well nourished in no acute distress HENT normal E scleral and icterus clear Neck Supple JVP flat; carotids brisk and full Clear to ausculation  Regular rate and rhythm, no murmurs gallops or rub Soft with active bowel sounds No clubbing cyanosis none Edema Alert and oriented, grossly normal motor and sensory function Skin Warm and Dry   Assessment and  Plan

## 2013-10-23 NOTE — Assessment & Plan Note (Signed)
Persisted despite beta blocker therapy with which she is compliant. I anticipate Ivabradine once  is released

## 2013-10-23 NOTE — Assessment & Plan Note (Signed)
The patient's device was interrogated.  The information was reviewed. No changes were made in the programming.  ] The patient has T wave over sensing.

## 2013-10-23 NOTE — Patient Instructions (Addendum)
Your physician wants you to follow-up in: one year with Dr. Graciela Husbands.  You will receive a reminder letter in the mail two months in advance. If you don't receive a letter, please call our office to schedule the follow-up appointment.  Your physician recommends that you continue on your current medications as directed. Please refer to the Current Medication list given to you today.  Remote monitoring is used to monitor your ICD from home. This monitoring reduces the number of office visits required to check your device to one time per year. It allows [place] to keep an eye on the functioning of your device to ensure it is working properly. You are scheduled for a device check from home on 01/23/2014. You may send your transmission at any time that day. If you have a wireless device, the transmission will be sent automatically. After your physician reviews your transmission, you will receive a postcard with your next transmission date.

## 2013-10-23 NOTE — Assessment & Plan Note (Signed)
I wonder whether she has sleep apnea. She has a history of daytime somnolence. History of snoring. I wonder whether some of her headaches might not be related to this. Well as pulmonary to assess

## 2013-11-05 ENCOUNTER — Other Ambulatory Visit: Payer: Self-pay | Admitting: *Deleted

## 2013-11-07 ENCOUNTER — Other Ambulatory Visit: Payer: Self-pay | Admitting: *Deleted

## 2013-11-07 DIAGNOSIS — G932 Benign intracranial hypertension: Secondary | ICD-10-CM

## 2013-11-13 ENCOUNTER — Other Ambulatory Visit: Payer: Self-pay | Admitting: Internal Medicine

## 2013-11-13 MED ORDER — METHOTREXATE 2.5 MG PO TABS: ORAL_TABLET | ORAL | Status: DC

## 2013-11-13 MED ORDER — SULFAMETHOXAZOLE-TMP DS 800-160 MG PO TABS: ORAL_TABLET | ORAL | Status: DC

## 2013-12-14 ENCOUNTER — Ambulatory Visit (HOSPITAL_BASED_OUTPATIENT_CLINIC_OR_DEPARTMENT_OTHER): Payer: BC Managed Care – PPO | Attending: Internal Medicine

## 2013-12-14 VITALS — Ht 64.0 in | Wt 210.0 lb

## 2013-12-14 DIAGNOSIS — G4733 Obstructive sleep apnea (adult) (pediatric): Secondary | ICD-10-CM

## 2013-12-14 DIAGNOSIS — G4710 Hypersomnia, unspecified: Secondary | ICD-10-CM | POA: Insufficient documentation

## 2013-12-14 DIAGNOSIS — G932 Benign intracranial hypertension: Secondary | ICD-10-CM

## 2013-12-14 DIAGNOSIS — R000 Tachycardia, unspecified: Secondary | ICD-10-CM | POA: Insufficient documentation

## 2013-12-26 DIAGNOSIS — G932 Benign intracranial hypertension: Secondary | ICD-10-CM

## 2013-12-26 DIAGNOSIS — G4733 Obstructive sleep apnea (adult) (pediatric): Secondary | ICD-10-CM

## 2013-12-26 NOTE — Sleep Study (Signed)
   NAME: Alexandra Hood DATE OF BIRTH:  07-14-1971 MEDICAL RECORD NUMBER [id number]  LOCATION: [facility]  PHYSICIAN: [place] OF STUDY: 12/14/2013  SLEEP STUDY TYPE: Nocturnal Polysomnogram               REFERRING PHYSICIAN: Deboraha Sprang, MD  INDICATION FOR STUDY: Hypersomnia with sleep apnea  EPWORTH SLEEPINESS SCORE:  9 HEIGHT: 5\' 4"  (162.6 cm)  WEIGHT: 210 lb (95.255 kg)    Body mass index is 36.03 kg/(m^2).  NECK SIZE: 13 in.  SLEEP ARCHITECTURE: The patient had a total sleep time of 277 minutes, with adequate slow-wave sleep for age but decreased quantity of REM. Sleep onset latency was normal at 27 minutes, and REM onset was delayed at 229 minutes. Sleep efficiency was moderately reduced at 73%.  RESPIRATORY DATA: The patient was found to have one obstructive hypopnea and no apneas, giving her an AHI of only 0.2 events per hour. The patient slept in all body positions, and there was moderate snoring noted throughout. She did not meet split-night criteria secondary to her small numbers of events.  OXYGEN DATA: There was oxygen desaturation transiently as low as 88%.  CARDIAC DATA: Pace rhythm noted with tachycardia at times during the night.  Rare PVC as well.  MOVEMENT/PARASOMNIA: There were no significant periodic limb movements or abnormal behaviors seen.  IMPRESSION/ RECOMMENDATION:    1) small numbers of obstructive events which do not meet the AHI criteria for the obstructive sleep apnea syndrome.  2) Paced rhythm noted with tachycardia at times during the night.  Rare PVC.    [place], American Board of Sleep Medicine  ELECTRONICALLY SIGNED ON:  12/26/2013, 9:53 AM [place] PH: [phone]   FX: [phone] [place]

## 2014-01-13 ENCOUNTER — Ambulatory Visit: Payer: BC Managed Care – PPO | Admitting: Internal Medicine

## 2014-01-23 ENCOUNTER — Ambulatory Visit (INDEPENDENT_AMBULATORY_CARE_PROVIDER_SITE_OTHER): Payer: BC Managed Care – PPO | Admitting: *Deleted

## 2014-01-23 DIAGNOSIS — I498 Other specified cardiac arrhythmias: Secondary | ICD-10-CM

## 2014-01-23 DIAGNOSIS — I472 Ventricular tachycardia: Secondary | ICD-10-CM

## 2014-01-23 DIAGNOSIS — I4729 Other ventricular tachycardia: Secondary | ICD-10-CM

## 2014-01-23 DIAGNOSIS — R000 Tachycardia, unspecified: Secondary | ICD-10-CM

## 2014-01-23 LAB — MDC_IDC_ENUM_SESS_TYPE_REMOTE
Battery Voltage: 3.1 V
Brady Statistic AS VP Percent: 0.04 %
Brady Statistic AS VS Percent: 99.95 %
Brady Statistic RA Percent Paced: 0 %
Date Time Interrogation Session: 20150130051606
HIGH POWER IMPEDANCE MEASURED VALUE: 190 Ohm
HighPow Impedance: 266 Ohm
HighPow Impedance: 42 Ohm
HighPow Impedance: 55 Ohm
Lead Channel Impedance Value: 494 Ohm
Lead Channel Pacing Threshold Amplitude: 0.5 V
Lead Channel Pacing Threshold Amplitude: 0.75 V
Lead Channel Pacing Threshold Pulse Width: 0.4 ms
Lead Channel Sensing Intrinsic Amplitude: 1.75 mV
Lead Channel Sensing Intrinsic Amplitude: 2.25 mV
Lead Channel Setting Pacing Amplitude: 2.5 V
Lead Channel Setting Pacing Pulse Width: 0.4 ms
Lead Channel Setting Sensing Sensitivity: 0.45 mV
MDC IDC MSMT LEADCHNL RV IMPEDANCE VALUE: 380 Ohm
MDC IDC MSMT LEADCHNL RV PACING THRESHOLD PULSEWIDTH: 0.4 ms
MDC IDC SET LEADCHNL RA PACING AMPLITUDE: 2 V
MDC IDC SET ZONE DETECTION INTERVAL: 350 ms
MDC IDC STAT BRADY AP VP PERCENT: 0 %
MDC IDC STAT BRADY AP VS PERCENT: 0 %
MDC IDC STAT BRADY RV PERCENT PACED: 0.04 %
Zone Setting Detection Interval: 300 ms
Zone Setting Detection Interval: 350 ms
Zone Setting Detection Interval: 360 ms

## 2014-02-09 ENCOUNTER — Encounter: Payer: Self-pay | Admitting: Internal Medicine

## 2014-02-18 ENCOUNTER — Encounter: Payer: Self-pay | Admitting: *Deleted

## 2014-02-18 ENCOUNTER — Telehealth: Payer: Self-pay | Admitting: Internal Medicine

## 2014-02-18 NOTE — Telephone Encounter (Signed)
She did not come for her 3 mo followup a month ago. [facility]ase set  up

## 2014-02-18 NOTE — Telephone Encounter (Signed)
appt set for 02-26-14.[place] Bing, CMA

## 2014-02-26 ENCOUNTER — Encounter: Payer: Self-pay | Admitting: Internal Medicine

## 2014-02-26 ENCOUNTER — Ambulatory Visit (INDEPENDENT_AMBULATORY_CARE_PROVIDER_SITE_OTHER): Payer: BC Managed Care – PPO | Admitting: Internal Medicine

## 2014-02-26 VITALS — BP 128/86 | HR 94 | Ht 64.0 in | Wt 227.4 lb

## 2014-02-26 DIAGNOSIS — E559 Vitamin D deficiency, unspecified: Secondary | ICD-10-CM

## 2014-02-26 DIAGNOSIS — D869 Sarcoidosis, unspecified: Secondary | ICD-10-CM

## 2014-02-26 DIAGNOSIS — D863 Sarcoidosis of skin: Secondary | ICD-10-CM

## 2014-02-26 NOTE — Progress Notes (Signed)
Subjective:    Patient ID: Alexandra Hood, female    DOB: 1971/05/03, 43 y.o.   MRN: [id number]  HPI  1. Stage 2 Pulmonary sarcoid   -pfts worsen when pred tapered  - s/p 2nd opinion 2010 with Dr Elie Confer at [facility] and 2012 at [facility] on steroids (medrol at her request since Jan 201) and methotrexate and bactrim   2. 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 Lupus Pernio (nasal bridge)  - absent on exam March 2015 5. VIt D Deficiency.  - normalized Oct 2014 and advised to take normal MVT oct 2014 6. Obesity and Pseudotumor cerebrii Body mass index is 23.43 kg/(m^2). on 10/14/2013 7. Pre-diabetes  HgbA1c 6.5 on 09/06/10    OV 07/18/2011: Followup for above. Last seen May Jan 2012. At that time, stilli  Tachycardic. Dr Caryl Comes increased lopressor in June 2012 and now normocardic. Now continuing medrol 8mg  per day instead of prednisone but headaches of pseudoetumor cerebrii persists on and off 2 tiimes per week at mild levels. Appointments with Dr. Katy Fitch and [place] at [facility] still pending. Lupus pernio is resolved.  She insists she is  compliant with medrol 8mg  daily and 10mg  methtotrexate once a week and bactrim prophylaxis. Denies dyspnea or cough but admits to weight gain. No other issues. Last cXr was in marc 9. 2012 and showed stable pulmnary sarcoid. Last LFTs Jan 2012 and chemistries March 2012 - normal.   REC Continue your current medications. If you want your medrol dose cut down, call us  Please see Dr Sabra Heck for headaches  Return in 4 months - at that time do spirometry, lft and vitamin d levels   OV 12/06/2011 Follow up for above. Last seen July 2012. Is a 6 month followup. Feels great. Looking forward to holidays.. No active complaints. Tolerating methotrexate, Medrol and Bactrim for sarcoidosis quite well. Her ICD has not fired and she's not having tachycardia from  a cardiac sarcoid. She is due to see Dr. Caryl Comes soon. The keloid from  her ICD is persistent for is not bothering her. Her lupus pernio in her nose is still in remission.  Her headaches from pseudotumor cerebri continue on off but they are better. She feels that Medrol works better than prednisone in terms of her mood and weight. She has not had blood tests since spring 2012. She says she is compliant with her medications including vitamin D for vitamin D deficiency.  Spirometry today shows Fev1 2.02L/79%. Ratio - normal.  This is essentially baseline for her  REC #Sarcoid Your lung function is 2L/79% and nearly normal Glad you are feeling well Please cut down medrol dose to 4mg  per day Continue  Methotrexate at 10mg  per week; depending on your test results we might cut this down further Continue bactrim as before Have blood test today for cbcd, lft, bmet - will call you with results #VIt D Def - continue vit d replacement - recheck level today #followup  - 5 months - spirometry at followup   OV march 2013  followup Sarcoid, Vit D Def, obesity  Getting over cold - nephews and parents sick. Got sick end of 4-5 days ago. Getting better. HAs related cough due to URI but is resolving. From sarcoid: asymptomatic. Weight continues to be an issue. She does not want to weigh herself duue to embarassment. Currently on medrol 4mg   daily (reduced dose in dec 2012) and methotreaxate 10mg  per week and bactrim 3 per week. Spirometry today continues to hold well despiute cutting down prednisone dose and in fact is best ever: Fev1 2.11/82%, FVC 2.47L/81%, Ratip 85 (101%). Of note, reviewing her labs over the past year I find that she has thrombocytosis > 500 count for over a year. Also, she has not had her HgbA1c checked since 2011 and Vit D checked in a while.   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 She has no specific reason for not following up. She says in the interim she is doing extremely well. Essentially asymptomatic. Lupus pernio has resolved. Denies cough, shortness of breath, chest tightness and wheezing. She says she's been compliant with her methotrexate at 7.5 mg once a week and methylprednisolone 4 milligrams daily. Not taking her Bactrim. Lab work done to first 2014 by primary care physician reveals normal creatinine normal liver function test and normal CBC with her baseline unchanged idiopathic thrombocytosis. Glucose on that day was 88 mg percent. She's not had spirometry her chest x-ray long time approximately 18 months.  Spirometry today's FEV1 2.1 L/84%. FVC 2.5 L 87%. Ratio of 82/98%. And this is her baseline best. CXR today shows just stranding and comaared to 2011 and 2012 cxr, the sarcoidosis has now burnt out  Of note, denies AICD firing as well  Regarding  vitamin D deficiency: She continues to take vitamin D supplements but this level has not been checked in a long time  Regarding obesity: Her weight is unchanged she continues to remain morbidly obese  #Pulmonary and lupus pernio sarcoidosis - apperas to have resolved based on cxr and spirometry - Cut your methyl prednisone down to 2 mg daily - Cut methotrexate down to 5 mg once a week   #Cardiac sacoid  - will re-refer cards to have interrogation - if no V Tach, will work on [employer] steroids and methotrexate permanemntt  #Vitamin D deficiency - Recheck levels today and we will call with the results  #Followup 3 months with spirometry  OV 02/26/2014 Chief Complaint  Patient presents with  . Follow-up    Pt denies nay complaints at this time.    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 She gives a variable story. I also told her to cut her methyl prednisone down to 2 mg daily but she is taking this at 4 mg daily. She is only intermittently compliant with Bactrim for PCP prophylaxis. At any rate her lupus pernio is resolved. She's asymptomatic from a respiratory standpoint. She saw cardiology in October 2014 and her ICD was interrogated and was clear of any V. tach. She did have a sleep study per her history by cardiology and this was normal. Today his spirometry is essentially close to baseline with an FEV1 of 1.85 L or 70%.  The technician doing the test was new to [place] and therefore might not be a reproducible accurate result.   Vitamin D deficiency: October 2014 vitamin  D level was normal. She is now continuing vitamin D at 1000 units daily. I did tell her to take normal multivitamin but am not sure that she is doing that. At next visit we will check her vitamin D levels  Obesity: She continues to be obese. She's ready to stop the steroids and therefore she can lose weight.   Review of Systems  Constitutional: Negative for fever and unexpected weight change.  HENT: Negative for congestion, dental problem, ear pain, nosebleeds, postnasal drip, rhinorrhea, sinus pressure, sneezing, sore throat and trouble swallowing.   Eyes: Negative for redness and itching.  Respiratory: Negative for cough, chest tightness, shortness of breath and wheezing.   Cardiovascular: Negative for palpitations and leg swelling.  Gastrointestinal: Negative for nausea and vomiting.  Genitourinary: Negative for dysuria.  Musculoskeletal: Negative for joint swelling.  Skin: Negative for rash.  Neurological: Negative for headaches.  Hematological: Does not bruise/bleed easily.  Psychiatric/Behavioral: Negative for dysphoric mood. The patient is not  nervous/anxious.    Scheduled Meds: Continuous Infusions: PRN Meds:.       Objective:   Physical Exam  Vitals reviewed. Constitutional: She is oriented to person, place, and time. She appears well-developed and well-nourished. No distress.  HENT:  Head: Normocephalic and atraumatic.  Right Ear: External ear normal.  Left Ear: External ear normal.  Mouth/Throat: Oropharynx is clear and moist. No oropharyngeal exudate.  Eyes: Conjunctivae and EOM are normal. Pupils are equal, round, and reactive to light. Right eye exhibits no discharge. Left eye exhibits no discharge. No scleral icterus.  Neck: Normal range of motion. Neck supple. No JVD present. No tracheal deviation present. No thyromegaly present.  Cardiovascular: Normal rate, regular rhythm, normal heart sounds and intact distal pulses.  Exam reveals no gallop and no friction rub.   No murmur heard. Pulmonary/Chest: Effort normal and breath sounds normal. No respiratory distress. She has no wheezes. She has no rales. She exhibits no tenderness.  Abdominal: Soft. Bowel sounds are normal. She exhibits no distension and no mass. There is no tenderness. There is no rebound and no guarding.  Musculoskeletal: Normal range of motion. She exhibits no edema and no tenderness.  Lymphadenopathy:    She has no cervical adenopathy.  Neurological: She is alert and oriented to person, place, and time. She has normal reflexes. No cranial nerve deficit. She exhibits normal muscle tone. Coordination normal.  Skin: Skin is warm and dry. No rash noted. She is not diaphoretic. No erythema. No pallor.  Psychiatric: She has a normal mood and affect. Her behavior is normal. Judgment and thought content normal.          Assessment & Plan:

## 2014-02-26 NOTE — Patient Instructions (Addendum)
#  Pulmonary and lupus pernio sarcoidosis  And cardiac sarcoid - - apperas be in remission - Cut your methyl prednisone down to 2 mg daily -STop methotrexate - stop bactrim  #Vitamin D deficiency - Check levels at followup  #Followup 3 months with spirometry +/- CXR If doing well, will continue with weaning process of steroids to off over months/weeks

## 2014-02-28 NOTE — Assessment & Plan Note (Signed)
#  Pulmonary and lupus pernio sarcoidosis  And cardiac sarcoid - - apperas be in remission - Cut your methyl prednisone down to 2 mg daily -STop methotrexate - stop bactrim    #Followup 3 months with spirometry +/- CXR If doing well, will continue with weaning process of steroids to off over months/weeks

## 2014-02-28 NOTE — Assessment & Plan Note (Signed)
Vit D def  Not clear if she is taking regular dose or mega dose. Last OV levels were normal. Will check levels next visit

## 2014-03-09 ENCOUNTER — Encounter: Payer: Self-pay | Admitting: Internal Medicine

## 2014-04-28 ENCOUNTER — Other Ambulatory Visit: Payer: Self-pay | Admitting: Internal Medicine

## 2014-04-28 ENCOUNTER — Ambulatory Visit (INDEPENDENT_AMBULATORY_CARE_PROVIDER_SITE_OTHER): Payer: BC Managed Care – PPO | Admitting: *Deleted

## 2014-04-28 DIAGNOSIS — I472 Ventricular tachycardia: Secondary | ICD-10-CM

## 2014-04-28 DIAGNOSIS — I4729 Other ventricular tachycardia: Secondary | ICD-10-CM

## 2014-05-02 LAB — MDC_IDC_ENUM_SESS_TYPE_REMOTE
Battery Voltage: 3.08 V
Brady Statistic AP VP Percent: 0 %
Brady Statistic AP VS Percent: 0.01 %
Brady Statistic RA Percent Paced: 0.01 %
Brady Statistic RV Percent Paced: 0.04 %
Date Time Interrogation Session: 20150505084228
HIGH POWER IMPEDANCE MEASURED VALUE: 190 Ohm
HIGH POWER IMPEDANCE MEASURED VALUE: 304 Ohm
HighPow Impedance: 43 Ohm
HighPow Impedance: 51 Ohm
Lead Channel Impedance Value: 342 Ohm
Lead Channel Pacing Threshold Amplitude: 0.5 V
Lead Channel Pacing Threshold Pulse Width: 0.4 ms
Lead Channel Pacing Threshold Pulse Width: 0.4 ms
Lead Channel Sensing Intrinsic Amplitude: 1.5 mV
Lead Channel Sensing Intrinsic Amplitude: 1.5 mV
Lead Channel Sensing Intrinsic Amplitude: 2.5 mV
Lead Channel Sensing Intrinsic Amplitude: 2.5 mV
Lead Channel Setting Pacing Pulse Width: 0.4 ms
MDC IDC MSMT LEADCHNL RA IMPEDANCE VALUE: 494 Ohm
MDC IDC MSMT LEADCHNL RV PACING THRESHOLD AMPLITUDE: 0.625 V
MDC IDC SET LEADCHNL RA PACING AMPLITUDE: 2 V
MDC IDC SET LEADCHNL RV PACING AMPLITUDE: 2.5 V
MDC IDC SET LEADCHNL RV SENSING SENSITIVITY: 0.45 mV
MDC IDC SET ZONE DETECTION INTERVAL: 300 ms
MDC IDC SET ZONE DETECTION INTERVAL: 350 ms
MDC IDC SET ZONE DETECTION INTERVAL: 350 ms
MDC IDC STAT BRADY AS VP PERCENT: 0.04 %
MDC IDC STAT BRADY AS VS PERCENT: 99.95 %
Zone Setting Detection Interval: 360 ms

## 2014-05-08 NOTE — Progress Notes (Signed)
Remote ICD transmission.   

## 2014-05-14 ENCOUNTER — Encounter: Payer: Self-pay | Admitting: Cardiology

## 2014-05-18 ENCOUNTER — Ambulatory Visit (INDEPENDENT_AMBULATORY_CARE_PROVIDER_SITE_OTHER): Payer: BC Managed Care – PPO | Admitting: Internal Medicine

## 2014-05-18 VITALS — BP 114/78 | HR 98 | Temp 98.2°F | Resp 16 | Ht 66.0 in | Wt 224.0 lb

## 2014-05-18 DIAGNOSIS — R350 Frequency of micturition: Secondary | ICD-10-CM

## 2014-05-18 LAB — POCT UA - MICROSCOPIC ONLY
CASTS, UR, LPF, POC: NEGATIVE
Crystals, Ur, HPF, POC: NEGATIVE
MUCUS UA: NEGATIVE
RBC, urine, microscopic: NEGATIVE
Yeast, UA: NEGATIVE

## 2014-05-18 LAB — POCT URINALYSIS DIPSTICK
Bilirubin, UA: NEGATIVE
GLUCOSE UA: NEGATIVE
Ketones, UA: NEGATIVE
Leukocytes, UA: NEGATIVE
NITRITE UA: NEGATIVE
Protein, UA: NEGATIVE
Spec Grav, UA: <=1.005
UROBILINOGEN UA: 0.2
pH, UA: 6.5

## 2014-05-18 MED ORDER — SULFAMETHOXAZOLE-TMP DS 800-160 MG PO TABS: 1.0000 | ORAL_TABLET | Freq: Two times a day (BID) | ORAL | Status: DC

## 2014-05-18 NOTE — Progress Notes (Signed)
° °  Subjective:  This chart was scribed for Dr. Tami Lin, MD by Ludger Nutting, ED Scribe. This patient was seen in room 4 and the patient's care was started 4:23 PM.    Patient ID: Alexandra Hood, female    DOB: 1971/02/12, 43 y.o.   MRN: [id number]  HPI  HPI Comments: Alexandra Hood is a 43 y.o. female who presents to [facility] complaining of gradual onset, constant right flank pain and urinary frequency with associated pressure that began 2 days ago. She also reports having a fever 2 days ago and nausea yesterday. She reports similar symptoms in the past when she was diagnosed with a UTI. She states her last UTI was over 6 months ago. Has 3 UTIs per year.  Review of Systems  Constitutional: Positive for fever.  Gastrointestinal: Positive for nausea.  Genitourinary: Positive for dysuria, frequency and flank pain.   Past medical history significant for multiple sarcoid-related problems and long-term treatment with steroids     Objective:   Physical Exam  Nursing note and vitals reviewed. Constitutional: She is oriented to person, place, and time. She appears well-developed and well-nourished. No distress.  HENT:  Head: Normocephalic and atraumatic.  Cardiovascular: Normal rate.   Pulmonary/Chest: Effort normal.  Abdominal: Soft. Bowel sounds are normal. She exhibits no distension. There is no tenderness.  Genitourinary:  Right CVA tenderness to percussion.   Musculoskeletal:  Negative straight leg raise Right flank pain is not increased with flexion or rotation  Neurological: She is alert and oriented to person, place, and time.  Skin: Skin is warm and dry.  Psychiatric: She has a normal mood and affect.   Results for orders placed in visit on 05/18/14  POCT UA - MICROSCOPIC ONLY      Result Value Ref Range   WBC, Ur, HPF, POC 0-3     RBC, urine, microscopic neg     Bacteria, U Microscopic trace     Mucus, UA neg     Epithelial cells, urine per micros 2-6     Crystals, Ur, HPF, POC neg     Casts, Ur, LPF, POC neg     Yeast, UA neg    POCT URINALYSIS DIPSTICK      Result Value Ref Range   Color, UA yellow     Clarity, UA clear     Glucose, UA neg     Bilirubin, UA neg     Ketones, UA neg     Spec Grav, UA <=1.005     Blood, UA trace     pH, UA 6.5     Protein, UA neg     Urobilinogen, UA 0.2     Nitrite, UA neg     Leukocytes, UA Negative            Assessment & Plan:   I have completed the patient encounter in its entirety as documented by the scribe, with editing by me where necessary. Robert P. Laney Pastor, M.D. Increased frequency of urination - Plan: POCT UA - Microscopic Only, POCT urinalysis dipstick, Urine culture  Culture urine and start treatment with antibiotics Meds ordered this encounter  Medications   sulfamethoxazole-trimethoprim (BACTRIM DS) 800-160 MG per tablet    Sig: Take 1 tablet by mouth 2 (two) times daily.    Dispense:  10 tablet    Refill:  0

## 2014-05-20 LAB — URINE CULTURE
Colony Count: NO GROWTH
ORGANISM ID, BACTERIA: NO GROWTH

## 2014-05-21 ENCOUNTER — Ambulatory Visit (INDEPENDENT_AMBULATORY_CARE_PROVIDER_SITE_OTHER): Payer: BC Managed Care – PPO | Admitting: Family Medicine

## 2014-05-21 ENCOUNTER — Ambulatory Visit: Payer: BC Managed Care – PPO

## 2014-05-21 VITALS — BP 126/78 | HR 95 | Temp 98.3°F | Resp 16 | Ht 65.5 in | Wt 222.0 lb

## 2014-05-21 DIAGNOSIS — R1011 Right upper quadrant pain: Secondary | ICD-10-CM

## 2014-05-21 DIAGNOSIS — K5900 Constipation, unspecified: Secondary | ICD-10-CM

## 2014-05-21 LAB — POCT UA - MICROSCOPIC ONLY
CASTS, UR, LPF, POC: NEGATIVE
Crystals, Ur, HPF, POC: NEGATIVE
MUCUS UA: NEGATIVE
WBC, Ur, HPF, POC: NEGATIVE
Yeast, UA: NEGATIVE

## 2014-05-21 LAB — POCT URINALYSIS DIPSTICK
BILIRUBIN UA: NEGATIVE
Glucose, UA: NEGATIVE
KETONES UA: NEGATIVE
Leukocytes, UA: NEGATIVE
NITRITE UA: NEGATIVE
PH UA: 6.5
Protein, UA: NEGATIVE
Spec Grav, UA: 1.01
Urobilinogen, UA: 0.2

## 2014-05-21 LAB — POCT CBC
Granulocyte percent: 68.2 %G (ref 37–80)
HEMATOCRIT: 42.6 % (ref 37.7–47.9)
HEMOGLOBIN: 13.4 g/dL (ref 12.2–16.2)
Lymph, poc: 2.6 (ref 0.6–3.4)
MCH, POC: 27.9 pg (ref 27–31.2)
MCHC: 31.5 g/dL — AB (ref 31.8–35.4)
MCV: 88.6 fL (ref 80–97)
MID (cbc): 0.5 (ref 0–0.9)
MPV: 9.1 fL (ref 0–99.8)
POC GRANULOCYTE: 6.5 (ref 2–6.9)
POC LYMPH PERCENT: 27.1 %L (ref 10–50)
POC MID %: 4.7 %M (ref 0–12)
Platelet Count, POC: 602 10*3/uL — AB (ref 142–424)
RBC: 4.81 M/uL (ref 4.04–5.48)
RDW, POC: 15.3 %
WBC: 9.6 10*3/uL (ref 4.6–10.2)

## 2014-05-21 MED ORDER — DOCUSATE SODIUM 100 MG PO CAPS: 200.0000 mg | ORAL_CAPSULE | Freq: Two times a day (BID) | ORAL | Status: DC

## 2014-05-21 MED ORDER — POLYETHYLENE GLYCOL 3350 17 GM/SCOOP PO POWD: 17.0000 g | Freq: Every day | ORAL | Status: DC | PRN

## 2014-05-21 NOTE — Patient Instructions (Addendum)
Push fluids - We are going to try and  Get you not constipated over the next 24 hours - we will use Miralax and Colace for a stool softner --   Go home and take Colace 4 pills and Miralax 6 capfuls over 2 hours - mix with beverage of choice - increase water intake

## 2014-05-21 NOTE — Progress Notes (Signed)
Subjective:    Patient ID: Alexandra Hood, female    DOB: 12/31/1970, 43 y.o.   MRN: [id number]  HPI  Pt presents to clinic for a recheck. She feels like her pain has gotten worse - it is still on the right side in her back and radiates around to her right side more in her RUQ.  It is there constantly and dull and then she has episodes of stronger pain.  She has noticed nothing that makes it better or worse. She is no longer having urinary symptoms. She had some nausea today with 1 episode of emesis. She has been eating normal.  She has not had fever since the day she started having her symptoms.  For the last week, she is having some problems with harder stools that are less frequent - she has never had problems with constipation in the past.  She has had oophorectomy bilaterally but still has her uterus.  She had ovarian cysts and this pain is similar to that.  She still has her gallbladder and appendix.  She has had pulled muscles in the past and this feels different than that.  She started 5 days ago with urinary frequency and she is taking abx for a UTI for 3 days without problems.  Review of Systems     Objective:   Physical Exam  Vitals reviewed. Constitutional: She is oriented to person, place, and time. She appears well-developed and well-nourished.  HENT:  Head: Normocephalic and atraumatic.  Right Ear: External ear normal.  Left Ear: External ear normal.  Cardiovascular: Normal rate, regular rhythm and normal heart sounds.   No murmur heard. Pulmonary/Chest: Breath sounds normal. She has no wheezes.  Abdominal: Soft. Bowel sounds are normal. She exhibits no mass. There is no hepatosplenomegaly. There is tenderness (most of her tenderness is RUQ and right middle abd, the worst is RUQ) in the right upper quadrant, right lower quadrant and epigastric area. There is positive Murphy's sign (mild). There is no rebound, no guarding, no CVA tenderness (when her back is percussed  her RUQ and right middle abd hurt) and no tenderness at McBurney's point.  Neurological: She is alert and oriented to person, place, and time.  Skin: Skin is warm and dry.  Psychiatric: She has a normal mood and affect. Her behavior is normal. Judgment and thought content normal.    Results for orders placed in visit on 05/21/14  POCT URINALYSIS DIPSTICK      Result Value Ref Range   Color, UA lt yellow     Clarity, UA clear     Glucose, UA neg     Bilirubin, UA neg     Ketones, UA neg     Spec Grav, UA 1.010     Blood, UA trace-intact     pH, UA 6.5     Protein, UA neg     Urobilinogen, UA 0.2     Nitrite, UA neg     Leukocytes, UA Negative    POCT UA - MICROSCOPIC ONLY      Result Value Ref Range   WBC, Ur, HPF, POC neg     RBC, urine, microscopic 0-1     Bacteria, U Microscopic trace     Mucus, UA neg     Epithelial cells, urine per micros 0-1     Crystals, Ur, HPF, POC neg     Casts, Ur, LPF, POC neg     Yeast, UA neg    POCT CBC  Result Value Ref Range   WBC 9.6  4.6 - 10.2 K/uL   Lymph, poc 2.6  0.6 - 3.4   POC LYMPH PERCENT 27.1  10 - 50 %L   MID (cbc) 0.5  0 - 0.9   POC MID % 4.7  0 - 12 %M   POC Granulocyte 6.5  2 - 6.9   Granulocyte percent 68.2  37 - 80 %G   RBC 4.81  4.04 - 5.48 M/uL   Hemoglobin 13.4  12.2 - 16.2 g/dL   HCT, POC 42.6  37.7 - 47.9 %   MCV 88.6  80 - 97 fL   MCH, POC 27.9  27 - 31.2 pg   MCHC 31.5 (*) 31.8 - 35.4 g/dL   RDW, POC 15.3     Platelet Count, POC 602 (*) 142 - 424 K/uL   MPV 9.1  0 - 99.8 fL     [facility] reading (PRIMARY) by  Dr. Jodelle Red.  Increased stool burden but otherwise normal.      Assessment & Plan:  RUQ pain - Plan: POCT urinalysis dipstick, POCT UA - Microscopic Only, POCT CBC, COMPLETE METABOLIC PANEL WITH GFR, DG Abd Acute W/Chest, Lipase, polyethylene glycol powder (GLYCOLAX/MIRALAX) powder  Unspecified constipation - Plan: docusate sodium (COLACE) 100 MG capsule  Pt has no fever, she has been eating  normal and her labs show a normal urine, normal CBC and normal abd xrays but a very tender abdominal exam.  I suspect that patient's pain may be from her constipation and we will treat accordingly.  Due to her RUQ pain and mild Murphy's sign and length of time of her pain we will recheck tomorrow and if she is no better we will consider a CT scan of her abd/pelvis.  Her questions were answered and she agrees with the above.  D/w Dr Lorelei Pont.   Windell Hummingbird PA-C  [facility] 05/21/2014 8:54 PM

## 2014-05-22 ENCOUNTER — Emergency Department (HOSPITAL_COMMUNITY): Payer: BC Managed Care – PPO

## 2014-05-22 ENCOUNTER — Encounter (HOSPITAL_COMMUNITY): Payer: Self-pay | Admitting: Emergency Medicine

## 2014-05-22 ENCOUNTER — Emergency Department (HOSPITAL_COMMUNITY)
Admission: EM | Admit: 2014-05-22 | Discharge: 2014-05-22 | Disposition: A | Discharge status: Home / Self Care | Payer: BC Managed Care – PPO | Attending: Emergency Medicine | Admitting: Emergency Medicine

## 2014-05-22 ENCOUNTER — Telehealth: Payer: Self-pay

## 2014-05-22 ENCOUNTER — Encounter: Payer: Self-pay | Admitting: Cardiology

## 2014-05-22 ENCOUNTER — Ambulatory Visit (INDEPENDENT_AMBULATORY_CARE_PROVIDER_SITE_OTHER): Payer: BC Managed Care – PPO | Admitting: Family Medicine

## 2014-05-22 ENCOUNTER — Ambulatory Visit (HOSPITAL_COMMUNITY)
Admission: RE | Admit: 2014-05-22 | Discharge: 2014-05-22 | Disposition: A | Discharge status: Home / Self Care | Payer: BC Managed Care – PPO | Source: Ambulatory Visit | Attending: Family Medicine | Admitting: Family Medicine

## 2014-05-22 VITALS — BP 126/74 | HR 99 | Temp 98.2°F | Resp 18 | Ht 65.5 in | Wt 219.0 lb

## 2014-05-22 DIAGNOSIS — G8929 Other chronic pain: Secondary | ICD-10-CM

## 2014-05-22 DIAGNOSIS — H53149 Visual discomfort, unspecified: Secondary | ICD-10-CM | POA: Insufficient documentation

## 2014-05-22 DIAGNOSIS — J159 Unspecified bacterial pneumonia: Secondary | ICD-10-CM | POA: Insufficient documentation

## 2014-05-22 DIAGNOSIS — Z87891 Personal history of nicotine dependence: Secondary | ICD-10-CM | POA: Insufficient documentation

## 2014-05-22 DIAGNOSIS — F329 Major depressive disorder, single episode, unspecified: Secondary | ICD-10-CM | POA: Insufficient documentation

## 2014-05-22 DIAGNOSIS — R112 Nausea with vomiting, unspecified: Secondary | ICD-10-CM

## 2014-05-22 DIAGNOSIS — R519 Headache, unspecified: Secondary | ICD-10-CM

## 2014-05-22 DIAGNOSIS — R932 Abnormal findings on diagnostic imaging of liver and biliary tract: Secondary | ICD-10-CM

## 2014-05-22 DIAGNOSIS — Z79899 Other long term (current) drug therapy: Secondary | ICD-10-CM | POA: Insufficient documentation

## 2014-05-22 DIAGNOSIS — R1011 Right upper quadrant pain: Secondary | ICD-10-CM | POA: Insufficient documentation

## 2014-05-22 DIAGNOSIS — F3289 Other specified depressive episodes: Secondary | ICD-10-CM | POA: Insufficient documentation

## 2014-05-22 DIAGNOSIS — J189 Pneumonia, unspecified organism: Secondary | ICD-10-CM

## 2014-05-22 DIAGNOSIS — IMO0002 Reserved for concepts with insufficient information to code with codable children: Secondary | ICD-10-CM | POA: Insufficient documentation

## 2014-05-22 DIAGNOSIS — R51 Headache: Secondary | ICD-10-CM | POA: Insufficient documentation

## 2014-05-22 DIAGNOSIS — Z8619 Personal history of other infectious and parasitic diseases: Secondary | ICD-10-CM | POA: Insufficient documentation

## 2014-05-22 DIAGNOSIS — Z3202 Encounter for pregnancy test, result negative: Secondary | ICD-10-CM | POA: Insufficient documentation

## 2014-05-22 LAB — COMPLETE METABOLIC PANEL WITH GFR
ALBUMIN: 4.1 g/dL (ref 3.5–5.2)
ALT: 25 U/L (ref 0–35)
AST: 22 U/L (ref 0–37)
Alkaline Phosphatase: 116 U/L (ref 39–117)
BUN: 7 mg/dL (ref 6–23)
CO2: 28 meq/L (ref 19–32)
Calcium: 9.8 mg/dL (ref 8.4–10.5)
Chloride: 102 mEq/L (ref 96–112)
Creat: 0.8 mg/dL (ref 0.50–1.10)
GFR, Est African American: >89 mL/min
GLUCOSE: 83 mg/dL (ref 70–99)
Potassium: 4.7 mEq/L (ref 3.5–5.3)
Sodium: 140 mEq/L (ref 135–145)
Total Bilirubin: 0.2 mg/dL (ref 0.2–1.2)
Total Protein: 7.5 g/dL (ref 6.0–8.3)

## 2014-05-22 LAB — LIPASE: Lipase: 32 U/L (ref 0–75)

## 2014-05-22 LAB — POC URINE PREG, ED: Preg Test, Ur: NEGATIVE

## 2014-05-22 MED ORDER — AZITHROMYCIN 250 MG PO TABS
500.0000 mg | ORAL_TABLET | Freq: Once | ORAL | Status: AC
  Administered 2014-05-22: 500 mg via ORAL
  Filled 2014-05-22: qty 2

## 2014-05-22 MED ORDER — ONDANSETRON 8 MG PO TBDP
8.0000 mg | ORAL_TABLET | Freq: Once | ORAL | Status: AC
  Administered 2014-05-22: 8 mg via ORAL
  Filled 2014-05-22: qty 1

## 2014-05-22 MED ORDER — DIPHENHYDRAMINE HCL 50 MG/ML IJ SOLN
25.0000 mg | Freq: Once | INTRAMUSCULAR | Status: AC
  Administered 2014-05-22: 25 mg via INTRAVENOUS
  Filled 2014-05-22: qty 1

## 2014-05-22 MED ORDER — DEXTROSE 5 % IV SOLN
1.0000 g | Freq: Once | INTRAVENOUS | Status: AC
  Administered 2014-05-22: 1 g via INTRAVENOUS
  Filled 2014-05-22: qty 10

## 2014-05-22 MED ORDER — AZITHROMYCIN 250 MG PO TABS: 250.0000 mg | ORAL_TABLET | Freq: Every day | ORAL | Status: DC

## 2014-05-22 MED ORDER — METOCLOPRAMIDE HCL 5 MG/ML IJ SOLN
10.0000 mg | Freq: Once | INTRAMUSCULAR | Status: AC
  Administered 2014-05-22: 10 mg via INTRAVENOUS
  Filled 2014-05-22: qty 2

## 2014-05-22 MED ORDER — DEXAMETHASONE SODIUM PHOSPHATE 10 MG/ML IJ SOLN
10.0000 mg | Freq: Once | INTRAMUSCULAR | Status: AC
  Administered 2014-05-22: 10 mg via INTRAVENOUS
  Filled 2014-05-22: qty 1

## 2014-05-22 NOTE — Patient Instructions (Addendum)
Go to [facility] and register at radiology, on first floor, for RUQ abdominal U/S    Abdominal Pain, Adult Many things can cause abdominal pain. Usually, abdominal pain is not caused by a disease and will improve without treatment. It can often be observed and treated at home. Your health care provider will do a physical exam and possibly order blood tests and X-rays to help determine the seriousness of your pain. However, in many cases, more time must pass before a clear cause of the pain can be found. Before that point, your health care provider may not know if you need more testing or further treatment. HOME CARE INSTRUCTIONS  Monitor your abdominal pain for any changes. The following actions may help to alleviate any discomfort you are experiencing:  Only take over-the-counter or prescription medicines as directed by your health care provider.  Do not take laxatives unless directed to do so by your health care provider.  Try a clear liquid diet (broth, tea, or water) as directed by your health care provider. Slowly move to a bland diet as tolerated. SEEK MEDICAL CARE IF:  You have unexplained abdominal pain.  You have abdominal pain associated with nausea or diarrhea.  You have pain when you urinate or have a bowel movement.  You experience abdominal pain that wakes you in the night.  You have abdominal pain that is worsened or improved by eating food.  You have abdominal pain that is worsened with eating fatty foods. SEEK IMMEDIATE MEDICAL CARE IF:   Your pain does not go away within 2 hours.  You have a fever.  You keep throwing up (vomiting).  Your pain is felt only in portions of the abdomen, such as the right side or the left lower portion of the abdomen.  You pass bloody or black tarry stools. MAKE SURE YOU:  Understand these instructions.   Will watch your condition.   Will get help right away if you are not doing well or get worse.  Document  Released: 09/20/2005 Document Revised: 10/01/2013 Document Reviewed: 08/20/2013 [facility] Patient Information 2014 [place].

## 2014-05-22 NOTE — Progress Notes (Signed)
addnd 5/29: saw that she had called.  Called her back- she reports RUQ pain.  Asked her to come in right away for a recheck and not to eat or drink anything.  Explained that although a CT may be what she needs, if her sx are just RUQ we can probably do an ultrasound instead which will mean less cost and radiation.  She understands and will come in to be seen.

## 2014-05-22 NOTE — Discharge Instructions (Signed)
Headaches, Frequently Asked Questions MIGRAINE HEADACHES Q: What is migraine? What causes it? How can I treat it? A: Generally, migraine headaches begin as a dull ache. Then they develop into a constant, throbbing, and pulsating pain. You may experience pain at the temples. You may experience pain at the front or back of one or both sides of the head. The pain is usually accompanied by a combination of:  Nausea.  Vomiting.  Sensitivity to light and noise. Some people (about 15%) experience an aura (see below) before an attack. The cause of migraine is believed to be chemical reactions in the brain. Treatment for migraine may include over-the-counter or prescription medications. It may also include self-help techniques. These include relaxation training and biofeedback.  Q: What is an aura? A: About 15% of people with migraine get an "aura". This is a sign of neurological symptoms that occur before a migraine headache. You may see wavy or jagged lines, dots, or flashing lights. You might experience tunnel vision or blind spots in one or both eyes. The aura can include visual or auditory hallucinations (something imagined). It may include disruptions in smell (such as strange odors), taste or touch. Other symptoms include:  Numbness.  A "pins and needles" sensation.  Difficulty in recalling or speaking the correct word. These neurological events may last as long as 60 minutes. These symptoms will fade as the headache begins. Q: What is a trigger? A: Certain physical or environmental factors can lead to or "trigger" a migraine. These include:  Foods.  Hormonal changes.  Weather.  Stress. It is important to remember that triggers are different for everyone. To help prevent migraine attacks, you need to figure out which triggers affect you. Keep a headache diary. This is a good way to track triggers. The diary will help you talk to your healthcare professional about your condition. Q: Does  weather affect migraines? A: Bright sunshine, hot, humid conditions, and drastic changes in barometric pressure may lead to, or "trigger," a migraine attack in some people. But studies have shown that weather does not act as a trigger for everyone with migraines. Q: What is the link between migraine and hormones? A: Hormones start and regulate many of your body's functions. Hormones keep your body in balance within a constantly changing environment. The levels of hormones in your body are unbalanced at times. Examples are during menstruation, pregnancy, or menopause. That can lead to a migraine attack. In fact, about three quarters of all women with migraine report that their attacks are related to the menstrual cycle.  Q: Is there an increased risk of stroke for migraine sufferers? A: The likelihood of a migraine attack causing a stroke is very remote. That is not to say that migraine sufferers cannot have a stroke associated with their migraines. In persons under age 53, the most common associated factor for stroke is migraine headache. But over the course of a person's normal life span, the occurrence of migraine headache may actually be associated with a reduced risk of dying from cerebrovascular disease due to stroke.  Q: What are acute medications for migraine? A: Acute medications are used to treat the pain of the headache after it has started. Examples over-the-counter medications, NSAIDs, ergots, and triptans.  Q: What are the triptans? A: Triptans are the newest class of abortive medications. They are specifically targeted to treat migraine. Triptans are vasoconstrictors. They moderate some chemical reactions in the brain. The triptans work on receptors in your brain. Triptans help  to restore the balance of a neurotransmitter called serotonin. Fluctuations in levels of serotonin are thought to be a main cause of migraine.  °Q: Are over-the-counter medications for migraine effective? °A:  Over-the-counter, or "OTC," medications may be effective in relieving mild to moderate pain and associated symptoms of migraine. But you should see your caregiver before beginning any treatment regimen for migraine.  °Q: What are preventive medications for migraine? °A: Preventive medications for migraine are sometimes referred to as "prophylactic" treatments. They are used to reduce the frequency, severity, and length of migraine attacks. Examples of preventive medications include antiepileptic medications, antidepressants, beta-blockers, calcium channel blockers, and NSAIDs (nonsteroidal anti-inflammatory drugs). °Q: Why are anticonvulsants used to treat migraine? °A: During the past few years, there has been an increased interest in antiepileptic drugs for the prevention of migraine. They are sometimes referred to as "anticonvulsants". Both epilepsy and migraine may be caused by similar reactions in the brain.  °Q: Why are antidepressants used to treat migraine? °A: Antidepressants are typically used to treat people with depression. They may reduce migraine frequency by regulating chemical levels, such as serotonin, in the brain.  °Q: What alternative therapies are used to treat migraine? °A: The term "alternative therapies" is often used to describe treatments considered outside the scope of conventional Western medicine. Examples of alternative therapy include acupuncture, acupressure, and yoga. Another common alternative treatment is herbal therapy. Some herbs are believed to relieve headache pain. Always discuss alternative therapies with your caregiver before proceeding. Some herbal products contain arsenic and other toxins. °TENSION HEADACHES °Q: What is a tension-type headache? What causes it? How can I treat it? °A: Tension-type headaches occur randomly. They are often the result of temporary stress, anxiety, fatigue, or anger. Symptoms include soreness in your temples, a tightening band-like sensation  around your head (a "vice-like" ache). Symptoms can also include a pulling feeling, pressure sensations, and contracting head and neck muscles. The headache begins in your forehead, temples, or the back of your head and neck. Treatment for tension-type headache may include over-the-counter or prescription medications. Treatment may also include self-help techniques such as relaxation training and biofeedback. °CLUSTER HEADACHES °Q: What is a cluster headache? What causes it? How can I treat it? °A: Cluster headache gets its name because the attacks come in groups. The pain arrives with little, if any, warning. It is usually on one side of the head. A tearing or bloodshot eye and a runny nose on the same side of the headache may also accompany the pain. Cluster headaches are believed to be caused by chemical reactions in the brain. They have been described as the most severe and intense of any headache type. Treatment for cluster headache includes prescription medication and oxygen. °SINUS HEADACHES °Q: What is a sinus headache? What causes it? How can I treat it? °A: When a cavity in the bones of the face and skull (a sinus) becomes inflamed, the inflammation will cause localized pain. This condition is usually the result of an allergic reaction, a tumor, or an infection. If your headache is caused by a sinus blockage, such as an infection, you will probably have a fever. An x-ray will confirm a sinus blockage. Your caregiver's treatment might include antibiotics for the infection, as well as antihistamines or decongestants.  °REBOUND HEADACHES °Q: What is a rebound headache? What causes it? How can I treat it? °A: A pattern of taking acute headache medications too often can lead to a condition known as "rebound headache."   A pattern of taking too much headache medication includes taking it more than 2 days per week or in excessive amounts. That means more than the label or a caregiver advises. With rebound  headaches, your medications not only stop relieving pain, they actually begin to cause headaches. Doctors treat rebound headache by tapering the medication that is being overused. Sometimes your caregiver will gradually substitute a different type of treatment or medication. Stopping may be a challenge. Regularly overusing a medication increases the potential for serious side effects. Consult a caregiver if you regularly use headache medications more than 2 days per week or more than the label advises. ADDITIONAL QUESTIONS AND ANSWERS Q: What is biofeedback? A: Biofeedback is a self-help treatment. Biofeedback uses special equipment to monitor your body's involuntary physical responses. Biofeedback monitors:  Breathing.  Pulse.  Heart rate.  Temperature.  Muscle tension.  Brain activity. Biofeedback helps you refine and perfect your relaxation exercises. You learn to control the physical responses that are related to stress. Once the technique has been mastered, you do not need the equipment any more. Q: Are headaches hereditary? A: Four out of five (80%) of people that suffer report a family history of migraine. Scientists are not sure if this is genetic or a family predisposition. Despite the uncertainty, a child has a 50% chance of having migraine if one parent suffers. The child has a 75% chance if both parents suffer.  Q: Can children get headaches? A: By the time they reach high school, most young people have experienced some type of headache. Many safe and effective approaches or medications can prevent a headache from occurring or stop it after it has begun.  Q: What type of doctor should I see to diagnose and treat my headache? A: Start with your primary caregiver. Discuss his or her experience and approach to headaches. Discuss methods of classification, diagnosis, and treatment. Your caregiver may decide to recommend you to a headache specialist, depending upon your symptoms or other  physical conditions. Having diabetes, allergies, etc., may require a more comprehensive and inclusive approach to your headache. The National Head[employer] Foundation will provide, upon request, a list of [facility] physician members in your state. Document Released: 03/02/2004 Document Revised: 03/04/2012 Document Reviewed: 08/10/2008 [facility] Patient Information 2014 [place].  Pneumonia, Adult Pneumonia is an infection of the lungs.  CAUSES Pneumonia may be caused by bacteria or a virus. Usually, these infections are caused by breathing infectious particles into the lungs (respiratory tract). SYMPTOMS   Cough.  Fever.  Chest pain.  Increased rate of breathing.  Wheezing.  Mucus production. DIAGNOSIS  If you have the common symptoms of pneumonia, your caregiver will typically confirm the diagnosis with a chest X-ray. The X-ray will show an abnormality in the lung (pulmonary infiltrate) if you have pneumonia. Other tests of your blood, urine, or sputum may be done to find the specific cause of your pneumonia. Your caregiver may also do tests (blood gases or pulse oximetry) to see how well your lungs are working. TREATMENT  Some forms of pneumonia may be spread to other people when you cough or sneeze. You may be asked to wear a mask before and during your exam. Pneumonia that is caused by bacteria is treated with antibiotic medicine. Pneumonia that is caused by the influenza virus may be treated with an antiviral medicine. Most other viral infections must run their course. These infections will not respond to antibiotics.  PREVENTION A pneumococcal shot (vaccine) is available to  prevent a common bacterial cause of pneumonia. This is usually suggested for:  People over 29 years old.  Patients on chemotherapy.  People with chronic lung problems, such as bronchitis or emphysema.  People with immune system problems. If you are over 65 or have a high risk condition, you may receive the  pneumococcal vaccine if you have not received it before. In some countries, a routine influenza vaccine is also recommended. This vaccine can help prevent some cases of pneumonia.You may be offered the influenza vaccine as part of your care. If you smoke, it is time to quit. You may receive instructions on how to stop smoking. Your caregiver can provide medicines and counseling to help you quit. HOME CARE INSTRUCTIONS   Cough suppressants may be used if you are losing too much rest. However, coughing protects you by clearing your lungs. You should avoid using cough suppressants if you can.  Your caregiver may have prescribed medicine if he or she thinks your pneumonia is caused by a bacteria or influenza. Finish your medicine even if you start to feel better.  Your caregiver may also prescribe an expectorant. This loosens the mucus to be coughed up.  Only take over-the-counter or prescription medicines for pain, discomfort, or fever as directed by your caregiver.  Do not smoke. Smoking is a common cause of bronchitis and can contribute to pneumonia. If you are a smoker and continue to smoke, your cough may last several weeks after your pneumonia has cleared.  A cold steam vaporizer or humidifier in your room or home may help loosen mucus.  Coughing is often worse at night. Sleeping in a semi-upright position in a recliner or using a couple pillows under your head will help with this.  Get rest as you feel it is needed. Your body will usually let you know when you need to rest. SEEK IMMEDIATE MEDICAL CARE IF:   Your illness becomes worse. This is especially true if you are elderly or weakened from any other disease.  You cannot control your cough with suppressants and are losing sleep.  You begin coughing up blood.  You develop pain which is getting worse or is uncontrolled with medicines.  You have a fever.  Any of the symptoms which initially brought you in for treatment are getting  worse rather than better.  You develop shortness of breath or chest pain. MAKE SURE YOU:   Understand these instructions.  Will watch your condition.  Will get help right away if you are not doing well or get worse. Document Released: 12/11/2005 Document Revised: 03/04/2012 Document Reviewed: 03/02/2011 [facility] Patient Information 2014 [place], [place].

## 2014-05-22 NOTE — Progress Notes (Addendum)
°  This chart was scribed for Caroleen Hamman, MD by Eston Mould, ED Scribe. This patient was seen in room Room/bed 2 and the patient's care was started at 2:36 PM. Subjective:    Patient ID: Alexandra Hood, female    DOB: 1971-05-26, 43 y.o.   MRN: [id number] Chief Complaint  Patient presents with   Follow-up    ruq pain continues    HPI Alexandra Hood is a 43 y.o. female with a hx of pseudotumor cerebri and Sarcoid who presents to the [facility] for F/U and unchanged RUQ pain . Pt states she seen Dr. Laney Pastor 05/18/2014 for fever, nausea, dysuria, frequency, and flank pain; was discharged with Bactrim and states "the abx were not properly working". States she returned 5/28/201 and was seen by Dr. Lorelei Pont for unchanged pain; was discharged with Colace 100-mg and advised to return to [facility] if sx worsen or persist. States her RUQ has not improved and reports having an episode of emesis last night. States she was placed on Colace and Miralax due to constipation but denies any relief. Reports taking Medrol 2-mg/daily for Sarcoid. Reports having a complete hysterectomy due to tumors.  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  Genitourinary: Positive for frequency and flank pain.  Skin: Negative for rash.   Objective:   Physical Exam    Nursing note and vitals reviewed. Constitutional: She is oriented to person, place, and time. She appears well-developed and well-nourished. No distress.  HENT:  Head: Normocephalic and atraumatic.  Right Ear: External ear normal.  Left Ear: External ear normal.  Eyes: Conjunctivae are normal. Pupils are equal, round, and reactive to light.  Neck: Normal range of motion. Neck supple.  Cardiovascular: Normal rate, regular rhythm and normal heart sounds.   No murmur heard. Pulmonary/Chest: Effort normal and breath sounds normal.  Abdominal: Soft. Bowel sounds are normal. She exhibits no distension and no mass. There is tenderness. There is no rebound and no guarding.  Tender to RUQ without rebound or guarding.  Musculoskeletal: Normal range of motion.  Neurological: She is alert and oriented to person, place, and time. She has normal reflexes.  Skin: Skin is warm and dry. She is not diaphoretic.  Psychiatric: She has a normal mood and affect. Her behavior is normal.   Triage Vitals:BP 126/74   Pulse 99   Temp(Src) 98.2 F (36.8 C) (Oral)   Resp 18   Ht 5' 5.5" (1.664 m)   Wt 219 lb (99.338 kg)   BMI 35.88 kg/m2   SpO2 98% Assessment & Plan:  Abdominal pain, chronic, right upper quadrant - Plan: US Abdomen Limited RUQ, CANCELED: US Abdomen Limited  Signed, Robyn Haber, MD  Addendum: Ultrasound shows heterogeneity of the liver, normal gallbladder. I spoke with the patient over the phone and asked her to get on a low fat diet. I said I would get a gastroenterology consult set up for next week.

## 2014-05-22 NOTE — ED Notes (Signed)
Pt unable to void at this time. 

## 2014-05-22 NOTE — ED Notes (Signed)
Pt reports RUQ pain since Saturday.  Has seen her PMD and was seen at [place] earlier this week for same, had an US done and was negative for gallstones or kidney stones.  She was told however that she has fatty liver.  Pt reports today, she vomited x 1 today after eating and developed a h/a.  Pt reports h/a today as well.

## 2014-05-22 NOTE — Telephone Encounter (Signed)
Patient is calling back to request that Dr. Lorelei Pont proceed and put in referral for CT abdomen/ pelvis. She is still not better.  Best: [phone]

## 2014-05-22 NOTE — Addendum Note (Signed)
Addended by: Robyn Haber on: 05/22/2014 04:13 PM   Modules accepted: Orders

## 2014-05-22 NOTE — ED Provider Notes (Signed)
CSN: [id number]     Arrival date & time 05/22/14  1944 History   First MD Initiated Contact with Patient 05/22/14 2034     Chief Complaint  Patient presents with  . Headache  . Abdominal Pain     (Consider location/radiation/quality/duration/timing/severity/associated sxs/prior Treatment) Patient is a 43 y.o. female presenting with headaches and abdominal pain. The history is provided by the patient.  Headache Pain location:  R temporal and frontal Quality:  Dull Radiates to:  Does not radiate Onset quality:  Sudden Timing:  Constant Progression:  Unchanged Chronicity:  New Similar to prior headaches: no   Context comment:  At rest Relieved by:  Nothing Worsened by:  Nothing tried Ineffective treatments:  NSAIDs Associated symptoms: abdominal pain (Present for past 6 days. R sided, associated nausea. No fevers. Had RUQ US done at [facility] earlier tonight, normal), nausea and vomiting   Associated symptoms: no cough and no fever   Abdominal Pain Associated symptoms: nausea and vomiting   Associated symptoms: no chest pain, no cough, no fever and no shortness of breath     Past Medical History  Diagnosis Date  . Depression   . Pertussis   . Pseudotumor cerebri     .Marland KitchenMarland KitchenDr Sabra Heck and Dr. Katy Fitch - dxed Jan 2009, on diamox  - LP 01/24/2008 at [facility] - Normal ( pressure 14cm, gluc 68, Protein 23, Clear, colorless, RBC 3, WBC - too few to count)  - CT head non contrast done at [facility] 11/25/2007: "normal". Ddoes not report MRI, MRA, MRV being done - 2nd opinon by Dr. Leonie Man - reportdly had MRI/MRV both normal; followup pending  . Lupus pernio     -skin bx confirmed at [facility] 05/07/2009 (personally reviewed) -looks improved 07/09/2009 -  . Pulmonary sarcoidosis     - Arlyce Harman 07/25/2010 on pred 10/mtx 10 -> FEv1 1.84L/775%, FVC 2.25L - SPiro 09/06/2010 -> Fev1 2.14L/81%. Cut pred to 5mg  per day. Cont mtx - SPiro 01/20/2011 - Fev1 1.6L/61%, Ratio 74%. CXR Worse -> increased  pred to 10mg  perday. Cont Mtx.    . Vitamin D deficiency     - Aug 2011: 22 and low -> commence Rx. - Jan 2012: 11  . Ventricular tachycardia    Past Surgical History  Procedure Laterality Date  . 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 Cardiac defibrillator placement      [employer]   Family History  Problem Relation Age of Onset  . Allergies Mother   . Diabetes Mother   . Hypertension Mother   . Hyperlipidemia Mother   . Hyperlipidemia Father   . Hypertension Father   . Hyperlipidemia Sister   . COPD Maternal Grandfather   . Leukemia Paternal Grandmother   . Heart attack Paternal Grandfather    History  Substance Use Topics  . Smoking status: Former Smoker -- 1.00 packs/day for 15 years    Types: Cigarettes    Quit date: 03/03/2007  . Smokeless tobacco: Not on file  . Alcohol Use: No   OB History   Grav Para Term Preterm Abortions TAB SAB Ect Mult Living                 Review of Systems  Constitutional: Negative for fever.  Respiratory: Negative for cough and shortness of breath.   Cardiovascular: Negative for chest pain and leg swelling.  Gastrointestinal: Positive for nausea, vomiting and abdominal pain (Present for past 6 days. R sided, associated  nausea. No fevers. Had RUQ US done at [facility] earlier tonight, normal).  Neurological: Positive for headaches.  All other systems reviewed and are negative.     Allergies  Review of patient's allergies indicates no known allergies.  Home Medications   Prior to Admission medications   Medication Sig Start Date End Date Taking? Authorizing Provider  acetaZOLAMIDE (DIAMOX) 250 MG tablet Take 2 tablets (500 mg total) by mouth 2 (two) times daily. 09/24/13   Ellison Carwin, MD  docusate sodium (COLACE) 100 MG capsule Take 2-4 capsules (200-400 mg total) by mouth 2 (two) times daily. 05/21/14   Mancel Bale, PA-C  folic acid (FOLVITE) 706 MCG tablet Take 800 mcg by mouth 2 (two) times daily.      Historical  Provider, MD  ibuprofen (ADVIL,MOTRIN) 200 MG tablet Take 200 mg by mouth every 6 (six) hours as needed.      Historical Provider, MD  methylPREDNISolone (MEDROL) 2 MG tablet Take 2 mg by mouth daily.    Historical Provider, MD  metoprolol succinate (TOPROL-XL) 25 MG 24 hr tablet Take 3 tablets every morning and 1 tablet every evening 04/25/11   Deboraha Sprang, MD  polyethylene glycol powder (GLYCOLAX/MIRALAX) powder Take 17 g by mouth daily as needed. 05/21/14   Mancel Bale, PA-C  topiramate (TOPAMAX) 25 MG tablet Take 1 tablet (25 mg total) by mouth 2 (two) times daily. 04/25/13   Shawnee Knapp, MD  UNABLE TO FIND Med Name: tumeric    Historical Provider, MD   BP 140/77  Pulse 105  Temp(Src) 98 F (36.7 C) (Oral)  Resp 16  SpO2 98% Physical Exam  Nursing note and vitals reviewed. Constitutional: She is oriented to person, place, and time. She appears well-developed and well-nourished. No distress.  HENT:  Head: Normocephalic and atraumatic.  Mouth/Throat: Oropharynx is clear and moist. No oropharyngeal exudate.  Eyes: EOM are normal. Pupils are equal, round, and reactive to light.  Neck: Normal range of motion. Neck supple.  Cardiovascular: Normal rate and regular rhythm.  Exam reveals no friction rub.   No murmur heard. Pulmonary/Chest: Effort normal and breath sounds normal. No respiratory distress. She has no wheezes. She has no rales.  Abdominal: Soft. She exhibits no distension. There is no tenderness. There is no rebound.  Musculoskeletal: Normal range of motion. She exhibits no edema.  Neurological: She is alert and oriented to person, place, and time. No cranial nerve deficit. She exhibits normal muscle tone. Coordination normal.  Skin: No rash noted. She is not diaphoretic.    ED Course  Procedures (including critical care time) Labs Review Labs Reviewed - No data to display  Imaging Review Dg Abd Acute W/chest  05/21/2014   CLINICAL DATA:  Right flank pain and urinary  frequency  EXAM: ACUTE ABDOMEN SERIES (ABDOMEN 2 VIEW & CHEST 1 VIEW)  COMPARISON:  Prior chest x-ray 10/14/2013  FINDINGS: Stable right upper lung linear scarring versus chronic atelectasis. Left subclavian approach cardiac rhythm maintenance device. Leads project over the right atrium and right ventricle. Cardiac and mediastinal contours are within normal limits. Is the lungs are clear. No focal airspace consolidation, pleural effusion or pneumothorax.  Nonobstructive bowel gas pattern. Moderate amount of formed stool overlies the rectum. No abnormal calcifications overlie the renal shadows or expected course of the ureters. No organomegaly or ascites. Osseous structures are intact and unremarkable.  IMPRESSION: Negative abdominal radiographs.  No acute cardiopulmonary disease.   Electronically Signed   By: Myrle Sheng  Laurence Ferrari M.D.   On: 05/21/2014 17:08   US Abdomen Limited Ruq  05/22/2014   CLINICAL DATA:  Right upper quadrant pain  EXAM: US ABDOMEN LIMITED - RIGHT UPPER QUADRANT  COMPARISON:  None.  FINDINGS: Gallbladder:  No gallstones or wall thickening visualized. No sonographic Murphy sign noted.  Common bile duct:  Diameter: 4 mm  Liver:  Mild heterogeneity is noted. No definitive mass lesion is seen. No ascites is noted.  IMPRESSION: Heterogeneity of the liver of uncertain significance.  No other focal abnormality is noted.   Electronically Signed   By: Inez Catalina M.D.   On: 05/22/2014 15:52     EKG Interpretation None      MDM   Final diagnoses:  Headache  Community acquired pneumonia    31F presents with RUQ pain and headache. RUQ pain: present since Saturday. Had US done earlier today, negative for gallstones or kidney stones. Had labs done yesterday, all normal, LFTs normal, no leukocytosis. Denies dysuria, denies vaginal bleeding. No fevers. On exam has R flank, RUQ, central abdominal pain. Will CT for possible stone. Headaches: Began suddenly today while watching TV. R sided,  pressure-like behind her eyes. Some associated photophobia, no relief with aleve. No family hx of aneurysms. No neurologic deficits. Hx of IIH, headaches do not feel similar to those. Will give migraine cocktail and reassess. Feeling much better after migraine cocktail. Patient's CT of her abdomen shows possible lower lobe pneumonia, which could cause her abdominal pain due to diaphragmatic irritation.  Headache resolved, feeling much better. Patient given rocephin and azithromycin. Given Azithromycin PO Rx. Stable for discharge.  Osvaldo Shipper, MD 05/22/14 [phone]

## 2014-05-22 NOTE — Telephone Encounter (Signed)
Dr. Lorelei Pont returned pt call.

## 2014-05-22 NOTE — ED Notes (Signed)
Patient transported to CT 

## 2014-05-23 ENCOUNTER — Other Ambulatory Visit: Payer: Self-pay | Admitting: Family Medicine

## 2014-05-23 DIAGNOSIS — R932 Abnormal findings on diagnostic imaging of liver and biliary tract: Secondary | ICD-10-CM

## 2014-05-28 ENCOUNTER — Ambulatory Visit (INDEPENDENT_AMBULATORY_CARE_PROVIDER_SITE_OTHER)
Admission: RE | Admit: 2014-05-28 | Discharge: 2014-05-28 | Disposition: A | Discharge status: Home / Self Care | Payer: BC Managed Care – PPO | Source: Ambulatory Visit | Attending: Adult Health | Admitting: Adult Health

## 2014-05-28 ENCOUNTER — Encounter: Payer: Self-pay | Admitting: Adult Health

## 2014-05-28 ENCOUNTER — Other Ambulatory Visit: Payer: BC Managed Care – PPO

## 2014-05-28 ENCOUNTER — Ambulatory Visit (INDEPENDENT_AMBULATORY_CARE_PROVIDER_SITE_OTHER): Payer: BC Managed Care – PPO | Admitting: Adult Health

## 2014-05-28 VITALS — BP 118/72 | HR 86 | Temp 98.3°F | Ht 65.0 in | Wt 225.8 lb

## 2014-05-28 DIAGNOSIS — D869 Sarcoidosis, unspecified: Secondary | ICD-10-CM

## 2014-05-28 MED ORDER — METHYLPREDNISOLONE 4 MG PO TABS: 4.0000 mg | ORAL_TABLET | Freq: Every day | ORAL | Status: DC

## 2014-05-28 MED ORDER — LEVALBUTEROL HCL 0.63 MG/3ML IN NEBU
0.6300 mg | INHALATION_SOLUTION | Freq: Once | RESPIRATORY_TRACT | Status: AC
  Administered 2014-05-28: 0.63 mg via RESPIRATORY_TRACT

## 2014-05-28 NOTE — Assessment & Plan Note (Signed)
Suspect flare with chronic changes on cxr  No apparent infectious ongoing source - no fever or discolored mucus .  Will given steroid burst , check ACE level   Plan  Increase Medrol 16mg  daily for 5 days , 12mg  daily for 5 days , then 8mg  daily for 5 days  Then hold at 4mg  daily  ACE level today  Mucinex DM Twice daily  As needed  Cough/congestion  Follow up Dr. Chase Caller in 4 weeks and As needed   Please contact office for sooner follow up if symptoms do not improve or worsen or seek emergency care

## 2014-05-28 NOTE — Patient Instructions (Signed)
Increase Medrol 16mg  daily for 5 days , 12mg  daily for 5 days , then 8mg  daily for 5 days  Then hold at 4mg  daily  ACE level today  Mucinex DM Twice daily  As needed  Cough/congestion  Follow up Dr. Chase Caller in 4 weeks and As needed   Please contact office for sooner follow up if symptoms do not improve or worsen or seek emergency care

## 2014-05-28 NOTE — Progress Notes (Signed)
Subjective:    Patient ID: Alexandra Hood, female    DOB: 09-Oct-1971, 43 y.o.   MRN: [id number]  HPI  1. Stage 2 Pulmonary sarcoid   -pfts worsen when pred tapered  - s/p 2nd opinion 2010 with Dr Elie Confer at [facility] and 2012 at [facility] on steroids (medrol at her request since Jan 201) and methotrexate and bactrim   2. 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 Lupus Pernio (nasal bridge)  - absent on exam March 2015 5. VIt D Deficiency.  - normalized Oct 2014 and advised to take normal MVT oct 2014 6. Obesity and Pseudotumor cerebrii Body mass index is 23.43 kg/(m^2). on 10/14/2013 7. Pre-diabetes  HgbA1c 6.5 on 09/06/10    OV 07/18/2011: Followup for above. Last seen May Jan 2012. At that time, stilli  Tachycardic. Dr Caryl Comes increased lopressor in June 2012 and now normocardic. Now continuing medrol 8mg  per day instead of prednisone but headaches of pseudoetumor cerebrii persists on and off 2 tiimes per week at mild levels. Appointments with Dr. Katy Fitch and [place] at [facility] still pending. Lupus pernio is resolved.  She insists she is  compliant with medrol 8mg  daily and 10mg  methtotrexate once a week and bactrim prophylaxis. Denies dyspnea or cough but admits to weight gain. No other issues. Last cXr was in marc 9. 2012 and showed stable pulmnary sarcoid. Last LFTs Jan 2012 and chemistries March 2012 - normal.   REC Continue your current medications. If you want your medrol dose cut down, call us  Please see Dr Sabra Heck for headaches  Return in 4 months - at that time do spirometry, lft and vitamin d levels   OV 12/06/2011 Follow up for above. Last seen July 2012. Is a 6 month followup. Feels great. Looking forward to holidays.. No active complaints. Tolerating methotrexate, Medrol and Bactrim for sarcoidosis quite well. Her ICD has not fired and she's not having tachycardia from  a cardiac sarcoid. She is due to see Dr. Caryl Comes soon. The keloid from  her ICD is persistent for is not bothering her. Her lupus pernio in her nose is still in remission.  Her headaches from pseudotumor cerebri continue on off but they are better. She feels that Medrol works better than prednisone in terms of her mood and weight. She has not had blood tests since spring 2012. She says she is compliant with her medications including vitamin D for vitamin D deficiency.  Spirometry today shows Fev1 2.02L/79%. Ratio - normal.  This is essentially baseline for her  REC #Sarcoid Your lung function is 2L/79% and nearly normal Glad you are feeling well Please cut down medrol dose to 4mg  per day Continue  Methotrexate at 10mg  per week; depending on your test results we might cut this down further Continue bactrim as before Have blood test today for cbcd, lft, bmet - will call you with results #VIt D Def - continue vit d replacement - recheck level today #followup  - 5 months - spirometry at followup   OV march 2013  followup Sarcoid, Vit D Def, obesity  Getting over cold - nephews and parents sick. Got sick end of 4-5 days ago. Getting better. HAs related cough due to URI but is resolving. From sarcoid: asymptomatic. Weight continues to be an issue. She does not want to weigh herself duue to embarassment. Currently on medrol 4mg   daily (reduced dose in dec 2012) and methotreaxate 10mg  per week and bactrim 3 per week. Spirometry today continues to hold well despiute cutting down prednisone dose and in fact is best ever: Fev1 2.11/82%, FVC 2.47L/81%, Ratip 85 (101%). Of note, reviewing her labs over the past year I find that she has thrombocytosis > 500 count for over a year. Also, she has not had her HgbA1c checked since 2011 and Vit D checked in a while.   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 She has no specific reason for not following up. She says in the interim she is doing extremely well. Essentially asymptomatic. Lupus pernio has resolved. Denies cough, shortness of breath, chest tightness and wheezing. She says she's been compliant with her methotrexate at 7.5 mg once a week and methylprednisolone 4 milligrams daily. Not taking her Bactrim. Lab work done to first 2014 by primary care physician reveals normal creatinine normal liver function test and normal CBC with her baseline unchanged idiopathic thrombocytosis. Glucose on that day was 88 mg percent. She's not had spirometry her chest x-ray long time approximately 18 months.  Spirometry today's FEV1 2.1 L/84%. FVC 2.5 L 87%. Ratio of 82/98%. And this is her baseline best. CXR today shows just stranding and comaared to 2011 and 2012 cxr, the sarcoidosis has now burnt out  Of note, denies AICD firing as well  Regarding  vitamin D deficiency: She continues to take vitamin D supplements but this level has not been checked in a long time  Regarding obesity: Her weight is unchanged she continues to remain morbidly obese  #Pulmonary and lupus pernio sarcoidosis - apperas to have resolved based on cxr and spirometry - Cut your methyl prednisone down to 2 mg daily - Cut methotrexate down to 5 mg once a week   #Cardiac sacoid  - will re-refer cards to have interrogation - if no V Tach, will work on [employer] steroids and methotrexate permanemntt  #Vitamin D deficiency - Recheck levels today and we will call with the results  #Followup 3 months with spirometry  OV 02/26/2014 Chief Complaint  Patient presents with  . Follow-up    Pt denies nay complaints at this time.    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 She gives a variable story. I also told her to cut her methyl prednisone down to 2 mg daily but she is taking this at 4 mg daily. She is only intermittently compliant with Bactrim for PCP prophylaxis. At any rate her lupus pernio is resolved. She's asymptomatic from a respiratory standpoint. She saw cardiology in October 2014 and her ICD was interrogated and was clear of any V. tach. She did have a sleep study per her history by cardiology and this was normal. Today his spirometry is essentially close to baseline with an FEV1 of 1.85 L or 70%.  The technician doing the test was new to [place] and therefore might not be a reproducible accurate result.   Vitamin D deficiency: October 2014 vitamin  D level was normal. She is now continuing vitamin D at 1000 units daily. I did tell her to take normal multivitamin but am not sure that she is doing that. At next visit we will check her vitamin D levels  Obesity: She continues to be obese. She's ready to stop the steroids and therefore she can lose weight.   05/28/2014 ER follow up  Patient returns for an emergency room followup for pneumonia. Patient was seen on May 29 for abdominal pain. She underwent a CT of the abdomen that showed possible lower lobe pneumonia. She was given a Rocephin injection, along with a Z-Pak. Reports still having some tightness and dyspnea, prod cough with light yellow mucus. Occasional wheezing   finished zpak 6/2.  Denies f/c/s,  hemoptysis, vomiting.  CXR today shows subtle patchy, nodular pace medication over the mid to lower lungs. This is only slightly more prominent compared to chest x-ray in October 2014. Currently Medrol 2mg  daily .  No rash, had lesion on nose couple of weeks ago but resolved.  Previous ACE 100 in 2010     Review of Systems  Constitutional: Negative for  fever and unexpected weight change.  HENT: Negative for congestion, dental problem, ear pain, nosebleeds, postnasal drip, rhinorrhea, sinus pressure, sneezing, sore throat and trouble swallowing.   Eyes: Negative for redness and itching.  Respiratory: Negative for hemopsytsis  +cough, chest tightness, shortness of breath and wheezing.   Cardiovascular: Negative for palpitations and leg swelling.  Gastrointestinal: Negative for nausea and vomiting.  Genitourinary: Negative for dysuria.  Musculoskeletal: Negative for joint swelling.  Skin: Negative for rash.  Neurological: Negative for headaches.  Hematological: Does not bruise/bleed easily.  Psychiatric/Behavioral: Negative for dysphoric mood. The patient is not nervous/anxious.    Scheduled Meds: Continuous Infusions: PRN Meds:.       Objective:   Physical Exam  Vitals reviewed. Constitutional: She is oriented to person, place, and time. She appears well-developed and well-nourished. No distress.  HENT:  Head: Normocephalic and atraumatic.  Right Ear: External ear normal.  Left Ear: External ear normal.  Mouth/Throat: Oropharynx is clear and moist. No oropharyngeal exudate.  Eyes: Conjunctivae and EOM are normal. Pupils are equal, round, and reactive to light. Right eye exhibits no discharge. Left eye exhibits no discharge. No scleral icterus.  Neck: Normal range of motion. Neck supple. No JVD present. No tracheal deviation present. No thyromegaly present.  Cardiovascular: Normal rate, regular rhythm, normal heart sounds and intact distal pulses.  Exam reveals no gallop and no friction rub.   No murmur heard. Pulmonary/Chest:  Scant wheeze  Abdominal: Soft. Bowel sounds are normal. She exhibits no distension and no mass. There is no tenderness. There is no rebound and no guarding.  Musculoskeletal: Normal range of motion. She exhibits no edema and no tenderness.  Lymphadenopathy:    She has no cervical adenopathy.  Neurological:  She is alert and oriented to person, place, and time. She has normal reflexes. No cranial nerve deficit. She exhibits normal muscle tone. Coordination normal.  Skin: Skin is warm and dry. No rash noted. She is not diaphoretic. No erythema. No pallor.  Psychiatric: She has a normal mood and affect. Her behavior is normal. Judgment and thought content normal.    cxr 05/28/2014  Subtle patchy nodular opacification over the mid to lower lungs  slightly more prominent compared to the prior chest radiograph,  although described on patient's recent abdominal CT. May be due to  patient's known sarcoidosis versus  infection. Recommend followup  chest CT in 2-3 weeks.      Assessment & Plan:

## 2014-05-29 LAB — ANGIOTENSIN CONVERTING ENZYME: ANGIOTENSIN-CONVERTING ENZYME: 83 U/L — AB (ref 8–52)

## 2014-06-02 ENCOUNTER — Encounter: Payer: Self-pay | Admitting: Internal Medicine

## 2014-06-03 ENCOUNTER — Ambulatory Visit: Payer: BC Managed Care – PPO | Admitting: Internal Medicine

## 2014-06-03 ENCOUNTER — Telehealth: Payer: Self-pay | Admitting: Adult Health

## 2014-06-03 NOTE — Progress Notes (Signed)
Quick Note:  Called spoke with patient, advised of lab results / recs as stated by TP. Pt verbalized her understanding and denied any questions. ______ 

## 2014-06-03 NOTE — Telephone Encounter (Signed)
Called spoke with patient, advised of lab results as stated below: Result Notes    Notes Recorded by Rinaldo Ratel, CMA on 06/03/2014 at 2:32 PM Called spoke with patient, advised of lab results / recs as stated by TP. Pt verbalized her understanding and denied any questions. ------  Notes Recorded by Rinaldo Ratel, CMA on 06/03/2014 at 12:08 PM LMOM TCB x1. ------  Notes Recorded by Melvenia Needles, NP on 05/29/2014 at 5:18 PM ACE level elevated c/w sarcoid  Cont w/ ov recs  Please contact office for sooner follow up if symptoms do not improve or worsen or seek emergency care

## 2014-06-03 NOTE — Progress Notes (Signed)
Quick Note:  LMOM TCB x1. ______ 

## 2014-06-16 ENCOUNTER — Encounter: Payer: Self-pay | Admitting: Family Medicine

## 2014-06-30 ENCOUNTER — Encounter: Payer: Self-pay | Admitting: Family Medicine

## 2014-07-06 ENCOUNTER — Encounter: Payer: Self-pay | Admitting: Internal Medicine

## 2014-07-06 ENCOUNTER — Ambulatory Visit (INDEPENDENT_AMBULATORY_CARE_PROVIDER_SITE_OTHER): Payer: BC Managed Care – PPO | Admitting: Internal Medicine

## 2014-07-06 VITALS — BP 130/88 | HR 120 | Ht 64.5 in | Wt 221.0 lb

## 2014-07-06 DIAGNOSIS — J189 Pneumonia, unspecified organism: Secondary | ICD-10-CM

## 2014-07-06 DIAGNOSIS — D869 Sarcoidosis, unspecified: Secondary | ICD-10-CM

## 2014-07-06 NOTE — Patient Instructions (Signed)
#  sarcoidosis  - continue medrol at 4mg  per day  #Pneumoia Right lower lobe 05/22/14  - glad you are better but we need to keep an eye on the right sided pain  - if pain gets worse call us  - Do CT chest wo contrast mid-end Aug 2015  #Followup -  - Do CT chest wo contrast mid-end Aug 2015 - return to see me or my NP after CT

## 2014-07-06 NOTE — Progress Notes (Signed)
Subjective:    Patient ID: Alexandra Hood, female    DOB: Jun 16, 1971, 43 y.o.   MRN: [id number]  HPI    OV 07/06/2014  Chief Complaint  Patient presents with  . Follow-up    Pt states her breathing has slightly improved since last OV, pt is able to do more activities. Pt c/o RUQ pain with inspiration. Denies cough and CP/tightness. PT states she has DVT in left lower leg that pt stated she noticed last week.    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 I cut the Medrol to 2 mg once daily. Then on 05/22/2014 she went to the emergency room with abdominal pain right upper quadrant. Apparently this time she did have some fever low grade along with some yellow sputum, right pleuritic chest pain and shortness of breath. CT scan of the abdomen did show some right lower lobe pulmonary infiltrates and she was given Z-Pak. After these all the symptoms resolved except for the right pleuritic chest pain which has largely improved although still persistent and is mild. She did followup with my nurse practitioner 05/28/2014 and had a chest x-ray that shows prominence in the right lower lobe infiltrates suggesting the presence of pneumonia. She had ACEl checked which was 80 and elevated. She's been asked to take Medrol at 4 mg once daily which is doing currently . Overall she is well . Spirometry today is baseline with an FVC of 2.3 the/78%. FEV1 of 1.88 L or 74% and a ratio of 80/96%.   OF note a few days ago she did notice that both calves swelled up and then spontaneously settled. She thinks she had a "blood clot" was no worsening dyspnea or cough.  Review of Systems  Constitutional: Negative for fever and unexpected weight change.  HENT: Negative for congestion, dental problem, ear pain, nosebleeds, postnasal drip, rhinorrhea, sinus pressure, sneezing, sore throat and trouble swallowing.   Eyes: Negative for redness and  itching.  Respiratory: Positive for shortness of breath. Negative for cough, chest tightness and wheezing.   Cardiovascular: Positive for leg swelling. Negative for palpitations.  Gastrointestinal: Positive for abdominal pain. Negative for nausea and vomiting.  Genitourinary: Negative for dysuria.  Musculoskeletal: Negative for joint swelling.  Skin: Negative for rash.  Neurological: Negative for headaches.  Hematological: Does not bruise/bleed easily.  Psychiatric/Behavioral: Negative for dysphoric mood. The patient is not nervous/anxious.   Current outpatient prescriptions:acetaZOLAMIDE (DIAMOX) 250 MG tablet, Take 2 tablets (500 mg total) by mouth 2 (two) times daily., Disp: 120 tablet, Rfl: 1;  methylPREDNISolone (MEDROL) 4 MG tablet, Take 1 tablet (4 mg total) by mouth daily. 4 tabs daily for 5 d , 3 tabs daily for 5 d, 2 tabs daily for 5 d , hold at 4mg  daily, Disp: 75 tablet, Rfl: 0 metoprolol succinate (TOPROL-XL) 25 MG 24 hr tablet, Take 3 tablets every morning and 1 tablet every evening, Disp: 120 tablet, Rfl: 11;  topiramate (TOPAMAX) 25 MG tablet, Take 1 tablet (25 mg total) by mouth 2 (two) times daily., Disp: 180 tablet, Rfl: 3       Objective:   Physical Exam Filed Vitals:   07/06/14 1610  BP: 130/88  Pulse: 120  Height: 5' 4.5" (1.638 m)  Weight: 221 lb (100.245 kg)  SpO2: 97%    Vitals reviewed. Constitutional: She is oriented to person, place, and time. She appears well-developed and well-nourished. No distress.  HENT:  Head:  Normocephalic and atraumatic.  Right Ear: External ear normal.  Left Ear: External ear normal.  Mouth/Throat: Oropharynx is clear and moist. No oropharyngeal exudate.  Eyes: Conjunctivae and EOM are normal. Pupils are equal, round, and reactive to light. Right eye exhibits no discharge. Left eye exhibits no discharge. No scleral icterus.  Neck: Normal range of motion. Neck supple. No JVD present. No tracheal deviation present. No thyromegaly  present.  Cardiovascular: Normal rate, regular rhythm, normal heart sounds and intact distal pulses.  Exam reveals no gallop and no friction rub.   No murmur heard. Pulmonary/Chest: Effort normal and breath sounds normal. No respiratory distress. She has no wheezes. She has no rales. She exhibits no tenderness.  Abdominal: Soft. Bowel sounds are normal. She exhibits no distension and no mass. There is no tenderness. There is no rebound and no guarding.  Musculoskeletal: Normal range of motion. She exhibits no edema and no tenderness.  Lymphadenopathy:    She has no cervical adenopathy.  Neurological: She is alert and oriented to person, place, and time. She has normal reflexes. No cranial nerve deficit. She exhibits normal muscle tone. Coordination normal.  Skin: Skin is warm and dry. No rash noted. She is not diaphoretic. No erythema. No pallor.  Psychiatric: She has a normal mood and affect. Her behavior is normal. Judgment and thought content normal.           Assessment & Plan:  #sarcoidosis  -continue medrol at 4mg  per day; will work on tapering in future again based on lung function and symptoms    #Pneumoia Right lower lobe 05/22/14  - glad you are better but we need to keep an eye on the right sided pain  - if pain gets worse call us  - Do CT chest wo contrast mid-end Aug 2015  #Followup -  - Do CT chest wo contrast mid-end Aug 2015 - return to see me or my NP after CT

## 2014-07-26 ENCOUNTER — Encounter: Payer: Self-pay | Admitting: Internal Medicine

## 2014-07-26 ENCOUNTER — Telehealth: Payer: Self-pay | Admitting: Physician Assistant

## 2014-07-26 ENCOUNTER — Encounter (HOSPITAL_COMMUNITY): Payer: Self-pay | Admitting: Emergency Medicine

## 2014-07-26 ENCOUNTER — Emergency Department (HOSPITAL_COMMUNITY)
Admission: EM | Admit: 2014-07-26 | Discharge: 2014-07-26 | Disposition: A | Discharge status: Home / Self Care | Payer: BC Managed Care – PPO | Attending: Emergency Medicine | Admitting: Emergency Medicine

## 2014-07-26 DIAGNOSIS — Z87891 Personal history of nicotine dependence: Secondary | ICD-10-CM | POA: Insufficient documentation

## 2014-07-26 DIAGNOSIS — Z79899 Other long term (current) drug therapy: Secondary | ICD-10-CM | POA: Insufficient documentation

## 2014-07-26 DIAGNOSIS — J189 Pneumonia, unspecified organism: Secondary | ICD-10-CM | POA: Insufficient documentation

## 2014-07-26 DIAGNOSIS — Z95810 Presence of automatic (implantable) cardiac defibrillator: Secondary | ICD-10-CM | POA: Insufficient documentation

## 2014-07-26 DIAGNOSIS — Z862 Personal history of diseases of the blood and blood-forming organs and certain disorders involving the immune mechanism: Secondary | ICD-10-CM | POA: Insufficient documentation

## 2014-07-26 DIAGNOSIS — Z8619 Personal history of other infectious and parasitic diseases: Secondary | ICD-10-CM | POA: Insufficient documentation

## 2014-07-26 DIAGNOSIS — Y831 Surgical operation with implant of artificial internal device as the cause of abnormal reaction of the patient, or of later complication, without mention of misadventure at the time of the procedure: Secondary | ICD-10-CM | POA: Insufficient documentation

## 2014-07-26 DIAGNOSIS — F329 Major depressive disorder, single episode, unspecified: Secondary | ICD-10-CM | POA: Insufficient documentation

## 2014-07-26 DIAGNOSIS — T82198A Other mechanical complication of other cardiac electronic device, initial encounter: Secondary | ICD-10-CM | POA: Insufficient documentation

## 2014-07-26 DIAGNOSIS — IMO0002 Reserved for concepts with insufficient information to code with codable children: Secondary | ICD-10-CM | POA: Insufficient documentation

## 2014-07-26 DIAGNOSIS — T82118A Breakdown (mechanical) of other cardiac electronic device, initial encounter: Secondary | ICD-10-CM

## 2014-07-26 DIAGNOSIS — F3289 Other specified depressive episodes: Secondary | ICD-10-CM | POA: Insufficient documentation

## 2014-07-26 DIAGNOSIS — Z8639 Personal history of other endocrine, nutritional and metabolic disease: Secondary | ICD-10-CM | POA: Insufficient documentation

## 2014-07-26 LAB — I-STAT TROPONIN, ED: Troponin i, poc: 0 ng/mL (ref 0.00–0.08)

## 2014-07-26 LAB — CBC
HCT: 38.4 % (ref 36.0–46.0)
Hemoglobin: 12.4 g/dL (ref 12.0–15.0)
MCH: 27.8 pg (ref 26.0–34.0)
MCHC: 32.3 g/dL (ref 30.0–36.0)
MCV: 86.1 fL (ref 78.0–100.0)
PLATELETS: 560 10*3/uL — AB (ref 150–400)
RBC: 4.46 MIL/uL (ref 3.87–5.11)
RDW: 14.6 % (ref 11.5–15.5)
WBC: 9 10*3/uL (ref 4.0–10.5)

## 2014-07-26 LAB — BASIC METABOLIC PANEL
Anion gap: 11 (ref 5–15)
BUN: 7 mg/dL (ref 6–23)
CALCIUM: 9.3 mg/dL (ref 8.4–10.5)
CO2: 23 mEq/L (ref 19–32)
CREATININE: 0.9 mg/dL (ref 0.50–1.10)
Chloride: 108 mEq/L (ref 96–112)
GFR calc Af Amer: >90 mL/min (ref >90)
GFR, EST NON AFRICAN AMERICAN: 78 mL/min — AB (ref >90)
GLUCOSE: 99 mg/dL (ref 70–99)
Potassium: 4.3 mEq/L (ref 3.7–5.3)
Sodium: 142 mEq/L (ref 137–147)

## 2014-07-26 LAB — MAGNESIUM: Magnesium: 1.9 mg/dL (ref 1.5–2.5)

## 2014-07-26 NOTE — Discharge Instructions (Signed)
Call Dr. Caryl Comes for a followup appointment.  Return to emergency with any additional difficulties.

## 2014-07-26 NOTE — Telephone Encounter (Signed)
     I returned a phone call from [place] from [employer] who called to make [place] aware that there was a "RV lead integrity alert" on Ms Hightower's defibrillator that occurred yesterday on 07/25/14. I called Dr. Caryl Comes who advised me to call the patient and have her come in to be admitted to the hospital for observation and interrogation of the device. The lead may need to be replaced. The patient reported that her device had been beeping since yesterday. She agreed to come to the ED for admission.    Perry Mount PA-C  [facility]

## 2014-07-26 NOTE — ED Provider Notes (Signed)
CSN: [id number]     Arrival date & time 07/26/14  1649 History   First MD Initiated Contact with Patient 07/26/14 1731     Chief Complaint  Patient presents with  . AICD Problem     HPI  Patient presents with alarm from her defibrillator. States it started bleeding last night. No deliver shocks or events. She received a call from the [employer] wrap and referred here. Was actually met at triage by the [employer]'s pacer tech and had adjustment. She was over sensing. Her alarm has resolved. No pain. No nausea vomiting diarrhea. No medicine changes. No symptoms. Has a pacemaker/tachycardia by secondary pulmonary sarcoidosis. Also Dr. Caryl Comes  Past Medical History  Diagnosis Date  . Depression   . Pertussis   . Pseudotumor cerebri     .Marland KitchenMarland KitchenDr Sabra Heck and Dr. Katy Fitch - dxed Jan 2009, on diamox  - LP 01/24/2008 at [facility] - Normal ( pressure 14cm, gluc 68, Protein 23, Clear, colorless, RBC 3, WBC - too few to count)  - CT head non contrast done at [facility] 11/25/2007: "normal". Ddoes not report MRI, MRA, MRV being done - 2nd opinon by Dr. Leonie Man - reportdly had MRI/MRV both normal; followup pending  . Lupus pernio     -skin bx confirmed at [facility] 05/07/2009 (personally reviewed) -looks improved 07/09/2009 -  . Pulmonary sarcoidosis     - Arlyce Harman 07/25/2010 on pred 10/mtx 10 -> FEv1 1.84L/775%, FVC 2.25L - SPiro 09/06/2010 -> Fev1 2.14L/81%. Cut pred to 32m per day. Cont mtx - SPiro 01/20/2011 - Fev1 1.6L/61%, Ratio 74%. CXR Worse -> increased pred to 150mperday. Cont Mtx.    . Vitamin D deficiency     - Aug 2011: 22 and low -> commence Rx. - Jan 2012: 11  . Ventricular tachycardia    Past Surgical History  Procedure Laterality Date  . 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 Cardiac defibrillator placement      [employer]   Family History  Problem Relation Age of Onset  . Allergies Mother   . Diabetes Mother   . Hypertension Mother   . Hyperlipidemia Mother   . Hyperlipidemia  Father   . Hypertension Father   . Hyperlipidemia Sister   . COPD Maternal Grandfather   . Leukemia Paternal Grandmother   . Heart attack Paternal Grandfather    History  Substance Use Topics  . Smoking status: Former Smoker -- 1.00 packs/day for 15 years    Types: Cigarettes    Quit date: 03/03/2007  . Smokeless tobacco: Not on file  . Alcohol Use: No   OB History   Grav Para Term Preterm Abortions TAB SAB Ect Mult Living                 Review of Systems  Constitutional: Negative for fever, chills, diaphoresis, appetite change and fatigue.  HENT: Negative for mouth sores, sore throat and trouble swallowing.   Eyes: Negative for visual disturbance.  Respiratory: Negative for cough, chest tightness, shortness of breath and wheezing.   Cardiovascular: Negative for chest pain.  Gastrointestinal: Negative for nausea, vomiting, abdominal pain, diarrhea and abdominal distention.  Endocrine: Negative for polydipsia, polyphagia and polyuria.  Genitourinary: Negative for dysuria, frequency and hematuria.  Musculoskeletal: Negative for gait problem.  Skin: Negative for color change, pallor and rash.  Neurological: Negative for dizziness, syncope, light-headedness and headaches.  Hematological: Does not bruise/bleed easily.  Psychiatric/Behavioral: Negative for behavioral problems and confusion.  Allergies  Review of patient's allergies indicates no known allergies.  Home Medications   Prior to Admission medications   Medication Sig Start Date End Date Taking? Authorizing Provider  acetaminophen (TYLENOL) 500 MG tablet Take 1,000 mg by mouth 3 (three) times daily as needed (pain).   Yes Historical Provider, MD  acetaZOLAMIDE (DIAMOX) 250 MG tablet Take 2 tablets (500 mg total) by mouth 2 (two) times daily. 09/24/13  Yes Ellison Carwin, MD  methylPREDNISolone (MEDROL) 4 MG tablet Take 4 mg by mouth daily. Continuous course until stopped by MD   Yes Historical Provider, MD   metoprolol succinate (TOPROL-XL) 25 MG 24 hr tablet Take 25 mg by mouth daily.   Yes Historical Provider, MD  topiramate (TOPAMAX) 25 MG tablet Take 25 mg by mouth at bedtime.   Yes Historical Provider, MD   BP 134/73  Pulse 93  Temp(Src) 98.5 F (36.9 C) (Oral)  Resp 18  Ht 5' 4.5" (1.638 m)  Wt 219 lb (99.338 kg)  BMI 37.02 kg/m2  SpO2 98% Physical Exam  Constitutional: She is oriented to person, place, and time. She appears well-developed and well-nourished. No distress.  HENT:  Head: Normocephalic.  Eyes: Conjunctivae are normal. Pupils are equal, round, and reactive to light. No scleral icterus.  Neck: Normal range of motion. Neck supple. No thyromegaly present.  Cardiovascular: Normal rate and regular rhythm.  Exam reveals no gallop and no friction rub.   No murmur heard. Pulmonary/Chest: Effort normal and breath sounds normal. No respiratory distress. She has no wheezes. She has no rales.  Abdominal: Soft. Bowel sounds are normal. She exhibits no distension. There is no tenderness. There is no rebound.  Musculoskeletal: Normal range of motion.  Neurological: She is alert and oriented to person, place, and time.  Skin: Skin is warm and dry. No rash noted.  Psychiatric: She has a normal mood and affect. Her behavior is normal.    ED Course  Procedures (including critical care time) Labs Review Labs Reviewed  CBC - Abnormal; Notable for the following:    Platelets 560 (*)    All other components within normal limits  BASIC METABOLIC PANEL - Abnormal; Notable for the following:    GFR calc non Af Amer 78 (*)    All other components within normal limits  MAGNESIUM  I-STAT TROPOININ, ED    Imaging Review No results found.   EKG Interpretation   Date/Time:  Sunday July 26 2014 17:12:32 EDT Ventricular Rate:  98 PR Interval:  152 QRS Duration: 66 QT Interval:  336 QTC Calculation: 428 R Axis:   34 Text Interpretation:  Normal sinus rhythm Normal ECG Confirmed  by Jeneen Rinks   MD, [place] ([id number]) on 07/26/2014 8:13:08 PM      MDM   Final diagnoses:  AICD malfunction, initial encounter    Patient was aware of alarm from her pacemaker. Seen here evaluated. The [employer] pacemaker tech has adjusted her device. No additional alarm them. Labs reassuring. Followup with Dr. Caryl Comes.    Tanna Furry, MD 07/26/14 2015

## 2014-07-26 NOTE — ED Notes (Signed)
Pt reports that her defibrillator was beeping at 2100 last night. Pt denies any symptoms with the beeping. [employer] rep here with pt.

## 2014-07-26 NOTE — Assessment & Plan Note (Signed)
#  Pneumoia Right lower lobe 05/22/14  - glad you are better but we need to keep an eye on the right sided pain  - if pain gets worse call us  - Do CT chest wo contrast mid-end Aug 2015

## 2014-07-26 NOTE — Assessment & Plan Note (Signed)
#  sarcoidosis  - continue medrol at 4mg  per day; will work on tapering in future again based on lung function and symptoms and not on ace level

## 2014-07-27 ENCOUNTER — Telehealth: Payer: Self-pay | Admitting: *Deleted

## 2014-07-27 NOTE — Telephone Encounter (Signed)
Per [facility], pt was alerted 07/26/14. LMOVM w/ my direct #. Pt went to ED. ED MD notes state industry rep was notified.

## 2014-07-27 NOTE — Telephone Encounter (Signed)
Programming notes updated in [place]. Pt confirms she went to ER due to device alert tone.

## 2014-07-29 ENCOUNTER — Ambulatory Visit (INDEPENDENT_AMBULATORY_CARE_PROVIDER_SITE_OTHER): Payer: BC Managed Care – PPO | Admitting: Internal Medicine

## 2014-07-29 ENCOUNTER — Encounter: Payer: Self-pay | Admitting: Internal Medicine

## 2014-07-29 ENCOUNTER — Encounter: Payer: Self-pay | Admitting: *Deleted

## 2014-07-29 VITALS — BP 132/87 | HR 103 | Ht 64.5 in | Wt 222.0 lb

## 2014-07-29 DIAGNOSIS — I4729 Other ventricular tachycardia: Secondary | ICD-10-CM

## 2014-07-29 DIAGNOSIS — I472 Ventricular tachycardia: Secondary | ICD-10-CM

## 2014-07-29 DIAGNOSIS — Z95810 Presence of automatic (implantable) cardiac defibrillator: Secondary | ICD-10-CM

## 2014-07-29 NOTE — Progress Notes (Signed)
Patient Care Team: Orma Flaming, MD as PCP - General (Internal Medicine)   HPI  Alexandra Hood is a 43 y.o. female Was seen in the emergency room after her device alarm because of the lead integrity alert. This turned out to be related to T wave oversensing. She unfortunately has very small amplitude R waves and it was their concern that there were no alternate ways of programming to mitigate this problem.  We extended the detection intervals  Previously we had increased sensitivity 0.3--0.6; however, the R-wave amplitude is less than 2 and a standard bipolar configuration  Her device was implanted for ventricular tachycardia occurring in the setting of known pulmonary sarcoid and MRI confirmed cardiac sarcoid.    Past Medical History  Diagnosis Date  . Depression   . Pertussis   . Pseudotumor cerebri     .Marland KitchenMarland KitchenDr Sabra Heck and Dr. Katy Fitch - dxed Jan 2009, on diamox  - LP 01/24/2008 at [facility] - Normal ( pressure 14cm, gluc 68, Protein 23, Clear, colorless, RBC 3, WBC - too few to count)  - CT head non contrast done at [facility] 11/25/2007: "normal". Ddoes not report MRI, MRA, MRV being done - 2nd opinon by Dr. Leonie Man - reportdly had MRI/MRV both normal; followup pending  . Lupus pernio     -skin bx confirmed at [facility] 05/07/2009 (personally reviewed) -looks improved 07/09/2009 -  . Pulmonary sarcoidosis     - Arlyce Harman 07/25/2010 on pred 10/mtx 10 -> FEv1 1.84L/775%, FVC 2.25L - SPiro 09/06/2010 -> Fev1 2.14L/81%. Cut pred to 5mg  per day. Cont mtx - SPiro 01/20/2011 - Fev1 1.6L/61%, Ratio 74%. CXR Worse -> increased pred to 10mg  perday. Cont Mtx.    . Vitamin D deficiency     - Aug 2011: 22 and low -> commence Rx. - Jan 2012: 11  . Ventricular tachycardia     Past Surgical History  Procedure Laterality Date  . 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 Cardiac defibrillator placement      [employer]    Current Outpatient Prescriptions  Medication Sig Dispense  Refill  . acetaminophen (TYLENOL) 500 MG tablet Take 1,000 mg by mouth 3 (three) times daily as needed (pain).      Marland Kitchen acetaZOLAMIDE (DIAMOX) 250 MG tablet Take 2 tablets (500 mg total) by mouth 2 (two) times daily.  120 tablet  1  . methylPREDNISolone (MEDROL) 4 MG tablet Take 4 mg by mouth daily. Continuous course until stopped by MD      . metoprolol succinate (TOPROL-XL) 25 MG 24 hr tablet Take 25 mg by mouth daily.      Marland Kitchen topiramate (TOPAMAX) 25 MG tablet Take 25 mg by mouth at bedtime.       No current facility-administered medications for this visit.    No Known Allergies  Review of Systems negative except from HPI and PMH  Physical Exam BP 132/87  Pulse 103  Ht 5' 4.5" (1.638 m)  Wt 222 lb (100.699 kg)  BMI 37.53 kg/m2 Well developed and well nourished in no acute distress HENT normal E scleral and icterus clear Neck Supple JVP flat; carotids brisk and full The patient's device was interrogated.  The information was reviewed. No changes were made in the programming.   Clear to ausculation  Regular rate and rhythm, no murmurs gallops or rub Soft with active bowel sounds No clubbing cyanosis  Edema Alert and oriented, grossly normal motor and sensory  function Skin Warm and Dry    Assessment and  Plan  T wave oversensing  Ventricular tachycardia No intercurrent Ventricular tachycardia  Implantable defibrillator-[employer]  Cardiomyopathy-sarcoid  After further discussions with [employer] we have changed his sensing configuration from tip to ring>>> tip to-coil. This has resulted in change in amplitude from 2--5 mV and hopefully this will preclude further T wave oversensing.  She has had 2 interval episodes on Sunday until today and we will anticipate this infrequency of events to allow [place] to know whether this is successful and relatively brief period of time  In the event that this does not suffice, we'll have to discuss lead revision. She would like not to do that.  So wouldt I

## 2014-07-29 NOTE — Patient Instructions (Signed)
Your physician recommends that you continue on your current medications as directed. Please refer to the Current Medication list given to you today.  Remote monitoring is used to monitor your Pacemaker of ICD from home. This monitoring reduces the number of office visits required to check your device to one time per year. It allows [place] to keep an eye on the functioning of your device to ensure it is working properly. You are scheduled for a device check from home on 08/10/14. You may send your transmission at any time that day. If you have a wireless device, the transmission will be sent automatically. After your physician reviews your transmission, you will receive a postcard with your next transmission date.

## 2014-08-07 ENCOUNTER — Ambulatory Visit (INDEPENDENT_AMBULATORY_CARE_PROVIDER_SITE_OTHER)
Admission: RE | Admit: 2014-08-07 | Discharge: 2014-08-07 | Disposition: A | Discharge status: Home / Self Care | Payer: BC Managed Care – PPO | Source: Ambulatory Visit | Attending: Internal Medicine | Admitting: Internal Medicine

## 2014-08-07 DIAGNOSIS — J189 Pneumonia, unspecified organism: Secondary | ICD-10-CM

## 2014-08-10 ENCOUNTER — Telehealth: Payer: Self-pay | Admitting: Cardiology

## 2014-08-10 ENCOUNTER — Ambulatory Visit (INDEPENDENT_AMBULATORY_CARE_PROVIDER_SITE_OTHER): Payer: BC Managed Care – PPO | Admitting: *Deleted

## 2014-08-10 DIAGNOSIS — Z95810 Presence of automatic (implantable) cardiac defibrillator: Secondary | ICD-10-CM

## 2014-08-10 LAB — MDC_IDC_ENUM_SESS_TYPE_REMOTE
Battery Voltage: 3.06 V
Brady Statistic AP VP Percent: 0 %
Brady Statistic AP VS Percent: 0.02 %
Brady Statistic AS VP Percent: 0.04 %
Brady Statistic AS VS Percent: 99.94 %
Brady Statistic RA Percent Paced: 0.02 %
Date Time Interrogation Session: 20150817160608
HIGH POWER IMPEDANCE MEASURED VALUE: 47 Ohm
HighPow Impedance: 190 Ohm
HighPow Impedance: 266 Ohm
HighPow Impedance: 37 Ohm
Lead Channel Impedance Value: 342 Ohm
Lead Channel Impedance Value: 456 Ohm
Lead Channel Pacing Threshold Amplitude: 0.5 V
Lead Channel Pacing Threshold Pulse Width: 0.4 ms
Lead Channel Sensing Intrinsic Amplitude: 2.5 mV
Lead Channel Setting Pacing Amplitude: 2 V
Lead Channel Setting Pacing Amplitude: 2.5 V
Lead Channel Setting Pacing Pulse Width: 0.4 ms
MDC IDC MSMT LEADCHNL RA PACING THRESHOLD PULSEWIDTH: 0.4 ms
MDC IDC MSMT LEADCHNL RV PACING THRESHOLD AMPLITUDE: 0.75 V
MDC IDC MSMT LEADCHNL RV SENSING INTR AMPL: 4.25 mV
MDC IDC SET LEADCHNL RV SENSING SENSITIVITY: 0.45 mV
MDC IDC SET ZONE DETECTION INTERVAL: 350 ms
MDC IDC SET ZONE DETECTION INTERVAL: 360 ms
MDC IDC STAT BRADY RV PERCENT PACED: 0.04 %
Zone Setting Detection Interval: 300 ms
Zone Setting Detection Interval: 350 ms

## 2014-08-10 NOTE — Progress Notes (Signed)
Remote ICD transmission.   

## 2014-08-10 NOTE — Telephone Encounter (Signed)
Spoke with pt and reminded pt of remote transmission that is due today. Pt verbalized understanding.   

## 2014-08-17 ENCOUNTER — Ambulatory Visit (INDEPENDENT_AMBULATORY_CARE_PROVIDER_SITE_OTHER): Payer: BC Managed Care – PPO | Admitting: Internal Medicine

## 2014-08-17 ENCOUNTER — Encounter: Payer: Self-pay | Admitting: *Deleted

## 2014-08-17 ENCOUNTER — Encounter: Payer: Self-pay | Admitting: Internal Medicine

## 2014-08-17 VITALS — BP 122/70 | HR 94 | Temp 98.6°F | Ht 64.5 in | Wt 225.2 lb

## 2014-08-17 DIAGNOSIS — D860 Sarcoidosis of lung: Secondary | ICD-10-CM

## 2014-08-17 DIAGNOSIS — J99 Respiratory disorders in diseases classified elsewhere: Secondary | ICD-10-CM

## 2014-08-17 DIAGNOSIS — R918 Other nonspecific abnormal finding of lung field: Secondary | ICD-10-CM

## 2014-08-17 DIAGNOSIS — D869 Sarcoidosis, unspecified: Secondary | ICD-10-CM

## 2014-08-17 NOTE — Patient Instructions (Addendum)
#  sarcoidosis - upper lobes improved to scar tissue  - lower lobes - ? Worse or different problem or waxing/waning of same problem  - continue medrol at 4mg  per day; I am not inclined to increase it based on CT appearance - repeat CT chest wo contrast in dec 2015 (indication : Sarcoid and lung nodules)  #Followup -  - Do CT chest wo contrast mid-end DEc  2015 - return to see me  after CT dec 2015

## 2014-08-17 NOTE — Progress Notes (Signed)
Subjective:    Patient ID: Alexandra Hood, female    DOB: 01-Apr-1971, 43 y.o.   MRN: [id number]  HPI   OV 07/06/2014  Chief Complaint  Patient presents with  . Follow-up    Pt states her breathing has slightly improved since last OV, pt is able to do more activities. Pt c/o RUQ pain with inspiration. Denies cough and CP/tightness. PT states she has DVT in left lower leg that pt stated she noticed last week.    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 I cut the Medrol to 2 mg once daily. Then on 05/22/2014 she went to the emergency room with abdominal pain right upper quadrant. Apparently this time she did have some fever low grade along with some yellow sputum, right pleuritic chest pain and shortness of breath. CT scan of the abdomen did show some right lower lobe pulmonary infiltrates and she was given Z-Pak. After these all the symptoms resolved except for the right pleuritic chest pain which has largely improved although still persistent and is mild. She did followup with my nurse practitioner 05/28/2014 and had a chest x-ray that shows prominence in the right lower lobe infiltrates suggesting the presence of pneumonia. She had ACEl checked which was 80 and elevated. She's been asked to take Medrol at 4 mg once daily which is doing currently . Overall she is well . Spirometry today is baseline with an FVC of 2.3 the/78%. FEV1 of 1.88 L or 74% and a ratio of 80/96%.   OF note a few days ago she did notice that both calves swelled up and then spontaneously settled. She thinks she had a "blood clot" was no worsening dyspnea or cough.    #sarcoidosis  - continue medrol at 4mg  per day  #Pneumoia Right lower lobe 05/22/14  - glad you are better but we need to keep an eye on the right sided pain  - if pain gets worse call us  - Do CT chest wo contrast mid-end Aug 2015  #Followup -  - Do CT chest wo contrast  mid-end Aug 2015 - return to see me or my NP after CT   OV 08/17/2014  Chief Complaint  Patient presents with  . Follow-up    review CT results. Pt denies any increase in SOB, cough, chest tightness.    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 But she had "pneumonia" and some chest pain in spring/summer. So we decided to get CT chest aug 2015. CT shows improvement in UL sarcoid and down to a scar compared to 2010 but lower lobe she has worsening nodular disease. She herself is asymptomatic and feeling well and more keen on tapering off steroids. Denies new problems. 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  However, there are multiple peribronchovascular areas of ground glass and nodularity, in a perilymphatic distribution, new from 2010 but seen on CT AP 05/22/14 (lung bases).  Looks to me like sarcoid progression.  Atypical infection is considered less likely.   Review of Systems  Constitutional: Negative for fever and unexpected weight change.  HENT: Negative for congestion, dental problem, ear pain, nosebleeds, postnasal drip, rhinorrhea, sinus pressure, sneezing, sore  throat and trouble swallowing.   Eyes: Negative for redness and itching.  Respiratory: Negative for cough, chest tightness, shortness of breath and wheezing.   Cardiovascular: Negative for palpitations and leg swelling.  Gastrointestinal: Negative for nausea and vomiting.  Genitourinary: Negative for dysuria.  Musculoskeletal: Negative for joint swelling.  Skin: Negative for rash.  Neurological: Negative for headaches.  Hematological: Does not bruise/bleed easily.  Psychiatric/Behavioral: Negative for dysphoric mood. The patient is not nervous/anxious.        Objective:   Physical Exam   Vitals reviewed. Constitutional: She is oriented to person, place, and time. She appears well-developed and well-nourished. No distress.  Body mass index is 38.07 kg/(m^2).   HENT:  Head: Normocephalic and atraumatic.  Right Ear: External ear normal.  Left Ear: External ear normal.  Mouth/Throat: Oropharynx is clear and moist. No oropharyngeal exudate.  Eyes: Conjunctivae and EOM are normal. Pupils are equal, round, and reactive to light. Right eye exhibits no discharge. Left eye exhibits no discharge. No scleral icterus.  Neck: Normal range of motion. Neck supple. No JVD present. No tracheal deviation present. No thyromegaly present.  Cardiovascular: Normal rate, regular rhythm, normal heart sounds and intact distal pulses.  Exam reveals no gallop and no friction rub.   No murmur heard. Pulmonary/Chest: Effort normal and breath sounds normal. No respiratory distress. She has no wheezes. She has no rales. She exhibits no tenderness.  Abdominal: Soft. Bowel sounds are normal. She exhibits no distension and no mass. There is no tenderness. There is no rebound and no guarding.  Musculoskeletal: Normal range of motion. She exhibits no edema and no tenderness.  Lymphadenopathy:    She has no cervical adenopathy.  Neurological: She is alert and oriented to person, place, and time. She has normal reflexes. No cranial nerve deficit. She exhibits normal muscle tone. Coordination normal.  Skin: Skin is warm and dry. No rash noted. She is not diaphoretic. No erythema. No pallor.  Psychiatric: She has a normal mood and affect. Her behavior is normal. Judgment and thought content normal.   Filed Vitals:   08/17/14 1635  BP: 122/70  Pulse: 94  Temp: 98.6 F (37 C)  TempSrc: Oral  Height: 5' 4.5" (1.638 m)  Weight: 225 lb 3.2 oz (102.15 kg)  SpO2: 95%          Assessment & Plan:  #sarcoidosis - upper lobes improved to scar tissue  - lower lobes - ? Worse or different problem or  waxing/waning of same problem  - continue medrol at 4mg  per day; I am not inclined to increase it based on CT appearance because your breathing test numbers are good and you feel well and your skin lesions are in remission - repeat CT chest wo contrast in dec 2015 (indication : Sarcoid and lung nodules)  #Followup -  - Do CT chest wo contrast mid-end DEc  2015 - return to see me  after CT dec 2015

## 2014-08-24 DIAGNOSIS — D860 Sarcoidosis of lung: Secondary | ICD-10-CM | POA: Insufficient documentation

## 2014-08-24 DIAGNOSIS — R918 Other nonspecific abnormal finding of lung field: Secondary | ICD-10-CM | POA: Insufficient documentation

## 2014-08-24 NOTE — Assessment & Plan Note (Signed)
#  sarcoidosis - upper lobes improved to scar tissue  - lower lobes - ? Worse or different problem or waxing/waning of same problem  - continue medrol at 4mg  per day; I am not inclined to increase it based on CT appearance because your breathing test numbers are good and you feel well and your skin lesions are in remission - repeat CT chest wo contrast in dec 2015 (indication : Sarcoid and lung nodules)  #Followup -  - Do CT chest wo contrast mid-end DEc  2015 - return to see me  after CT dec 2015

## 2014-08-25 ENCOUNTER — Encounter: Payer: Self-pay | Admitting: Cardiology

## 2014-08-27 ENCOUNTER — Encounter: Payer: Self-pay | Admitting: Cardiology

## 2014-09-14 ENCOUNTER — Encounter: Payer: Self-pay | Admitting: Internal Medicine

## 2014-10-18 ENCOUNTER — Ambulatory Visit (INDEPENDENT_AMBULATORY_CARE_PROVIDER_SITE_OTHER): Payer: BC Managed Care – PPO

## 2014-10-18 ENCOUNTER — Ambulatory Visit (INDEPENDENT_AMBULATORY_CARE_PROVIDER_SITE_OTHER): Payer: BC Managed Care – PPO | Admitting: Internal Medicine

## 2014-10-18 VITALS — BP 138/92 | HR 110 | Temp 98.8°F | Resp 18 | Ht 65.0 in | Wt 225.0 lb

## 2014-10-18 DIAGNOSIS — M25571 Pain in right ankle and joints of right foot: Secondary | ICD-10-CM

## 2014-10-18 DIAGNOSIS — M79604 Pain in right leg: Secondary | ICD-10-CM

## 2014-10-18 DIAGNOSIS — S93401A Sprain of unspecified ligament of right ankle, initial encounter: Secondary | ICD-10-CM

## 2014-10-18 NOTE — Progress Notes (Signed)
Subjective:    Patient ID: Alexandra Hood, female    DOB: [birth year], 43 y.o.   MRN: [id number]  [name], MD  Chief Complaint  Patient presents with  . Ankle Injury    Right ankle. Slight swelling and pain. Dull and sharp pain. Happened this morning.    Patient Active Problem List   Diagnosis Date Noted  . Multiple pulmonary nodules 08/24/2014  . 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 CAP (community acquired pneumonia) 07/26/2014  . Sinus tachycardia 10/23/2013  . T wave over sensing 10/23/2013  . 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 Thrombocytosis 03/06/2012  . Obesity 03/06/2012  . Pre-diabetes 03/06/2012  . 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 Keloid--ICD incision 04/25/2011  . 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 DEPRESSION 02/15/2011  . PERTUSSIS 01/20/2011  . Unspecified vitamin D deficiency 01/20/2011  . VENTRICULAR TACHYCARDIA 01/20/2011  . Sarcoidosis 05/12/2009  . 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 Authorizing Provider  acetaminophen (TYLENOL) 500 MG tablet Take 1,000 mg by mouth 3 (three) times daily as needed (pain).   Yes Historical Provider, MD  acetaZOLAMIDE (DIAMOX) 250 MG tablet Take 2 tablets (500 mg total) by mouth 2 (two) times daily. 09/24/13  Yes Roselee Culver, MD  methylPREDNISolone (MEDROL) 4 MG tablet Take 4 mg by mouth daily. Continuous course until stopped by MD   Yes Historical Provider, MD  metoprolol succinate (TOPROL-XL) 25 MG 24 hr tablet Take 25 mg by mouth daily.   Yes Historical Provider, MD  topiramate (TOPAMAX) 25 MG tablet Take 25 mg by mouth at bedtime.   Yes Historical Provider, MD   Medications, allergies, past medical history, surgical history, family history, social history and problem list reviewed and updated.  Ankle Injury    43 yof presents with right ankle pain. She was walking out front door this  morning, stumbled and fell down on front porch. Twisted right ankle as she fell and landed on right side. Did not hit head and had no LOC. She initially put ice on it, took advil and slept for few hours. When she awoke it was still bothering her. She has been able to walk today. Pain worst on outside and front of ankle.   Review of Systems No fever, chills, CP, SOB    Objective:   Physical Exam  Constitutional: She appears well-developed and well-nourished.  BP 138/92  Pulse 110  Temp(Src) 98.8 F (37.1 C) (Oral)  Resp 18  Ht 5\' 5"  (1.651 m)  Wt 225 lb (102.059 kg)  BMI 37.44 kg/m2  SpO2 98%   Cardiovascular:  Pulses:      Dorsalis pedis pulses are 2+ on the right side, and 2+ on the left side.       Posterior tibial pulses are 2+ on the right side, and 2+ on the left side.  Musculoskeletal:       Right ankle: She exhibits swelling. She exhibits normal range of motion and normal pulse. Tenderness. Lateral malleolus and proximal fibula tenderness found. Achilles tendon normal.       Right lower leg: She exhibits tenderness and bony tenderness.       Right foot: She exhibits decreased range of motion, tenderness and bony tenderness. She exhibits normal capillary refill.  TTP over fibular head. Positive tenderness with syndesmoses squeeze test. TTP across midfoot and over lateral malleolus. Mod swelling around lateral malleolus.   Neurological: No sensory deficit.  Psychiatric:  She has a normal mood and affect. Her speech is normal.   [facility] reading (PRIMARY) by  Dr. Laney Pastor. Right ankle: No acute abnormality. Right foot: No acute abnormality.  Right lower leg: No acute abnormality.      Assessment & Plan:   43 yof presents with right ankle, foot, and lower leg pain after stumbling and falling on front porch this AM.   Sprain of ankle, right, initial encounter - Plan: DG Ankle Complete Right Right leg pain - Plan: DG Tibia/Fibula Right Pain in joint, ankle and foot, right -  Plan: DG Foot Complete Right --xrays neg for any fractures --ankle brace fitted and given to pt --RICE encouraged --Tylenol/nsaids as needed for pain/inflammation --Exercises reviewed to restore ROM, strengthen, and instructions to return to regular activity given.  --RTC if not improved in 7-10 days.  Julieta Gutting, PA-C Physician Assistant-Certified [facility]  10/18/2014 3:58 PM  I have participated in the care of this patient with the Advanced Practice Provider and agree with Diagnosis and Plan as documented. Robert P. Laney Pastor, M.D.

## 2014-10-18 NOTE — Patient Instructions (Signed)
Your xrays were negative for any fractures.  Use the acronym RICE to heal --- rest, ice, compression, elevate Use your ankle brace daily while you're out of bed Take ibuprofen 400 mg every six hours as needed for pain and inflammation.  To restore strength and range of motion to your ankle:  Spell out the ABCs with your right foot twice daily  Use a towel to do the exercise we talked about to strengthen your ankle again You can stop using the brace once you're able to jump up and down on your foot without pain.

## 2014-11-12 ENCOUNTER — Ambulatory Visit (INDEPENDENT_AMBULATORY_CARE_PROVIDER_SITE_OTHER): Payer: BC Managed Care – PPO | Admitting: Physician Assistant

## 2014-11-12 VITALS — BP 120/88 | HR 89 | Temp 98.2°F | Resp 16 | Ht 65.5 in | Wt 226.2 lb

## 2014-11-12 DIAGNOSIS — R58 Hemorrhage, not elsewhere classified: Secondary | ICD-10-CM

## 2014-11-12 NOTE — Patient Instructions (Signed)
I do not think the spots on your leg are due to blood clots.  I think they are likely bruises. Warm compresses may help. If the areas so not start to resolve in a few days, if they get bigger, if the pain gets worse, or if you notice new areas, please return to clinic. If you start to experience any chest pain, shortness of breath, pain with breathing, or feel like your heart is racing on you, go to the ED immediately!

## 2014-11-12 NOTE — Progress Notes (Signed)
Subjective:    Patient ID: Alexandra Hood, female    DOB: Sep 03, 1971, 43 y.o.   MRN: [id number]  Alexandra Portela, MD  Chief Complaint  Patient presents with  . Leg Swelling    L leg- x4 days, no injury. Pt states she has blood clots   . Leg Pain    L leg- x4 days   Patient Active Problem List   Diagnosis Date Noted  . Multiple pulmonary nodules 08/24/2014  . 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 Sinus tachycardia 10/23/2013  . T wave over sensing 10/23/2013  . 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 Thrombocytosis 03/06/2012  . Obesity 03/06/2012  . Pre-diabetes 03/06/2012  . 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 Keloid--ICD incision 04/25/2011  . 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 DEPRESSION 02/15/2011  . Unspecified vitamin D deficiency 01/20/2011  . VENTRICULAR TACHYCARDIA 01/20/2011  . Sarcoidosis 05/12/2009  . 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 Authorizing Provider  acetaminophen (TYLENOL) 500 MG tablet Take 1,000 mg by mouth 3 (three) times daily as needed (pain).   Yes Historical Provider, MD  acetaZOLAMIDE (DIAMOX) 250 MG tablet Take 2 tablets (500 mg total) by mouth 2 (two) times daily. 09/24/13  Yes Roselee Culver, MD  methylPREDNISolone (MEDROL) 4 MG tablet Take 4 mg by mouth daily. Continuous course until stopped by MD   Yes Historical Provider, MD  metoprolol succinate (TOPROL-XL) 25 MG 24 hr tablet Take 25 mg by mouth daily.   Yes Historical Provider, MD  topiramate (TOPAMAX) 25 MG tablet Take 25 mg by mouth at bedtime.   Yes Historical Provider, MD   Medications, allergies, past medical history, surgical history, family history, social history and problem list reviewed and updated.  HPI  24 yof with no pertinent PMH returns to clinic today with LLE pain.  She was seen 3.5 wks ago after a fall from standing. Xrays of right foot, ankle, and  tib/fib were done which were all neg. She was diagnosed with a right ankle sprain. Tx with ankle brace, ice, nsaids/tylenol, stretches. She states she followed these instructions and her ankle is much better.  She returned today due to what she thought might be blood clots in her left leg (opposite leg). When she was lying in bed two nights ago she noticed some pain behind her left knee. Mild pain. She looked back and noticed two circular bruise type spots on each side of her leg just above her knee. The pain is centered over each of these spots. No radiation. The pain has continued through yest and today. Rated 2/10 but 4/10 when in bed at night. She cannot recall any trauma to the area. Denies any recent uncontrolled bleeds.   She denies any CP, unusual SOB (she has hx sarcoidosis treated by dr Caryl Comes and dr Chase Caller), pleuritic CP. Has not felt like her heart is racing. She has no personal or fam hx clots. She is not a smoker and not on birth control. No recent travel. She was at a conference couple wks ago where she was seated for maybe 3 consecutive hrs at a time. Vitals today are wnl.   Review of Systems No fever, chills, abd pain, N/V, diarrhea. See HPI.     Objective:   Physical Exam  Constitutional: She appears well-developed and well-nourished.  Non-toxic appearance. She does not have a sickly appearance. She does not appear ill. No distress.  BP 120/88 mmHg  Pulse 89  Temp(Src) 98.2 F (36.8 C) (Oral)  Resp 16  Ht 5' 5.5" (1.664 m)  Wt 226 lb 3.2 oz (102.604 kg)  BMI 37.06 kg/m2  SpO2 99%   Cardiovascular: Normal rate, regular rhythm, S1 normal, S2 normal and normal heart sounds.  Exam reveals no gallop.   No murmur heard. Pulses:      Dorsalis pedis pulses are 2+ on the left side.       Posterior tibial pulses are 2+ on the left side.  Pulmonary/Chest: Effort normal and breath sounds normal. No accessory muscle usage. No tachypnea. No respiratory distress. She has no decreased  breath sounds. She has no wheezes. She has no rhonchi. She has no rales.  Musculoskeletal:  2" x 1" circular purple non raised lesion over medial posterior leg just superior to knee. 0.5" x 0.5" similar lesion over lateral posterior leg just superior to knee. Not palpable. No surrounding erythema. No drainage. Neither ecchymosis extends into popliteal fossa. Mildly TTP over each of these 2 areas. No TTP over popliteal fossa. Normal strength LLE. Normal sensation. Negative homan's sign. Measurement of 21.5" around thigh over area. Measurement of 22" over same area on right leg.       Assessment & Plan:   73 yof with no pertinent PMH returns to clinic today with LLE pain.  Ecchymosis --Two lesions most likely ecchymosis --Low concern for DVT/superficial clot. Minimal risk factors, no erythema, neg homans, no leg swelling. --If sx worse or not resolving in few days rtc - poss u/s --RTC precautions given for ED with any sx of PE  Julieta Gutting, PA-C Physician Assistant-Certified [facility]  11/12/2014 11:55 AM

## 2014-11-13 NOTE — Progress Notes (Signed)
I was directly involved with the patient's care and agree with the diagnosis and treatment plan.

## 2014-12-09 ENCOUNTER — Ambulatory Visit (INDEPENDENT_AMBULATORY_CARE_PROVIDER_SITE_OTHER)
Admission: RE | Admit: 2014-12-09 | Discharge: 2014-12-09 | Disposition: A | Discharge status: Home / Self Care | Payer: BC Managed Care – PPO | Source: Ambulatory Visit | Attending: Internal Medicine | Admitting: Internal Medicine

## 2014-12-09 DIAGNOSIS — D860 Sarcoidosis of lung: Secondary | ICD-10-CM

## 2014-12-09 DIAGNOSIS — R918 Other nonspecific abnormal finding of lung field: Secondary | ICD-10-CM

## 2014-12-11 ENCOUNTER — Telehealth: Payer: Self-pay | Admitting: Internal Medicine

## 2014-12-11 NOTE — Telephone Encounter (Signed)
  CT chest - without change since aug 2015. Give first avail to see me  Dr. Brand Males, M.D., [facility].C.P Pulmonary and Critical Care Medicine Staff Physician [facility] Pager: [phone], If no answer or between  15:00h - 7:00h: call [phone]  12/11/2014 7:01 AM     Ct Chest Wo Contrast  12/09/2014   CLINICAL DATA:  Subsequent encounter for sarcoidosis with pulmonary nodules. Status post steroid treatment.  EXAM: CT CHEST WITHOUT CONTRAST  TECHNIQUE: Multidetector CT imaging of the chest was performed following the standard protocol without IV contrast.  COMPARISON:  08/07/2014.  FINDINGS: Soft tissue / Mediastinum: Left-sided pacer/ AICD again noted. No axillary lymphadenopathy. Stable appearance of small mediastinal lymph nodes. No hilar lymphadenopathy. Heart size is normal. No evidence for pericardial effusion.  Lungs / Pleura: Posterior right upper lobe collapse with associated bronchiectasis is unchanged. There are scattered ill-defined pulmonary nodules bilaterally, following a bronchoalveolar distribution in some areas. Overall imaging features are stable in the interval. Ill-defined index nodule in the right upper lung (Image 21 series 3) is 10 mm today compared to 10 mm previously. Index nodule seen in the right lower lobe on image 31 measures 12 mm today compared to 13 mm previously. There does appear to be a slight rightward predominance of the nodularity. No new or progressing pulmonary disease.  Bones: Bone windows reveal no worrisome lytic or sclerotic osseous lesions.  Upper Abdomen:  Unremarkable.  IMPRESSION: No substantial interval change in exam. Scattered bilateral ill-defined pulmonary nodules, having a bronchoalveolar distribution in some areas, are stable in the interval. No new or progressive pulmonary findings.   Electronically Signed   By: Misty Stanley M.D.   On: 12/09/2014 17:03

## 2014-12-11 NOTE — Telephone Encounter (Signed)
lmtcb for pt. Will need to schedule pt appt with MR when available.

## 2014-12-15 NOTE — Telephone Encounter (Signed)
Called and spoke to pt. Appt made for 01/18/15 with MR. Pt verbalized understanding and denied any further questions or concerns at this time.

## 2014-12-22 ENCOUNTER — Encounter: Payer: Self-pay | Admitting: *Deleted

## 2014-12-30 ENCOUNTER — Ambulatory Visit (INDEPENDENT_AMBULATORY_CARE_PROVIDER_SITE_OTHER): Payer: BC Managed Care – PPO | Admitting: *Deleted

## 2014-12-30 ENCOUNTER — Encounter: Payer: Self-pay | Admitting: Internal Medicine

## 2014-12-30 DIAGNOSIS — I429 Cardiomyopathy, unspecified: Secondary | ICD-10-CM | POA: Diagnosis not present

## 2014-12-30 LAB — MDC_IDC_ENUM_SESS_TYPE_INCLINIC
Battery Voltage: 3.07 V
Brady Statistic AP VP Percent: 0 %
Brady Statistic AS VS Percent: 99.95 %
Date Time Interrogation Session: 20160106142805
HIGH POWER IMPEDANCE MEASURED VALUE: 209 Ohm
HIGH POWER IMPEDANCE MEASURED VALUE: 42 Ohm
HIGH POWER IMPEDANCE MEASURED VALUE: 58 Ohm
HighPow Impedance: 323 Ohm
Lead Channel Impedance Value: 380 Ohm
Lead Channel Impedance Value: 456 Ohm
Lead Channel Pacing Threshold Amplitude: 0.5 V
Lead Channel Pacing Threshold Amplitude: 1 V
Lead Channel Pacing Threshold Pulse Width: 0.4 ms
Lead Channel Sensing Intrinsic Amplitude: 2.375 mV
Lead Channel Sensing Intrinsic Amplitude: 4.625 mV
Lead Channel Setting Pacing Pulse Width: 0.4 ms
Lead Channel Setting Sensing Sensitivity: 0.45 mV
MDC IDC MSMT LEADCHNL RA SENSING INTR AMPL: 2.625 mV
MDC IDC MSMT LEADCHNL RV PACING THRESHOLD PULSEWIDTH: 0.4 ms
MDC IDC MSMT LEADCHNL RV SENSING INTR AMPL: 4.625 mV
MDC IDC SET LEADCHNL RA PACING AMPLITUDE: 2 V
MDC IDC SET LEADCHNL RV PACING AMPLITUDE: 2.5 V
MDC IDC SET ZONE DETECTION INTERVAL: 300 ms
MDC IDC SET ZONE DETECTION INTERVAL: 350 ms
MDC IDC SET ZONE DETECTION INTERVAL: 360 ms
MDC IDC STAT BRADY AP VS PERCENT: 0.01 %
MDC IDC STAT BRADY AS VP PERCENT: 0.04 %
MDC IDC STAT BRADY RA PERCENT PACED: 0.01 %
MDC IDC STAT BRADY RV PERCENT PACED: 0.04 %
Zone Setting Detection Interval: 350 ms

## 2014-12-30 NOTE — Progress Notes (Signed)
ICD check in clinic. Normal device function. Thresholds and sensing consistent with previous device measurements. Impedance trends stable over time. 4 NSVT episodes 1-5 seconds. No mode switches. Histogram distribution appropriate for patient and level of activity. No changes made this session. Device programmed at appropriate safety margins. Device programmed to optimize intrinsic conduction.  Patient education completed including shock plan. Alert tones/vibration demonstrated for patient.  ROV in April with the device clinic.

## 2015-01-18 ENCOUNTER — Ambulatory Visit: Payer: BC Managed Care – PPO | Admitting: Internal Medicine

## 2015-01-22 ENCOUNTER — Ambulatory Visit (INDEPENDENT_AMBULATORY_CARE_PROVIDER_SITE_OTHER): Payer: BC Managed Care – PPO | Admitting: Internal Medicine

## 2015-01-22 ENCOUNTER — Encounter: Payer: Self-pay | Admitting: Internal Medicine

## 2015-01-22 VITALS — BP 144/84 | HR 101 | Ht 65.0 in | Wt 225.0 lb

## 2015-01-22 DIAGNOSIS — D860 Sarcoidosis of lung: Secondary | ICD-10-CM

## 2015-01-22 MED ORDER — MOMETASONE FUROATE 220 MCG/INH IN AEPB: 2.0000 | INHALATION_SPRAY | Freq: Every day | RESPIRATORY_TRACT | Status: DC

## 2015-01-22 MED ORDER — METHYLPREDNISOLONE 4 MG PO TABS: 4.0000 mg | ORAL_TABLET | Freq: Every day | ORAL | Status: DC

## 2015-01-22 NOTE — Addendum Note (Signed)
Addended by: Maurice March on: 01/22/2015 05:46 PM   Modules accepted: Orders

## 2015-01-22 NOTE — Patient Instructions (Addendum)
ICD-9-CM ICD-10-CM   1. Pulmonary sarcoidosis 135 D86.0 Spirometry with Graph   517.8     Concern that sarcoidosis is progressive Continue medrol 4mg  per day Start inhaled steroid - asmanex 2 puff twice daily  Followup PFT in 2 months ROV with Dr Chase Caller in 2 months after PFT

## 2015-01-22 NOTE — Progress Notes (Signed)
Subjective:    Patient ID: Alexandra Hood, female    DOB: 01-06-1971, 44 y.o.   MRN: [id number]  HPI   OV 07/06/2014  Chief Complaint  Patient presents with  . Follow-up    Pt states her breathing has slightly improved since last OV, pt is able to do more activities. Pt c/o RUQ pain with inspiration. Denies cough and CP/tightness. PT states she has DVT in left lower leg that pt stated she noticed last week.    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 I cut the Medrol to 2 mg once daily. Then on 05/22/2014 she went to the emergency room with abdominal pain right upper quadrant. Apparently this time she did have some fever low grade along with some yellow sputum, right pleuritic chest pain and shortness of breath. CT scan of the abdomen did show some right lower lobe pulmonary infiltrates and she was given Z-Pak. After these all the symptoms resolved except for the right pleuritic chest pain which has largely improved although still persistent and is mild. She did followup with my nurse practitioner 05/28/2014 and had a chest x-ray that shows prominence in the right lower lobe infiltrates suggesting the presence of pneumonia. She had ACEl checked which was 80 and elevated. She's been asked to take Medrol at 4 mg once daily which is doing currently . Overall she is well . Spirometry today is baseline with an FVC of 2.3 the/78%. FEV1 of 1.88 L or 74% and a ratio of 80/96%.   OF note a few days ago she did notice that both calves swelled up and then spontaneously settled. She thinks she had a "blood clot" was no worsening dyspnea or cough.    #sarcoidosis  - continue medrol at 4mg  per day  #Pneumoia Right lower lobe 05/22/14  - glad you are better but we need to keep an eye on the right sided pain  - if pain gets worse call us  - Do CT chest wo contrast mid-end Aug 2015  #Followup -  - Do CT chest wo contrast  mid-end Aug 2015 - return to see me or my NP after CT   OV 08/17/2014  Chief Complaint  Patient presents with  . Follow-up    review CT results. Pt denies any increase in SOB, cough, chest tightness.    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 But she had "pneumonia" and some chest pain in spring/summer. So we decided to get CT chest aug 2015. CT shows improvement in UL sarcoid and down to a scar compared to 2010 but lower lobe she has worsening nodular disease. She herself is asymptomatic and feeling well and more keen on tapering off steroids. Denies new problems. 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  However, there are multiple peribronchovascular areas of ground glass and nodularity, in a perilymphatic distribution, new from 2010 but seen on CT AP 05/22/14 (lung bases).  Looks to me like sarcoid progression.  Atypical infection is considered less likely.   OV 01/22/2015 Chief Complaint  Patient presents with  . Follow-up    Pt stated her breathing has improved since last OV. Pt stated her SOB has improved  and she does not become as SOB as before. Pt denies cough and CP/tightness.    44 year old female with pulmonary sarcoidosis stage II with ACD placement  I'm known her for several years. She used to be on methotrexate. 3 years ago she had normal function and on high-dose of steroids. Last time I saw her was in August 2015 at which time she was asymptomatic and was expressing a strong desire for coming off steroids. However CT scan of the chest at that time showed that she had new worsening sarcoidosis in terms of nodules in the lower lobes even though the upper lobe sarcoidosis: Original problem was improved to just a scar tissue. Currently she tells me  she feels the best ever while she is being maintained on Medrol 4 mg per day [she does not like prednisone]. She's asymptomatic. She had a CT scan of the chest in December 2015 that shows stability compared to August 2015 but is definitely worse compared to 2010. We did a spirometry today and it shows continued decline of lung function with an FEV1 of 1.6 L/66%. She is very surprised by this the technique was good and she is asymptomatic. She is really reluctant to go up on the steroids add methotrexate again    Review of Systems  Constitutional: Negative for fever and unexpected weight change.  HENT: Negative for congestion, dental problem, ear pain, nosebleeds, postnasal drip, rhinorrhea, sinus pressure, sneezing, sore throat and trouble swallowing.   Eyes: Negative for redness and itching.  Respiratory: Negative for cough, chest tightness, shortness of breath and wheezing.   Cardiovascular: Negative for palpitations and leg swelling.  Gastrointestinal: Negative for nausea and vomiting.  Genitourinary: Negative for dysuria.  Musculoskeletal: Negative for joint swelling.  Skin: Negative for rash.  Neurological: Negative for headaches.  Hematological: Does not bruise/bleed easily.  Psychiatric/Behavioral: Negative for dysphoric mood. The patient is not nervous/anxious.     Current outpatient prescriptions:  .  acetaminophen (TYLENOL) 500 MG tablet, Take 1,000 mg by mouth 3 (three) times daily as needed (pain)., Disp: , Rfl:  .  acetaZOLAMIDE (DIAMOX) 250 MG tablet, Take 2 tablets (500 mg total) by mouth 2 (two) times daily., Disp: 120 tablet, Rfl: 1 .  methylPREDNISolone (MEDROL) 4 MG tablet, Take 4 mg by mouth daily. Continuous course until stopped by MD, Disp: , Rfl:  .  metoprolol succinate (TOPROL-XL) 25 MG 24 hr tablet, Take 25 mg by mouth daily., Disp: , Rfl:  .  topiramate (TOPAMAX) 25 MG tablet, Take 25 mg by mouth at bedtime., Disp: , Rfl:      Objective:   Physical Exam    Constitutional: She is oriented to person, place, and time. She appears well-developed and well-nourished. No distress.  Body mass index is 37.44 kg/(m^2).   HENT:  Head: Normocephalic and atraumatic.  Right Ear: External ear normal.  Left Ear: External ear normal.  Mouth/Throat: Oropharynx is clear and moist. No oropharyngeal exudate.  Sunglass as always  Eyes: Conjunctivae and EOM are normal. Pupils are equal, round, and reactive to light. Right eye exhibits no discharge. Left eye exhibits no discharge. No scleral icterus.  Neck: Normal range of motion. Neck supple. No JVD present. No tracheal deviation present. No thyromegaly present.  Cardiovascular: Normal rate, regular rhythm, normal heart sounds and intact distal pulses.  Exam reveals no gallop and no friction rub.   No murmur heard. Pulmonary/Chest: Effort normal and breath sounds normal. No respiratory distress. She has no wheezes. She has no  rales. She exhibits no tenderness.  Abdominal: Soft. Bowel sounds are normal. She exhibits no distension and no mass. There is no tenderness. There is no rebound and no guarding.  Musculoskeletal: Normal range of motion. She exhibits no edema or tenderness.  Lymphadenopathy:    She has no cervical adenopathy.  Neurological: She is alert and oriented to person, place, and time. She has normal reflexes. No cranial nerve deficit. She exhibits normal muscle tone. Coordination normal.  Skin: Skin is warm and dry. No rash noted. She is not diaphoretic. No erythema. No pallor.  Psychiatric: She has a normal mood and affect. Her behavior is normal. Judgment and thought content normal.  Vitals reviewed.    Filed Vitals:   01/22/15 1628  BP: 144/84  Pulse: 101  Height: 5\' 5"  (1.651 m)  Weight: 225 lb (102.059 kg)  SpO2: 98%        Assessment & Plan:     ICD-9-CM ICD-10-CM   1. Pulmonary sarcoidosis 135 D86.0 Spirometry with Graph   517.8  Pulmonary function test   I'm concerned that  she might have progressive pulmonary t sarcoidosis and asymptomatic state might just be a forerunner to developing symptoms. Given relatively new lower lobe findings compared to 2010 we discussed the possibly of doing bronchoalveolar lavage for opportunistic infection but the nodule seems stable between August 2015 and December 2015 and she's asymptomatic therefore we opted to wait. We discussed increasing steroids but she is reluctant about doing this. We then mutually agreed to try a course of inhaled corticosteroids which she says has helped her friend. I will see her again in 2 months with a full pulmonary function test. Clearly if she has worsening lung function and will consider bronchoscopy with lavage before addressing increased immune suppression   Dr. Brand Males, M.D., [facility] Pulmonary and Critical Care Medicine Staff Physician [facility] Pager: [phone], If no answer or between  15:00h - 7:00h: call [phone]  01/22/2015 5:28 PM

## 2015-02-01 ENCOUNTER — Telehealth: Payer: Self-pay | Admitting: Internal Medicine

## 2015-02-01 NOTE — Telephone Encounter (Signed)
New message     1. What dental office are you calling from? Dr Wyline Beady  2. What is your office phone and fax number? [phone]  3. What type of procedure is the patient having performed? Tooth extracted  4. What date is procedure scheduled? Pt is in chair now  5. What is your question (ex. Antibiotics prior to procedure, holding medication-we need to know how long dentist wants pt to hold med)?  Will pt need antibiotic prior to extraction

## 2015-02-01 NOTE — Telephone Encounter (Signed)
Returned call. Since patient has not had any recent procedures or any valve replacement, antibiotics are not recommended.

## 2015-02-10 ENCOUNTER — Telehealth: Payer: Self-pay | Admitting: *Deleted

## 2015-02-10 NOTE — Telephone Encounter (Signed)
Give her QVAR 21mcg  2 puff twice daily

## 2015-02-10 NOTE — Telephone Encounter (Signed)
Called [facility] to initiate PA for Asmanex.   Denied.    Patient must try Flovent Diskus, Qvar, Pulmicort, Anuity Ellipta.  Qvar = covered, Pt out-of-pocket co-pay $40 Pulmicort = covered, pt out-of-pocket co-pay $40  Flovent = covered, pt out-of-pocket co-pay $ 40 Anuity = covered, pt out-of-pocket co-pay $40

## 2015-02-11 MED ORDER — BECLOMETHASONE DIPROPIONATE 80 MCG/ACT IN AERS: 2.0000 | INHALATION_SPRAY | Freq: Two times a day (BID) | RESPIRATORY_TRACT | Status: DC

## 2015-02-11 NOTE — Telephone Encounter (Signed)
lmomtcb x1 

## 2015-02-11 NOTE — Telephone Encounter (Signed)
Pt returned call. Informed pt of the recs per MR. Rx sent to preferred pharmacy. Pt verbalized understanding and denied any further questions or concerns at this time.

## 2015-03-23 ENCOUNTER — Telehealth: Payer: Self-pay | Admitting: Internal Medicine

## 2015-03-23 NOTE — Telephone Encounter (Signed)
Spoke with the pt and notified of the below recs per MR  She verbalized understanding  Will forward to MR to track per his request  Thanks

## 2015-03-23 NOTE — Telephone Encounter (Signed)
MR- pt rescheduled her PFT for 03/24/15 but is refusing ov with you b/c she is frustrated you are moving your schedule for that day She wants you to call her with the PFT results  Please advise, thanks

## 2015-03-23 NOTE — Telephone Encounter (Signed)
Please forward my apologies. Let her know that " I understand she works and has a tough schedule and my apologies for reschduling at last minute" I will call with PFT results and then discuss with her on phone a plan. Please send message back to me so I can track

## 2015-03-24 ENCOUNTER — Ambulatory Visit: Payer: BC Managed Care – PPO | Admitting: Internal Medicine

## 2015-03-24 ENCOUNTER — Ambulatory Visit (INDEPENDENT_AMBULATORY_CARE_PROVIDER_SITE_OTHER): Payer: BC Managed Care – PPO | Admitting: Internal Medicine

## 2015-03-24 DIAGNOSIS — D860 Sarcoidosis of lung: Secondary | ICD-10-CM | POA: Diagnosis not present

## 2015-03-24 LAB — PULMONARY FUNCTION TEST
DL/VA % pred: 110 %
DL/VA: 5.44 ml/min/mmHg/L
DLCO UNC % PRED: 69 %
DLCO UNC: 17.92 ml/min/mmHg
FEF 25-75 POST: 2.78 L/s
FEF 25-75 Pre: 2.5 L/sec
FEF2575-%Change-Post: 11 %
FEF2575-%Pred-Post: 98 %
FEF2575-%Pred-Pre: 88 %
FEV1-%Change-Post: 1 %
FEV1-%Pred-Post: 81 %
FEV1-%Pred-Pre: 79 %
FEV1-Post: 2.11 L
FEV1-Pre: 2.07 L
FEV1FVC-%CHANGE-POST: 2 %
FEV1FVC-%Pred-Pre: 103 %
FEV6-%CHANGE-POST: 0 %
FEV6-%Pred-Post: 77 %
FEV6-%Pred-Pre: 78 %
FEV6-Post: 2.41 L
FEV6-Pre: 2.42 L
FEV6FVC-%PRED-POST: 102 %
FEV6FVC-%Pred-Pre: 102 %
FVC-%CHANGE-POST: 0 %
FVC-%PRED-PRE: 76 %
FVC-%Pred-Post: 76 %
FVC-Post: 2.41 L
Post FEV1/FVC ratio: 87 %
Post FEV6/FVC ratio: 100 %
Pre FEV1/FVC ratio: 86 %
Pre FEV6/FVC Ratio: 100 %
RV % PRED: 49 %
RV: 0.83 L
TLC % PRED: 60 %
TLC: 3.15 L

## 2015-03-24 NOTE — Progress Notes (Signed)
PFT done today. 

## 2015-03-26 ENCOUNTER — Ambulatory Visit (INDEPENDENT_AMBULATORY_CARE_PROVIDER_SITE_OTHER): Payer: BC Managed Care – PPO

## 2015-03-26 ENCOUNTER — Ambulatory Visit (INDEPENDENT_AMBULATORY_CARE_PROVIDER_SITE_OTHER): Payer: BC Managed Care – PPO | Admitting: Family Medicine

## 2015-03-26 VITALS — BP 126/82 | HR 102 | Temp 98.2°F | Resp 16 | Ht 65.0 in | Wt 224.6 lb

## 2015-03-26 DIAGNOSIS — D869 Sarcoidosis, unspecified: Secondary | ICD-10-CM

## 2015-03-26 DIAGNOSIS — R0781 Pleurodynia: Secondary | ICD-10-CM

## 2015-03-26 MED ORDER — METHYLPREDNISOLONE 4 MG PO TABS: 4.0000 mg | ORAL_TABLET | Freq: Every day | ORAL | Status: DC

## 2015-03-26 NOTE — Progress Notes (Signed)
This chart was scribed for Dr. Robyn Haber, MD by Erling Conte, Medical Scribe. This patient was seen in Room 10  and the patient's care was started at 5:27 PM.  Patient ID: Alexandra Hood MRN: [id number], DOB: 05/24/1971, 44 y.o. Date of Encounter: 03/26/2015, 5:26 PM  Primary Physician: [name], MD  Chief Complaint:  Chief Complaint  Patient presents with  . Chest Pain    on left side, worse when she inhales x 2 days.     HPI: 44 y.o. year old female with history below presents with left rib and left chest wall pain that began two days ago. She denies any falls or injuries. She states the pain is exacerbated with deep inspiration and coughing. She states she has a h/o of costochondritis. She denies any rash to the area. Pt has taken Tylenol for the pain with no relief. She denies any dysuria, pain with bowel movements, SOB, or breast tenderness  Pt works as a Animal nutritionist at [employer]   Past Medical History  Diagnosis Date  . Depression   . Pertussis   . Pseudotumor cerebri     .Marland KitchenMarland KitchenDr Sabra Heck and Dr. Katy Fitch - dxed Jan 2009, on diamox  - LP 01/24/2008 at [facility] - Normal ( pressure 14cm, gluc 68, Protein 23, Clear, colorless, RBC 3, WBC - too few to count)  - CT head non contrast done at [facility] 11/25/2007: "normal". Ddoes not report MRI, MRA, MRV being done - 2nd opinon by Dr. Leonie Man - reportdly had MRI/MRV both normal; followup pending  . Lupus pernio     -skin bx confirmed at [facility] 05/07/2009 (personally reviewed) -looks improved 07/09/2009 -  . Pulmonary sarcoidosis     - Arlyce Harman 07/25/2010 on pred 10/mtx 10 -> FEv1 1.84L/775%, FVC 2.25L - SPiro 09/06/2010 -> Fev1 2.14L/81%. Cut pred to 5mg  per day. Cont mtx - SPiro 01/20/2011 - Fev1 1.6L/61%, Ratio 74%. CXR Worse -> increased pred to 10mg  perday. Cont Mtx.    . Vitamin D deficiency     - Aug 2011: 22 and low -> commence Rx. - Jan 2012: 11  . Ventricular tachycardia       Home Meds: Prior to Admission medications   Medication Sig Start Date End Date Taking? Authorizing Provider  acetaminophen (TYLENOL) 500 MG tablet Take 1,000 mg by mouth 3 (three) times daily as needed (pain).   Yes Historical Provider, MD  acetaZOLAMIDE (DIAMOX) 250 MG tablet Take 2 tablets (500 mg total) by mouth 2 (two) times daily. 09/24/13  Yes Roselee Culver, MD  beclomethasone (QVAR) 80 MCG/ACT inhaler Inhale 2 puffs into the lungs 2 (two) times daily. 02/11/15  Yes Brand Males, MD  methylPREDNISolone (MEDROL) 4 MG tablet Take 1 tablet (4 mg total) by mouth daily. Continuous course until stopped by MD 01/22/15  Yes Brand Males, MD  metoprolol succinate (TOPROL-XL) 25 MG 24 hr tablet Take 25 mg by mouth daily.   Yes Historical Provider, MD  topiramate (TOPAMAX) 25 MG tablet Take 25 mg by mouth at bedtime.   Yes Historical Provider, MD    Allergies: No Known Allergies  History   Social History  . Marital Status: Legally Separated    Spouse Name: N/A  . Number of Children: 1  . Years of Education: N/A   Occupational History  . school counsler Restaurant manager, fast food)    Social History Main Topics  . Smoking status: Former Smoker -- 1.00 packs/day for 15 years  Types: Cigarettes    Quit date: 03/03/2007  . Smokeless tobacco: Not on file  . Alcohol Use: No  . Drug Use: No  . Sexual Activity: Yes   Other Topics Concern  . 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 Wants letter      Review of Systems Constitutional: negative for chills, fever, night sweats, weight changes, or fatigue  HEENT: negative for vision changes, hearing loss, congestion, rhinorrhea, ST, epistaxis, or sinus pressure Cardiovascular: negative for chest pain or palpitations Respiratory: positive for left rib/left chest wall pain. negative for hemoptysis, wheezing, shortness of breath, or cough Abdominal:  negative for abdominal pain, pain with bowel movements, nausea, vomiting, diarrhea, or constipation Dermatological: negative for rash Neurologic: negative for headache, dizziness, or syncope GU: negative for breast pain All other systems reviewed and are otherwise negative with the exception to those above and in the HPI.   Physical Exam: Blood pressure 126/82, pulse 102, temperature 98.2 F (36.8 C), temperature source Oral, resp. rate 16, height 5\' 5"  (1.651 m), weight 224 lb 9.6 oz (101.878 kg), SpO2 97 %., Body mass index is 37.38 kg/(m^2). General: Well developed, well nourished, in no acute distress. Head: Normocephalic, atraumatic, eyes without discharge, sclera non-icteric, nares are without discharge. Bilateral auditory canals clear, TM's are without perforation, pearly grey and translucent with reflective cone of light bilaterally. Oral cavity moist, posterior pharynx without exudate, erythema, peritonsillar abscess, or post nasal drip.elevated disc on fundoscopic examination  Neck: Supple. No thyromegaly. Full ROM. No lymphadenopathy. Lungs: Clear bilaterally to auscultation without wheezes, rales, or rhonchi. Breathing is unlabored. Chest wall tenderness along 7th rib Heart: RRR with S1 S2. No murmurs, rubs, or gallops appreciated. Abdomen: Soft, non-tender, non-distended with normoactive bowel sounds. No hepatomegaly. No rebound/guarding. No obvious abdominal masses. Msk:  Strength and tone normal for age. Extremities/Skin: Warm and dry. No clubbing or cyanosis. No edema. No rashes or suspicious lesions. Neuro: Alert and oriented X 3. Moves all extremities spontaneously. Gait is normal. CNII-XII grossly in tact. Lymph: No lymphadenopathy GU: no breast tenderness Psych:  Responds to questions appropriately with a normal affect.   [facility] reading (PRIMARY) by  Dr. Joseph Art: Chest x-ray shows pacemaker in appropriate location with intact wires, infiltrates in both lung fields  consistent with sarcoidosis, normal ribs.     ASSESSMENT AND PLAN:  44 y.o. year old female with  This chart was scribed in my presence and reviewed by me personally.    ICD-9-CM ICD-10-CM   1. Rib pain on left side 786.50 R07.81 DG Chest 2 View     methylPREDNISolone (MEDROL) 4 MG tablet  2. Sarcoidosis 135 D86.9 methylPREDNISolone (MEDROL) 4 MG tablet     Signed, Robyn Haber, MD 03/26/2015 5:26 PM

## 2015-03-31 NOTE — Telephone Encounter (Signed)
MR please advise if this message can be closed.  Thanks! 

## 2015-04-01 ENCOUNTER — Ambulatory Visit (INDEPENDENT_AMBULATORY_CARE_PROVIDER_SITE_OTHER): Payer: BC Managed Care – PPO | Admitting: *Deleted

## 2015-04-01 ENCOUNTER — Encounter: Payer: Self-pay | Admitting: Internal Medicine

## 2015-04-01 DIAGNOSIS — I429 Cardiomyopathy, unspecified: Secondary | ICD-10-CM | POA: Diagnosis not present

## 2015-04-01 DIAGNOSIS — I471 Supraventricular tachycardia: Secondary | ICD-10-CM | POA: Diagnosis not present

## 2015-04-01 DIAGNOSIS — R000 Tachycardia, unspecified: Secondary | ICD-10-CM

## 2015-04-01 DIAGNOSIS — I4729 Other ventricular tachycardia: Secondary | ICD-10-CM

## 2015-04-01 DIAGNOSIS — I472 Ventricular tachycardia: Secondary | ICD-10-CM | POA: Diagnosis not present

## 2015-04-01 LAB — MDC_IDC_ENUM_SESS_TYPE_INCLINIC
Battery Voltage: 3.02 V
Brady Statistic AP VP Percent: 0 %
Brady Statistic AP VS Percent: 0.01 %
Brady Statistic RA Percent Paced: 0.01 %
Date Time Interrogation Session: 20160407092603
HIGH POWER IMPEDANCE MEASURED VALUE: 304 Ohm
HIGH POWER IMPEDANCE MEASURED VALUE: 46 Ohm
HIGH POWER IMPEDANCE MEASURED VALUE: 53 Ohm
HighPow Impedance: 190 Ohm
Lead Channel Impedance Value: 399 Ohm
Lead Channel Impedance Value: 456 Ohm
Lead Channel Pacing Threshold Amplitude: 0.5 V
Lead Channel Pacing Threshold Pulse Width: 0.4 ms
Lead Channel Sensing Intrinsic Amplitude: 2.625 mV
Lead Channel Sensing Intrinsic Amplitude: 4.25 mV
Lead Channel Sensing Intrinsic Amplitude: 5.5 mV
Lead Channel Setting Pacing Amplitude: 2.5 V
Lead Channel Setting Sensing Sensitivity: 0.45 mV
MDC IDC MSMT LEADCHNL RA SENSING INTR AMPL: 3.5 mV
MDC IDC MSMT LEADCHNL RV PACING THRESHOLD AMPLITUDE: 0.875 V
MDC IDC MSMT LEADCHNL RV PACING THRESHOLD PULSEWIDTH: 0.4 ms
MDC IDC SET LEADCHNL RA PACING AMPLITUDE: 2 V
MDC IDC SET LEADCHNL RV PACING PULSEWIDTH: 0.4 ms
MDC IDC SET ZONE DETECTION INTERVAL: 300 ms
MDC IDC SET ZONE DETECTION INTERVAL: 350 ms
MDC IDC STAT BRADY AS VP PERCENT: 0.04 %
MDC IDC STAT BRADY AS VS PERCENT: 99.95 %
MDC IDC STAT BRADY RV PERCENT PACED: 0.04 %
Zone Setting Detection Interval: 350 ms
Zone Setting Detection Interval: 360 ms

## 2015-04-01 MED ORDER — METOPROLOL SUCCINATE ER 25 MG PO TB24: 25.0000 mg | ORAL_TABLET | Freq: Every day | ORAL | Status: DC

## 2015-04-01 NOTE — Progress Notes (Signed)
ICD check in clinic. Normal device function. Thresholds and sensing consistent with previous device measurements. Impedance trends stable over time. SIC=0. 3 ventricular arrhythmias---max dur. 32 bts, Max Avg V 231. No mode switches. Histogram distribution appropriate for patient and level of activity. Stable thoracic impedance. No changes made this session. Device programmed at appropriate safety margins. Device programmed to optimize intrinsic conduction. Batt voltage 3.02V (ERI 2.63V). Pt enrolled in remote follow-up. Plan to follow up with SK in 07-2015. Patient education completed including shock plan. Alert tones demonstrated for patient.

## 2015-04-06 NOTE — Telephone Encounter (Signed)
Tammy can you help with this? Thanks.

## 2015-04-06 NOTE — Telephone Encounter (Signed)
I had to sign the pft in [place] so Tammy wilson can make it flow toi epic (apparently some error) . However, I did last week and still do not see it in epic. After that I need to call patient. Please send message back to me after it flows into epic

## 2015-04-06 NOTE — Telephone Encounter (Signed)
MR is anything further needed with this message?  Thanks!

## 2015-04-09 NOTE — Telephone Encounter (Signed)
MR the pft from 3/30 is visible in Epic.  Please advise on recs.  Thanks!

## 2015-04-11 IMAGING — CR DG CHEST 2V
2 series · 2 of 2 positions shown · non-contrast
Comparison: 10/14/2013 and CT abdomen 05/22/2014

CLINICAL DATA: Followup pneumonia. Cough and shortness of breath
with chest pain 1 week. History of sarcoidosis.

EXAM:
CHEST  2 VIEW

[view not recorded (1 of 2)]
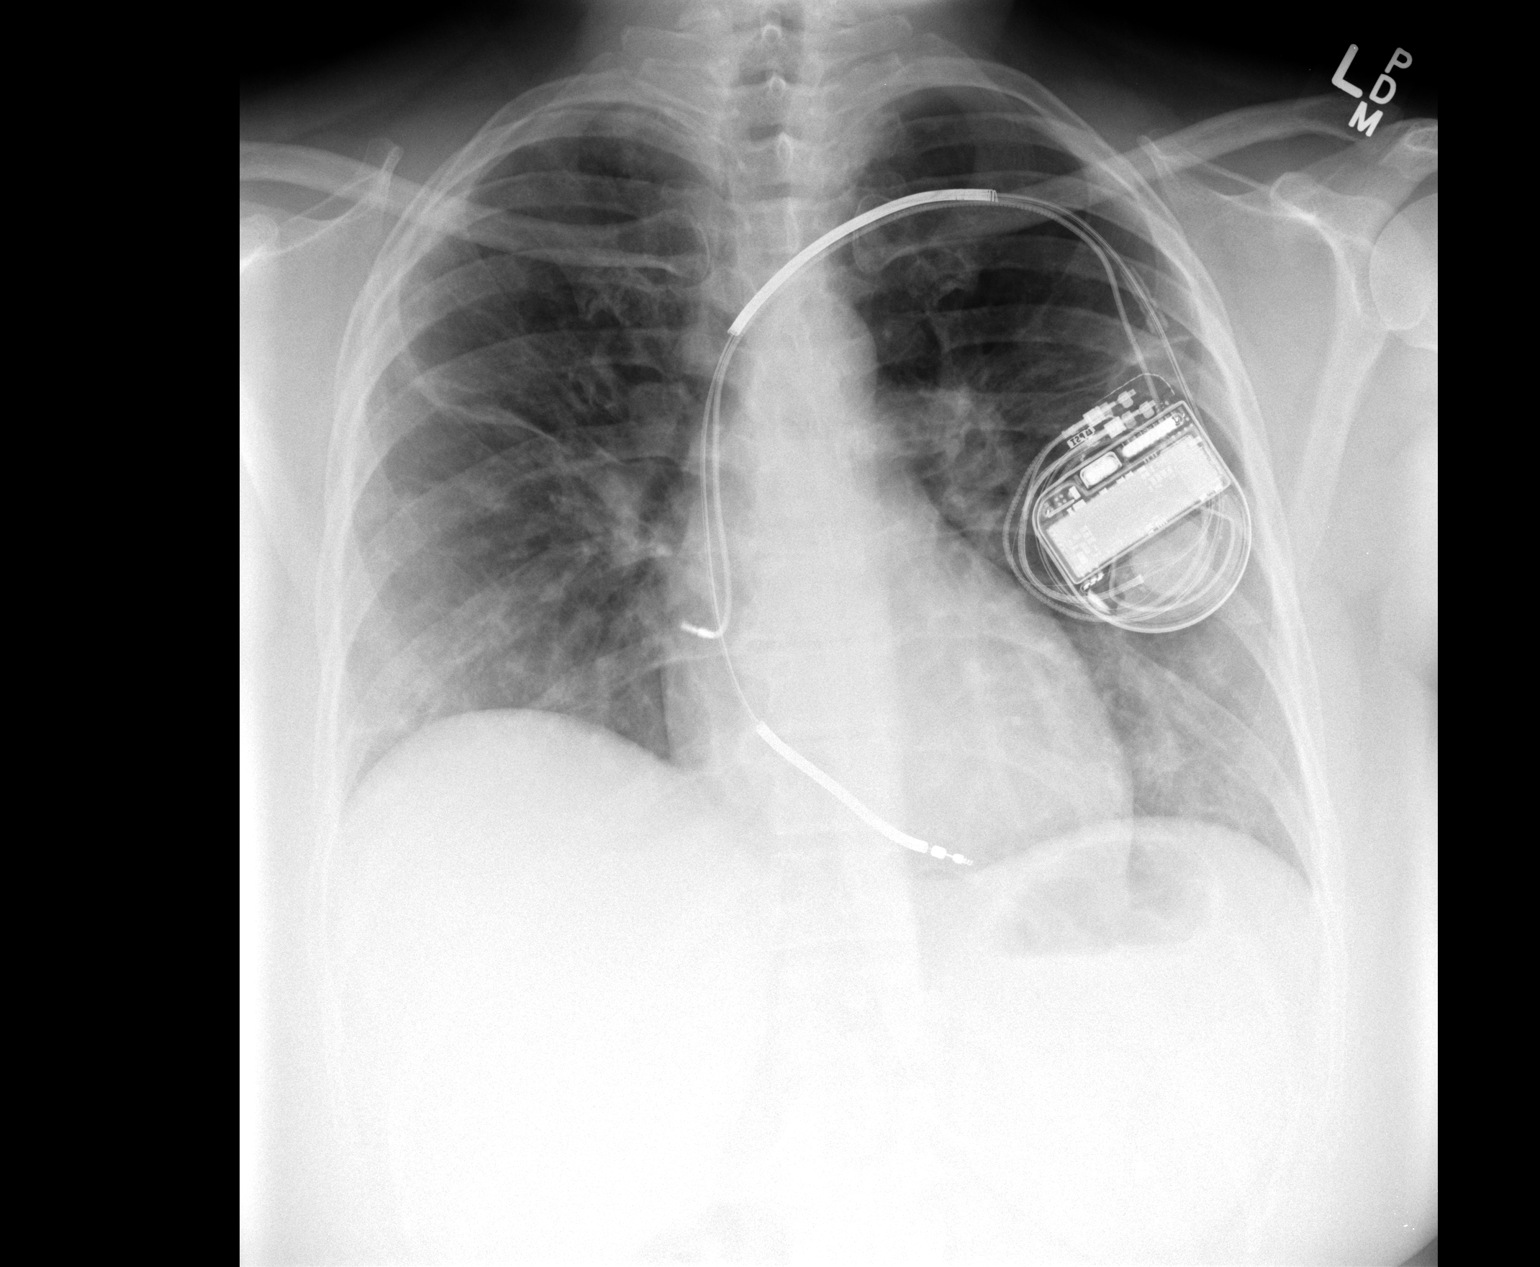

[view not recorded (2 of 2)]
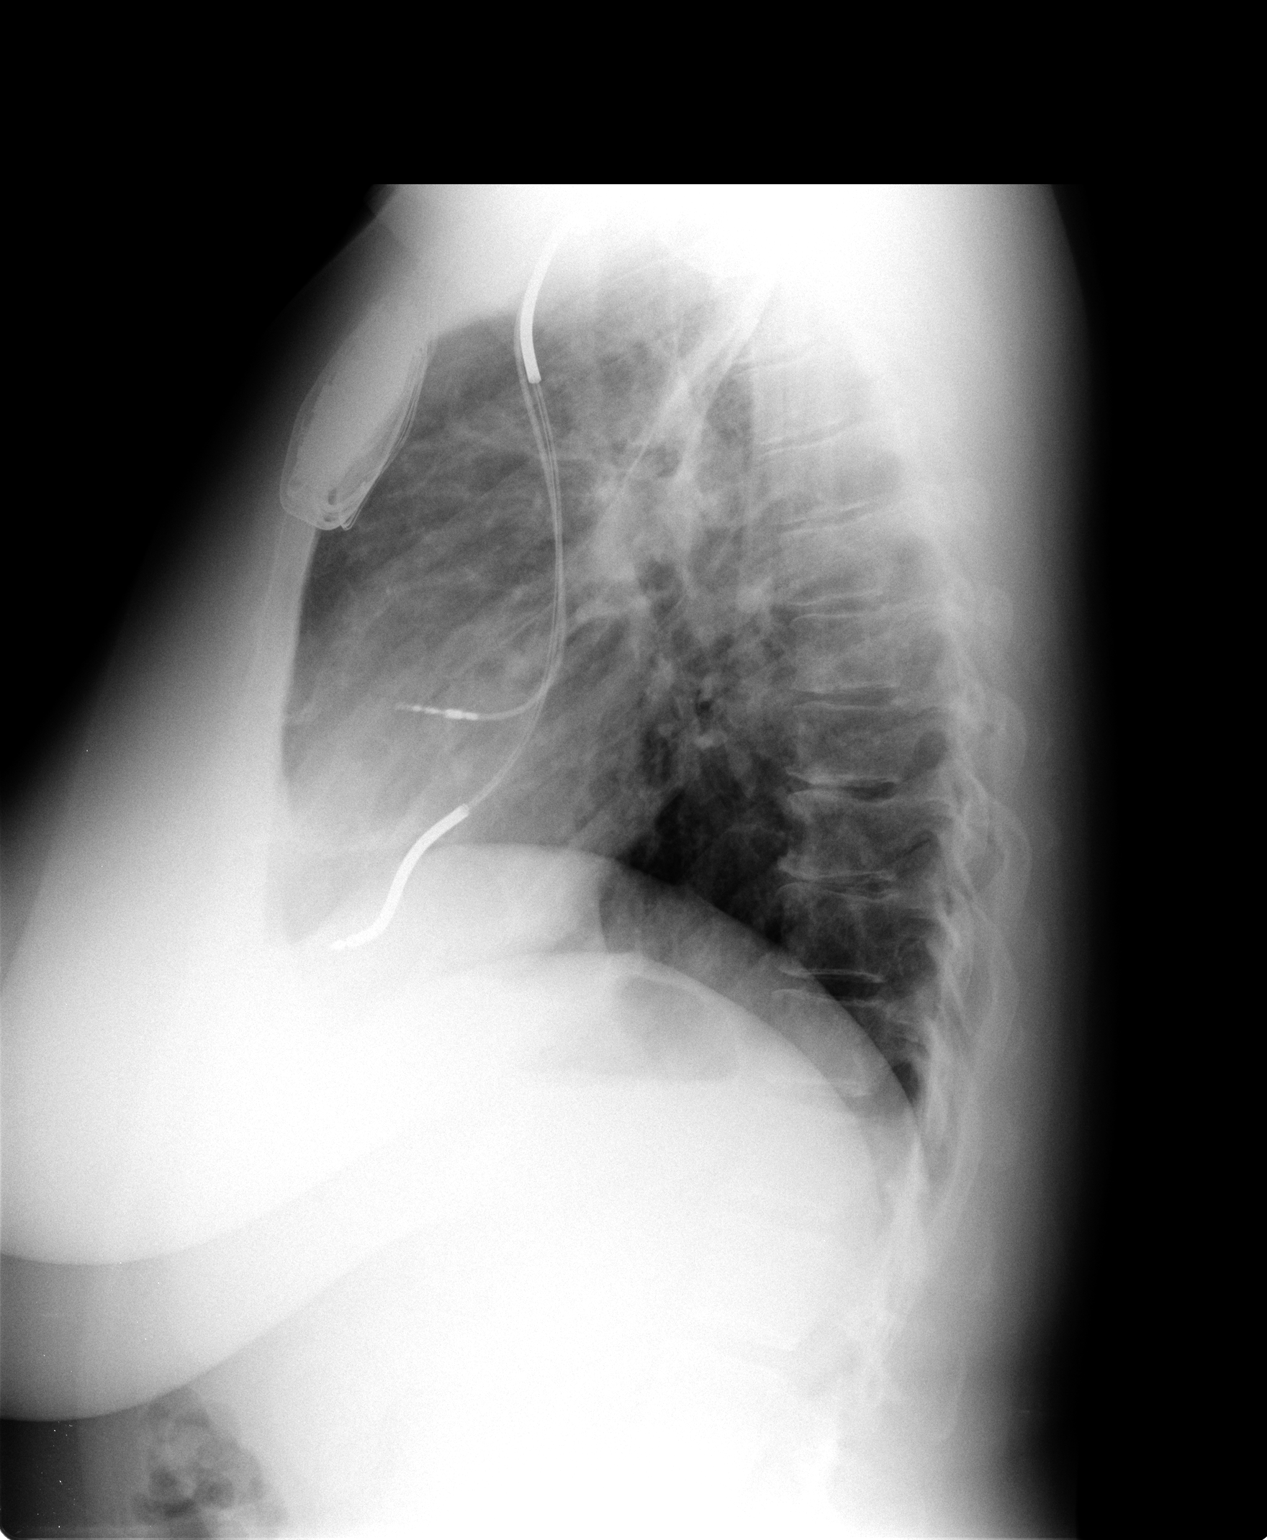

[2 of 2 positions shown; findings below may reference images not displayed]

FINDINGS: Dual lead left-sided pacemaker is unchanged. Lungs are adequately
inflated and demonstrate linear scarring over the mid to upper lungs
bilaterally. There is subtle patchy nodular opacification over the
mid to lower lungs slightly worse compared to the prior chest
radiograph as similar findings were described on patient's recent
abdominal CT. No evidence of effusion. Cardiomediastinal silhouette
and remainder of the exam is unchanged.
IMPRESSION: Subtle patchy nodular opacification over the mid to lower lungs
slightly more prominent compared to the prior chest radiograph,
although described on patient's recent abdominal CT. May be due to
patient's known sarcoidosis versus infection. Recommend followup
chest CT in 2-3 weeks.

## 2015-04-12 ENCOUNTER — Ambulatory Visit (INDEPENDENT_AMBULATORY_CARE_PROVIDER_SITE_OTHER): Payer: BC Managed Care – PPO | Admitting: Emergency Medicine

## 2015-04-12 VITALS — BP 136/76 | HR 98 | Temp 98.0°F | Resp 18 | Ht 65.0 in | Wt 227.0 lb

## 2015-04-12 DIAGNOSIS — M5489 Other dorsalgia: Secondary | ICD-10-CM | POA: Diagnosis not present

## 2015-04-12 MED ORDER — TRAMADOL HCL 50 MG PO TABS: 50.0000 mg | ORAL_TABLET | Freq: Three times a day (TID) | ORAL | Status: DC | PRN

## 2015-04-12 MED ORDER — CYCLOBENZAPRINE HCL 10 MG PO TABS: 10.0000 mg | ORAL_TABLET | Freq: Three times a day (TID) | ORAL | Status: DC | PRN

## 2015-04-12 MED ORDER — NAPROXEN SODIUM 550 MG PO TABS: 550.0000 mg | ORAL_TABLET | Freq: Two times a day (BID) | ORAL | Status: DC

## 2015-04-12 NOTE — Patient Instructions (Signed)

## 2015-04-12 NOTE — Progress Notes (Signed)
[facility] [address] [place] [address] [phone]  Date:  04/12/2015   Name:  Alexandra Hood   DOB:  Dec 30, 1970   MRN:  [id number]  PCP:  [name], MD    Chief Complaint: Neck Pain; Shoulder Pain; and Back Pain   History of Present Illness:  Alexandra Hood is a 44 y.o. very pleasant female patient who presents with the following:  Patient has a week duration pain in shoulders, neck and low back No history of injury.  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   No neuro symptoms.   No prior back pain. No improvement with OTC meds Worse when sits. No improvement with over the counter medications or other home remedies.  Denies other complaint or health concern today.    Patient Active Problem List   Diagnosis Date Noted  . Multiple pulmonary nodules 08/24/2014  . 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 Sinus tachycardia 10/23/2013  . T wave over sensing 10/23/2013  . 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 Thrombocytosis 03/06/2012  . Obesity 03/06/2012  . Pre-diabetes 03/06/2012  . 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 Keloid--ICD incision 04/25/2011  . 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 DEPRESSION 02/15/2011  . Unspecified vitamin D deficiency 01/20/2011  . VENTRICULAR TACHYCARDIA 01/20/2011  . Sarcoidosis 05/12/2009  . PSEUDOTUMOR CEREBRI 05/12/2009    Past Medical History  Diagnosis Date  . Depression   . Pertussis   . Pseudotumor cerebri     .Marland KitchenMarland KitchenDr Sabra Heck and Dr. Katy Fitch - dxed Jan 2009, on diamox  - LP 01/24/2008 at [facility] - Normal ( pressure 14cm, gluc 68, Protein 23, Clear, colorless, RBC 3, WBC - too few to count)  - CT head non contrast done at [facility] 11/25/2007: "normal". Ddoes not report MRI, MRA, MRV being done - 2nd opinon by Dr. Leonie Man - reportdly had MRI/MRV both normal; followup pending  . Lupus pernio     -skin bx confirmed at [facility] 05/07/2009  (personally reviewed) -looks improved 07/09/2009 -  . Pulmonary sarcoidosis     - Arlyce Harman 07/25/2010 on pred 10/mtx 10 -> FEv1 1.84L/775%, FVC 2.25L - SPiro 09/06/2010 -> Fev1 2.14L/81%. Cut pred to 5mg  per day. Cont mtx - SPiro 01/20/2011 - Fev1 1.6L/61%, Ratio 74%. CXR Worse -> increased pred to 10mg  perday. Cont Mtx.    . Vitamin D deficiency     - Aug 2011: 22 and low -> commence Rx. - Jan 2012: 11  . Ventricular tachycardia     Past Surgical History  Procedure Laterality Date  . 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 Cardiac defibrillator placement      [employer]    History  Substance Use Topics  . Smoking status: Former Smoker -- 1.00 packs/day for 15 years    Types: Cigarettes    Quit date: 03/03/2007  . Smokeless tobacco: Not on file  . Alcohol Use: No    Family History  Problem Relation Age of Onset  . Allergies Mother   . Diabetes Mother   . Hypertension Mother   . Hyperlipidemia Mother   . Hyperlipidemia Father   . Hypertension Father   . Hyperlipidemia Sister   . COPD Maternal Grandfather   . Leukemia Paternal Grandmother   . Heart attack Paternal Grandfather     No Known Allergies  Medication list has been reviewed and updated.  Current Outpatient Prescriptions on File Prior to Visit  Medication Sig Dispense  Refill  . acetaminophen (TYLENOL) 500 MG tablet Take 1,000 mg by mouth 3 (three) times daily as needed (pain).    Marland Kitchen acetaZOLAMIDE (DIAMOX) 250 MG tablet Take 2 tablets (500 mg total) by mouth 2 (two) times daily. 120 tablet 1  . beclomethasone (QVAR) 80 MCG/ACT inhaler Inhale 2 puffs into the lungs 2 (two) times daily. 1 Inhaler 6  . methylPREDNISolone (MEDROL) 4 MG tablet Take 1 tablet (4 mg total) by mouth daily. Continuous course until stopped by MD 30 tablet 5  . methylPREDNISolone (MEDROL) 4 MG tablet Take 1 tablet (4 mg total) by mouth daily. In addition to the current baseline dose 5 tablet 0  . metoprolol succinate (TOPROL-XL) 25 MG 24 hr tablet  Take 1 tablet (25 mg total) by mouth daily. 30 tablet 6  . topiramate (TOPAMAX) 25 MG tablet Take 25 mg by mouth at bedtime.     No current facility-administered medications on file prior to visit.    Review of Systems:  As per HPI, otherwise negative.    Physical Examination: Filed Vitals:   04/12/15 1730  BP: 136/76  Pulse: 98  Temp: 98 F (36.7 C)  Resp: 18   Filed Vitals:   04/12/15 1730  Height: 5\' 5"  (1.651 m)  Weight: 227 lb (102.967 kg)   Body mass index is 37.77 kg/(m^2). Ideal Body Weight: Weight in (lb) to have BMI = 25: 149.9   GEN: WDWN, NAD, Non-toxic, Alert & Oriented x 3 HEENT: Atraumatic, Normocephalic.  Ears and Nose: No external deformity. EXTR: No clubbing/cyanosis/edema NEURO: Normal gait.  PSYCH: Normally interactive. Conversant. Not depressed or anxious appearing.  Calm demeanor.  Bilateral trapezius tenderness.  Neuro and motor intact Lumbar tenderness.   Assessment and Plan: Back strain Anaprox Flexeril Ultram  Signed,  Ellison Carwin, MD

## 2015-04-13 NOTE — Telephone Encounter (Signed)
Let her know that the Inhaled steroid strategy we started in Jan 2016 has really helped. PFT end march 2016: has nearly normalized.  Plan co0ntinue current regimen without change including ICS ROV 6 months - will do office spirometry at that time  Please express my apologies for delay - my scheduled + PFt workflow from breeze to epic

## 2015-04-13 NOTE — Telephone Encounter (Signed)
Pt returned call  Please call back on [phone] ext [phone]

## 2015-04-13 NOTE — Telephone Encounter (Signed)
Pt aware of results per MR Aware of follow up needed in 6 months. Recall entered.  Nothing further needed.

## 2015-04-13 NOTE — Telephone Encounter (Signed)
MR please advise if this message can be closed.  Thanks! 

## 2015-05-31 ENCOUNTER — Ambulatory Visit (INDEPENDENT_AMBULATORY_CARE_PROVIDER_SITE_OTHER): Payer: BC Managed Care – PPO | Admitting: Physician Assistant

## 2015-05-31 VITALS — BP 140/100 | HR 111 | Temp 98.7°F | Resp 20 | Ht 65.0 in | Wt 220.0 lb

## 2015-05-31 DIAGNOSIS — R0789 Other chest pain: Secondary | ICD-10-CM

## 2015-05-31 DIAGNOSIS — R42 Dizziness and giddiness: Secondary | ICD-10-CM

## 2015-05-31 DIAGNOSIS — R0602 Shortness of breath: Secondary | ICD-10-CM | POA: Diagnosis not present

## 2015-05-31 NOTE — Patient Instructions (Signed)
Your exam and EKG were reassuring today, nothing concerning.  Please try to take it easy the next couple days with the heat and humidity.  If the chest pressure gets worse please go be seen ASAP. If it does not go away in the next 1-2 days please let your cardiology team know.  Be sure to continue to follow with your specialists as previously scheduled.

## 2015-05-31 NOTE — Progress Notes (Signed)
Subjective:    Patient ID: Alexandra Hood, female    DOB: 07-22-1971, 44 y.o.   MRN: [id number]  Chief Complaint  Patient presents with  . 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 Dizziness    states she feels a little lightheaded  . Migraine    started on yesterday  . Nausea    associated with the migraine   Patient Active Problem List   Diagnosis Date Noted  . Multiple pulmonary nodules 08/24/2014  . 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 Sinus tachycardia 10/23/2013  . T wave over sensing 10/23/2013  . 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 Thrombocytosis 03/06/2012  . Obesity 03/06/2012  . Pre-diabetes 03/06/2012  . 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 Keloid--ICD incision 04/25/2011  . 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 DEPRESSION 02/15/2011  . Unspecified vitamin D deficiency 01/20/2011  . VENTRICULAR TACHYCARDIA 01/20/2011  . Sarcoidosis 05/12/2009  . 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 Authorizing Provider  acetaminophen (TYLENOL) 500 MG tablet Take 1,000 mg by mouth 3 (three) times daily as needed (pain).   Yes Historical Provider, MD  acetaZOLAMIDE (DIAMOX) 250 MG tablet Take 2 tablets (500 mg total) by mouth 2 (two) times daily. 09/24/13  Yes Roselee Culver, MD  beclomethasone (QVAR) 80 MCG/ACT inhaler Inhale 2 puffs into the lungs 2 (two) times daily. 02/11/15  Yes Brand Males, MD  methylPREDNISolone (MEDROL) 4 MG tablet Take 1 tablet (4 mg total) by mouth daily. Continuous course until stopped by MD 01/22/15  Yes Brand Males, MD  metoprolol succinate (TOPROL-XL) 25 MG 24 hr tablet Take 1 tablet (25 mg total) by mouth daily. 04/01/15  Yes Deboraha Sprang, MD  topiramate (TOPAMAX) 25 MG tablet Take 25 mg by mouth at bedtime.   Yes Historical Provider, MD   Medications,  allergies, past medical history, surgical history, family history, social history and problem list reviewed and updated.  HPI  39 yof with pmh pulmonary and cardiac sarcoidosis presents with sob and chest tightness.   Sees specialists regularly for both of these issues. 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 The building was warm and stuffy. While she was in there started feeling sob. Had to step out of building, went back in and still sob. Started having a coughing fit with this as she does sometimes when she is sob. Then started to have some mild SSCP described as pressure. Approx 5/10. No radiation. No assoc diaphoresis, n/v.   Got into car to drive here and felt somewhat dizzy while driving. Able to make it here.   States everything feels slightly better now that she is here in clinic. Dizziness still mildly present but improved. Breathing has improved. Chest pressure still present 5/10.   Denies any other recent cp episodes. She exercises several days week and has never had exertional cp.   She has had episodes like this before when she gets into hot, tough to breather environments. She felt this time was slightly worse than normal.   Denies cough, fevers, chills, unilateral leg pain/swelling. HR elevated initially, recheck 96 bpm. PERC negative with normal HR.   Review of Systems See HPI.     Objective:   Physical Exam  Constitutional: She is oriented to person, place, and time. She appears well-developed  and well-nourished.  Non-toxic appearance. She does not have a sickly appearance. She does not appear ill. No distress.  BP 140/100 mmHg  Pulse 111  Temp(Src) 98.7 F (37.1 C) (Oral)  Resp 20  Ht 5\' 5"  (1.651 m)  Wt 220 lb (99.791 kg)  BMI 36.61 kg/m2  SpO2 98%   Cardiovascular: Normal rate, regular rhythm and normal heart sounds.  Exam reveals no gallop.   No murmur heard. Pulmonary/Chest: Effort normal and breath sounds normal. No  tachypnea. She has no decreased breath sounds. She has no wheezes. She has no rhonchi. She has no rales. She exhibits tenderness.    Chest wall ttp over area where pt experiencing chest tightness.   Neurological: She is alert and oriented to person, place, and time. She has normal strength. No cranial nerve deficit or sensory deficit. She displays a negative Romberg sign.  Normal rapid alternating movements. Normal finger to nose.   Psychiatric: She has a normal mood and affect. Her speech is normal and behavior is normal.   HR recheck 96 bpm. BP recheck 138/96.   EKG read by Dr Elder Cyphers. Findings: Normal     Assessment & Plan:   82 yof with pmh pulmonary and cardiac sarcoidosis presents with sob and chest tightness.   Chest pressure - Plan: EKG 12-Lead SOB (shortness of breath) - Plan: EKG 12-Lead Dizzy --ekg with no concerning findings --heart/lung exam normal, vitals normal upon recheck --sob and dizziness improved once in A/C building, chest pressure still present but reproducible with palpation.  --PERC neg, no hx exertional cp to suggest coronary etiology  --suspect became sob and concerned in stuffy school, feeling better in here --work note for today and tomo, stay home in [facility], take it easy --follow with cardiology and pulm as scheduled --ER if chest pressure worsens or comes on with activity  Julieta Gutting, PA-C Physician Assistant-Certified [facility]  05/31/2015 1:22 PM

## 2015-06-24 ENCOUNTER — Ambulatory Visit (INDEPENDENT_AMBULATORY_CARE_PROVIDER_SITE_OTHER): Payer: BC Managed Care – PPO | Admitting: Family Medicine

## 2015-06-24 VITALS — BP 130/80 | HR 108 | Temp 99.1°F | Resp 16 | Ht 65.0 in | Wt 223.0 lb

## 2015-06-24 DIAGNOSIS — R110 Nausea: Secondary | ICD-10-CM

## 2015-06-24 DIAGNOSIS — R350 Frequency of micturition: Secondary | ICD-10-CM

## 2015-06-24 DIAGNOSIS — M545 Low back pain, unspecified: Secondary | ICD-10-CM

## 2015-06-24 LAB — POCT UA - MICROSCOPIC ONLY
CASTS, UR, LPF, POC: NEGATIVE
Crystals, Ur, HPF, POC: NEGATIVE
MUCUS UA: NEGATIVE
YEAST UA: NEGATIVE

## 2015-06-24 LAB — POCT URINALYSIS DIPSTICK
Bilirubin, UA: NEGATIVE
GLUCOSE UA: NEGATIVE
KETONES UA: NEGATIVE
LEUKOCYTES UA: NEGATIVE
Nitrite, UA: NEGATIVE
PH UA: 6
PROTEIN UA: NEGATIVE
Spec Grav, UA: <=1.005
Urobilinogen, UA: 0.2

## 2015-06-24 MED ORDER — IBUPROFEN 200 MG PO TABS
400.0000 mg | ORAL_TABLET | Freq: Once | ORAL | Status: AC
  Administered 2015-06-24: 400 mg via ORAL

## 2015-06-24 MED ORDER — SULFAMETHOXAZOLE-TRIMETHOPRIM 800-160 MG PO TABS: 1.0000 | ORAL_TABLET | Freq: Two times a day (BID) | ORAL | Status: DC

## 2015-06-24 MED ORDER — ONDANSETRON 4 MG PO TBDP: 4.0000 mg | ORAL_TABLET | Freq: Three times a day (TID) | ORAL | Status: DC | PRN

## 2015-06-24 MED ORDER — ONDANSETRON 4 MG PO TBDP
8.0000 mg | ORAL_TABLET | Freq: Once | ORAL | Status: AC
  Administered 2015-06-24: 8 mg via ORAL

## 2015-06-24 NOTE — Patient Instructions (Signed)
Continue to drink lots of fluids Come back in if you have nausea and vomiting that won't stop or fever greater than 101

## 2015-06-24 NOTE — Progress Notes (Signed)
Subjective:    Patient ID: Alexandra Hood, female    DOB: 23-Mar-1971, 44 y.o.   MRN: [id number]  HPI Patient presents today with 3 days of right sided back pain, frequent urination, nausea, vomiting x 2 (yesterday).  Has not been sexually for 3 months.   Patient reports similar symptoms previously. Reviewed chart and patient was seen 5/15 with similar symptoms, treated for UTI. Had some continued pain and had abdominal CT that showed possible pneumonia. She was treated with antibiotics with resolution. She reports this pain is similar with more radiation to her back. The pain into her back is more constant than intermittent and hurts if she lies on her left side.   Past Medical History  Diagnosis Date  . Depression   . Pertussis   . Pseudotumor cerebri     .Marland KitchenMarland KitchenDr Sabra Heck and Dr. Katy Fitch - dxed Jan 2009, on diamox  - LP 01/24/2008 at [facility] - Normal ( pressure 14cm, gluc 68, Protein 23, Clear, colorless, RBC 3, WBC - too few to count)  - CT head non contrast done at [facility] 11/25/2007: "normal". Ddoes not report MRI, MRA, MRV being done - 2nd opinon by Dr. Leonie Man - reportdly had MRI/MRV both normal; followup pending  . Lupus pernio     -skin bx confirmed at [facility] 05/07/2009 (personally reviewed) -looks improved 07/09/2009 -  . Pulmonary sarcoidosis     - Arlyce Harman 07/25/2010 on pred 10/mtx 10 -> FEv1 1.84L/775%, FVC 2.25L - SPiro 09/06/2010 -> Fev1 2.14L/81%. Cut pred to 5mg  per day. Cont mtx - SPiro 01/20/2011 - Fev1 1.6L/61%, Ratio 74%. CXR Worse -> increased pred to 10mg  perday. Cont Mtx.    . Vitamin D deficiency     - Aug 2011: 22 and low -> commence Rx. - Jan 2012: 11  . Ventricular tachycardia    Past Surgical History  Procedure Laterality Date  . 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 Cardiac defibrillator placement      [employer]   Family History  Problem Relation Age of Onset  . Allergies Mother   . Diabetes Mother   . Hypertension Mother   .  Hyperlipidemia Mother   . Hyperlipidemia Father   . Hypertension Father   . Hyperlipidemia Sister   . COPD Maternal Grandfather   . Leukemia Paternal Grandmother   . Heart attack Paternal Grandfather    History  Substance Use Topics  . Smoking status: Former Smoker -- 1.00 packs/day for 15 years    Types: Cigarettes    Quit date: 03/03/2007  . Smokeless tobacco: Not on file  . Alcohol Use: No     Review of Systems Urinary frequency, fever to 99/ chills, no vaginal symptoms, no dysuria.    Objective:   Physical Exam  Constitutional: She is oriented to person, place, and time. She appears well-developed and well-nourished. No distress.  HENT:  Head: Normocephalic and atraumatic.  Neck: Normal range of motion. Neck supple.  Cardiovascular: Regular rhythm and normal heart sounds.  Tachycardia present.   Pulmonary/Chest: Effort normal and breath sounds normal. oh Abdominal: Soft. Bowel sounds are normal. She exhibits no distension and no mass. There is tenderness (mild tenderness RUQ, LUQ with palpation). There is CVA tenderness (right>left). There is no rebound and no guarding.  Musculoskeletal: Normal range of motion.  Neurological: She is alert and oriented to person, place, and time.  Skin: She is not diaphoretic.  Vitals reviewed.  BP 130/80 mmHg  Pulse 108  Temp(Src) 99.1 F (37.3 C) (Oral)  Resp 16  Ht 5\' 5"  (1.651 m)  Wt 223 lb (101.152 kg)  BMI 37.11 kg/m2  SpO2 96%  Patient given ibuprofen 400 mg and zofran 8 mg with some improvement in her symptoms. She did not exhibit any vomiting while in clinic.   Results for orders placed or performed in visit on 06/24/15  POCT urinalysis dipstick  Result Value Ref Range   Color, UA yellow    Clarity, UA clear    Glucose, UA neg    Bilirubin, UA neg    Ketones, UA neg    Spec Grav, UA <=1.005    Blood, UA trace-lysed    pH, UA 6.0    Protein, UA neg    Urobilinogen, UA 0.2    Nitrite, UA neg    Leukocytes, UA  Negative Negative  POCT UA - Microscopic Only  Result Value Ref Range   WBC, Ur, HPF, POC 2-3    RBC, urine, microscopic 0-1    Bacteria, U Microscopic few    Mucus, UA neg    Epithelial cells, urine per micros 5-10    Crystals, Ur, HPF, POC neg    Casts, Ur, LPF, POC neg    Yeast, UA neg        Assessment & Plan:  1. Frequent urination - POCT urinalysis dipstick - POCT UA - Microscopic Only - Urine culture - sulfamethoxazole-trimethoprim (BACTRIM DS,SEPTRA DS) 800-160 MG per tablet; Take 1 tablet by mouth 2 (two) times daily.  Dispense: 20 tablet; Refill: 0  2. Nausea - ondansetron (ZOFRAN-ODT) disintegrating tablet 8 mg; Take 2 tablets (8 mg total) by mouth once. - ondansetron (ZOFRAN ODT) 4 MG disintegrating tablet; Take 1 tablet (4 mg total) by mouth every 8 (eight) hours as needed for nausea or vomiting.  Dispense: 20 tablet; Refill: 0  3. Right-sided low back pain without sciatica - ibuprofen (ADVIL,MOTRIN) tablet 400 mg; Take 2 tablets (400 mg total) by mouth once.   Clarene Reamer, FNP-BC  [facility]  06/24/2015 6:28 PM

## 2015-06-25 LAB — URINE CULTURE
Colony Count: NO GROWTH
ORGANISM ID, BACTERIA: NO GROWTH

## 2015-08-20 ENCOUNTER — Encounter: Payer: Self-pay | Admitting: *Deleted

## 2015-08-26 ENCOUNTER — Encounter: Payer: Self-pay | Admitting: Internal Medicine

## 2015-11-12 ENCOUNTER — Encounter: Payer: Self-pay | Admitting: Internal Medicine

## 2015-11-12 ENCOUNTER — Ambulatory Visit (INDEPENDENT_AMBULATORY_CARE_PROVIDER_SITE_OTHER): Payer: BC Managed Care – PPO | Admitting: Internal Medicine

## 2015-11-12 ENCOUNTER — Ambulatory Visit (INDEPENDENT_AMBULATORY_CARE_PROVIDER_SITE_OTHER)
Admission: RE | Admit: 2015-11-12 | Discharge: 2015-11-12 | Disposition: A | Discharge status: Home / Self Care | Payer: BC Managed Care – PPO | Source: Ambulatory Visit | Attending: Internal Medicine | Admitting: Internal Medicine

## 2015-11-12 VITALS — BP 132/88 | HR 96 | Ht 65.0 in | Wt 226.0 lb

## 2015-11-12 DIAGNOSIS — Z23 Encounter for immunization: Secondary | ICD-10-CM | POA: Diagnosis not present

## 2015-11-12 DIAGNOSIS — D860 Sarcoidosis of lung: Secondary | ICD-10-CM

## 2015-11-12 NOTE — Patient Instructions (Signed)
ICD-9-CM ICD-10-CM   1. Pulmonary sarcoidosis ([place]) 135 D86.0 Spirometry with Graph   517.8      Sarcoid appears stable but still with lung funcation at 67% though asymptomatic  PLAN - cxr 2 view - wil lcall with results and decide on restart methotrexate - continue medrol 4mg  daily for now - flu shot 11/12/2015   - prevnar vaccine 11/12/2015  Followup  - depending on cXR results

## 2015-11-12 NOTE — Progress Notes (Signed)
Subjective:     Patient ID: Alexandra Hood, female   DOB: 29-Nov-1971, 44 y.o.   MRN: [id number]  HPI  HPI   OV 07/06/2014  Chief Complaint  Patient presents with  . Follow-up    Pt states her breathing has slightly improved since last OV, pt is able to do more activities. Pt c/o RUQ pain with inspiration. Denies cough and CP/tightness. PT states she has DVT in left lower leg that pt stated she noticed last week.    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 I cut the Medrol to 2 mg once daily. Then on 05/22/2014 she went to the emergency room with abdominal pain right upper quadrant. Apparently this time she did have some fever low grade along with some yellow sputum, right pleuritic chest pain and shortness of breath. CT scan of the abdomen did show some right lower lobe pulmonary infiltrates and she was given Z-Pak. After these all the symptoms resolved except for the right pleuritic chest pain which has largely improved although still persistent and is mild. She did followup with my nurse practitioner 05/28/2014 and had a chest x-ray that shows prominence in the right lower lobe infiltrates suggesting the presence of pneumonia. She had ACEl checked which was 80 and elevated. She's been asked to take Medrol at 4 mg once daily which is doing currently . Overall she is well . Spirometry today is baseline with an FVC of 2.3 the/78%. FEV1 of 1.88 L or 74% and a ratio of 80/96%.   OF note a few days ago she did notice that both calves swelled up and then spontaneously settled. She thinks she had a "blood clot" was no worsening dyspnea or cough.    #sarcoidosis  - continue medrol at 4mg  per day  #Pneumoia Right lower lobe 05/22/14  - glad you are better but we need to keep an eye on the right sided pain  - if pain gets worse call us  - Do CT chest wo contrast mid-end Aug 2015  #Followup -  - Do CT chest wo  contrast mid-end Aug 2015 - return to see me or my NP after CT   OV 08/17/2014  Chief Complaint  Patient presents with  . Follow-up    review CT results. Pt denies any increase in SOB, cough, chest tightness.    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 But she had "pneumonia" and some chest pain in spring/summer. So we decided to get CT chest aug 2015. CT shows improvement in UL sarcoid and down to a scar compared to 2010 but lower lobe she has worsening nodular disease. She herself is asymptomatic and feeling well and more keen on tapering off steroids. Denies new problems. 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  However, there are multiple peribronchovascular areas of ground glass and nodularity, in a perilymphatic distribution, new from 2010 but seen on CT AP 05/22/14 (lung bases).  Looks to me like sarcoid progression.  Atypical infection is considered less likely.   OV 01/22/2015 Chief Complaint  Patient presents with  . Follow-up    Pt stated her breathing has improved since last OV. Pt stated her SOB has  improved and she does not become as SOB as before. Pt denies cough and CP/tightness.    44 year old female with pulmonary sarcoidosis stage II with ACD placement  I'm known her for several years. She used to be on methotrexate. 3 years ago she had normal function and on high-dose of steroids. Last time I saw her was in August 2015 at which time she was asymptomatic and was expressing a strong desire for coming off steroids. However CT scan of the chest at that time showed that she had new worsening sarcoidosis in terms of nodules in the lower lobes even though the upper lobe sarcoidosis: Original problem was improved to just a scar tissue. Currently she  tells me she feels the best ever while she is being maintained on Medrol 4 mg per day [she does not like prednisone]. She's asymptomatic. She had a CT scan of the chest in December 2015 that shows stability compared to August 2015 but is definitely worse compared to 2010. We did a spirometry today and it shows continued decline of lung function with an FEV1 of 1.6 L/66%. She is very surprised by this the technique was good and she is asymptomatic. She is really reluctant to go up on the steroids add methotrexate again  OV 11/12/2015  Chief Complaint  Patient presents with  . Follow-up    Pt states she has had a dry cough recently, pt thinks this is due to the [place] air. Pt denies SOB and CP/tightness.    Follow-up pulmonary sarcoidosis stage II with AICD placement  Last seen January 2016. She is now maintained on Medrol 4 mg daily. In January 2016 FEV1 was 1.6 L/66%. She was asymptomatic at that time. Even at that time she was off methotrexate. There was concern for worsening sarcoidosis but after discussion we adopted a weight and watch approach. She currently continues Medrol 4 mg per day. She tells me she continues to be asymptomatic. Spirometry today shows FEV1 1.7 L/67%. There is similar to January 2016. She is a little bit worried about the obstruction in lung function. She is open to re-challenging herself with methotrexate though she does not want to go up on steroids. She wants have a flu shot and pneumonia shot today she continues to work at [employer] in [place]    Current outpatient prescriptions:  .  acetaminophen (TYLENOL) 500 MG tablet, Take 1,000 mg by mouth 3 (three) times daily as needed (pain)., Disp: , Rfl:  .  acetaZOLAMIDE (DIAMOX) 250 MG tablet, Take 2 tablets (500 mg total) by mouth 2 (two) times daily., Disp: 120 tablet, Rfl: 1 .  beclomethasone (QVAR) 80 MCG/ACT inhaler, Inhale 2 puffs into the lungs 2 (two) times daily., Disp: 1 Inhaler, Rfl: 6 .   methylPREDNISolone (MEDROL) 4 MG tablet, Take 1 tablet (4 mg total) by mouth daily. Continuous course until stopped by MD, Disp: 30 tablet, Rfl: 5 .  metoprolol succinate (TOPROL-XL) 25 MG 24 hr tablet, Take 1 tablet (25 mg total) by mouth daily., Disp: 30 tablet, Rfl: 6 .  topiramate (TOPAMAX) 25 MG tablet, Take 25 mg by mouth at bedtime., Disp: , Rfl:   No Known Allergies  Immunization History  Administered Date(s) Administered  . Influenza Split 09/24/2014  . Influenza Whole 09/29/2009, 09/26/2010, 09/25/2011  . Influenza,inj,Quad PF,36+ Mos 10/14/2013  . Pneumococcal-Unspecified 12/25/2009  . Td 02/10/2011      Review of Systems According to history of present illness    Objective:   Physical Exam  Constitutional: She is oriented to person, place, and time. She appears well-developed and well-nourished. No distress.  Sunglasses  HENT:  Head: Normocephalic and atraumatic.  Right Ear: External ear normal.  Left Ear: External ear normal.  Mouth/Throat: Oropharynx is clear and moist. No oropharyngeal exudate.  Eyes: Conjunctivae and EOM are normal. Pupils are equal, round, and reactive to light. Right eye exhibits no discharge. Left eye exhibits no discharge. No scleral icterus.  Neck: Normal range of motion. Neck supple. No JVD present. No tracheal deviation present. No thyromegaly present.  Cardiovascular: Normal rate, regular rhythm, normal heart sounds and intact distal pulses.  Exam reveals no gallop and no friction rub.   No murmur heard. Pulmonary/Chest: Effort normal and breath sounds normal. No respiratory distress. She has no wheezes. She has no rales. She exhibits no tenderness.  Abdominal: Soft. Bowel sounds are normal. She exhibits no distension and no mass. There is no tenderness. There is no rebound and no guarding.  Musculoskeletal: Normal range of motion. She exhibits no edema or tenderness.  Lymphadenopathy:    She has no cervical adenopathy.  Neurological: She  is alert and oriented to person, place, and time. She has normal reflexes. No cranial nerve deficit. She exhibits normal muscle tone. Coordination normal.  Skin: Skin is warm and dry. No rash noted. She is not diaphoretic. No erythema. No pallor.  No Lupus pernio  Psychiatric: She has a normal mood and affect. Her behavior is normal. Judgment and thought content normal.  Vitals reviewed.   Filed Vitals:   11/12/15 1702  BP: 132/88  Pulse: 96  Height: 5\' 5"  (1.651 m)  Weight: 226 lb (102.513 kg)  SpO2: 97%        Assessment:       ICD-9-CM ICD-10-CM   1. Pulmonary sarcoidosis ([place]) 135 D86.0 Spirometry with Graph   517.8  DG Chest 2 View       Plan:      Sarcoid appears stable but still with lung funcation at 67% though asymptomatic  PLAN - cxr 2 view - wil lcall with results and decide on restart methotrexate - continue medrol 4mg  daily for now - flu shot 11/12/2015   - prevnar vaccine 11/12/2015  Followup  - depending on cXR results    Dr. Brand Males, M.D., [facility].C.P Pulmonary and Critical Care Medicine Staff Physician [place] [facility] Pager: [phone], If no answer or between  15:00h - 7:00h: call [phone]  11/12/2015 5:24 PM

## 2015-11-15 ENCOUNTER — Telehealth: Payer: Self-pay | Admitting: Internal Medicine

## 2015-11-15 ENCOUNTER — Other Ambulatory Visit: Payer: Self-pay

## 2015-11-15 DIAGNOSIS — D860 Sarcoidosis of lung: Secondary | ICD-10-CM

## 2015-11-15 MED ORDER — SULFAMETHOXAZOLE-TRIMETHOPRIM 800-160 MG PO TABS: 1.0000 | ORAL_TABLET | ORAL | Status: DC

## 2015-11-15 MED ORDER — METHOTREXATE 2.5 MG PO TABS: 5.0000 mg | ORAL_TABLET | ORAL | Status: DC

## 2015-11-15 NOTE — Telephone Encounter (Signed)
Pt cb, requesting we leave detailed message on cell vm [phone]

## 2015-11-15 NOTE — Telephone Encounter (Signed)
lmtcb for pt.  

## 2015-11-15 NOTE — Telephone Encounter (Signed)
Patient returned call, she may be reached at [phone].

## 2015-11-15 NOTE — Telephone Encounter (Signed)
Spoke with pt, aware of results.  Pt wishes to proceed with low dose methotrexate regimen.  Reviewed dosing on methotrexate and bactrim.  These meds have been sent to pt's preferred pharmacy.  Pt also aware of labs needing to be drawn in December.  These have been ordered also.  Pt will look at her work schedule tomorrow and call back to schedule rov.    Nothing further needed at this time.

## 2015-11-15 NOTE — Telephone Encounter (Signed)
Pt calling back [phone] ext [phone]

## 2015-11-15 NOTE — Telephone Encounter (Signed)
cxr stable since April 2016. IF she wants to do low dose methotrexate 5mg  per week I am fine with it and we can follow in 6 months to see if lung function better. IF she says yes sent methotrexate 5mg  every Sunday (2 x 2.5mg  tab) + bactrim 1DS MWF + FU in 6 months  But will need CBC, BMET, LFT in 1 month  Thanks  Dr. Brand Males, M.D., [facility].C.P Pulmonary and Critical Care Medicine Staff Physician [place] [facility] Pager: [phone], If no answer or between  15:00h - 7:00h: call [phone]  11/15/2015 10:58 AM

## 2015-11-16 ENCOUNTER — Ambulatory Visit (INDEPENDENT_AMBULATORY_CARE_PROVIDER_SITE_OTHER): Payer: BC Managed Care – PPO | Admitting: Internal Medicine

## 2015-11-16 ENCOUNTER — Encounter: Payer: Self-pay | Admitting: Internal Medicine

## 2015-11-16 VITALS — BP 137/83 | HR 104 | Ht 65.0 in | Wt 226.4 lb

## 2015-11-16 DIAGNOSIS — Z95810 Presence of automatic (implantable) cardiac defibrillator: Secondary | ICD-10-CM | POA: Diagnosis not present

## 2015-11-16 DIAGNOSIS — I472 Ventricular tachycardia: Secondary | ICD-10-CM

## 2015-11-16 DIAGNOSIS — I429 Cardiomyopathy, unspecified: Secondary | ICD-10-CM

## 2015-11-16 DIAGNOSIS — I4729 Other ventricular tachycardia: Secondary | ICD-10-CM

## 2015-11-16 NOTE — Progress Notes (Signed)
Patient Care Team: Orma Flaming, MD as PCP - General (Internal Medicine)   HPI  Alexandra Hood is a 44 y.o. female Was seen in the emergency room after her device alarm because of the lead integrity alert. This turned out to be related to T wave oversensing. She unfortunately has very small amplitude R waves and it was their concern that there were no alternate ways of programming to mitigate this problem.  We extended the detection intervals  Previously we had increased sensitivity 0.3--0.6; however, the R-wave amplitude is less than 2 and a standard bipolar configuration  Her device was implanted for ventricular tachycardia occurring in the setting of known pulmonary sarcoid and MRI confirmed cardiac sarcoid.  No arrhythmia      Past Medical History  Diagnosis Date  . Pertussis   . Pseudotumor cerebri     .Marland KitchenMarland KitchenDr Sabra Heck and Dr. Katy Fitch - dxed Jan 2009, on diamox  - LP 01/24/2008 at [facility] - Normal ( pressure 14cm, gluc 68, Protein 23, Clear, colorless, RBC 3, WBC - too few to count)  - CT head non contrast done at [facility] 11/25/2007: "normal". Ddoes not report MRI, MRA, MRV being done - 2nd opinon by Dr. Leonie Man - reportdly had MRI/MRV both normal; followup pending  . 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 Pulmonary sarcoidosis ([place])     - Arlyce Harman 07/25/2010 on pred 10/mtx 10 -> FEv1 1.84L/775%, FVC 2.25L - SPiro 09/06/2010 -> Fev1 2.14L/81%. Cut pred to 5mg  per day. Cont mtx - SPiro 01/20/2011 - Fev1 1.6L/61%, Ratio 74%. CXR Worse -> increased pred to 10mg  perday. Cont Mtx.    . Vitamin D deficiency     - Aug 2011: 22 and low -> commence Rx. - Jan 2012: 11  . Ventricular tachycardia [facility])     Past Surgical History  Procedure Laterality Date  . 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 Cardiac defibrillator placement      [employer]tronic    Current Outpatient Prescriptions    Medication Sig Dispense Refill  . acetaminophen (TYLENOL) 500 MG tablet Take 1,000 mg by mouth 3 (three) times daily as needed (pain).    Marland Kitchen acetaZOLAMIDE (DIAMOX) 250 MG tablet Take 2 tablets (500 mg total) by mouth 2 (two) times daily. 120 tablet 1  . beclomethasone (QVAR) 80 MCG/ACT inhaler Inhale 2 puffs into the lungs 2 (two) times daily. 1 Inhaler 6  . methotrexate (RHEUMATREX) 2.5 MG tablet Take 2 tablets (5 mg total) by mouth once a week. Caution:Chemotherapy. Protect from light. 8 tablet 5  . methylPREDNISolone (MEDROL) 4 MG tablet Take 1 tablet (4 mg total) by mouth daily. Continuous course until stopped by MD 30 tablet 5  . metoprolol succinate (TOPROL-XL) 25 MG 24 hr tablet Take 1 tablet (25 mg total) by mouth daily. 30 tablet 6  . sulfamethoxazole-trimethoprim (BACTRIM DS,SEPTRA DS) 800-160 MG tablet Take 1 tablet by mouth 3 (three) times a week. 12 tablet 5  . topiramate (TOPAMAX) 25 MG tablet Take 25 mg by mouth at bedtime.     No current facility-administered medications for this visit.    No Known Allergies  Review of Systems negative except from HPI and PMH  Physical Exam BP 137/83 mmHg  Pulse 104  Ht 5\' 5"  (1.651 m)  Wt 226 lb 6.4 oz (102.694 kg)  BMI 37.67 kg/m2 Well  developed and well nourished in no acute distress HENT normal E scleral and icterus clear Neck Supple JVP flat; carotids brisk and full The patient's device was interrogated.  The information was reviewed. No changes were made in the programming.   Clear to ausculation  Regular rate and rhythm, no murmurs gallops or rub Soft with active bowel sounds No clubbing cyanosis  Edema Alert and oriented, grossly normal motor and sensory function Skin Warm and Dry  ECG 97  15/08/41  Assessment and  Plan  T wave oversensing-resolved   Ventricular tachycardia No intercurrent Ventricular tachycardia  Implantable  defibrillator-Medtronic  Cardiomyopathy-sarcoid  Lightheadedness-orthostasis  Improved  Without further TWave oversensing  Discussed lightheadedness and role of fluid repletion   f u one year

## 2015-11-25 LAB — CUP PACEART INCLINIC DEVICE CHECK
Brady Statistic AP VS Percent: 0.02 %
Brady Statistic AS VS Percent: 99.94 %
Brady Statistic RV Percent Paced: 0.04 %
HIGH POWER IMPEDANCE MEASURED VALUE: 44 Ohm
HighPow Impedance: 304 Ohm
HighPow Impedance: 57 Ohm
Implantable Lead Implant Date: 20120308
Implantable Lead Implant Date: 20120308
Implantable Lead Location: 753859
Lead Channel Impedance Value: 437 Ohm
Lead Channel Impedance Value: 456 Ohm
Lead Channel Pacing Threshold Pulse Width: 0.4 ms
Lead Channel Setting Pacing Amplitude: 2 V
Lead Channel Setting Pacing Amplitude: 2.5 V
Lead Channel Setting Pacing Pulse Width: 0.4 ms
MDC IDC LEAD LOCATION: 753860
MDC IDC MSMT BATTERY VOLTAGE: 2.99 V
MDC IDC MSMT LEADCHNL RA PACING THRESHOLD AMPLITUDE: 0.5 V
MDC IDC MSMT LEADCHNL RA PACING THRESHOLD PULSEWIDTH: 0.4 ms
MDC IDC MSMT LEADCHNL RA SENSING INTR AMPL: 3 mV
MDC IDC MSMT LEADCHNL RV PACING THRESHOLD AMPLITUDE: 0.5 V
MDC IDC MSMT LEADCHNL RV SENSING INTR AMPL: 5.375 mV
MDC IDC SESS DTM: 20161122213758
MDC IDC SET LEADCHNL RV SENSING SENSITIVITY: 0.45 mV
MDC IDC STAT BRADY AP VP PERCENT: 0 %
MDC IDC STAT BRADY AS VP PERCENT: 0.04 %
MDC IDC STAT BRADY RA PERCENT PACED: 0.02 %

## 2016-01-10 ENCOUNTER — Ambulatory Visit (INDEPENDENT_AMBULATORY_CARE_PROVIDER_SITE_OTHER): Payer: BC Managed Care – PPO | Admitting: Physician Assistant

## 2016-01-10 VITALS — BP 128/84 | HR 99 | Temp 98.2°F | Resp 16 | Ht 65.0 in | Wt 226.0 lb

## 2016-01-10 DIAGNOSIS — R1011 Right upper quadrant pain: Secondary | ICD-10-CM | POA: Diagnosis not present

## 2016-01-10 DIAGNOSIS — R8290 Unspecified abnormal findings in urine: Secondary | ICD-10-CM

## 2016-01-10 LAB — COMPREHENSIVE METABOLIC PANEL
ALBUMIN: 4.3 g/dL (ref 3.6–5.1)
ALK PHOS: 115 U/L (ref 33–115)
ALT: 31 U/L — AB (ref 6–29)
AST: 19 U/L (ref 10–30)
BILIRUBIN TOTAL: 0.3 mg/dL (ref 0.2–1.2)
BUN: 8 mg/dL (ref 7–25)
CHLORIDE: 99 mmol/L (ref 98–110)
CO2: 30 mmol/L (ref 20–31)
CREATININE: 0.61 mg/dL (ref 0.50–1.10)
Calcium: 9.8 mg/dL (ref 8.6–10.2)
Glucose, Bld: 94 mg/dL (ref 65–99)
Potassium: 4.4 mmol/L (ref 3.5–5.3)
SODIUM: 138 mmol/L (ref 135–146)
TOTAL PROTEIN: 7.6 g/dL (ref 6.1–8.1)

## 2016-01-10 LAB — POCT URINALYSIS DIP (MANUAL ENTRY)
Bilirubin, UA: NEGATIVE
GLUCOSE UA: NEGATIVE
Ketones, POC UA: NEGATIVE
LEUKOCYTES UA: NEGATIVE
Nitrite, UA: NEGATIVE
PROTEIN UA: NEGATIVE
SPEC GRAV UA: 1.025
UROBILINOGEN UA: 0.2
pH, UA: 5.5

## 2016-01-10 LAB — POCT CBC
GRANULOCYTE PERCENT: 71.9 % (ref 37–80)
HCT, POC: 39.6 % (ref 37.7–47.9)
HEMOGLOBIN: 13.4 g/dL (ref 12.2–16.2)
Lymph, poc: 2.2 (ref 0.6–3.4)
MCH: 27.7 pg (ref 27–31.2)
MCHC: 33.7 g/dL (ref 31.8–35.4)
MCV: 82.2 fL (ref 80–97)
MID (cbc): 0.6 (ref 0–0.9)
MPV: 7.3 fL (ref 0–99.8)
PLATELET COUNT, POC: 472 10*3/uL — AB (ref 142–424)
POC Granulocyte: 7.2 — AB (ref 2–6.9)
POC LYMPH PERCENT: 21.8 %L (ref 10–50)
POC MID %: 6.3 % (ref 0–12)
RBC: 4.82 M/uL (ref 4.04–5.48)
RDW, POC: 14.9 %
WBC: 10 10*3/uL (ref 4.6–10.2)

## 2016-01-10 LAB — POC MICROSCOPIC URINALYSIS (UMFC): Mucus: ABSENT

## 2016-01-10 LAB — LIPASE: LIPASE: 34 U/L (ref 7–60)

## 2016-01-10 MED ORDER — OMEPRAZOLE 20 MG PO CPDR: 20.0000 mg | DELAYED_RELEASE_CAPSULE | Freq: Every day | ORAL | Status: DC

## 2016-01-10 MED ORDER — SUCRALFATE 1 GM/10ML PO SUSP: 1.0000 g | Freq: Three times a day (TID) | ORAL | Status: DC

## 2016-01-10 NOTE — Progress Notes (Addendum)
[facility] [address] [place] [address] [phone]  Date:  01/10/2016   Name:  Alexandra Hood   DOB:  July 25, 1971   MRN:  [id number]  PCP:  Alexandra Portela, MD    Chief Complaint: Abdominal Pain; Flank Pain; and Back Pain   History of Present Illness:  This is a 45 y.o. 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 When first started she woke with mild upset stomach and felt nauseated. No vomiting. Had some loose stools. The following day the pain worsened. Pain mostly located to right side of abdomen. Feels like spasms and radiates to right lower back. Felt constipated for the next 2 days and only had small hard BMs. Today had normal BM. Feels pain is worse after eating, doesn't matter what she eats. She states in general she eats a low fat diet. Past 4 days has only been eating fruits and salads and makes pain much worse. 1.5 years ago she was here with similar symptoms. Labs all normal at that time. RUQ u/s only with mild fatty liver. Abdominal CT normal. She changed to a low fat diet and symptoms improved. She has had 4 episodes since that time but are self-limiting and hasn't had to be seen for this issue since. She denies urinary freq, dysuria, hematuria, vaginal discharge, bloody stool, fever, chills. Pt does admit to eating a lot of spicy food - states she puts jalapenos or hot sauce on everything. She drinks 1-2 sodas a day. Eats a lot of citrusy fruit. She does not smoke or drink alcohol. Does not take nsaids regularly.  She had bilateral salpingo-oohporectomy d/t ovarian cysts. Still has uterus. No new sexual partners.  Review of Systems:  Review of Systems See HPI  Patient Active Problem List   Diagnosis Date Noted  . Multiple pulmonary nodules  08/24/2014  . Pulmonary sarcoidosis ([place]) 08/24/2014  . Sinus tachycardia ([place]) 10/23/2013  . T wave over sensing 10/23/2013  . 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 Thrombocytosis ([place]) 03/06/2012  . Obesity 03/06/2012  . Pre-diabetes 03/06/2012  . 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 Keloid--ICD incision 04/25/2011  . 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 DEPRESSION 02/15/2011  . Unspecified vitamin D deficiency 01/20/2011  . VENTRICULAR TACHYCARDIA 01/20/2011  . Sarcoidosis ([place]) 05/12/2009  . 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 Authorizing Provider  acetaminophen (TYLENOL) 500 MG tablet Take 1,000 mg by mouth 3 (three) times daily as needed (pain).   Yes Historical Provider, MD  acetaZOLAMIDE (DIAMOX) 250 MG tablet Take 2 tablets (500 mg total) by mouth 2 (two) times daily. 09/24/13  Yes Roselee Culver, MD  beclomethasone (QVAR) 80 MCG/ACT inhaler Inhale 2 puffs into the lungs 2 (two) times daily. 02/11/15  Yes Brand Males, MD  methotrexate (RHEUMATREX) 2.5 MG tablet Take 2 tablets (5 mg total) by mouth once a week. Caution:Chemotherapy. Protect from light. 11/15/15  Yes Brand Males, MD  methylPREDNISolone (MEDROL) 4 MG tablet Take 1 tablet (4 mg total) by mouth daily. Continuous course until stopped by MD 01/22/15  Yes Brand Males, MD  metoprolol succinate (TOPROL-XL) 25 MG 24 hr tablet Take 1 tablet (25 mg total) by mouth daily. 04/01/15  Yes Deboraha Sprang, MD  sulfamethoxazole-trimethoprim (  BACTRIM DS,SEPTRA DS) 800-160 MG tablet Take 1 tablet by mouth 3 (three) times a week. 11/15/15  Yes Brand Males, MD  topiramate (TOPAMAX) 25 MG tablet Take 25 mg by mouth at bedtime.   Yes Historical Provider, MD    No Known Allergies  Past Surgical History  Procedure Laterality Date  . 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 Cardiac defibrillator  placement      Medtronic    Social History  Substance Use Topics  . Smoking status: Former Smoker -- 1.00 packs/day for 15 years    Types: Cigarettes    Quit date: 03/03/2007  . Smokeless tobacco: None  . Alcohol Use: No    Family History  Problem Relation Age of Onset  . Allergies Mother   . Diabetes Mother   . Hypertension Mother   . Hyperlipidemia Mother   . Hyperlipidemia Father   . Hypertension Father   . Hyperlipidemia Sister   . COPD Maternal Grandfather   . Leukemia Paternal Grandmother   . Heart attack Paternal Grandfather     Medication list has been reviewed and updated.  Physical Examination:  Physical Exam  Constitutional: She is oriented to person, place, and time. She appears well-developed and well-nourished. No distress.  HENT:  Head: Normocephalic and atraumatic.  Right Ear: Hearing normal.  Left Ear: Hearing normal.  Nose: Nose normal.  Mouth/Throat: Uvula is midline, oropharynx is clear and moist and mucous membranes are normal.  Eyes: Conjunctivae and lids are normal. Right eye exhibits no discharge. Left eye exhibits no discharge. No scleral icterus.  Cardiovascular: Normal rate, regular rhythm, normal heart sounds and normal pulses.   No murmur heard. Pulmonary/Chest: Effort normal and breath sounds normal. No respiratory distress. She has no wheezes. She has no rhonchi. She has no rales.  Abdominal: Soft. Normal appearance. There is tenderness in the right upper quadrant and epigastric area. There is CVA tenderness (mild right sided). There is no tenderness at McBurney's point and negative Murphy's sign.  Musculoskeletal: Normal range of motion.  Lymphadenopathy:       Head (right side): No submental, no submandibular and no tonsillar adenopathy present.       Head (left side): No submental, no submandibular and no tonsillar adenopathy present.    She has no cervical adenopathy.  Neurological: She is alert and oriented to person, place, and  time.  Skin: Skin is warm, dry and intact. No lesion and no rash noted.  Psychiatric: She has a normal mood and affect. Her speech is normal and behavior is normal. Thought content normal.   BP 128/84 mmHg  Pulse 99  Temp(Src) 98.2 F (36.8 C)  Resp 16  Ht 5\' 5"  (1.651 m)  Wt 226 lb (102.513 kg)  BMI 37.61 kg/m2  SpO2 96%  Results for orders placed or performed in visit on 01/10/16  POCT CBC  Result Value Ref Range   WBC 10.0 4.6 - 10.2 K/uL   Lymph, poc 2.2 0.6 - 3.4   POC LYMPH PERCENT 21.8 10 - 50 %L   MID (cbc) 0.6 0 - 0.9   POC MID % 6.3 0 - 12 %M   POC Granulocyte 7.2 (A) 2 - 6.9   Granulocyte percent 71.9 37 - 80 %G   RBC 4.82 4.04 - 5.48 M/uL   Hemoglobin 13.4 12.2 - 16.2 g/dL   HCT, POC 39.6 37.7 - 47.9 %   MCV 82.2 80 - 97 fL   MCH,  POC 27.7 27 - 31.2 pg   MCHC 33.7 31.8 - 35.4 g/dL   RDW, POC 14.9 %   Platelet Count, POC 472 (A) 142 - 424 K/uL   MPV 7.3 0 - 99.8 fL  POCT Microscopic Urinalysis ([facility])  Result Value Ref Range   WBC,UR,HPF,POC Moderate (A) None WBC/hpf   RBC,UR,HPF,POC Few (A) None RBC/hpf   Bacteria Few (A) None, Too numerous to count   Mucus Absent Absent   Epithelial Cells, UR Per Microscopy Moderate (A) None, Too numerous to count cells/hpf  POCT urinalysis dipstick  Result Value Ref Range   Color, UA yellow yellow   Clarity, UA clear clear   Glucose, UA negative negative   Bilirubin, UA negative negative   Ketones, POC UA negative negative   Spec Grav, UA 1.025    Blood, UA trace-lysed (A) negative   pH, UA 5.5    Protein Ur, POC negative negative   Urobilinogen, UA 0.2    Nitrite, UA Negative Negative   Leukocytes, UA Negative Negative    Assessment and Plan:  1. RUQ abdominal pain 2. Abnormal urinalysis RUQ pain x 4 days. Has had 4 episodes over the past 1.5 years that are self-limiting. With first episode had an extensive workup with negative labs, negative abdominal CT and negative RUQ u/s. She was referred to GI at that  time but never went since sx resolved. Here, CBC normal. CMP and lipase pending. Urine micro with trace blood and mod leuks - urine culture pending. I do not think this is representative of infection and not enough blood to support kidney stone. Will treat like gastritis. Take omeprazole daily and carafate QID. Discussed diet modifications. Referred back to GI. Return in the meantime if anything changes or no improvement in 1-2 weeks. - POCT CBC - POCT Microscopic Urinalysis ([facility]) - POCT urinalysis dipstick - Comprehensive metabolic panel - Lipase - omeprazole (PRILOSEC) 20 MG capsule; Take 1 capsule (20 mg total) by mouth daily.  Dispense: 30 capsule; Refill: 3 - Ambulatory referral to Gastroenterology - sucralfate (CARAFATE) 1 GM/10ML suspension; Take 10 mLs (1 g total) by mouth 4 (four) times daily -  with meals and at bedtime.  Dispense: 420 mL; Refill: 0 - Urine culture   Benjaman Pott. Drenda Freeze, MHS [facility]  01/10/2016    addnd J Copland 1/17- discussed with Ms. Tawni Pummel.  Pt has 3-5 rbc and is a former smoker over 40 yo.  Unless her urine culture is positive she will need urology eval for microhematuria.  Will send her a reminder about this on EMR

## 2016-01-10 NOTE — Patient Instructions (Addendum)
Start taking omeprazole daily. Take carafate 4 times a day to help with pain and stomach irritation. Limit spicy food and citrusy fruits/drinks and eliminate sodas. Use miralax for constipation. Take 1 capful twice a day until stools are loose and then daily for 1 week thereafter. Drink plenty of water, at least 64 oz water a day. I will call you with lab results. You will get a phone call to make appointment with GI.

## 2016-01-11 ENCOUNTER — Encounter: Payer: Self-pay | Admitting: Gastroenterology

## 2016-01-11 LAB — URINE CULTURE
Colony Count: NO GROWTH
ORGANISM ID, BACTERIA: NO GROWTH

## 2016-01-17 ENCOUNTER — Other Ambulatory Visit: Payer: Self-pay | Admitting: Physician Assistant

## 2016-01-17 ENCOUNTER — Telehealth: Payer: Self-pay

## 2016-01-17 ENCOUNTER — Encounter: Payer: Self-pay | Admitting: Physician Assistant

## 2016-01-17 DIAGNOSIS — R319 Hematuria, unspecified: Secondary | ICD-10-CM

## 2016-01-17 NOTE — Telephone Encounter (Signed)
Pt calling about labs °

## 2016-01-17 NOTE — Telephone Encounter (Signed)
Lmom. I let her know I would release labs to mychart with my explanation. She can call back if she has questions. I am referring to urology due to blood in urine and urine culture was negative.

## 2016-02-02 ENCOUNTER — Ambulatory Visit (INDEPENDENT_AMBULATORY_CARE_PROVIDER_SITE_OTHER): Payer: BC Managed Care – PPO | Admitting: Family Medicine

## 2016-02-02 VITALS — BP 128/80 | HR 99 | Temp 98.6°F | Resp 16 | Ht 65.0 in | Wt 227.0 lb

## 2016-02-02 DIAGNOSIS — M25511 Pain in right shoulder: Secondary | ICD-10-CM | POA: Diagnosis not present

## 2016-02-02 DIAGNOSIS — R253 Fasciculation: Secondary | ICD-10-CM | POA: Diagnosis not present

## 2016-02-02 MED ORDER — METAXALONE 800 MG PO TABS: 800.0000 mg | ORAL_TABLET | Freq: Every day | ORAL | Status: DC

## 2016-02-02 NOTE — Progress Notes (Signed)
By signing my name below, I, Moises Blood, attest that this documentation has been prepared under the direction and in the presence of Robyn Haber, MD. Electronically Signed: Moises Blood, [place]. 02/02/2016 , 5:24 PM .  Patient was seen in room 10 .   Patient ID: Alexandra Hood MRN: [id number], DOB: 10/23/1971, 45 y.o. Date of Encounter: 02/02/2016  Primary Physician: [name], MD  Chief Complaint:  Chief Complaint  Patient presents with  . Shoulder Pain    right, x last night  . Arm Pain    right upper    HPI:  Alexandra Hood is a 45 y.o. female who presents to [facility] complaining of right shoulder pain radiating down to her upper right arm that started last night. She denies any neck pain or stiffness.   She has pmhx of pseudotumor cerebri and is on diamox.   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   Past Medical History  Diagnosis Date  . Pertussis   . Pseudotumor cerebri     .Marland KitchenMarland KitchenDr Sabra Heck and Dr. Katy Fitch - dxed Jan 2009, on diamox  - LP 01/24/2008 at [facility] - Normal ( pressure 14cm, gluc 68, Protein 23, Clear, colorless, RBC 3, WBC - too few to count)  - CT head non contrast done at [facility] 11/25/2007: "normal". Ddoes not report MRI, MRA, MRV being done - 2nd opinon by Dr. Leonie Man - reportdly had MRI/MRV both normal; followup pending  . 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 Pulmonary sarcoidosis ([place])     - Arlyce Harman 07/25/2010 on pred 10/mtx 10 -> FEv1 1.84L/775%, FVC 2.25L - SPiro 09/06/2010 -> Fev1 2.14L/81%. Cut pred to 5mg  per day. Cont mtx - SPiro 01/20/2011 - Fev1 1.6L/61%, Ratio 74%. CXR Worse -> increased pred to 10mg  perday. Cont Mtx.    . Vitamin D deficiency     - Aug 2011: 22 and low -> commence Rx. - Jan 2012: 11  . Ventricular tachycardia ([place])      Home Meds: Prior to Admission medications   Medication  Sig Start Date End Date Taking? Authorizing Provider  acetaminophen (TYLENOL) 500 MG tablet Take 1,000 mg by mouth 3 (three) times daily as needed (pain).   Yes Historical Provider, MD  acetaZOLAMIDE (DIAMOX) 250 MG tablet Take 2 tablets (500 mg total) by mouth 2 (two) times daily. 09/24/13  Yes Roselee Culver, MD  beclomethasone (QVAR) 80 MCG/ACT inhaler Inhale 2 puffs into the lungs 2 (two) times daily. 02/11/15  Yes Brand Males, MD  methotrexate (RHEUMATREX) 2.5 MG tablet Take 2 tablets (5 mg total) by mouth once a week. Caution:Chemotherapy. Protect from light. 11/15/15  Yes Brand Males, MD  methylPREDNISolone (MEDROL) 4 MG tablet Take 1 tablet (4 mg total) by mouth daily. Continuous course until stopped by MD 01/22/15  Yes Brand Males, MD  metoprolol succinate (TOPROL-XL) 25 MG 24 hr tablet Take 1 tablet (25 mg total) by mouth daily. 04/01/15  Yes Deboraha Sprang, MD  sucralfate (CARAFATE) 1 GM/10ML suspension Take 10 mLs (1 g total) by mouth 4 (four) times daily -  with meals and at bedtime. 01/10/16  Yes Bennett Scrape V, PA-C  sulfamethoxazole-trimethoprim (BACTRIM DS,SEPTRA DS) 800-160 MG tablet Take 1 tablet by mouth 3 (three) times a week. 11/15/15  Yes Brand Males, MD  topiramate (TOPAMAX) 25 MG tablet Take 25 mg by mouth at  bedtime.   Yes Historical Provider, MD  omeprazole (PRILOSEC) 20 MG capsule Take 1 capsule (20 mg total) by mouth daily. Patient not taking: Reported on 02/02/2016 01/10/16   Ezekiel Slocumb, PA-C    Allergies: No Known Allergies  Social History   Social History  . Marital Status: Legally Separated    Spouse Name: N/A  . Number of Children: 1  . Years of Education: N/A   Occupational History  . school counsler Restaurant manager, fast food)    Social History Main Topics  . Smoking status: Former Smoker -- 1.00 packs/day for 15 years    Types: Cigarettes    Quit date: 03/03/2007  . Smokeless tobacco: Never Used  . Alcohol Use: No  . Drug Use: No  . Sexual  Activity: Not Currently     Comment: bilateral oophorectomy   Other Topics Concern  . 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 Wants letter      Review of Systems: Constitutional: negative for fever, chills, night sweats, weight changes, or fatigue  HEENT: negative for vision changes, hearing loss, congestion, rhinorrhea, ST, epistaxis, or sinus pressure Cardiovascular: negative for chest pain or palpitations Respiratory: negative for hemoptysis, wheezing, shortness of breath, or cough Abdominal: negative for abdominal pain, nausea, vomiting, diarrhea, or constipation Dermatological: negative for rash Musc: positive for arthralgia (right shoulder), myalgia (right arm); negative for neck pain, neck stiffness Neurologic: negative for headache, dizziness, or syncope All other systems reviewed and are otherwise negative with the exception to those above and in the HPI.  Physical Exam: Blood pressure 128/80, pulse 99, temperature 98.6 F (37 C), temperature source Oral, resp. rate 16, height 5\' 5"  (1.651 m), weight 227 lb (102.967 kg), SpO2 95 %., Body mass index is 37.77 kg/(m^2). General: Well developed, well nourished, in no acute distress. Head: Normocephalic, atraumatic, eyes without discharge, sclera non-icteric, nares are without discharge. Bilateral auditory canals clear, TM's are without perforation, pearly grey and translucent with reflective cone of light bilaterally. Oral cavity moist, posterior pharynx without exudate, erythema, peritonsillar abscess, or post nasal drip.  Neck: Supple. No thyromegaly. Full ROM. No lymphadenopathy. Lungs: Clear bilaterally to auscultation without wheezes, rales, or rhonchi. Breathing is unlabored. Heart: RRR with S1 S2. No murmurs, rubs, or gallops appreciated. Msk:  Strength and tone normal for age. Tender in right supraclavicular area and suprascapular  area Extremities/Skin: Warm and dry. No clubbing or cyanosis. No edema. No rashes or suspicious lesions. Neuro: Alert and oriented X 3. Moves all extremities spontaneously. Gait is normal. CNII-XII grossly in tact. Psych:  Responds to questions appropriately with a normal affect.     ASSESSMENT AND PLAN:  45 y.o. year old female with  This chart was scribed in my presence and reviewed by me personally.    ICD-9-CM ICD-10-CM   1. Pain in joint of right shoulder 719.41 M25.511 metaxalone (SKELAXIN) 800 MG tablet  2. Frequent fasciculation of limb muscle 781.0 R25.3 metaxalone (SKELAXIN) 800 MG tablet       Signed, Robyn Haber, MD 02/02/2016 5:28 PM

## 2016-02-02 NOTE — Patient Instructions (Signed)
Please last note this problem is worsening or if it has not significantly resolved by Friday morning

## 2016-02-04 ENCOUNTER — Ambulatory Visit (INDEPENDENT_AMBULATORY_CARE_PROVIDER_SITE_OTHER): Payer: BC Managed Care – PPO | Admitting: Physician Assistant

## 2016-02-04 ENCOUNTER — Ambulatory Visit (INDEPENDENT_AMBULATORY_CARE_PROVIDER_SITE_OTHER): Payer: BC Managed Care – PPO

## 2016-02-04 VITALS — BP 120/80 | HR 98 | Temp 98.8°F | Resp 17 | Ht 65.0 in | Wt 227.0 lb

## 2016-02-04 DIAGNOSIS — M792 Neuralgia and neuritis, unspecified: Secondary | ICD-10-CM

## 2016-02-04 DIAGNOSIS — G932 Benign intracranial hypertension: Secondary | ICD-10-CM

## 2016-02-04 MED ORDER — PREDNISONE 20 MG PO TABS: ORAL_TABLET | ORAL | Status: AC

## 2016-02-04 NOTE — Progress Notes (Signed)
02/04/2016 6:19 PM   DOB: 07/19/1971 / MRN: [id number]  SUBJECTIVE:  Alexandra Hood is a 45 y.o. female presenting for a recheck of right arm pain along with some numbness running down to her hand.  Associates poor grip strength of her right hand. Has a history of sarcoid and pseudotumor cerebri and take methylprednisolone 4 mg daily for control of sarcoid. Was seen by Dr. Carlean Jews two days ago and placed on flexeril.    She has No Known Allergies.   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    She  reports that she quit smoking about 8 years ago. Her smoking use included Cigarettes. She has a 15 pack-year smoking history. She has never used smokeless tobacco. She reports that she does not drink alcohol or use illicit drugs. She  reports that she does not currently engage in sexual activity. The patient  has past surgical history that includes Oophorectomy (05 & 08) and Cardiac defibrillator placement.  Her family history includes Allergies in her mother; COPD in her maternal grandfather; Diabetes in her mother; Heart attack in her paternal grandfather; Hyperlipidemia in her father, mother, and sister; Hypertension in her father and mother; Leukemia in her paternal grandmother.  Review of Systems  Constitutional: Negative for fever and chills.  Eyes: Negative for blurred vision.  Respiratory: Negative for cough and shortness of breath.   Cardiovascular: Negative for chest pain.  Gastrointestinal: Negative for nausea and abdominal pain.  Genitourinary: Negative for dysuria, urgency and frequency.  Musculoskeletal: Negative for myalgias.  Skin: Negative for rash.  Neurological: Positive for tingling. Negative for dizziness, tremors, sensory change, speech change, focal weakness and headaches.  Psychiatric/Behavioral: Negative for depression. The patient is not  nervous/anxious.     Problem list and medications reviewed and updated by myself where necessary, and exist elsewhere in the encounter.   OBJECTIVE:  BP 120/80 mmHg  Pulse 98  Temp(Src) 98.8 F (37.1 C) (Oral)  Resp 17  Ht 5\' 5"  (1.651 m)  Wt 227 lb (102.967 kg)  BMI 37.77 kg/m2  SpO2 96%  Physical Exam  Constitutional: She is oriented to person, place, and time. She appears well-nourished. No distress.  Eyes: EOM are normal. Pupils are equal, round, and reactive to light.  Cardiovascular: Normal rate.   Pulmonary/Chest: Effort normal.  Abdominal: She exhibits no distension.  Neurological: She is alert and oriented to person, place, and time. She has normal strength. She displays no tremor. No cranial nerve deficit or sensory deficit. She exhibits normal muscle tone. She displays a negative Romberg sign. Coordination and gait normal. GCS eye subscore is 4. GCS verbal subscore is 5. GCS motor subscore is 6.  Reflex Scores:      Tricep reflexes are 2+ on the right side and 2+ on the left side.      Bicep reflexes are 2+ on the right side and 2+ on the left side.      Brachioradialis reflexes are 2+ on the right side and 2+ on the left side. Skin: Skin is dry. She is not diaphoretic.  Psychiatric: She has a normal mood and affect.  Vitals reviewed.   No results found for this or any previous visit (from the past 72 hour(s)).  Dg Cervical Spine 2 Or 3 Views  02/04/2016  CLINICAL DATA:  Neck pain for 4 days.  No known injury. EXAM: CERVICAL SPINE - 2-3  VIEW COMPARISON:  None. FINDINGS: There is no evidence of cervical spine fracture or prevertebral soft tissue swelling. Alignment is normal. No other significant bone abnormalities are identified. IMPRESSION: Negative cervical spine radiographs. Electronically Signed   By: Staci Righter M.D.   On: 02/04/2016 17:51   Lab Results  Component Value Date   HGBA1C 5.8 04/25/2013     ASSESSMENT AND PLAN  Alexandra Hood was seen today for  shoulder pain, neck pain and arm pain.  Diagnoses and all orders for this visit:  Radicular pain in right arm -     DG Cervical Spine 2 or 3 views; Future  Pseudotumor cerebri -     Ambulatory referral to Neurology  Other orders -     predniSONE (DELTASONE) 20 MG tablet; Take 3 in the morning for 3 days, then 2 in the morning for 3 days, and then 1 in the morning for 3 days.    The patient was advised to call or return to clinic if she does not see an improvement in symptoms or to seek the care of the closest emergency department if she worsens with the above plan.   Philis Fendt, MHS, PA-C [facility] 02/04/2016 6:19 PM

## 2016-02-15 ENCOUNTER — Ambulatory Visit (INDEPENDENT_AMBULATORY_CARE_PROVIDER_SITE_OTHER): Payer: BC Managed Care – PPO | Admitting: *Deleted

## 2016-02-15 DIAGNOSIS — I429 Cardiomyopathy, unspecified: Secondary | ICD-10-CM

## 2016-02-15 DIAGNOSIS — Z95810 Presence of automatic (implantable) cardiac defibrillator: Secondary | ICD-10-CM | POA: Diagnosis not present

## 2016-02-15 DIAGNOSIS — I472 Ventricular tachycardia: Secondary | ICD-10-CM

## 2016-02-15 DIAGNOSIS — I4729 Other ventricular tachycardia: Secondary | ICD-10-CM

## 2016-02-16 ENCOUNTER — Encounter (HOSPITAL_COMMUNITY): Payer: Self-pay | Admitting: *Deleted

## 2016-02-16 ENCOUNTER — Other Ambulatory Visit: Payer: Self-pay | Admitting: Gastroenterology

## 2016-02-16 NOTE — Progress Notes (Signed)
Remote ICD transmission.   

## 2016-02-25 ENCOUNTER — Ambulatory Visit (HOSPITAL_COMMUNITY): Payer: BC Managed Care – PPO | Admitting: Anesthesiology

## 2016-02-25 ENCOUNTER — Encounter (HOSPITAL_COMMUNITY)
Admission: RE | Disposition: A | Discharge status: Home / Self Care | Payer: Self-pay | Source: Ambulatory Visit | Attending: Gastroenterology

## 2016-02-25 ENCOUNTER — Ambulatory Visit (HOSPITAL_COMMUNITY)
Admission: RE | Admit: 2016-02-25 | Discharge: 2016-02-25 | Disposition: A | Discharge status: Home / Self Care | Payer: BC Managed Care – PPO | Source: Ambulatory Visit | Attending: Gastroenterology | Admitting: Gastroenterology

## 2016-02-25 ENCOUNTER — Encounter (HOSPITAL_COMMUNITY): Payer: Self-pay

## 2016-02-25 DIAGNOSIS — Z95810 Presence of automatic (implantable) cardiac defibrillator: Secondary | ICD-10-CM | POA: Insufficient documentation

## 2016-02-25 DIAGNOSIS — Z7952 Long term (current) use of systemic steroids: Secondary | ICD-10-CM | POA: Insufficient documentation

## 2016-02-25 DIAGNOSIS — Z6837 Body mass index (BMI) 37.0-37.9, adult: Secondary | ICD-10-CM | POA: Insufficient documentation

## 2016-02-25 DIAGNOSIS — I10 Essential (primary) hypertension: Secondary | ICD-10-CM | POA: Diagnosis not present

## 2016-02-25 DIAGNOSIS — Z79899 Other long term (current) drug therapy: Secondary | ICD-10-CM | POA: Diagnosis not present

## 2016-02-25 DIAGNOSIS — Z87891 Personal history of nicotine dependence: Secondary | ICD-10-CM | POA: Diagnosis not present

## 2016-02-25 DIAGNOSIS — R1011 Right upper quadrant pain: Secondary | ICD-10-CM | POA: Insufficient documentation

## 2016-02-25 DIAGNOSIS — D8685 Sarcoid myocarditis: Secondary | ICD-10-CM | POA: Diagnosis not present

## 2016-02-25 DIAGNOSIS — E669 Obesity, unspecified: Secondary | ICD-10-CM | POA: Insufficient documentation

## 2016-02-25 HISTORY — PX: ESOPHAGOGASTRODUODENOSCOPY (EGD) WITH PROPOFOL: SHX5813

## 2016-02-25 SURGERY — ESOPHAGOGASTRODUODENOSCOPY (EGD) WITH PROPOFOL
Anesthesia: Monitor Anesthesia Care

## 2016-02-25 MED ORDER — LACTATED RINGERS IV SOLN
INTRAVENOUS | Status: DC
Start: 2016-02-25 — End: 2016-02-25
  Administered 2016-02-25: 1000 mL via INTRAVENOUS

## 2016-02-25 MED ORDER — PROPOFOL 10 MG/ML IV BOLUS
INTRAVENOUS | Status: AC
  Filled 2016-02-25: qty 40

## 2016-02-25 MED ORDER — SODIUM CHLORIDE 0.9 % IV SOLN: INTRAVENOUS | Status: DC

## 2016-02-25 SURGICAL SUPPLY — 15 items

## 2016-02-25 NOTE — Transfer of Care (Signed)
Immediate Anesthesia Transfer of Care Note  Patient: Alexandra Hood  Procedure(s) Performed: Procedure(s): ESOPHAGOGASTRODUODENOSCOPY (EGD) WITH PROPOFOL (N/A)  Patient Location: PACU  Anesthesia Type:MAC  Level of Consciousness: awake, alert  and oriented  Airway & Oxygen Therapy: Patient Spontanous Breathing and Patient connected to nasal cannula oxygen  Post-op Assessment: Report given to RN and Post -op Vital signs reviewed and stable  Post vital signs: Reviewed and stable  Last Vitals:  Filed Vitals:   02/25/16 0718  BP: 135/88  Pulse: 93  Temp: 36.7 C  Resp: 15    Complications: No apparent anesthesia complications

## 2016-02-25 NOTE — Anesthesia Postprocedure Evaluation (Signed)
Anesthesia Post Note  Patient: Alexandra Hood  Procedure(s) Performed: Procedure(s) (LRB): ESOPHAGOGASTRODUODENOSCOPY (EGD) WITH PROPOFOL (N/A)  Patient location during evaluation: Endoscopy Anesthesia Type: MAC Level of consciousness: awake and alert Pain management: pain level controlled Vital Signs Assessment: post-procedure vital signs reviewed and stable Respiratory status: spontaneous breathing, nonlabored ventilation, respiratory function stable and patient connected to nasal cannula oxygen Cardiovascular status: stable and blood pressure returned to baseline Anesthetic complications: no Comments: No magnet used during case.    Last Vitals:  Filed Vitals:   02/25/16 0718 02/25/16 0832  BP: 135/88 118/78  Pulse: 93   Temp: 36.7 C   Resp: 15 15    Last Pain:  Filed Vitals:   02/25/16 0832  PainSc: 3                  Catalina Gravel

## 2016-02-25 NOTE — Anesthesia Preprocedure Evaluation (Addendum)
Anesthesia Evaluation  Patient identified by MRN, date of birth, ID band Patient awake    Reviewed: Allergy & Precautions, NPO status , Patient's Chart, lab work & pertinent test results, reviewed documented beta blocker date and time   Airway Mallampati: II  TM Distance: >3 FB Neck ROM: Full    Dental  (+) Teeth Intact, Dental Advisory Given   Pulmonary former smoker,  Pulmonary sarcoidosis--chronic steroids   Pulmonary exam normal breath sounds clear to auscultation       Cardiovascular hypertension, Pt. on home beta blockers and Pt. on medications Normal cardiovascular exam+ Cardiac Defibrillator  Rhythm:Regular Rate:Normal  Medtronic ICD implanted for ventricular tachycardia occurring in the setting of known pulmonary sarcoid and MRI confirmed cardiac sarcoid.    Neuro/Psych PSYCHIATRIC DISORDERS Depression Pseudotumor cerebri negative neurological ROS     GI/Hepatic negative GI ROS, Neg liver ROS, RUQ pain   Endo/Other  Obesity   Renal/GU negative Renal ROS     Musculoskeletal negative musculoskeletal ROS (+)   Abdominal   Peds  Hematology negative hematology ROS (+)   Anesthesia Other Findings Day of surgery medications reviewed with the patient.  Reproductive/Obstetrics                          Anesthesia Physical Anesthesia Plan  ASA: III  Anesthesia Plan: MAC   Post-op Pain Management:    Induction: Intravenous  Airway Management Planned: Nasal Cannula  Additional Equipment:   Intra-op Plan:   Post-operative Plan:   Informed Consent: I have reviewed the patients History and Physical, chart, labs and discussed the procedure including the risks, benefits and alternatives for the proposed anesthesia with the patient or authorized representative who has indicated his/her understanding and acceptance.   Dental advisory given  Plan Discussed with: CRNA and  Anesthesiologist  Anesthesia Plan Comments: (Discussed risks/benefits/alternatives to MAC sedation including need for ventilatory support, hypotension, need for conversion to general anesthesia.  All patient questions answered.  Patient wished to proceed.  Will have magnet available if needed.)       Anesthesia Quick Evaluation

## 2016-02-25 NOTE — Discharge Instructions (Addendum)
Esophagogastroduodenoscopy, Care After Refer to this sheet in the next few weeks. These instructions provide you with information about caring for yourself after your procedure. Your health care provider may also give you more specific instructions. Your treatment has been planned according to current medical practices, but problems sometimes occur. Call your health care provider if you have any problems or questions after your procedure. WHAT TO EXPECT AFTER THE PROCEDURE After your procedure, it is typical to feel:  Soreness in your throat.  Pain with swallowing.  Sick to your stomach (nauseous).  Bloated.  Dizzy.  Fatigued. HOME CARE INSTRUCTIONS  Do not eat or drink anything until the numbing medicine (local anesthetic) has worn off and your gag reflex has returned. You will know that the local anesthetic has worn off when you can swallow comfortably.  Do not drive or operate machinery until directed by your health care provider.  Take medicines only as directed by your health care provider. SEEK MEDICAL CARE IF:   You cannot stop coughing.  You are not urinating at all or less than usual. SEEK IMMEDIATE MEDICAL CARE IF:  You have difficulty swallowing.  You cannot eat or drink.  You have worsening throat or chest pain.  You have dizziness or lightheadedness or you faint.  You have nausea or vomiting.  You have chills.  You have a fever.  You have severe abdominal pain.  You have black, tarry, or bloody stools.   This information is not intended to replace advice given to you by your health care provider. Make sure you discuss any questions you have with your health care provider.   Document Released: 11/27/2012 Document Revised: 01/01/2015 Document Reviewed: 11/27/2012 Elsevier Interactive Patient Education 2016 [place] office will call this afternoon and schedule a test on the gallbladder and arrange follow-up office visit in several weeks.

## 2016-02-25 NOTE — H&P (Signed)
Subjective:   Patient is a 45 y.o. female presents with Chronic right upper quadrant abdominal pain. She's never had EGD so this is done to evaluate for some cause of her right upper quadrant pain.. Procedure including risks and benefits discussed in office.  Patient Active Problem List   Diagnosis Date Noted  . Multiple pulmonary nodules 08/24/2014  . Pulmonary sarcoidosis ([place]) 08/24/2014  . Sinus tachycardia ([place]) 10/23/2013  . T wave over sensing 10/23/2013  . 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 Thrombocytosis ([place]) 03/06/2012  . Obesity 03/06/2012  . Pre-diabetes 03/06/2012  . 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 Keloid--ICD incision 04/25/2011  . 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 DEPRESSION 02/15/2011  . Unspecified vitamin D deficiency 01/20/2011  . VENTRICULAR TACHYCARDIA 01/20/2011  . Sarcoidosis ([place]) 05/12/2009  . PSEUDOTUMOR CEREBRI 05/12/2009   Past Medical History  Diagnosis Date  . Pertussis   . Pseudotumor cerebri     .Marland KitchenMarland KitchenDr Sabra Heck and Dr. Katy Fitch - dxed Jan 2009, on diamox  - LP 01/24/2008 at [facility] - Normal ( pressure 14cm, gluc 68, Protein 23, Clear, colorless, RBC 3, WBC - too few to count)  - CT head non contrast done at [facility] 11/25/2007: "normal". Ddoes not report MRI, MRA, MRV being done - 2nd opinon by Dr. Leonie Man - reportdly had MRI/MRV both normal; followup pending  . 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 Pulmonary sarcoidosis ([place])     - Arlyce Harman 07/25/2010 on pred 10/mtx 10 -> FEv1 1.84L/775%, FVC 2.25L - SPiro 09/06/2010 -> Fev1 2.14L/81%. Cut pred to 5mg  per day. Cont mtx - SPiro 01/20/2011 - Fev1 1.6L/61%, Ratio 74%. CXR Worse -> increased pred to 10mg  perday. Cont Mtx.    . Vitamin D deficiency     - Aug 2011: 22 and low -> commence Rx. - Jan 2012: 11  . Ventricular tachycardia [facility])     Past Surgical History   Procedure Laterality Date  . 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 Cardiac defibrillator placement  march 2012    Medtronic    Prescriptions prior to admission  Medication Sig Dispense Refill Last Dose  . acetaminophen (TYLENOL) 500 MG tablet Take 1,000 mg by mouth 3 (three) times daily as needed (pain).   Past Month at Unknown time  . acetaZOLAMIDE (DIAMOX) 250 MG tablet Take 2 tablets (500 mg total) by mouth 2 (two) times daily. 120 tablet 1 Past Month at Unknown time  . beclomethasone (QVAR) 80 MCG/ACT inhaler Inhale 2 puffs into the lungs 2 (two) times daily. 1 Inhaler 6 02/25/2016 at 0630  . metaxalone (SKELAXIN) 800 MG tablet Take 1 tablet (800 mg total) by mouth at bedtime. 7 tablet 1 Past Week at Unknown time  . methotrexate (RHEUMATREX) 2.5 MG tablet Take 2 tablets (5 mg total) by mouth once a week. Caution:Chemotherapy. Protect from light. 8 tablet 5 02/24/2016 at 2000  . methylPREDNISolone (MEDROL) 4 MG tablet Take 1 tablet (4 mg total) by mouth daily. Continuous course until stopped by MD 30 tablet 5 02/25/2016 at 0630  . metoprolol succinate (TOPROL-XL) 25 MG 24 hr tablet Take 1 tablet (25 mg total) by mouth daily. 30 tablet 6 02/25/2016 at 0630  . omeprazole (PRILOSEC) 20 MG capsule Take 1 capsule (20 mg total) by mouth daily. 30 capsule 3 02/24/2016 at 2000  . sucralfate (CARAFATE) 1 GM/10ML suspension Take 10  mLs (1 g total) by mouth 4 (four) times daily -  with meals and at bedtime. 420 mL 0 02/24/2016 at 2000  . sulfamethoxazole-trimethoprim (BACTRIM DS,SEPTRA DS) 800-160 MG tablet Take 1 tablet by mouth 3 (three) times a week. 12 tablet 5 02/24/2016 at 2000   No Known Allergies  Social History  Substance Use Topics  . Smoking status: Former Smoker -- 1.00 packs/day for 15 years    Types: Cigarettes    Quit date: 03/03/2007  . Smokeless tobacco: Never Used  . Alcohol Use: No    Family History  Problem Relation Age of Onset  . Allergies Mother   . Diabetes Mother   .  Hypertension Mother   . Hyperlipidemia Mother   . Hyperlipidemia Father   . Hypertension Father   . Hyperlipidemia Sister   . COPD Maternal Grandfather   . Leukemia Paternal Grandmother   . Heart attack Paternal Grandfather      Objective:   Patient Vitals for the past 8 hrs:  BP Temp Temp src Pulse Resp SpO2 Height Weight  02/25/16 0718 135/88 mmHg 98.1 F (36.7 C) Oral 93 15 96 % 5\' 5"  (1.651 m) 102.967 kg (227 lb)         See MD Preop evaluation      Assessment:   1. Right upper quadrant pain. Etiology unclear despite extensive workup. 2. Implantable defibrillator due to history of ventricular tachycardia  Plan:   We will proceed with EGD. Has been discussed extensively in the office.

## 2016-02-25 NOTE — Op Note (Signed)
[place], [address]   ENDOSCOPY PROCEDURE REPORT  PATIENT: Alexandra Hood, Alexandra Hood  MR#: [id number] BIRTHDATE: [birth year] , 44  yrs. old GENDER: female ENDOSCOPIST:Elda Dunkerson, MD [facility] PROCEDURE DATE:  02/25/2016 PROCEDURE:   EGD ASA CLASS:    class III INDICATIONS: patients with persistent pain in the right upper quadrant with ultrasound CT done within the past couple years it has been negative. MEDICATION: propofol 150 mg IV TOPICAL ANESTHETIC:   cetacaine spray  DESCRIPTION OF PROCEDURE:   After the risks and benefits of the procedure were explained, informed consent was obtained.  The Pentax Gastroscope [id number]  endoscope was introduced through the mouth  and advanced to the second portion of the duodenum .  The instrument was slowly withdrawn as the mucosa was fully examined. Estimated blood loss is zero unless otherwise noted in this procedure report. The 2nd duodenum, duodenal bulb and pyloric channel were normal. The fundus of the stomach and cardio were normal in the retroflex view. The antrum and body were normal. There were no masses are ulcerationsor any signs of recent bleeding. The scope was withdrawn in the GE junction appear normal as did the entire esophagus.the patient tolerated the procedure well.    The scope was then withdrawn from the patient and the procedure completed.  COMPLICATIONS: There were no immediate complications.  ENDOSCOPIC IMPRESSION: 1. right upper quadrant pain. EGD is normal. RECOMMENDATIONS: we will go ahead and set up CCK stimulated hepatobiliary scan. Will follow back in the office and one month.   _______________________________ eSigned:  Laurence Spates, MD 02/25/2016 8:36 AM     cc: [facility]: ICD CODES:  The ICD and CPT codes recommended by this software are interpretations from the data that the clinical  staff has captured with the software.  The verification of the translation of this report to the ICD and CPT codes and modifiers is the sole responsibility of the health care institution and practicing physician where this report was generated.  [employer] will not be held responsible for the validity of the ICD and CPT codes included on this report.  AMA assumes no liability for data contained or not contained herein. CPT is a Designer, television/film set of the [employer].  PATIENT NAME:  Stevanna, Karren MR#: [id number]

## 2016-02-28 ENCOUNTER — Other Ambulatory Visit (HOSPITAL_COMMUNITY): Payer: Self-pay | Admitting: Gastroenterology

## 2016-02-28 ENCOUNTER — Encounter (HOSPITAL_COMMUNITY): Payer: Self-pay | Admitting: Gastroenterology

## 2016-02-28 DIAGNOSIS — R1011 Right upper quadrant pain: Secondary | ICD-10-CM

## 2016-02-29 ENCOUNTER — Ambulatory Visit: Payer: BC Managed Care – PPO | Admitting: Gastroenterology

## 2016-02-29 ENCOUNTER — Other Ambulatory Visit: Payer: Self-pay | Admitting: Internal Medicine

## 2016-03-02 ENCOUNTER — Ambulatory Visit (HOSPITAL_COMMUNITY): Payer: BC Managed Care – PPO

## 2016-03-02 ENCOUNTER — Encounter (HOSPITAL_COMMUNITY): Payer: BC Managed Care – PPO

## 2016-03-02 ENCOUNTER — Encounter (HOSPITAL_COMMUNITY)
Admission: RE | Admit: 2016-03-02 | Discharge: 2016-03-02 | Disposition: A | Discharge status: Home / Self Care | Payer: BC Managed Care – PPO | Source: Ambulatory Visit | Attending: Gastroenterology | Admitting: Gastroenterology

## 2016-03-02 DIAGNOSIS — R1011 Right upper quadrant pain: Secondary | ICD-10-CM

## 2016-03-02 MED ORDER — TECHNETIUM TC 99M MEBROFENIN IV KIT
5.0000 | PACK | Freq: Once | INTRAVENOUS | Status: AC | PRN
  Administered 2016-03-02: 5.2 via INTRAVENOUS

## 2016-03-02 MED ORDER — SINCALIDE 5 MCG IJ SOLR
2.0000 ug | Freq: Once | INTRAMUSCULAR | Status: AC
  Administered 2016-03-02: 2 ug via INTRAVENOUS

## 2016-03-06 LAB — CUP PACEART REMOTE DEVICE CHECK
Battery Voltage: 2.98 V
Brady Statistic AP VS Percent: 0.01 %
Brady Statistic AS VS Percent: 99.95 %
HIGH POWER IMPEDANCE MEASURED VALUE: 266 Ohm
HIGH POWER IMPEDANCE MEASURED VALUE: 40 Ohm
HighPow Impedance: 50 Ohm
Implantable Lead Implant Date: 20120308
Implantable Lead Location: 753860
Implantable Lead Model: 6947
Lead Channel Impedance Value: 342 Ohm
Lead Channel Pacing Threshold Amplitude: 0.375 V
Lead Channel Pacing Threshold Pulse Width: 0.4 ms
Lead Channel Pacing Threshold Pulse Width: 0.4 ms
Lead Channel Sensing Intrinsic Amplitude: 2.25 mV
Lead Channel Sensing Intrinsic Amplitude: 5 mV
Lead Channel Setting Pacing Amplitude: 2.5 V
Lead Channel Setting Sensing Sensitivity: 0.45 mV
MDC IDC LEAD IMPLANT DT: 20120308
MDC IDC LEAD LOCATION: 753859
MDC IDC MSMT LEADCHNL RA IMPEDANCE VALUE: 437 Ohm
MDC IDC MSMT LEADCHNL RA SENSING INTR AMPL: 2.25 mV
MDC IDC MSMT LEADCHNL RV PACING THRESHOLD AMPLITUDE: 0.875 V
MDC IDC MSMT LEADCHNL RV SENSING INTR AMPL: 5 mV
MDC IDC SESS DTM: 20170221083528
MDC IDC SET LEADCHNL RA PACING AMPLITUDE: 2 V
MDC IDC SET LEADCHNL RV PACING PULSEWIDTH: 0.4 ms
MDC IDC STAT BRADY AP VP PERCENT: 0 %
MDC IDC STAT BRADY AS VP PERCENT: 0.04 %
MDC IDC STAT BRADY RA PERCENT PACED: 0.01 %
MDC IDC STAT BRADY RV PERCENT PACED: 0.04 %

## 2016-03-08 ENCOUNTER — Encounter: Payer: Self-pay | Admitting: Cardiology

## 2016-04-03 ENCOUNTER — Other Ambulatory Visit: Payer: Self-pay | Admitting: Urology

## 2016-04-03 DIAGNOSIS — R319 Hematuria, unspecified: Secondary | ICD-10-CM

## 2016-04-10 ENCOUNTER — Ambulatory Visit
Admission: RE | Admit: 2016-04-10 | Discharge: 2016-04-10 | Disposition: A | Discharge status: Home / Self Care | Payer: BC Managed Care – PPO | Source: Ambulatory Visit | Attending: Urology | Admitting: Urology

## 2016-04-10 DIAGNOSIS — R319 Hematuria, unspecified: Secondary | ICD-10-CM

## 2016-04-10 MED ORDER — IOPAMIDOL (ISOVUE-300) INJECTION 61%
150.0000 mL | Freq: Once | INTRAVENOUS | Status: AC | PRN
  Administered 2016-04-10: 150 mL via INTRAVENOUS

## 2016-04-28 ENCOUNTER — Other Ambulatory Visit: Payer: Self-pay | Admitting: Internal Medicine

## 2016-05-04 ENCOUNTER — Other Ambulatory Visit: Payer: Self-pay | Admitting: Internal Medicine

## 2016-05-12 ENCOUNTER — Telehealth: Payer: Self-pay | Admitting: Internal Medicine

## 2016-05-12 NOTE — Telephone Encounter (Signed)
Got letter from pharmacy she needs to be on folic acid 1mg  daily or  MVT that has folic acid 1mg  daily due to methotrexate

## 2016-05-12 NOTE — Telephone Encounter (Signed)
Spoke with pt and gave recommendations. Pt will start Folic Acid 1mg  daily. Nothing further needed.

## 2016-05-16 ENCOUNTER — Ambulatory Visit (INDEPENDENT_AMBULATORY_CARE_PROVIDER_SITE_OTHER): Payer: BC Managed Care – PPO | Admitting: *Deleted

## 2016-05-16 DIAGNOSIS — I429 Cardiomyopathy, unspecified: Secondary | ICD-10-CM

## 2016-05-16 DIAGNOSIS — Z95810 Presence of automatic (implantable) cardiac defibrillator: Secondary | ICD-10-CM | POA: Diagnosis not present

## 2016-05-16 NOTE — Progress Notes (Signed)
Remote ICD transmission.   

## 2016-06-04 LAB — CUP PACEART REMOTE DEVICE CHECK
Battery Voltage: 2.94 V
Brady Statistic AP VP Percent: 0 %
Brady Statistic AP VS Percent: 0.01 %
Brady Statistic RV Percent Paced: 0.04 %
HIGH POWER IMPEDANCE MEASURED VALUE: 304 Ohm
HIGH POWER IMPEDANCE MEASURED VALUE: 46 Ohm
HighPow Impedance: 63 Ohm
Implantable Lead Implant Date: 20120308
Implantable Lead Implant Date: 20120308
Implantable Lead Location: 753860
Implantable Lead Model: 6947
Lead Channel Impedance Value: 399 Ohm
Lead Channel Pacing Threshold Amplitude: 0.5 V
Lead Channel Pacing Threshold Pulse Width: 0.4 ms
Lead Channel Pacing Threshold Pulse Width: 0.4 ms
Lead Channel Sensing Intrinsic Amplitude: 3.125 mV
Lead Channel Sensing Intrinsic Amplitude: 6.375 mV
Lead Channel Sensing Intrinsic Amplitude: 6.375 mV
Lead Channel Setting Pacing Amplitude: 2 V
Lead Channel Setting Sensing Sensitivity: 0.45 mV
MDC IDC LEAD LOCATION: 753859
MDC IDC MSMT LEADCHNL RA IMPEDANCE VALUE: 494 Ohm
MDC IDC MSMT LEADCHNL RA SENSING INTR AMPL: 3.125 mV
MDC IDC MSMT LEADCHNL RV PACING THRESHOLD AMPLITUDE: 0.75 V
MDC IDC SESS DTM: 20170523052509
MDC IDC SET LEADCHNL RV PACING AMPLITUDE: 2.5 V
MDC IDC SET LEADCHNL RV PACING PULSEWIDTH: 0.4 ms
MDC IDC STAT BRADY AS VP PERCENT: 0.04 %
MDC IDC STAT BRADY AS VS PERCENT: 99.95 %
MDC IDC STAT BRADY RA PERCENT PACED: 0.01 %

## 2016-06-13 ENCOUNTER — Encounter: Payer: Self-pay | Admitting: Cardiology

## 2016-06-29 ENCOUNTER — Encounter: Payer: Self-pay | Admitting: Cardiology

## 2016-08-15 ENCOUNTER — Ambulatory Visit (INDEPENDENT_AMBULATORY_CARE_PROVIDER_SITE_OTHER): Payer: BC Managed Care – PPO | Admitting: *Deleted

## 2016-08-15 DIAGNOSIS — I429 Cardiomyopathy, unspecified: Secondary | ICD-10-CM

## 2016-08-15 DIAGNOSIS — Z95810 Presence of automatic (implantable) cardiac defibrillator: Secondary | ICD-10-CM

## 2016-08-15 NOTE — Progress Notes (Signed)
Remote ICD transmission.   

## 2016-08-18 ENCOUNTER — Encounter: Payer: Self-pay | Admitting: Cardiology

## 2016-08-24 LAB — CUP PACEART REMOTE DEVICE CHECK
Battery Voltage: 2.91 V
Brady Statistic AP VP Percent: 0 %
Brady Statistic AS VS Percent: 99.94 %
HIGH POWER IMPEDANCE MEASURED VALUE: 42 Ohm
HIGH POWER IMPEDANCE MEASURED VALUE: 52 Ohm
HighPow Impedance: 304 Ohm
Implantable Lead Implant Date: 20120308
Implantable Lead Location: 753860
Lead Channel Impedance Value: 437 Ohm
Lead Channel Pacing Threshold Amplitude: 0.5 V
Lead Channel Pacing Threshold Pulse Width: 0.4 ms
Lead Channel Sensing Intrinsic Amplitude: 4.75 mV
Lead Channel Sensing Intrinsic Amplitude: 4.75 mV
Lead Channel Setting Pacing Amplitude: 2 V
Lead Channel Setting Sensing Sensitivity: 0.45 mV
MDC IDC LEAD IMPLANT DT: 20120308
MDC IDC LEAD LOCATION: 753859
MDC IDC MSMT LEADCHNL RA SENSING INTR AMPL: 2.5 mV
MDC IDC MSMT LEADCHNL RA SENSING INTR AMPL: 2.5 mV
MDC IDC MSMT LEADCHNL RV IMPEDANCE VALUE: 380 Ohm
MDC IDC MSMT LEADCHNL RV PACING THRESHOLD AMPLITUDE: 0.75 V
MDC IDC MSMT LEADCHNL RV PACING THRESHOLD PULSEWIDTH: 0.4 ms
MDC IDC SESS DTM: 20170822084228
MDC IDC SET LEADCHNL RV PACING AMPLITUDE: 2.5 V
MDC IDC SET LEADCHNL RV PACING PULSEWIDTH: 0.4 ms
MDC IDC STAT BRADY AP VS PERCENT: 0.01 %
MDC IDC STAT BRADY AS VP PERCENT: 0.04 %
MDC IDC STAT BRADY RA PERCENT PACED: 0.02 %
MDC IDC STAT BRADY RV PERCENT PACED: 0.04 %

## 2016-10-23 DIAGNOSIS — R109 Unspecified abdominal pain: Secondary | ICD-10-CM | POA: Insufficient documentation

## 2016-11-23 ENCOUNTER — Ambulatory Visit (INDEPENDENT_AMBULATORY_CARE_PROVIDER_SITE_OTHER): Payer: BC Managed Care – PPO | Admitting: Internal Medicine

## 2016-11-23 ENCOUNTER — Encounter: Payer: Self-pay | Admitting: Internal Medicine

## 2016-11-23 VITALS — BP 130/64 | HR 79 | Ht 65.0 in | Wt 229.8 lb

## 2016-11-23 DIAGNOSIS — I429 Cardiomyopathy, unspecified: Secondary | ICD-10-CM

## 2016-11-23 DIAGNOSIS — I472 Ventricular tachycardia, unspecified: Secondary | ICD-10-CM

## 2016-11-23 DIAGNOSIS — Z95810 Presence of automatic (implantable) cardiac defibrillator: Secondary | ICD-10-CM | POA: Diagnosis not present

## 2016-11-23 NOTE — Patient Instructions (Signed)
Medication Instructions:  Your physician recommends that you continue on your current medications as directed. Please refer to the Current Medication list given to you today.   Labwork: none  Testing/Procedures: none  Follow-Up: Your physician wants you to follow-up in: 12 months with Dr. Caryl Comes. You will receive a reminder letter in the mail two months in advance. If you don't receive a letter, please call our office to schedule the follow-up appointment.  Remote monitoring is used to monitor your Pacemaker or ICD from home. This monitoring reduces the number of office visits required to check your device to one time per year. It allows [place] to keep an eye on the functioning of your device to ensure it is working properly. You are scheduled for a device check from home on 02/22/2017. You may send your transmission at any time that day. If you have a wireless device, the transmission will be sent automatically. After your physician reviews your transmission, you will receive a postcard with your next transmission date.   Any Other Special Instructions Will Be Listed Below (If Applicable).     If you need a refill on your cardiac medications before your next appointment, please call your pharmacy.

## 2016-11-23 NOTE — Progress Notes (Signed)
Patient Care Team: Orma Flaming, MD as PCP - General (Internal Medicine)   HPI  Alexandra Hood is a 45 y.o. 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  No arrhythmia  Struggles with dyspnea on exertion pulmonary sarcoid is supposed to be stable; she is struggling with her weight issue and anticipates an effort weight loss which is complicated by her prednisone therapy      Past Medical History:  Diagnosis Date  . 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 Pertussis   . Pseudotumor cerebri    .Marland KitchenMarland KitchenDr Sabra Heck and Dr. Katy Fitch - dxed Jan 2009, on diamox  - LP 01/24/2008 at [facility] - Normal ( pressure 14cm, gluc 68, Protein 23, Clear, colorless, RBC 3, WBC - too few to count)  - CT head non contrast done at [facility] 11/25/2007: "normal". Ddoes not report MRI, MRA, MRV being done - 2nd opinon by Dr. Leonie Man - reportdly had MRI/MRV both normal; followup pending  . Pulmonary sarcoidosis ([place])    - Spiro 07/25/2010 on pred 10/mtx 10 -> FEv1 1.84L/775%, FVC 2.25L - SPiro 09/06/2010 -> Fev1 2.14L/81%. Cut pred to 5mg  per day. Cont mtx - SPiro 01/20/2011 - Fev1 1.6L/61%, Ratio 74%. CXR Worse -> increased pred to 10mg  perday. Cont Mtx.    . Ventricular tachycardia ([place])   . Vitamin D deficiency    - Aug 2011: 22 and low -> commence Rx. - Jan 2012: 11    Past Surgical History:  Procedure Laterality Date  . CARDIAC DEFIBRILLATOR PLACEMENT  march 2012   Medtronic  . ESOPHAGOGASTRODUODENOSCOPY (EGD) WITH PROPOFOL N/A 02/25/2016   Procedure: ESOPHAGOGASTRODUODENOSCOPY (EGD) WITH PROPOFOL;  Surgeon: Laurence Spates, MD;  Location: [facility] END[facility]COPY;  Service: Endoscopy;  Laterality: N/A;  . 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  acetaminophen (TYLENOL) 500 MG tablet Take 1,000 mg by mouth 3 (three) times daily as needed (pain).    Marland Kitchen acetaZOLAMIDE (DIAMOX) 250 MG tablet Take 2 tablets (500 mg total) by mouth 2 (two) times daily. 120 tablet 1  . metaxalone (SKELAXIN) 800 MG tablet Take 1 tablet (800 mg total) by mouth at bedtime. 7 tablet 1  . methotrexate (RHEUMATREX) 2.5 MG tablet Take 2 tablets (5 mg total) by mouth once a week. Caution:Chemotherapy. Protect from light. 8 tablet 5  . methylPREDNISolone (MEDROL) 4 MG tablet TAKE 1 TABLET BY MOUTH EVERY DAY, CONTINUE UNTIL STOPPED BY MD 30 tablet 3  . metoprolol succinate (TOPROL-XL) 25 MG 24 hr tablet Take 1 tablet (25 mg total) by mouth daily. 30 tablet 6  . omeprazole (PRILOSEC) 20 MG capsule Take 1 capsule (20 mg total) by mouth daily. 30 capsule 3  . QVAR 80 MCG/ACT inhaler INHALE 2 PUFFS TWICE DAILY 8.7 g 2  . sucralfate (CARAFATE) 1 GM/10ML suspension Take 10 mLs (1 g total) by mouth 4 (four) times daily -  with meals and at bedtime. 420 mL 0  . sulfamethoxazole-trimethoprim (BACTRIM DS,SEPTRA DS) 800-160 MG tablet Take 1 tablet by mouth 3 (three) times a week. 12 tablet 5   No current facility-administered medications for this visit.     No Known Allergies  Review of Systems negative except from HPI and PMH  Physical Exam  BP 130/64   Pulse 79   Ht 5\' 5"  (1.651 m)   Wt 229 lb 12.8 oz (104.2 kg)   SpO2 99%   BMI 38.24 kg/m  Well developed and well nourished in no acute distress HENT normal E scleral and icterus clear Neck Supple JVP flat; carotids brisk and full The patient's device was interrogated.  The information was reviewed. No changes were made in the programming.   Clear to ausculation  Regular rate and rhythm, no murmurs gallops or rub Soft with active bowel sounds No clubbing cyanosis  Edema Alert and oriented, grossly normal motor and sensory function Skin Warm and Dry  ECG 87 17/07/37 Axis XXXIX  Assessment and  Plan    Ventricular tachycardia   Implantable defibrillator-Medtronic  Cardiomyopathy-sarcoid  Pain at device site   Dypsnea on exertion   Morbidly obese   Discussed lightheadedness and role of fluid repletion   No intercurrent Ventricular tachycardia  Encourage weight loss

## 2016-11-24 LAB — CUP PACEART INCLINIC DEVICE CHECK
Battery Voltage: 2.83 V
Brady Statistic AP VP Percent: 0 %
Brady Statistic RA Percent Paced: 0.01 %
Brady Statistic RV Percent Paced: 0.04 %
Date Time Interrogation Session: 20171130212002
HIGH POWER IMPEDANCE MEASURED VALUE: 304 Ohm
HIGH POWER IMPEDANCE MEASURED VALUE: 43 Ohm
HIGH POWER IMPEDANCE MEASURED VALUE: 58 Ohm
Implantable Lead Implant Date: 20120308
Implantable Lead Location: 753860
Implantable Lead Model: 5076
Implantable Lead Model: 6947
Implantable Pulse Generator Implant Date: 20120308
Lead Channel Impedance Value: 399 Ohm
Lead Channel Pacing Threshold Amplitude: 0.5 V
Lead Channel Sensing Intrinsic Amplitude: 2.8 mV
Lead Channel Sensing Intrinsic Amplitude: 6.3 mV
MDC IDC LEAD IMPLANT DT: 20120308
MDC IDC LEAD LOCATION: 753859
MDC IDC MSMT LEADCHNL RA IMPEDANCE VALUE: 456 Ohm
MDC IDC MSMT LEADCHNL RA PACING THRESHOLD PULSEWIDTH: 0.4 ms
MDC IDC MSMT LEADCHNL RV PACING THRESHOLD AMPLITUDE: 0.75 V
MDC IDC MSMT LEADCHNL RV PACING THRESHOLD PULSEWIDTH: 0.4 ms
MDC IDC SET LEADCHNL RA PACING AMPLITUDE: 2 V
MDC IDC SET LEADCHNL RV PACING AMPLITUDE: 2.5 V
MDC IDC SET LEADCHNL RV PACING PULSEWIDTH: 0.4 ms
MDC IDC SET LEADCHNL RV SENSING SENSITIVITY: 0.45 mV
MDC IDC STAT BRADY AP VS PERCENT: 0.01 %
MDC IDC STAT BRADY AS VP PERCENT: 0.04 %
MDC IDC STAT BRADY AS VS PERCENT: 99.95 %

## 2016-12-29 ENCOUNTER — Ambulatory Visit (INDEPENDENT_AMBULATORY_CARE_PROVIDER_SITE_OTHER): Payer: BC Managed Care – PPO | Admitting: Physician Assistant

## 2016-12-29 VITALS — BP 140/84 | HR 102 | Temp 98.9°F | Resp 18 | Ht 65.0 in | Wt 221.4 lb

## 2016-12-29 DIAGNOSIS — M5441 Lumbago with sciatica, right side: Secondary | ICD-10-CM | POA: Diagnosis not present

## 2016-12-29 MED ORDER — CYCLOBENZAPRINE HCL 10 MG PO TABS: 5.0000 mg | ORAL_TABLET | Freq: Every day | ORAL | 0 refills | Status: DC

## 2016-12-29 MED ORDER — MELOXICAM 7.5 MG PO TABS: 7.5000 mg | ORAL_TABLET | Freq: Every day | ORAL | 0 refills | Status: DC

## 2016-12-29 NOTE — Patient Instructions (Signed)
     IF you received an x-ray today, you will receive an invoice from [facility]. Please contact [facility] at [phone] with questions or concerns regarding your invoice.   IF you received labwork today, you will receive an invoice from [facility]. Please contact [facility] at [phone] with questions or concerns regarding your invoice.   Our billing staff will not be able to assist you with questions regarding bills from these companies.  You will be contacted with the lab results as soon as they are available. The fastest way to get your results is to activate your My Chart account. Instructions are located on the last page of this paperwork. If you have not heard from us regarding the results in 2 weeks, please contact this office.     

## 2016-12-29 NOTE — Progress Notes (Signed)
01/01/2017 8:27 AM   DOB: December 19, 1971 / MRN: [id number]  SUBJECTIVE:  Alexandra Hood is a 46 y.o. female presenting for sharp left gluteal pain that started yesterday and has worsened.  Reports she can not escape the pain.  She does report some mild right foot numbness that started today. She has a long history of low back pain. Denies any trauma.   She has No Known Allergies.   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    She  reports that she quit smoking about 9 years ago. Her smoking use included Cigarettes. She has a 15.00 pack-year smoking history. She has never used smokeless tobacco. She reports that she does not drink alcohol or use drugs. She  reports that she does not currently engage in sexual activity. The patient  has a past surgical history that includes Oophorectomy (05 & 08); Cardiac defibrillator placement (march 2012); and Esophagogastroduodenoscopy (egd) with propofol (N/A, 02/25/2016).  Her family history includes Allergies in her mother; COPD in her maternal grandfather; Diabetes in her mother; Heart attack in her paternal grandfather; Hyperlipidemia in her father, mother, and sister; Hypertension in her father and mother; Leukemia in her paternal grandmother.  Review of Systems  Constitutional: Negative for chills and fever.  Respiratory: Negative for shortness of breath.   Gastrointestinal: Negative for abdominal pain, nausea and vomiting.  Genitourinary: Negative for dysuria, frequency and urgency.  Musculoskeletal: Positive for back pain and myalgias.  Skin: Negative for rash.  Neurological: Negative for dizziness, tingling, focal weakness and headaches.  Psychiatric/Behavioral: The patient does not have insomnia.     The problem list and medications were reviewed and updated by myself where necessary and exist elsewhere in the  encounter.   OBJECTIVE:  BP 140/84 (BP Location: Right Arm, Patient Position: Sitting, Cuff Size: Large)   Pulse (!) 102   Temp 98.9 F (37.2 C) (Oral)   Resp 18   Ht 5\' 5"  (1.651 m)   Wt 221 lb 6.4 oz (100.4 kg)   SpO2 97%   BMI 36.84 kg/m   Physical Exam  Constitutional: She is oriented to person, place, and time. She appears well-nourished. No distress.  Eyes: EOM are normal. Pupils are equal, round, and reactive to light.  Cardiovascular: Normal rate.   Pulmonary/Chest: Effort normal.  Abdominal: She exhibits no distension.  Neurological: She is alert and oriented to person, place, and time. She displays normal reflexes. No cranial nerve deficit. She exhibits normal muscle tone. Coordination and gait normal.  Skin: Skin is dry. She is not diaphoretic.  Psychiatric: She has a normal mood and affect.  Vitals reviewed.   No results found for this or any previous visit (from the past 72 hour(s)).  No results found.  ASSESSMENT AND PLAN:  [place] was seen today for back pain.  Diagnoses and all orders for this visit:  Acute right-sided low back pain with right-sided sciatica -     meloxicam (MOBIC) 7.5 MG tablet; Take 1 tablet (7.5 mg total) by mouth daily. -     cyclobenzaprine (FLEXERIL) 10 MG tablet; Take 0.5-1 tablets (5-10 mg total) by mouth at bedtime.    The patient is advised to call or return to clinic if she does not see an improvement in symptoms, or to seek the care of the closest emergency department if she worsens with the above plan.   Philis Fendt, MHS, PA-C [facility] 01/01/2017 8:27 AM

## 2017-02-22 ENCOUNTER — Ambulatory Visit (INDEPENDENT_AMBULATORY_CARE_PROVIDER_SITE_OTHER): Payer: BC Managed Care – PPO | Admitting: *Deleted

## 2017-02-22 DIAGNOSIS — I472 Ventricular tachycardia, unspecified: Secondary | ICD-10-CM

## 2017-02-22 NOTE — Progress Notes (Signed)
Remote ICD transmission.   

## 2017-02-27 ENCOUNTER — Encounter: Payer: Self-pay | Admitting: Cardiology

## 2017-02-27 LAB — CUP PACEART REMOTE DEVICE CHECK
Battery Voltage: 2.78 V
Brady Statistic AP VP Percent: 0 %
Brady Statistic RA Percent Paced: 0.01 %
Date Time Interrogation Session: 20180301124230
HIGH POWER IMPEDANCE MEASURED VALUE: 41 Ohm
HIGH POWER IMPEDANCE MEASURED VALUE: 52 Ohm
HighPow Impedance: 304 Ohm
Implantable Lead Implant Date: 20120308
Implantable Lead Location: 753859
Implantable Pulse Generator Implant Date: 20120308
Lead Channel Impedance Value: 380 Ohm
Lead Channel Impedance Value: 437 Ohm
Lead Channel Pacing Threshold Pulse Width: 0.4 ms
Lead Channel Sensing Intrinsic Amplitude: 6.625 mV
Lead Channel Setting Pacing Amplitude: 2 V
Lead Channel Setting Pacing Pulse Width: 0.4 ms
MDC IDC LEAD IMPLANT DT: 20120308
MDC IDC LEAD LOCATION: 753860
MDC IDC MSMT LEADCHNL RA PACING THRESHOLD AMPLITUDE: 0.375 V
MDC IDC MSMT LEADCHNL RA SENSING INTR AMPL: 2.25 mV
MDC IDC MSMT LEADCHNL RA SENSING INTR AMPL: 2.25 mV
MDC IDC MSMT LEADCHNL RV PACING THRESHOLD AMPLITUDE: 0.75 V
MDC IDC MSMT LEADCHNL RV PACING THRESHOLD PULSEWIDTH: 0.4 ms
MDC IDC MSMT LEADCHNL RV SENSING INTR AMPL: 6.625 mV
MDC IDC SET LEADCHNL RV PACING AMPLITUDE: 2.5 V
MDC IDC SET LEADCHNL RV SENSING SENSITIVITY: 0.45 mV
MDC IDC STAT BRADY AP VS PERCENT: 0.01 %
MDC IDC STAT BRADY AS VP PERCENT: 0.04 %
MDC IDC STAT BRADY AS VS PERCENT: 99.95 %
MDC IDC STAT BRADY RV PERCENT PACED: 0.04 %

## 2017-05-11 ENCOUNTER — Ambulatory Visit (INDEPENDENT_AMBULATORY_CARE_PROVIDER_SITE_OTHER): Payer: BC Managed Care – PPO | Admitting: Physician Assistant

## 2017-05-11 VITALS — BP 124/76 | HR 92 | Temp 98.7°F | Resp 16 | Ht 64.76 in | Wt 222.8 lb

## 2017-05-11 DIAGNOSIS — G44209 Tension-type headache, unspecified, not intractable: Secondary | ICD-10-CM

## 2017-05-11 MED ORDER — IBUPROFEN 600 MG PO TABS
600.0000 mg | ORAL_TABLET | Freq: Three times a day (TID) | ORAL | 0 refills | Status: DC | PRN
Start: 2017-05-11 — End: 2018-09-05

## 2017-05-11 MED ORDER — CYCLOBENZAPRINE HCL 10 MG PO TABS: 5.0000 mg | ORAL_TABLET | Freq: Three times a day (TID) | ORAL | 0 refills | Status: DC | PRN

## 2017-05-11 MED ORDER — KETOROLAC TROMETHAMINE 60 MG/2ML IM SOLN
60.0000 mg | Freq: Once | INTRAMUSCULAR | Status: AC
  Administered 2017-05-11: 60 mg via INTRAMUSCULAR

## 2017-05-11 NOTE — Progress Notes (Signed)
05/11/2017 4:14 PM   DOB: 05/27/1971 / MRN: [id number]  SUBJECTIVE:  Alexandra Hood is a 46 y.o. female presenting for biparental and occipital lobe HA and numbness in the left arm. She denies weakness. She associates neck pain on the left side as well.. She has a history of pseudotumor cerebri.  Tells me that this HA started last night and seemed to get better with rest however the HA came back after going to work this morning. She tells me that she is also having shock like sensations (while smiling). She has tried diamox, Excedrin and tylenol. Tells me the exedrin worked best and even allowed her to make it through her shift today.   She has No Known Allergies.   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); and Vitamin D deficiency.    She  reports that she quit smoking about 10 years ago. Her smoking use included Cigarettes. She has a 15.00 pack-year smoking history. She has never used smokeless tobacco. She reports that she does not drink alcohol or use drugs. She  reports that she does not currently engage in sexual activity. The patient  has a past surgical history that includes Oophorectomy (05 & 08); Cardiac defibrillator placement (march 2012); and Esophagogastroduodenoscopy (egd) with propofol (N/A, 02/25/2016).  Her family history includes Allergies in her mother; COPD in her maternal grandfather; Diabetes in her mother; Heart attack in her paternal grandfather; Hyperlipidemia in her father, mother, and sister; Hypertension in her father and mother; Leukemia in her paternal grandmother.  Review of Systems  Constitutional: Negative for chills and fever.  Cardiovascular: Negative for chest pain.  Genitourinary: Negative for dysuria.  Musculoskeletal: Positive for neck pain. Negative for back pain, falls, joint pain and myalgias.  Skin: Negative for itching and rash.  Neurological:  Positive for tingling and headaches. Negative for dizziness, tremors, sensory change, speech change, focal weakness, seizures and loss of consciousness.    The problem list and medications were reviewed and updated by myself where necessary and exist elsewhere in the encounter.   OBJECTIVE:  BP 124/76   Pulse 92   Temp 98.7 F (37.1 C) (Oral)   Resp 16   Ht 5' 4.76" (1.645 m)   Wt 222 lb 12.8 oz (101.1 kg)   SpO2 96%   BMI 37.35 kg/m f  Physical Exam  Constitutional: She is oriented to person, place, and time. She is active.  Non-toxic appearance.  Eyes: Conjunctivae and EOM are normal. Pupils are equal, round, and reactive to light.  Vision 20/25 bilaterally. No photophobia on pupil examination  Cardiovascular: Normal rate.   Pulmonary/Chest: Effort normal. No tachypnea.  Abdominal: Soft. Normal appearance and bowel sounds are normal. She exhibits no mass. There is no tenderness. There is no rigidity, no rebound, no guarding and no CVA tenderness.  Neurological: She is alert and oriented to person, place, and time. She has normal strength and normal reflexes. She is not disoriented. She displays no atrophy. No cranial nerve deficit or sensory deficit. She exhibits normal muscle tone. Coordination and gait normal.  Skin: Skin is warm and dry. She is not diaphoretic. No pallor.  Psychiatric: Her behavior is normal.     No results found for this or any previous visit (from the past 72 hour(s)).  No results found.  ASSESSMENT AND PLAN:  Alexandra Hood was seen today for migraine.  Diagnoses and all orders for this visit:  Acute non  intractable tension-type headache: She has all the features of a tension type HA.  Given neck pain and paresthesia advised a course of ibuprofen and flexeril as well.  No signs of stroke.  Pseudotumor historically well controlled on diamox and no vision changes today.  -     ketorolac (TORADOL) injection 60 mg; Inject 2 mLs (60 mg total) into the muscle  once. -     ibuprofen (ADVIL,MOTRIN) 600 MG tablet; Take 1 tablet (600 mg total) by mouth every 8 (eight) hours as needed. -     cyclobenzaprine (FLEXERIL) 10 MG tablet; Take 0.5-1 tablets (5-10 mg total) by mouth 3 (three) times daily as needed for muscle spasms. -     Care order/instruction:    The patient is advised to call or return to clinic if she does not see an improvement in symptoms, or to seek the care of the closest emergency department if she worsens with the above plan.   Philis Fendt, MHS, PA-C [facility] 05/11/2017 4:14 PM

## 2017-05-11 NOTE — Patient Instructions (Signed)
     IF you received an x-ray today, you will receive an invoice from [facility]. Please contact [facility] at [phone] with questions or concerns regarding your invoice.   IF you received labwork today, you will receive an invoice from [facility]. Please contact [facility] at [phone] with questions or concerns regarding your invoice.   Our billing staff will not be able to assist you with questions regarding bills from these companies.  You will be contacted with the lab results as soon as they are available. The fastest way to get your results is to activate your My Chart account. Instructions are located on the last page of this paperwork. If you have not heard from us regarding the results in 2 weeks, please contact this office.     

## 2017-05-24 ENCOUNTER — Ambulatory Visit (INDEPENDENT_AMBULATORY_CARE_PROVIDER_SITE_OTHER): Payer: BC Managed Care – PPO | Admitting: *Deleted

## 2017-05-24 DIAGNOSIS — I472 Ventricular tachycardia, unspecified: Secondary | ICD-10-CM

## 2017-05-25 NOTE — Progress Notes (Signed)
Remote ICD transmission.   

## 2017-05-31 LAB — CUP PACEART REMOTE DEVICE CHECK
Brady Statistic RA Percent Paced: 0 %
Brady Statistic RV Percent Paced: 0 %
Date Time Interrogation Session: 20180607091411
HIGH POWER IMPEDANCE MEASURED VALUE: 43 Ohm
Implantable Lead Implant Date: 20120308
Implantable Lead Location: 753859
Implantable Lead Model: 5076
Lead Channel Impedance Value: 437 Ohm
Lead Channel Pacing Threshold Pulse Width: 0.4 ms
Lead Channel Sensing Intrinsic Amplitude: 2 mV
Lead Channel Setting Pacing Amplitude: 2 V
Lead Channel Setting Pacing Amplitude: 2.5 V
MDC IDC LEAD IMPLANT DT: 20120308
MDC IDC LEAD LOCATION: 753860
MDC IDC MSMT LEADCHNL RA PACING THRESHOLD AMPLITUDE: 0.375 V
MDC IDC MSMT LEADCHNL RA PACING THRESHOLD PULSEWIDTH: 0.4 ms
MDC IDC MSMT LEADCHNL RV IMPEDANCE VALUE: 399 Ohm
MDC IDC MSMT LEADCHNL RV PACING THRESHOLD AMPLITUDE: 0.75 V
MDC IDC MSMT LEADCHNL RV SENSING INTR AMPL: 7.1 mV
MDC IDC PG IMPLANT DT: 20120308
MDC IDC SET LEADCHNL RV PACING PULSEWIDTH: 0.4 ms
MDC IDC SET LEADCHNL RV SENSING SENSITIVITY: 0.45 mV

## 2017-06-01 ENCOUNTER — Encounter: Payer: Self-pay | Admitting: Cardiology

## 2017-08-23 ENCOUNTER — Ambulatory Visit (INDEPENDENT_AMBULATORY_CARE_PROVIDER_SITE_OTHER): Payer: BC Managed Care – PPO | Admitting: *Deleted

## 2017-08-23 DIAGNOSIS — I472 Ventricular tachycardia, unspecified: Secondary | ICD-10-CM

## 2017-08-23 NOTE — Progress Notes (Signed)
Remote ICD transmission.   

## 2017-09-04 ENCOUNTER — Encounter: Payer: Self-pay | Admitting: Cardiology

## 2017-09-17 LAB — CUP PACEART REMOTE DEVICE CHECK
Implantable Lead Implant Date: 20120308
Implantable Lead Model: 6947
Lead Channel Setting Pacing Amplitude: 2.5 V
Lead Channel Setting Sensing Sensitivity: 0.45 mV
MDC IDC LEAD IMPLANT DT: 20120308
MDC IDC LEAD LOCATION: 753859
MDC IDC LEAD LOCATION: 753860
MDC IDC PG IMPLANT DT: 20120308
MDC IDC SESS DTM: 20180924145225
MDC IDC SET LEADCHNL RA PACING AMPLITUDE: 2 V
MDC IDC SET LEADCHNL RV PACING PULSEWIDTH: 0.4 ms

## 2017-10-10 ENCOUNTER — Ambulatory Visit (INDEPENDENT_AMBULATORY_CARE_PROVIDER_SITE_OTHER): Payer: BC Managed Care – PPO | Admitting: Physician Assistant

## 2017-10-10 ENCOUNTER — Encounter: Payer: Self-pay | Admitting: Physician Assistant

## 2017-10-10 VITALS — BP 144/88 | HR 83 | Temp 97.7°F | Resp 16 | Ht 64.75 in | Wt 224.0 lb

## 2017-10-10 DIAGNOSIS — G43009 Migraine without aura, not intractable, without status migrainosus: Secondary | ICD-10-CM | POA: Diagnosis not present

## 2017-10-10 DIAGNOSIS — G932 Benign intracranial hypertension: Secondary | ICD-10-CM | POA: Diagnosis not present

## 2017-10-10 MED ORDER — ACETAZOLAMIDE 125 MG PO TABS: 125.0000 mg | ORAL_TABLET | Freq: Two times a day (BID) | ORAL | 0 refills | Status: DC

## 2017-10-10 MED ORDER — KETOROLAC TROMETHAMINE 60 MG/2ML IM SOLN
60.0000 mg | Freq: Once | INTRAMUSCULAR | Status: AC
  Administered 2017-10-10: 60 mg via INTRAMUSCULAR

## 2017-10-10 NOTE — Patient Instructions (Signed)
     IF you received an x-ray today, you will receive an invoice from [facility]. Please contact  Radiology at [phone] with questions or concerns regarding your invoice.   IF you received labwork today, you will receive an invoice from [facility]. Please contact [facility] at [phone] with questions or concerns regarding your invoice.   Our billing staff will not be able to assist you with questions regarding bills from these companies.  You will be contacted with the lab results as soon as they are available. The fastest way to get your results is to activate your My Chart account. Instructions are located on the last page of this paperwork. If you have not heard from us regarding the results in 2 weeks, please contact this office.     

## 2017-10-10 NOTE — Progress Notes (Signed)
10/11/2017 1:51 PM   DOB: [birth year] / MRN: [id number]  SUBJECTIVE:  Alexandra Hood is a 46 y.o. female presenting for HA. Left sided at this time. Light does bother the HA as well as sound.  Denies nausea right now but did feel nauseas earlier today. Tells me that she is out of her diamox and has been out now for about 3 weeks. Tells me that her HA are always worse when she is off of her diamox.  No weeknesss in her hands and feet at this time.   The synopsis for that problem is as follows:   Pseudotumor cerebri      .Marland KitchenMarland KitchenDr Sabra Heck and Dr. Katy Fitch - dxed Jan 2009, on diamox  - LP 01/24/2008 at [facility] - Normal ( pressure 14cm, gluc 68, Protein 23, Clear, colorless, RBC 3, WBC - too few to count)  - CT head non contrast done at [facility] 11/25/2007: "normal". Ddoes not report MRI, MRA, MRV being done - 2nd opinon by Dr. Leonie Man - reportdly had MRI/MRV both normal; followup pending     She has No Known Allergies.   She  has a past medical history of Lupus pernio; Pertussis; Pseudotumor cerebri; Pulmonary sarcoidosis ([place]); Ventricular tachycardia ([place]); and Vitamin D deficiency.    She  reports that she quit smoking about 10 years ago. Her smoking use included Cigarettes. She has a 15.00 pack-year smoking history. She has never used smokeless tobacco. She reports that she does not drink alcohol or use drugs. She  reports that she does not currently engage in sexual activity. The patient  has a past surgical history that includes Oophorectomy (05 & 08); Cardiac defibrillator placement (march 2012); and Esophagogastroduodenoscopy (egd) with propofol (N/A, 02/25/2016).  Her family history includes Allergies in her mother; COPD in her maternal grandfather; Diabetes in her mother; Heart attack in her paternal grandfather; Hyperlipidemia in her father, mother, and sister; Hypertension in her father and mother; Leukemia in her paternal grandmother.  Review of Systems  Constitutional: Negative  for chills, diaphoresis and fever.  Eyes: Negative.   Respiratory: Negative for cough, hemoptysis, sputum production, shortness of breath and wheezing.   Cardiovascular: Negative for chest pain, orthopnea and leg swelling.  Gastrointestinal: Negative for nausea.  Skin: Negative for rash.  Neurological: Positive for headaches. Negative for dizziness, tingling, tremors, sensory change, speech change, focal weakness, seizures and loss of consciousness.    The problem list and medications were reviewed and updated by myself where necessary and exist elsewhere in the encounter.   OBJECTIVE:  BP (!) 144/88 (BP Location: Right Arm, Patient Position: Sitting, Cuff Size: Large)   Pulse 83   Temp 97.7 F (36.5 C) (Oral)   Resp 16   Ht 5' 4.75" (1.645 m)   Wt 224 lb (101.6 kg)   SpO2 98%   BMI 37.56 kg/m   Lab Results  Component Value Date   CREATININE 0.68 10/10/2017     Physical Exam  Constitutional: She is oriented to person, place, and time. She is active.  Non-toxic appearance.  Eyes: Pupils are equal, round, and reactive to light. EOM are normal.  Cardiovascular: Normal rate, regular rhythm, S1 normal, S2 normal, normal heart sounds and intact distal pulses.  Exam reveals no gallop, no friction rub and no decreased pulses.   No murmur heard. Pulmonary/Chest: Effort normal. No stridor. No tachypnea. No respiratory distress. She has no wheezes. She has no rales.  Abdominal: She exhibits no distension.  Musculoskeletal: She  exhibits no edema, tenderness or deformity.  Neurological: She is alert and oriented to person, place, and time. She has normal strength and normal reflexes. She is not disoriented. She displays no atrophy. No cranial nerve deficit or sensory deficit. She exhibits normal muscle tone. Coordination and gait normal.  Skin: Skin is warm and dry. She is not diaphoretic. No pallor.  Psychiatric: Her behavior is normal.    Results for orders placed or performed in  visit on 10/10/17 (from the past 72 hour(s))  CBC     Status: Abnormal   Collection Time: 10/10/17  5:32 PM  Result Value Ref Range   WBC 9.5 3.4 - 10.8 x10E3/uL   RBC 4.44 3.77 - 5.28 x10E6/uL   Hemoglobin 12.3 11.1 - 15.9 g/dL   Hematocrit 37.2 34.0 - 46.6 %   MCV 84 79 - 97 fL   MCH 27.7 26.6 - 33.0 pg   MCHC 33.1 31.5 - 35.7 g/dL   RDW 14.6 12.3 - 15.4 %   Platelets 519 (H) 150 - 379 x10E3/uL  TSH     Status: None   Collection Time: 10/10/17  5:32 PM  Result Value Ref Range   TSH 1.670 0.450 - 4.500 uIU/mL  Hemoglobin A1c     Status: Abnormal   Collection Time: 10/10/17  5:32 PM  Result Value Ref Range   Hgb A1c MFr Bld 6.0 (H) 4.8 - 5.6 %    Comment:          Prediabetes: 5.7 - 6.4          Diabetes: >6.4          Glycemic control for adults with diabetes: <7.0    Est. average glucose Bld gHb Est-mCnc 126 mg/dL  Basic metabolic panel     Status: None   Collection Time: 10/10/17  5:32 PM  Result Value Ref Range   Glucose 94 65 - 99 mg/dL   BUN 10 6 - 24 mg/dL   Creatinine, Ser 0.68 0.57 - 1.00 mg/dL   GFR calc non Af Amer 107 >59 mL/min/1.73   GFR calc Af Amer 123 >59 mL/min/1.73   BUN/Creatinine Ratio 15 9 - 23   Sodium 141 134 - 144 mmol/L   Potassium 4.1 3.5 - 5.2 mmol/L   Chloride 101 96 - 106 mmol/L   CO2 26 20 - 29 mmol/L   Calcium 9.5 8.7 - 10.2 mg/dL    No results found.  ASSESSMENT AND PLAN:  Nanci was seen today for migraine.  Diagnoses and all orders for this visit:  Migraine without aura and without status migrainosus, not intractable: No vision changes today.  -     ketorolac (TORADOL) injection 60 mg; Inject 2 mLs (60 mg total) into the muscle once. -     CBC -     TSH -     Hemoglobin A1c -     Basic metabolic panel -     Ambulatory referral to Neurology  Pseudotumor cerebri: Will restart her medication but at a lower dose.  She needs to follow up with neurology for this.  -     acetaZOLAMIDE (DIAMOX) 125 MG tablet; Take 1 tablet (125 mg  total) by mouth 2 (two) times daily.    The patient is advised to call or return to clinic if she does not see an improvement in symptoms, or to seek the care of the closest emergency department if she worsens with the above plan.   Philis Fendt, MHS,  PA-C [facility] [place] 10/11/2017 1:51 PM

## 2017-10-11 LAB — CBC
HEMATOCRIT: 37.2 % (ref 34.0–46.6)
HEMOGLOBIN: 12.3 g/dL (ref 11.1–15.9)
MCH: 27.7 pg (ref 26.6–33.0)
MCHC: 33.1 g/dL (ref 31.5–35.7)
MCV: 84 fL (ref 79–97)
Platelets: 519 10*3/uL — ABNORMAL HIGH (ref 150–379)
RBC: 4.44 x10E6/uL (ref 3.77–5.28)
RDW: 14.6 % (ref 12.3–15.4)
WBC: 9.5 10*3/uL (ref 3.4–10.8)

## 2017-10-11 LAB — BASIC METABOLIC PANEL
BUN / CREAT RATIO: 15 (ref 9–23)
BUN: 10 mg/dL (ref 6–24)
CHLORIDE: 101 mmol/L (ref 96–106)
CO2: 26 mmol/L (ref 20–29)
Calcium: 9.5 mg/dL (ref 8.7–10.2)
Creatinine, Ser: 0.68 mg/dL (ref 0.57–1.00)
GFR calc Af Amer: 123 mL/min/{1.73_m2} (ref >59)
GFR calc non Af Amer: 107 mL/min/{1.73_m2} (ref >59)
GLUCOSE: 94 mg/dL (ref 65–99)
Potassium: 4.1 mmol/L (ref 3.5–5.2)
Sodium: 141 mmol/L (ref 134–144)

## 2017-10-11 LAB — HEMOGLOBIN A1C
ESTIMATED AVERAGE GLUCOSE: 126 mg/dL
Hgb A1c MFr Bld: 6 % — ABNORMAL HIGH (ref 4.8–5.6)

## 2017-10-11 LAB — TSH: TSH: 1.67 u[IU]/mL (ref 0.450–4.500)

## 2017-10-22 ENCOUNTER — Encounter: Payer: Self-pay | Admitting: Neurology

## 2017-11-13 DIAGNOSIS — G932 Benign intracranial hypertension: Secondary | ICD-10-CM | POA: Insufficient documentation

## 2017-11-13 DIAGNOSIS — I472 Ventricular tachycardia, unspecified: Secondary | ICD-10-CM | POA: Insufficient documentation

## 2017-11-13 DIAGNOSIS — A3790 Whooping cough, unspecified species without pneumonia: Secondary | ICD-10-CM | POA: Insufficient documentation

## 2017-11-22 ENCOUNTER — Ambulatory Visit (INDEPENDENT_AMBULATORY_CARE_PROVIDER_SITE_OTHER): Payer: BC Managed Care – PPO | Admitting: *Deleted

## 2017-11-22 DIAGNOSIS — I472 Ventricular tachycardia, unspecified: Secondary | ICD-10-CM

## 2017-11-22 NOTE — Progress Notes (Signed)
Remote ICD transmission.   

## 2017-11-23 ENCOUNTER — Encounter: Payer: Self-pay | Admitting: Cardiology

## 2017-11-26 ENCOUNTER — Encounter: Payer: Self-pay | Admitting: Internal Medicine

## 2017-11-26 ENCOUNTER — Encounter: Payer: Self-pay | Admitting: *Deleted

## 2017-11-26 ENCOUNTER — Ambulatory Visit: Payer: BC Managed Care – PPO | Admitting: Internal Medicine

## 2017-11-26 DIAGNOSIS — I472 Ventricular tachycardia, unspecified: Secondary | ICD-10-CM

## 2017-11-26 DIAGNOSIS — Z95810 Presence of automatic (implantable) cardiac defibrillator: Secondary | ICD-10-CM | POA: Diagnosis not present

## 2017-11-26 DIAGNOSIS — I429 Cardiomyopathy, unspecified: Secondary | ICD-10-CM

## 2017-11-26 NOTE — Patient Instructions (Signed)
Medication Instructions:  Your physician recommends that you continue on your current medications as directed. Please refer to the Current Medication list given to you today.  *If you need a refill on your cardiac medications before your next appointment, please call your pharmacy*  Labwork: None ordered  Testing/Procedures: None ordered  Follow-Up: Remote monitoring is used to monitor your Pacemaker or ICD from home. This monitoring reduces the number of office visits required to check your device to one time per year. It allows [place] to keep an eye on the functioning of your device to ensure it is working properly. You are scheduled for a device check from home on 02/21/2018. You may send your transmission at any time that day. If you have a wireless device, the transmission will be sent automatically. After your physician reviews your transmission, you will receive a postcard with your next transmission date.  Your physician wants you to follow-up in: 1 year with Dr. Caryl Comes.  You will receive a reminder letter in the mail two months in advance. If you don't receive a letter, please call our office to schedule the follow-up appointment.  Thank you for choosing [facility]!!   Trinidad Curet, RN [phone]  Any Other Special Instructions Will Be Listed Below (If Applicable).

## 2017-11-26 NOTE — Progress Notes (Signed)
Patient Care Team: Shawnee Knapp, MD as PCP - General (Family Medicine) Deboraha Sprang, MD as Consulting Physician (Cardiology)   HPI  Alexandra Hood is a 46 y.o. 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  Her biggest complaint is fatigue.  She is trying to exercise.  She does not have edema.  She does have mild shortness of breath and is being followed by Dr. MR  No interval arrhythmia which she is aware No peripheral edema.  No nocturnal dyspnea.      Past Medical History:  Diagnosis Date  . Lupus pernio    -skin bx confirmed at [facility] 05/07/2009 (personally reviewed) -looks improved 07/09/2009 -  . Pertussis   . Pseudotumor cerebri    .Marland KitchenMarland KitchenDr Sabra Heck and Dr. Katy Fitch - dxed Jan 2009, on diamox  - LP 01/24/2008 at [facility] - Normal ( pressure 14cm, gluc 68, Protein 23, Clear, colorless, RBC 3, WBC - too few to count)  - CT head non contrast done at [facility] 11/25/2007: "normal". Ddoes not report MRI, MRA, MRV being done - 2nd opinon by Dr. Leonie Man - reportdly had MRI/MRV both normal; followup pending  . Pulmonary sarcoidosis ([place])    - Spiro 07/25/2010 on pred 10/mtx 10 -> FEv1 1.84L/775%, FVC 2.25L - SPiro 09/06/2010 -> Fev1 2.14L/81%. Cut pred to 5mg  per day. Cont mtx - SPiro 01/20/2011 - Fev1 1.6L/61%, Ratio 74%. CXR Worse -> increased pred to 10mg  perday. Cont Mtx.    . Ventricular tachycardia ([place])   . Vitamin D deficiency    - Aug 2011: 22 and low -> commence Rx. - Jan 2012: 11    Past Surgical History:  Procedure Laterality Date  . CARDIAC DEFIBRILLATOR PLACEMENT  march 2012   Medtronic  . ESOPHAGOGASTRODUODENOSCOPY (EGD) WITH PROPOFOL N/A 02/25/2016   Procedure: ESOPHAGOGASTRODUODENOSCOPY (EGD) WITH PROPOFOL;  Surgeon: Laurence Spates, MD;  Location: [facility];  Service: Endoscopy;  Laterality: N/A;  . 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 acetaminophen (TYLENOL) 500 MG tablet Take 1,000 mg by mouth 3 (three) times daily as needed (pain).    Marland Kitchen acetaZOLAMIDE (DIAMOX) 125 MG tablet Take 1 tablet (125 mg total) by mouth 2 (two) times daily. 60 tablet 0  . folic acid (FOLVITE) 427 MCG tablet Take 800 mcg by mouth every morning.    Marland Kitchen ibuprofen (ADVIL,MOTRIN) 600 MG tablet Take 1 tablet (600 mg total) by mouth every 8 (eight) hours as needed. 30 tablet 0  . methotrexate (RHEUMATREX) 2.5 MG tablet Take 2 tablets (5 mg total) by mouth once a week. Caution:Chemotherapy. Protect from light. 8 tablet 5  . methylPREDNISolone (MEDROL) 4 MG tablet TAKE 1 TABLET BY MOUTH EVERY DAY, CONTINUE UNTIL STOPPED BY MD 30 tablet 3  . metoprolol succinate (TOPROL-XL) 25 MG 24 hr tablet Take 1 tablet (25 mg total) by mouth daily. 30 tablet 6  . QVAR 80 MCG/ACT inhaler INHALE 2 PUFFS TWICE DAILY 8.7 g 2  . sulfamethoxazole-trimethoprim (BACTRIM DS,SEPTRA DS) 800-160 MG tablet Take 1 tablet by mouth 3 (three) times a week. 12 tablet 5   No current facility-administered medications for this visit.     No Known Allergies  Review of Systems negative except from HPI and PMH  Physical Exam BP (!) 126/92   Pulse 93  Ht 5\' 5"  (1.651 m)   Wt 224 lb (101.6 kg)   BMI 37.28 kg/m  Well developed and nourished in no acute distress HENT normal Neck supple with JVP-flat Clear Device pocket well healed; without hematoma or erythema.  There is no tethering  Regular rate and rhythm, no murmurs or gallops Abd-soft with active BS No Clubbing cyanosis edema Skin-warm and dry A & Oriented  Grossly normal sensory and motor function   ECG 93 Intervals 17/07/37  Assessment and  Plan   Ventricular tachycardia   Implantable defibrillator-Medtronic  Cardiomyopathy-sarcoid  Hypertension   Dypsnea on exertion   Morbidly obese    No intercurrent sustained Ventricular tachycardia    Device is approaching ERI  Pt alerts demonstrated   Blood pressure remains elevated.  Hopefully she will be able to lose some weight that will have an impact on her blood pressure without the need for more medications.  Encourage weight loss

## 2017-12-03 LAB — CUP PACEART REMOTE DEVICE CHECK
Battery Voltage: 2.63 V
Brady Statistic AP VP Percent: 0 %
Brady Statistic AS VS Percent: 99.95 %
Brady Statistic RA Percent Paced: 0.01 %
Brady Statistic RV Percent Paced: 0.04 %
Date Time Interrogation Session: 20181129051608
HIGH POWER IMPEDANCE MEASURED VALUE: 47 Ohm
HIGH POWER IMPEDANCE MEASURED VALUE: 62 Ohm
HighPow Impedance: 323 Ohm
Implantable Lead Implant Date: 20120308
Implantable Lead Location: 753859
Implantable Lead Model: 5076
Implantable Pulse Generator Implant Date: 20120308
Lead Channel Impedance Value: 456 Ohm
Lead Channel Pacing Threshold Pulse Width: 0.4 ms
Lead Channel Sensing Intrinsic Amplitude: 5.25 mV
Lead Channel Setting Pacing Amplitude: 2 V
Lead Channel Setting Pacing Amplitude: 2.5 V
Lead Channel Setting Pacing Pulse Width: 0.4 ms
MDC IDC LEAD IMPLANT DT: 20120308
MDC IDC LEAD LOCATION: 753860
MDC IDC MSMT LEADCHNL RA PACING THRESHOLD AMPLITUDE: 0.5 V
MDC IDC MSMT LEADCHNL RA PACING THRESHOLD PULSEWIDTH: 0.4 ms
MDC IDC MSMT LEADCHNL RA SENSING INTR AMPL: 2.25 mV
MDC IDC MSMT LEADCHNL RA SENSING INTR AMPL: 2.25 mV
MDC IDC MSMT LEADCHNL RV IMPEDANCE VALUE: 399 Ohm
MDC IDC MSMT LEADCHNL RV PACING THRESHOLD AMPLITUDE: 0.75 V
MDC IDC MSMT LEADCHNL RV SENSING INTR AMPL: 5.25 mV
MDC IDC SET LEADCHNL RV SENSING SENSITIVITY: 0.45 mV
MDC IDC STAT BRADY AP VS PERCENT: 0.01 %
MDC IDC STAT BRADY AS VP PERCENT: 0.04 %

## 2018-01-11 ENCOUNTER — Ambulatory Visit: Payer: BC Managed Care – PPO | Admitting: Physician Assistant

## 2018-01-11 ENCOUNTER — Encounter: Payer: Self-pay | Admitting: Physician Assistant

## 2018-01-11 ENCOUNTER — Other Ambulatory Visit: Payer: Self-pay

## 2018-01-11 VITALS — BP 124/88 | HR 99 | Temp 97.8°F | Resp 16 | Ht 66.0 in | Wt 230.0 lb

## 2018-01-11 DIAGNOSIS — J029 Acute pharyngitis, unspecified: Secondary | ICD-10-CM | POA: Diagnosis not present

## 2018-01-11 DIAGNOSIS — H6123 Impacted cerumen, bilateral: Secondary | ICD-10-CM | POA: Diagnosis not present

## 2018-01-11 LAB — CUP PACEART INCLINIC DEVICE CHECK
Brady Statistic AP VS Percent: 0.01 %
Brady Statistic RA Percent Paced: 0.01 %
Brady Statistic RV Percent Paced: 0.04 %
HIGH POWER IMPEDANCE MEASURED VALUE: 266 Ohm
HighPow Impedance: 40 Ohm
HighPow Impedance: 48 Ohm
Implantable Lead Implant Date: 20120308
Implantable Lead Implant Date: 20120308
Implantable Lead Location: 753859
Implantable Lead Model: 6947
Lead Channel Impedance Value: 380 Ohm
Lead Channel Impedance Value: 437 Ohm
Lead Channel Pacing Threshold Pulse Width: 0.4 ms
Lead Channel Pacing Threshold Pulse Width: 0.4 ms
Lead Channel Sensing Intrinsic Amplitude: 2.875 mV
Lead Channel Setting Pacing Amplitude: 2.5 V
MDC IDC LEAD LOCATION: 753860
MDC IDC MSMT BATTERY VOLTAGE: 2.63 V
MDC IDC MSMT LEADCHNL RA PACING THRESHOLD AMPLITUDE: 0.5 V
MDC IDC MSMT LEADCHNL RV PACING THRESHOLD AMPLITUDE: 0.75 V
MDC IDC MSMT LEADCHNL RV SENSING INTR AMPL: 5.75 mV
MDC IDC PG IMPLANT DT: 20120308
MDC IDC SESS DTM: 20181203170608
MDC IDC SET LEADCHNL RA PACING AMPLITUDE: 2 V
MDC IDC SET LEADCHNL RV PACING PULSEWIDTH: 0.4 ms
MDC IDC SET LEADCHNL RV SENSING SENSITIVITY: 0.45 mV
MDC IDC STAT BRADY AP VP PERCENT: 0 %
MDC IDC STAT BRADY AS VP PERCENT: 0.04 %
MDC IDC STAT BRADY AS VS PERCENT: 99.95 %

## 2018-01-11 LAB — POCT RAPID STREP A (OFFICE): Rapid Strep A Screen: NEGATIVE

## 2018-01-11 NOTE — Patient Instructions (Addendum)
You are negative for strep. I am sending a culture for review. If it grows bacteria, we will contact you and I will send an antibiotic to your pharmacy.   Cepacol throat lozenge. You can find this over the counter.    Stay well hydrated. Get lost of rest.  Advil or ibuprofen for pain. Do not take Aspirin.  Drink enough water and fluids to keep your urine clear or pale yellow. For sore throat: ? Gargle with 8 oz of salt water ( tsp of salt per 1 qt of water) as often as every 1-2 hours to soothe your throat.  Gargle liquid benadryl.  Use Elderberry syrup.   For sore throat try using a honey-based tea. Use 3 teaspoons of honey with juice squeezed from half lemon. Place shaved pieces of ginger into 1/2-1 cup of water and warm over stove top. Then mix the ingredients and repeat every 4 hours as needed.  Cough Syrup Recipe: Sweet Lemon & Honey Thyme  Ingredients a handful of fresh thyme sprigs   1 pint of water (2 cups)  1/2 cup honey (raw is best, but regular will do)  1/2 lemon chopped Instructions 1. Place the lemon in the pint jar and cover with the honey. The honey will macerate the lemons and draw out liquids which taste so delicious! 2. Meanwhile, toss the thyme leaves into a saucepan and cover them with the water. 3. Bring the water to a gentle simmer and reduce it to half, about a cup of tea. 4. When the tea is reduced and cooled a bit, strain the sprigs & leaves, add it into the pint jar and stir it well. 5. Give it a shake and use a spoonful as needed. 6. Store your homemade cough syrup in the refrigerator for about a month.   Thank you for coming in today. I hope you feel we met your needs.  Feel free to call PCP if you have any questions or further requests.  Please consider signing up for MyChart if you do not already have it, as this is a great way to communicate with me.  Best,  Whitney McVey, PA-C   IF you received an x-ray today, you will receive an invoice from  [facility]. Please contact [facility] at [phone] with questions or concerns regarding your invoice.   IF you received labwork today, you will receive an invoice from [facility]. Please contact [facility] at [phone] with questions or concerns regarding your invoice.   Our billing staff will not be able to assist you with questions regarding bills from these companies.  You will be contacted with the lab results as soon as they are available. The fastest way to get your results is to activate your My Chart account. Instructions are located on the last page of this paperwork. If you have not heard from [place] regarding the results in 2 weeks, please contact this office.

## 2018-01-11 NOTE — Progress Notes (Signed)
Alexandra Hood  MRN: [id number] DOB: 1971-12-01  PCP: Shawnee Knapp, MD  Subjective:  Pt is a 48 y.o. Female PMH pseudo tumor cerebri, cardiomyopathy, secondary-sarcoid, pulmonary sarcoidosis, lupus, depression, prediabetes, obesity who presents to clinic for sore throat x 1 week. She endorses rash on her chest 1 week ago. Endorses chills a few days ago. Last night her symptoms worsened.  10/10 pain last night. 6/10 pain today.  She is staying well hydrated and is able to eat solid foods. Denies difficulty breathing, n/v, cough, nasal drainage, body aches.  She works at a school.   Review of Systems  Constitutional: Negative for chills, diaphoresis, fatigue and fever.  HENT: Positive for sore throat. Negative for congestion, postnasal drip, rhinorrhea, sinus pressure and sinus pain.   Respiratory: Negative for cough, shortness of breath and wheezing.   Psychiatric/Behavioral: Negative for sleep disturbance.    Patient Active Problem List   Diagnosis Date Noted  . Ventricular tachycardia ([place])   . Pseudotumor cerebri   . Pertussis   . Multiple pulmonary nodules 08/24/2014  . Pulmonary sarcoidosis ([place]) 08/24/2014  . Sinus tachycardia 10/23/2013  . T wave over sensing 10/23/2013  . 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 Thrombocytosis ([place]) 03/06/2012  . Obesity 03/06/2012  . Pre-diabetes 03/06/2012  . 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 Keloid--ICD incision 04/25/2011  . 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 DEPRESSION 02/15/2011  . Unspecified vitamin D deficiency 01/20/2011  . VENTRICULAR TACHYCARDIA 01/20/2011  . Sarcoidosis 05/12/2009  . PSEUDOTUMOR CEREBRI 05/12/2009    Current Outpatient Medications on File Prior to Visit  Medication Sig Dispense Refill  . acetaminophen (TYLENOL) 500 MG tablet Take 1,000 mg by mouth 3 (three) times daily as needed (pain).    Marland Kitchen acetaZOLAMIDE (DIAMOX) 125 MG tablet Take 1 tablet (125 mg total) by  mouth 2 (two) times daily. 60 tablet 0  . folic acid (FOLVITE) 272 MCG tablet Take 800 mcg by mouth every morning.    Marland Kitchen ibuprofen (ADVIL,MOTRIN) 600 MG tablet Take 1 tablet (600 mg total) by mouth every 8 (eight) hours as needed. 30 tablet 0  . methotrexate (RHEUMATREX) 2.5 MG tablet Take 2 tablets (5 mg total) by mouth once a week. Caution:Chemotherapy. Protect from light. 8 tablet 5  . methylPREDNISolone (MEDROL) 4 MG tablet TAKE 1 TABLET BY MOUTH EVERY DAY, CONTINUE UNTIL STOPPED BY MD 30 tablet 3  . metoprolol succinate (TOPROL-XL) 25 MG 24 hr tablet Take 1 tablet (25 mg total) by mouth daily. 30 tablet 6  . QVAR 80 MCG/ACT inhaler INHALE 2 PUFFS TWICE DAILY 8.7 g 2   No current facility-administered medications on file prior to visit.     No Known Allergies   Objective:  BP 124/88   Pulse 99   Temp 97.8 F (36.6 C) (Oral)   Resp 16   Ht 5\' 6"  (1.676 m)   Wt 230 lb (104.3 kg)   SpO2 97%   BMI 37.12 kg/m   Physical Exam  Constitutional: She is oriented to person, place, and time and well-developed, well-nourished, and in no distress. No distress.  HENT:  Right Ear: Tympanic membrane normal.  Left Ear: Tympanic membrane normal.  Nose: Mucosal edema present. No rhinorrhea. Right sinus exhibits no maxillary sinus tenderness and no frontal sinus tenderness. Left sinus exhibits no maxillary sinus tenderness and no frontal sinus tenderness.  Mouth/Throat: Uvula is midline, oropharynx is clear and moist and mucous membranes are normal.  Hoarse voice  Neck: Neck supple.  Cardiovascular: Normal rate, regular rhythm and normal heart sounds.  Pulmonary/Chest: Effort normal and breath sounds normal. No respiratory distress. She has no wheezes. She has no rales.  Neurological: She is alert and oriented to person, place, and time. GCS score is 15.  Skin: Skin is warm and dry.  Psychiatric: Mood, memory, affect and judgment normal.  Vitals reviewed.   Results for orders placed or  performed in visit on 01/11/18  POCT rapid strep A  Result Value Ref Range   Rapid Strep A Screen Negative Negative    Assessment and Plan :  1. Sore throat - POCT rapid strep A - Culture, Group A Strep - Pt c/o sore throat x 1 week. Rapid strep is negative. Vitals are stable. Plan to treat supportively. Strep culture is pending. Will contact and treat if positive.  2. Bilateral impacted cerumen - Ear wax removal    Mercer Pod, PA-C  [facility] [place] 01/11/2018 1:37 PM

## 2018-01-13 LAB — CULTURE, GROUP A STREP: Strep A Culture: NEGATIVE

## 2018-01-15 ENCOUNTER — Ambulatory Visit (INDEPENDENT_AMBULATORY_CARE_PROVIDER_SITE_OTHER): Payer: Self-pay | Admitting: *Deleted

## 2018-01-15 ENCOUNTER — Telehealth: Payer: Self-pay | Admitting: Cardiology

## 2018-01-15 DIAGNOSIS — Z95810 Presence of automatic (implantable) cardiac defibrillator: Secondary | ICD-10-CM

## 2018-01-15 NOTE — Telephone Encounter (Signed)
Spoke with pt and reminded pt of remote transmission that is due today. Pt verbalized understanding.   

## 2018-01-16 ENCOUNTER — Encounter: Payer: Self-pay | Admitting: Cardiology

## 2018-01-16 NOTE — Progress Notes (Signed)
Remote ICD transmission.   

## 2018-01-22 LAB — CUP PACEART REMOTE DEVICE CHECK
Brady Statistic AP VP Percent: 0 %
Brady Statistic AP VS Percent: 0.02 %
Brady Statistic AS VS Percent: 99.94 %
HighPow Impedance: 304 Ohm
HighPow Impedance: 43 Ohm
HighPow Impedance: 56 Ohm
Implantable Lead Implant Date: 20120308
Implantable Lead Location: 753859
Implantable Lead Model: 6947
Implantable Pulse Generator Implant Date: 20120308
Lead Channel Impedance Value: 456 Ohm
Lead Channel Pacing Threshold Amplitude: 0.5 V
Lead Channel Pacing Threshold Pulse Width: 0.4 ms
Lead Channel Sensing Intrinsic Amplitude: 2.5 mV
Lead Channel Sensing Intrinsic Amplitude: 2.5 mV
Lead Channel Sensing Intrinsic Amplitude: 5.625 mV
Lead Channel Setting Pacing Amplitude: 2.5 V
MDC IDC LEAD IMPLANT DT: 20120308
MDC IDC LEAD LOCATION: 753860
MDC IDC MSMT BATTERY VOLTAGE: 2.63 V
MDC IDC MSMT LEADCHNL RA PACING THRESHOLD PULSEWIDTH: 0.4 ms
MDC IDC MSMT LEADCHNL RV IMPEDANCE VALUE: 399 Ohm
MDC IDC MSMT LEADCHNL RV PACING THRESHOLD AMPLITUDE: 0.75 V
MDC IDC MSMT LEADCHNL RV SENSING INTR AMPL: 5.625 mV
MDC IDC SESS DTM: 20190123021237
MDC IDC SET LEADCHNL RA PACING AMPLITUDE: 2 V
MDC IDC SET LEADCHNL RV PACING PULSEWIDTH: 0.4 ms
MDC IDC SET LEADCHNL RV SENSING SENSITIVITY: 0.45 mV
MDC IDC STAT BRADY AS VP PERCENT: 0.04 %
MDC IDC STAT BRADY RA PERCENT PACED: 0.02 %
MDC IDC STAT BRADY RV PERCENT PACED: 0.04 %

## 2018-02-04 ENCOUNTER — Ambulatory Visit (INDEPENDENT_AMBULATORY_CARE_PROVIDER_SITE_OTHER): Payer: BC Managed Care – PPO | Admitting: Neurology

## 2018-02-04 ENCOUNTER — Encounter: Payer: Self-pay | Admitting: Neurology

## 2018-02-04 VITALS — BP 124/90 | HR 123 | Ht 65.0 in | Wt 228.0 lb

## 2018-02-04 DIAGNOSIS — G43419 Hemiplegic migraine, intractable, without status migrainosus: Secondary | ICD-10-CM | POA: Diagnosis not present

## 2018-02-04 DIAGNOSIS — G43119 Migraine with aura, intractable, without status migrainosus: Secondary | ICD-10-CM

## 2018-02-04 MED ORDER — KETOROLAC TROMETHAMINE 15.75 MG/SPRAY NA SOLN: 1.0000 | NASAL | 12 refills | Status: DC

## 2018-02-04 MED ORDER — TOPIRAMATE 50 MG PO TABS
50.0000 mg | ORAL_TABLET | Freq: Every day | ORAL | 2 refills | Status: DC
Start: 2018-02-04 — End: 2018-05-15

## 2018-02-04 NOTE — Patient Instructions (Signed)
Migraine Recommendations: 1.  Start topiramate 50mg  at bedtime.  Call in 4 weeks with update and we can adjust dose if needed. 2.  Take Sprix (ketorolac) nasal spray at earliest onset of headache.  Spray once in each nostril.  May repeat every 6 to 8 hours up to 4 times daily. 3.  Limit use of pain relievers to no more than 2 days out of the week.  These medications include acetaminophen, ibuprofen, triptans and narcotics.  This will help reduce risk of rebound headaches. 4.  Be aware of common food triggers such as processed sweets, processed foods with nitrites (such as deli meat, hot dogs, sausages), foods with MSG, alcohol (such as wine), chocolate, certain cheeses, certain fruits (dried fruits, bananas, some citrus fruit), vinegar, diet soda. 4.  Avoid caffeine 5.  Routine exercise 6.  Proper sleep hygiene 7.  Stay adequately hydrated with water 8.  Keep a headache diary. 9.  Maintain proper stress management. 10.  Do not skip meals. 11.  Consider supplements:  Magnesium citrate 400mg  to 600mg  daily, riboflavin 400mg , Coenzyme Q 10 100mg  three times daily 12.  Follow up in 3 months.

## 2018-02-04 NOTE — Progress Notes (Signed)
NEUROLOGY CONSULTATION NOTE  Alexandra Hood MRN: [id number] DOB: August 10, 1971  Referring provider: Philis Fendt, PA-C Primary care provider: Delman Cheadle, MD  Reason for consult:  headaches  HISTORY OF PRESENT ILLNESS: Alexandra Hood is a 47 year old female with sarcoidosis and history of idiopathic intracranial hypertension who presents for headache.  History supplemented by PCP and prior neurologist's notes.  She was diagnosed with pseudotumor cerebri in January 2009.  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  She underwent lumbar puncture on 01/24/08 which demonstrated normal opening pressure of 14 cm H2O with CSF glucose 68, protein 23 and 0 cell count.  She apparently had an MRI and MRV of the head performed, which were reportedly normal. She was on acetazolamide for awhile, which was subsequently discontinued due to resolution of papilledema.  She sees the ophthalmologist annually.  Those headaches were dull right sided posterior headaches. She recently restarted acetazolamide to treat current migraines, which are different.  She began experiencing migraines around age 13.  They are right frontal/temporal, non-throbbing and severe.  They are associated with nausea, photophobia, phonophobia, osmophobia, but not vomiting.  She sometimes sees white spots in her vision and has associated right upper and lower extremity numbness and weakness.  They typically last 2 days and occur 2 to 3 times a month.  Nothing specifically triggers or aggravates them.  They are relieved by rest..  Current NSAIDS:  Advil Migraine Current analgesics:  Excedrin Migraine Current triptans:  no Current anti-emetic:  no Current muscle relaxants:  no Current anti-anxiolytic:  no Current sleep aide:  no Current Antihypertensive medications:  Toprol XL 25mg  Current Antidepressant medications:  no Current Anticonvulsant medications:  acetazolamide 125mg  1/2  tablet at bedtime Current Vitamins/Herbal/Supplements:  no Current Antihistamines/Decongestants:  no Other therapy:  no  Past NSAIDS:  naproxen 550mg  (drowsiness) Past analgesics:  Tylenol Past abortive triptans:  no Past muscle relaxants:  no Past anti-emetic:  Zofran ODT 4mg  Past antihypertensive medications:  no Past antidepressant medications:  no Past anticonvulsant medications:  topiramate 25mg  twice daily Past vitamins/Herbal/Supplements:  no Past antihistamines/decongestants:  no Other past therapies:  no  Caffeine:  no Alcohol:  no Smoker:  no Diet:  hydrates Exercise:  walks Depression:  no; Anxiety:  no Other pain:  Pain related to sarcoidosis Sleep hygiene:  Sometimes wakes up once during night. Family history of headache:  mother  10/10/17 BMP: Na 141, K 4.1, Cl 101, CO2 26, glucose 94, BUN 10, Cr 0.68.  PAST MEDICAL HISTORY: Past Medical History:  Diagnosis Date  . Headache   . Lupus pernio    -skin bx confirmed at [facility] 05/07/2009 (personally reviewed) -looks improved 07/09/2009 -  . Pertussis   . Pseudotumor cerebri    .Marland KitchenMarland KitchenDr Sabra Heck and Dr. Katy Fitch - dxed Jan 2009, on diamox  - LP 01/24/2008 at [facility] - Normal ( pressure 14cm, gluc 68, Protein 23, Clear, colorless, RBC 3, WBC - too few to count)  - CT head non contrast done at [facility] 11/25/2007: "normal". Ddoes not report MRI, MRA, MRV being done - 2nd opinon by Dr. Leonie Man - reportdly had MRI/MRV both normal; followup pending  . Pulmonary sarcoidosis ([place])    - Spiro 07/25/2010 on pred 10/mtx 10 -> FEv1 1.84L/775%, FVC 2.25L - SPiro 09/06/2010 -> Fev1 2.14L/81%. Cut pred to 5mg  per day. Cont mtx - SPiro 01/20/2011 - Fev1 1.6L/61%, Ratio 74%. CXR Worse -> increased pred to 10mg  perday. Cont Mtx.    Marland Kitchen  Ventricular tachycardia ([place])   . Vitamin D deficiency    - Aug 2011: 22 and low -> commence Rx. - Jan 2012: 11    PAST SURGICAL HISTORY: Past Surgical History:  Procedure Laterality Date  .  CARDIAC DEFIBRILLATOR PLACEMENT  march 2012   [employer]  . ESOPHAGOGASTRODUODENOSCOPY (EGD) WITH PROPOFOL N/A 02/25/2016   Procedure: ESOPHAGOGASTRODUODENOSCOPY (EGD) WITH PROPOFOL;  Surgeon: Laurence Spates, MD;  Location: [facility] END[facility]COPY;  Service: Endoscopy;  Laterality: N/A;  . 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 acetaminophen (TYLENOL) 500 MG tablet Take 1,000 mg by mouth 3 (three) times daily as needed (pain).    . folic acid (FOLVITE) 563 MCG tablet Take 800 mcg by mouth every morning.    Marland Kitchen ibuprofen (ADVIL,MOTRIN) 600 MG tablet Take 1 tablet (600 mg total) by mouth every 8 (eight) hours as needed. 30 tablet 0  . methotrexate (RHEUMATREX) 2.5 MG tablet Take 2 tablets (5 mg total) by mouth once a week. Caution:Chemotherapy. Protect from light. (Patient not taking: Reported on 02/04/2018) 8 tablet 5  . methylPREDNISolone (MEDROL) 4 MG tablet TAKE 1 TABLET BY MOUTH EVERY DAY, CONTINUE UNTIL STOPPED BY MD (Patient not taking: Reported on 02/04/2018) 30 tablet 3  . metoprolol succinate (TOPROL-XL) 25 MG 24 hr tablet Take 1 tablet (25 mg total) by mouth daily. (Patient not taking: Reported on 02/04/2018) 30 tablet 6  . QVAR 80 MCG/ACT inhaler INHALE 2 PUFFS TWICE DAILY (Patient not taking: Reported on 02/04/2018) 8.7 g 2   No current facility-administered medications on file prior to visit.     ALLERGIES: No Known Allergies  FAMILY HISTORY: Family History  Problem Relation Age of Onset  . Allergies Mother   . Diabetes Mother   . Hypertension Mother   . Hyperlipidemia Mother   . Migraines Mother   . Hyperlipidemia Father   . Hypertension Father   . Diabetes Father   . Hyperlipidemia Sister   . Hypertension Sister   . Meniere's disease Sister   . COPD Maternal Grandfather   . Leukemia Paternal Grandmother   . Heart attack Paternal Grandfather     SOCIAL HISTORY: Social History    Socioeconomic History  . Marital status: Legally Separated    Spouse name: Not on file  . Number of children: 1  . Years of education: Not on file  . Highest education level: Master's degree (e.g., MA, MS, MEng, MEd, MSW, MBA)  Social Needs  . Financial resource strain: Not on file  . Food insecurity - worry: Not on file  . Food insecurity - inability: Not on file  . Transportation needs - medical: Not on file  . Transportation needs - non-medical: Not on file  Occupational History  . Occupation: school Information systems manager)  Tobacco Use  . Smoking status: Former Smoker    Packs/day: 1.00    Years: 15.00    Pack years: 15.00    Types: Cigarettes    Last attempt to quit: 03/03/2007    Years since quitting: 10.9  . Smokeless tobacco: Never Used  Substance and Sexual Activity  . Alcohol use: No    Alcohol/week: 0.0 oz  . Drug use: No  . Sexual activity: Not Currently    Comment: bilateral oophorectomy  Other Topics Concern  . 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 Wants letter       Patient is right-handed. She is separated, has one daughter. Lives in 2 story house, master on 1st. Drinks an occasional cup of coffee, 2 glasses of tea a day. Walks her dog daily.    REVIEW OF SYSTEMS: Constitutional: No fevers, chills, or sweats, no generalized fatigue, change in appetite Eyes: No visual changes, double vision, eye pain Ear, nose and throat: No hearing loss, ear pain, nasal congestion, sore throat Cardiovascular: No chest pain, palpitations Respiratory:  No shortness of breath at rest or with exertion, wheezes GastrointestinaI: No nausea, vomiting, diarrhea, abdominal pain, fecal incontinence Genitourinary:  No dysuria, urinary retention or frequency Musculoskeletal:  No neck pain, back pain Integumentary: No rash, pruritus, skin lesions Neurological: as above Psychiatric: No  depression, insomnia, anxiety Endocrine: No palpitations, fatigue, diaphoresis, mood swings, change in appetite, change in weight, increased thirst Hematologic/Lymphatic:  No purpura, petechiae. Allergic/Immunologic: no itchy/runny eyes, nasal congestion, recent allergic reactions, rashes  PHYSICAL EXAM: Vitals:   02/04/18 1505  BP: 124/90  Pulse: (!) 123  SpO2: 95%   General: No acute distress.  Patient appears well-groomed.  Head:  Normocephalic/atraumatic Eyes:  fundi examined but not visualized Neck: supple, no paraspinal tenderness, full range of motion Back: No paraspinal tenderness Heart: regular rate and rhythm Lungs: Clear to auscultation bilaterally. Vascular: No carotid bruits. Neurological Exam: Mental status: alert and oriented to person, place, and time, recent and remote memory intact, fund of knowledge intact, attention and concentration intact, speech fluent and not dysarthric, language intact. Cranial nerves: CN I: not tested CN II: pupils equal, round and reactive to light, visual fields intact CN III, IV, VI:  full range of motion, no nystagmus, no ptosis CN V: facial sensation intact CN VII: upper and lower face symmetric CN VIII: hearing intact CN IX, X: gag intact, uvula midline CN XI: sternocleidomastoid and trapezius muscles intact CN XII: tongue midline Bulk & Tone: normal, no fasciculations. Motor:  5/5 throughout  Sensation: temperature and vibration sensation intact. Deep Tendon Reflexes:  2+ throughout, toes downgoing.  Finger to nose testing:  Without dysmetria.  Heel to shin:  Without dysmetria.  Gait:  Normal station and stride.  Able to turn. Romberg negative.  IMPRESSION: Hemiplegic migraine  PLAN: 1.  Stop acetazolamide and start topiramate 50mg  at bedtime.   2.  Due to stroke like symptoms, triptans are contraindicated.  Take Sprix (ketorolac) nasal spray at earliest onset of headache.  Spray once in each nostril.  May repeat every 6  to 8 hours up to 4 times daily. 3.  Limit use of pain relievers to no more than 2 days out of the week.  These medications include acetaminophen, ibuprofen, triptans and narcotics.  This will help reduce risk of rebound headaches. 4.  Be aware of common food triggers such as processed sweets, processed foods with nitrites (such as deli meat, hot dogs, sausages), foods with MSG, alcohol (such as wine), chocolate, certain cheeses, certain fruits (dried fruits, bananas, some citrus fruit), vinegar, diet soda. 4.  Avoid caffeine 5.  Routine exercise 6.  Proper sleep hygiene 7.  Stay adequately hydrated with water 8.  Keep a headache diary. 9.  Maintain proper stress management. 10.  Do not skip meals. 11.  Consider supplements:  Magnesium citrate 400mg  to 600mg  daily, riboflavin 400mg , Coenzyme Q 10 100mg  three times daily 12.  Follow up in 3 months. Thank you for allowing me to take part in  the care of this patient.  Metta Clines, DO  CC:  Philis Fendt, PA-C  Delman Cheadle, MD

## 2018-02-18 ENCOUNTER — Ambulatory Visit (INDEPENDENT_AMBULATORY_CARE_PROVIDER_SITE_OTHER): Payer: Self-pay | Admitting: *Deleted

## 2018-02-18 DIAGNOSIS — I429 Cardiomyopathy, unspecified: Secondary | ICD-10-CM

## 2018-02-18 LAB — CUP PACEART REMOTE DEVICE CHECK
Battery Voltage: 2.62 V
Brady Statistic AP VP Percent: 0 %
Brady Statistic AP VS Percent: 0.01 %
Brady Statistic AS VP Percent: 0.05 %
Brady Statistic AS VS Percent: 99.94 %
Brady Statistic RA Percent Paced: 0.01 %
Brady Statistic RV Percent Paced: 0.05 %
Date Time Interrogation Session: 20190225083428
HighPow Impedance: 266 Ohm
HighPow Impedance: 43 Ohm
HighPow Impedance: 52 Ohm
Implantable Lead Implant Date: 20120308
Implantable Lead Implant Date: 20120308
Implantable Lead Location: 753859
Implantable Lead Location: 753860
Implantable Lead Model: 5076
Implantable Lead Model: 6947
Implantable Pulse Generator Implant Date: 20120308
Lead Channel Impedance Value: 380 Ohm
Lead Channel Impedance Value: 456 Ohm
Lead Channel Pacing Threshold Amplitude: 0.5 V
Lead Channel Pacing Threshold Amplitude: 0.75 V
Lead Channel Pacing Threshold Pulse Width: 0.4 ms
Lead Channel Pacing Threshold Pulse Width: 0.4 ms
Lead Channel Sensing Intrinsic Amplitude: 2.625 mV
Lead Channel Sensing Intrinsic Amplitude: 2.625 mV
Lead Channel Sensing Intrinsic Amplitude: 4.75 mV
Lead Channel Sensing Intrinsic Amplitude: 4.75 mV
Lead Channel Setting Pacing Amplitude: 2 V
Lead Channel Setting Pacing Amplitude: 2.5 V
Lead Channel Setting Pacing Pulse Width: 0.4 ms
Lead Channel Setting Sensing Sensitivity: 0.45 mV

## 2018-02-18 NOTE — Addendum Note (Signed)
Addended by: Tiajuana Amass on: 02/18/2018 10:20 AM   Modules accepted: Level of Service

## 2018-02-18 NOTE — Progress Notes (Signed)
Remote ICD transmission.   

## 2018-02-21 ENCOUNTER — Encounter: Payer: Self-pay | Admitting: Cardiology

## 2018-03-21 ENCOUNTER — Ambulatory Visit (INDEPENDENT_AMBULATORY_CARE_PROVIDER_SITE_OTHER): Payer: BC Managed Care – PPO | Admitting: *Deleted

## 2018-03-21 DIAGNOSIS — I472 Ventricular tachycardia, unspecified: Secondary | ICD-10-CM

## 2018-03-22 NOTE — Progress Notes (Signed)
Remote ICD transmission.   

## 2018-03-25 ENCOUNTER — Encounter: Payer: Self-pay | Admitting: Cardiology

## 2018-04-02 LAB — CUP PACEART REMOTE DEVICE CHECK
Battery Voltage: 2.63 V
Brady Statistic AS VP Percent: 0.04 %
Brady Statistic RA Percent Paced: 0.01 %
Date Time Interrogation Session: 20190328083628
HIGH POWER IMPEDANCE MEASURED VALUE: 53 Ohm
HighPow Impedance: 266 Ohm
HighPow Impedance: 42 Ohm
Implantable Lead Implant Date: 20120308
Implantable Lead Location: 753860
Implantable Pulse Generator Implant Date: 20120308
Lead Channel Impedance Value: 380 Ohm
Lead Channel Pacing Threshold Amplitude: 0.875 V
Lead Channel Pacing Threshold Pulse Width: 0.4 ms
Lead Channel Sensing Intrinsic Amplitude: 2.625 mV
Lead Channel Sensing Intrinsic Amplitude: 5.375 mV
Lead Channel Sensing Intrinsic Amplitude: 5.375 mV
Lead Channel Setting Pacing Amplitude: 2 V
Lead Channel Setting Pacing Pulse Width: 0.4 ms
Lead Channel Setting Sensing Sensitivity: 0.45 mV
MDC IDC LEAD IMPLANT DT: 20120308
MDC IDC LEAD LOCATION: 753859
MDC IDC MSMT LEADCHNL RA IMPEDANCE VALUE: 437 Ohm
MDC IDC MSMT LEADCHNL RA PACING THRESHOLD AMPLITUDE: 0.5 V
MDC IDC MSMT LEADCHNL RA SENSING INTR AMPL: 2.625 mV
MDC IDC MSMT LEADCHNL RV PACING THRESHOLD PULSEWIDTH: 0.4 ms
MDC IDC SET LEADCHNL RV PACING AMPLITUDE: 2.5 V
MDC IDC STAT BRADY AP VP PERCENT: 0 %
MDC IDC STAT BRADY AP VS PERCENT: 0 %
MDC IDC STAT BRADY AS VS PERCENT: 99.96 %
MDC IDC STAT BRADY RV PERCENT PACED: 0.04 %

## 2018-04-23 ENCOUNTER — Ambulatory Visit (INDEPENDENT_AMBULATORY_CARE_PROVIDER_SITE_OTHER): Payer: Self-pay | Admitting: *Deleted

## 2018-04-23 DIAGNOSIS — I472 Ventricular tachycardia, unspecified: Secondary | ICD-10-CM

## 2018-04-23 NOTE — Progress Notes (Signed)
Remote ICD transmission.   

## 2018-04-23 NOTE — Addendum Note (Signed)
Addended by: Tiajuana Amass on: 04/23/2018 11:20 AM   Modules accepted: Level of Service

## 2018-04-24 ENCOUNTER — Encounter: Payer: Self-pay | Admitting: Cardiology

## 2018-04-24 LAB — CUP PACEART REMOTE DEVICE CHECK
Battery Voltage: 2.62 V
Brady Statistic RA Percent Paced: 0.01 %
Brady Statistic RV Percent Paced: 0.04 %
HIGH POWER IMPEDANCE MEASURED VALUE: 304 Ohm
HIGH POWER IMPEDANCE MEASURED VALUE: 45 Ohm
HighPow Impedance: 58 Ohm
Implantable Lead Implant Date: 20120308
Implantable Lead Implant Date: 20120308
Implantable Lead Location: 753860
Implantable Lead Model: 5076
Implantable Lead Model: 6947
Implantable Pulse Generator Implant Date: 20120308
Lead Channel Impedance Value: 399 Ohm
Lead Channel Pacing Threshold Amplitude: 0.5 V
Lead Channel Pacing Threshold Pulse Width: 0.4 ms
Lead Channel Sensing Intrinsic Amplitude: 2 mV
Lead Channel Sensing Intrinsic Amplitude: 2 mV
Lead Channel Sensing Intrinsic Amplitude: 6.25 mV
Lead Channel Setting Pacing Pulse Width: 0.4 ms
Lead Channel Setting Sensing Sensitivity: 0.45 mV
MDC IDC LEAD LOCATION: 753859
MDC IDC MSMT LEADCHNL RA IMPEDANCE VALUE: 494 Ohm
MDC IDC MSMT LEADCHNL RV PACING THRESHOLD AMPLITUDE: 0.75 V
MDC IDC MSMT LEADCHNL RV PACING THRESHOLD PULSEWIDTH: 0.4 ms
MDC IDC MSMT LEADCHNL RV SENSING INTR AMPL: 6.25 mV
MDC IDC SESS DTM: 20190430052408
MDC IDC SET LEADCHNL RA PACING AMPLITUDE: 2 V
MDC IDC SET LEADCHNL RV PACING AMPLITUDE: 2.5 V
MDC IDC STAT BRADY AP VP PERCENT: 0 %
MDC IDC STAT BRADY AP VS PERCENT: 0.01 %
MDC IDC STAT BRADY AS VP PERCENT: 0.04 %
MDC IDC STAT BRADY AS VS PERCENT: 99.95 %

## 2018-05-15 ENCOUNTER — Other Ambulatory Visit: Payer: Self-pay

## 2018-05-15 ENCOUNTER — Ambulatory Visit: Payer: BC Managed Care – PPO | Admitting: Neurology

## 2018-05-15 ENCOUNTER — Encounter: Payer: Self-pay | Admitting: Neurology

## 2018-05-15 VITALS — BP 146/88 | HR 103 | Ht 65.0 in | Wt 226.0 lb

## 2018-05-15 DIAGNOSIS — G932 Benign intracranial hypertension: Secondary | ICD-10-CM | POA: Diagnosis not present

## 2018-05-15 DIAGNOSIS — G43409 Hemiplegic migraine, not intractable, without status migrainosus: Secondary | ICD-10-CM | POA: Diagnosis not present

## 2018-05-15 DIAGNOSIS — R5383 Other fatigue: Secondary | ICD-10-CM

## 2018-05-15 MED ORDER — TOPIRAMATE 100 MG PO TABS: 100.0000 mg | ORAL_TABLET | Freq: Every day | ORAL | 3 refills | Status: DC

## 2018-05-15 NOTE — Progress Notes (Signed)
NEUROLOGY FOLLOW UP OFFICE NOTE  Alexandra Hood [id number]  HISTORY OF PRESENT ILLNESS: Alexandra Hood is a 47 year old female with sarcoidosis, ventricular tachycardia and history of idiopathic intracranial hypertension who follows up for migraine and idiopathic intracranial hypertension.  UPDATE: Since last visit, headaches have gotten worse.  It may be attributed to increased stress related to work.  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 Intensity:  severe Duration:  24 hours Frequency:  15-20 days Current NSAIDS:  Sprix nasal spray (ineffective and caused burning) Current analgesics:  Excedrin Migraine (almost daily) Current triptans:  contraindicated (ventricular tachycardia and stroke-like symptoms) Current anti-emetic:  no Current muscle relaxants:  no Current anti-anxiolytic:  no Current sleep aide:  no Current Antihypertensive medications:  Toprol XL 25mg  Current Antidepressant medications:  no Current Anticonvulsant medications:  topiramate 50mg  at bedtime Current Vitamins/Herbal/Supplements:  no Current Antihistamines/Decongestants:  no Other therapy:  no  Caffeine:  no Alcohol:  no Smoker:  no Diet:  hydrates Exercise:  walks Depression:  no; Anxiety:  yes Other pain:  Pain related to sarcoidosis Sleep hygiene:  Sometimes wakes up once during night.   HISTORY: She was diagnosed with pseudotumor cerebri in January 2009.  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  She underwent lumbar puncture on 01/24/08 which demonstrated normal opening pressure of 14 cm H2O with CSF glucose 68, protein 23 and 0 cell count.  She apparently had an MRI and MRV of the head performed, which were reportedly normal. She was on acetazolamide for awhile, which was subsequently discontinued due to resolution of papilledema.  She sees the ophthalmologist annually.  Those headaches were dull right sided posterior headaches. She  recently restarted acetazolamide to treat current migraines, which are different.   She began experiencing migraines around age 54.  They are right frontal/temporal, non-throbbing and severe.  They are associated with nausea, photophobia, phonophobia, osmophobia, but not vomiting.  She sometimes sees white spots in her vision and has associated right upper and lower extremity numbness and weakness.  They typically last 2 days and occur 2 to 3 times a month.  Nothing specifically triggers or aggravates them.  They are relieved by rest..     Past NSAIDS:  naproxen 550mg  (drowsiness), Advil Migraine Past analgesics:  Tylenol Past abortive triptans:  no Past muscle relaxants:  no Past anti-emetic:  Zofran ODT 4mg  Past antihypertensive medications:  no Past antidepressant medications:  no Past anticonvulsant medications:  acetazolamide Past vitamins/Herbal/Supplements:  no Past antihistamines/decongestants:  no Other past therapies:  no   Family history of headache:  mother  PAST MEDICAL HISTORY: Past Medical History:  Diagnosis Date  . Headache   . Lupus pernio    -skin bx confirmed at [facility] 05/07/2009 (personally reviewed) -looks improved 07/09/2009 -  . Pertussis   . Pseudotumor cerebri    .Marland KitchenMarland KitchenDr Sabra Heck and Dr. Katy Fitch - dxed Jan 2009, on diamox  - LP 01/24/2008 at [facility] - Normal ( pressure 14cm, gluc 68, Protein 23, Clear, colorless, RBC 3, WBC - too few to count)  - CT head non contrast done at [facility] 11/25/2007: "normal". Ddoes not report MRI, MRA, MRV being done - 2nd opinon by Dr. Leonie Man - reportdly had MRI/MRV both normal; followup pending  . Pulmonary sarcoidosis ([place])    - Spiro 07/25/2010 on pred 10/mtx 10 -> FEv1 1.84L/775%, FVC 2.25L - SPiro 09/06/2010 -> Fev1 2.14L/81%. Cut pred to 5mg  per day. Cont  mtx - SPiro 01/20/2011 - Fev1 1.6L/61%, Ratio 74%. CXR Worse -> increased pred to 10mg  perday. Cont Mtx.    . Ventricular tachycardia ([place])   . Vitamin D deficiency     - Aug 2011: 22 and low -> commence Rx. - Jan 2012: 11    MEDICATIONS: Current Outpatient Medications on File Prior to Visit  Medication Sig Dispense Refill  . acetaminophen (TYLENOL) 500 MG tablet Take 1,000 mg by mouth 3 (three) times daily as needed (pain).    . folic acid (FOLVITE) 657 MCG tablet Take 800 mcg by mouth every morning.    Marland Kitchen ibuprofen (ADVIL,MOTRIN) 600 MG tablet Take 1 tablet (600 mg total) by mouth every 8 (eight) hours as needed. 30 tablet 0  . Ketorolac Tromethamine (SPRIX) 15.75 MG/SPRAY SOLN Place 1 spray into the nose as directed. 5 each 12  . methotrexate (RHEUMATREX) 2.5 MG tablet Take 2 tablets (5 mg total) by mouth once a week. Caution:Chemotherapy. Protect from light. (Patient not taking: Reported on 02/04/2018) 8 tablet 5  . methylPREDNISolone (MEDROL) 4 MG tablet TAKE 1 TABLET BY MOUTH EVERY DAY, CONTINUE UNTIL STOPPED BY MD (Patient not taking: Reported on 02/04/2018) 30 tablet 3  . metoprolol succinate (TOPROL-XL) 25 MG 24 hr tablet Take 1 tablet (25 mg total) by mouth daily. (Patient not taking: Reported on 02/04/2018) 30 tablet 6  . QVAR 80 MCG/ACT inhaler INHALE 2 PUFFS TWICE DAILY (Patient not taking: Reported on 02/04/2018) 8.7 g 2   No current facility-administered medications on file prior to visit.     ALLERGIES: No Known Allergies  FAMILY HISTORY: Family History  Problem Relation Age of Onset  . Allergies Mother   . Diabetes Mother   . Hypertension Mother   . Hyperlipidemia Mother   . Migraines Mother   . Hyperlipidemia Father   . Hypertension Father   . Diabetes Father   . Hyperlipidemia Sister   . Hypertension Sister   . Meniere's disease Sister   . COPD Maternal Grandfather   . Leukemia Paternal Grandmother   . Heart attack Paternal Grandfather     SOCIAL HISTORY: Social History   Socioeconomic History  . Marital status: Legally Separated    Spouse name: Not on file  . Number of children: 1  . Years of education: Not on file    . Highest education level: Master's degree (e.g., MA, MS, MEng, MEd, MSW, MBA)  Occupational History  . Occupation: school Information systems manager)  Social Needs  . Financial resource strain: Not on file  . Food insecurity:    Worry: Not on file    Inability: Not on file  . Transportation needs:    Medical: Not on file    Non-medical: Not on file  Tobacco Use  . Smoking status: Former Smoker    Packs/day: 1.00    Years: 15.00    Pack years: 15.00    Types: Cigarettes    Last attempt to quit: 03/03/2007    Years since quitting: 11.2  . Smokeless tobacco: Never Used  Substance and Sexual Activity  . Alcohol use: No    Alcohol/week: 0.0 oz  . Drug use: No  . Sexual activity: Not Currently    Comment: bilateral oophorectomy  Lifestyle  . Physical activity:    Days per week: Not on file    Minutes per session: Not on file  . Stress: Not on file  Relationships  . Social connections:    Talks on phone: Not on file  Gets together: Not on file    Attends religious service: Not on file    Active member of club or organization: Not on file    Attends meetings of clubs or organizations: Not on file    Relationship status: Not on file  . Intimate partner violence:    Fear of current or ex partner: Not on file    Emotionally abused: Not on file    Physically abused: Not on file    Forced sexual activity: Not on file  Other Topics Concern  . 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 She is separated, has one daughter. Lives in 2 story house, master on 1st. Drinks an occasional cup of coffee, 2 glasses of tea a day. Walks her dog daily.    REVIEW OF SYSTEMS: Constitutional: No fevers, chills, or sweats, no generalized fatigue, change in appetite Eyes: No visual changes, double vision, eye pain Ear, nose and throat: No hearing loss, ear pain, nasal  congestion, sore throat Cardiovascular: No chest pain, palpitations Respiratory:  No shortness of breath at rest or with exertion, wheezes GastrointestinaI: No nausea, vomiting, diarrhea, abdominal pain, fecal incontinence Genitourinary:  No dysuria, urinary retention or frequency Musculoskeletal:  No Integumentary: No rash, pruritus, skin lesions Neurological: as above Psychiatric: anxiety Endocrine: No palpitations, fatigue, diaphoresis, mood swings, change in appetite, change in weight, increased thirst Hematologic/Lymphatic:  No purpura, petechiae. Allergic/Immunologic: no itchy/runny eyes, nasal congestion, recent allergic reactions, rashes  PHYSICAL EXAM: Vitals:   05/15/18 1124  BP: (!) 146/88  Pulse: (!) 103  SpO2: 96%   General: No acute distress.  Patient appears well-groomed.   Head:  Normocephalic/atraumatic Eyes:  Fundi examined but not visualized Neck: supple, no paraspinal tenderness, full range of motion Heart:  Regular rate and rhythm Lungs:  Clear to auscultation bilaterally Back: No paraspinal tenderness Neurological Exam: alert and oriented to person, place, and time. Attention span and concentration intact, recent and remote memory intact, fund of knowledge intact.  Speech fluent and not dysarthric, language intact.  CN II-XII intact. Bulk and tone normal, muscle strength 5/5 throughout.  Sensation to light touch  intact.  Deep tendon reflexes 2+ throughout.  Finger to nose testing intact.  Gait normal, Romberg negative.  IMPRESSION: 1.  Hemiplegic migraine, not intractable 2.  History of idiopathic intracranial hypertension  PLAN: 1.  Increase topiramate to 100mg  at bedtime 2.  For abortive therapy, will try Cambia.  Limit use to no more than 2 days out of week to prevent rebound headache 3.  Stop Excedrin 4.  Keep headache diary 5.  Has follow up with ophthalmology within next month. 6.  Follow up in 3 months.   Metta Clines, DO  CC: Dr.  Brigitte Pulse

## 2018-05-15 NOTE — Patient Instructions (Signed)
1.  We will increase topiramate to 100mg  at bedtime.  Contact me in 4 weeks if you note no improvement 2.  At earliest onset of migraine, take Cambia.  Mix in water as directed.  Take on empty stomach.  No more than once in 24 hours.  Limit to no more than 2 days in a week 3.  Stop Excedrin 4.  Keep headache diary 5.  Follow up in 3 months.

## 2018-05-22 ENCOUNTER — Encounter: Payer: Self-pay | Admitting: Physician Assistant

## 2018-05-22 ENCOUNTER — Ambulatory Visit: Payer: BC Managed Care – PPO | Admitting: Physician Assistant

## 2018-05-22 ENCOUNTER — Ambulatory Visit (INDEPENDENT_AMBULATORY_CARE_PROVIDER_SITE_OTHER): Payer: BC Managed Care – PPO

## 2018-05-22 ENCOUNTER — Other Ambulatory Visit: Payer: Self-pay

## 2018-05-22 VITALS — BP 130/86 | HR 89 | Temp 98.2°F | Resp 18 | Ht 65.0 in | Wt 227.8 lb

## 2018-05-22 DIAGNOSIS — R319 Hematuria, unspecified: Secondary | ICD-10-CM | POA: Diagnosis not present

## 2018-05-22 DIAGNOSIS — R300 Dysuria: Secondary | ICD-10-CM

## 2018-05-22 LAB — POCT CBC
GRANULOCYTE PERCENT: 61.2 % (ref 37–80)
HEMATOCRIT: 38.9 % (ref 37.7–47.9)
Hemoglobin: 12.4 g/dL (ref 12.2–16.2)
LYMPH, POC: 3.7 — AB (ref 0.6–3.4)
MCH, POC: 27.3 pg (ref 27–31.2)
MCHC: 32 g/dL (ref 31.8–35.4)
MCV: 85.5 fL (ref 80–97)
MID (CBC): 0.6 (ref 0–0.9)
MPV: 7.8 fL (ref 0–99.8)
POC GRANULOCYTE: 6.7 (ref 2–6.9)
POC LYMPH %: 33.6 % (ref 10–50)
POC MID %: 5.2 %M (ref 0–12)
Platelet Count, POC: 416 10*3/uL (ref 142–424)
RBC: 4.55 M/uL (ref 4.04–5.48)
RDW, POC: 14.7 %
WBC: 10.9 10*3/uL — AB (ref 4.6–10.2)

## 2018-05-22 LAB — POCT URINALYSIS DIP (MANUAL ENTRY)
BILIRUBIN UA: NEGATIVE
BILIRUBIN UA: NEGATIVE mg/dL
Glucose, UA: NEGATIVE mg/dL
Leukocytes, UA: NEGATIVE
Nitrite, UA: NEGATIVE
PH UA: 6 (ref 5.0–8.0)
Protein Ur, POC: NEGATIVE mg/dL
Spec Grav, UA: 1.01 (ref 1.010–1.025)
Urobilinogen, UA: 0.2 E.U./dL

## 2018-05-22 LAB — POC MICROSCOPIC URINALYSIS (UMFC): MUCUS RE: ABSENT

## 2018-05-22 MED ORDER — CEPHALEXIN 500 MG PO CAPS: 500.0000 mg | ORAL_CAPSULE | Freq: Three times a day (TID) | ORAL | 0 refills | Status: AC

## 2018-05-22 NOTE — Patient Instructions (Addendum)
We are going to treat this for potential urinary tract infection with keflex. Please take as prescribed. We are also going to order a CT to rule out any kidney stone. They should contact you later this week for the CT. If any of your symptoms worsen or you develop new concerning symptoms, please seek care immediately. Follow up in one week with your urologist. Thank you for letting me participate in your health and well being.   Hematuria, Adult Hematuria is blood in your urine. It can be caused by a bladder infection, kidney infection, prostate infection, kidney stone, or cancer of your urinary tract. Infections can usually be treated with medicine, and a kidney stone usually will pass through your urine. If neither of these is the cause of your hematuria, further workup to find out the reason may be needed. It is very important that you tell your health care provider about any blood you see in your urine, even if the blood stops without treatment or happens without causing pain. Blood in your urine that happens and then stops and then happens again can be a symptom of a very serious condition. Also, pain is not a symptom in the initial stages of many urinary cancers. Follow these instructions at home:  Drink lots of fluid, 3-4 quarts a day. If you have been diagnosed with an infection, cranberry juice is especially recommended, in addition to large amounts of water.  Avoid caffeine, tea, and carbonated beverages because they tend to irritate the bladder.  Avoid alcohol because it may irritate the prostate.  Take all medicines as directed by your health care provider.  If you were prescribed an antibiotic medicine, finish it all even if you start to feel better.  If you have been diagnosed with a kidney stone, follow your health care provider's instructions regarding straining your urine to catch the stone.  Empty your bladder often. Avoid holding urine for long periods of time.  After a bowel  movement, women should cleanse front to back. Use each tissue only once.  Empty your bladder before and after sexual intercourse if you are a female. Contact a health care provider if:  You develop back pain.  You have a fever.  You have a feeling of sickness in your stomach (nausea) or vomiting.  Your symptoms are not better in 3 days. Return sooner if you are getting worse. Get help right away if:  You develop severe vomiting and are unable to keep the medicine down.  You develop severe back or abdominal pain despite taking your medicines.  You begin passing a large amount of blood or clots in your urine.  You feel extremely weak or faint, or you pass out. This information is not intended to replace advice given to you by your health care provider. Make sure you discuss any questions you have with your health care provider. Document Released: 12/11/2005 Document Revised: 05/18/2016 Document Reviewed: 08/11/2013 Elsevier Interactive Patient Education  2017 [employer].   IF you received an x-ray today, you will receive an invoice from [facility]. Please contact [facility] at [phone] with questions or concerns regarding your invoice.   IF you received labwork today, you will receive an invoice from [facility]. Please contact [facility] at [phone] with questions or concerns regarding your invoice.   Our billing staff will not be able to assist you with questions regarding bills from these companies.  You will be contacted with the lab results as soon as they are available.  The fastest way to get your results is to activate your My Chart account. Instructions are located on the last page of this paperwork. If you have not heard from [place] regarding the results in 2 weeks, please contact this office.

## 2018-05-22 NOTE — Progress Notes (Signed)
05/22/2018 at 7:16 PM  Alexandra Hood / DOB: May 05, 1971 / MRN: [id number]  The patient has Sarcoidosis; PSEUDOTUMOR CEREBRI; Unspecified vitamin D deficiency; DEPRESSION; VENTRICULAR TACHYCARDIA; Implantable defibrillator reprogramming/check -St. Jude; Keloid--ICD incision; Lupus pernio; Thrombocytosis ([place]); Obesity; Pre-diabetes; Cardiomyopathy, secondary-sarcoid; Sinus tachycardia; T wave over sensing; Multiple pulmonary nodules; Pulmonary sarcoidosis ([place]); Ventricular tachycardia ([place]); Pseudotumor cerebri; and Pertussis on their problem list.  SUBJECTIVE  Alexandra Hood is a 47 y.o. female who complains of dysuria, hematuria, urinary frequency, urinary urgency, suprapubic pressure, and flank pain x 1 day.  She denies  pelvic pain, cloudy malordorous urine, genital irritation and vaginal discharge. Has not tried anything for relief. Not currently sexually active.  Former smoker. Quit 2008. PSH of oophorectomy.  Has PMH of gross hematuria. Followed by urology. Last f/u was 09/2017. Could not identify cause. Was told to f/u in one year. Has PMH of UTI, notes this feels like her past UTIs. No PMH of kidney stone.   She  has a past medical history of Headache, Lupus pernio, Pertussis, Pseudotumor cerebri, Pulmonary sarcoidosis ([place]), Ventricular tachycardia ([place]), and Vitamin D deficiency.    Medications reviewed and updated by myself where necessary, and exist elsewhere in the encounter.   [name] has No Known Allergies. She  reports that she quit smoking about 11 years ago. Her smoking use included cigarettes. She has a 15.00 pack-year smoking history. She has never used smokeless tobacco. She reports that she does not drink alcohol or use drugs. She  reports that she does not currently engage in sexual activity. The patient  has a past surgical history that includes Oophorectomy (05 & 08); Cardiac defibrillator placement (march 2012); and Esophagogastroduodenoscopy  (egd) with propofol (N/A, 02/25/2016).  Her family history includes Allergies in her mother; COPD in her maternal grandfather; Diabetes in her father and mother; Heart attack in her paternal grandfather; Hyperlipidemia in her father, mother, and sister; Hypertension in her father, mother, and sister; Leukemia in her paternal grandmother; Meniere's disease in her sister; Migraines in her mother.  Review of Systems  Constitutional: Negative for chills, diaphoresis and fever.  HENT: Negative for sore throat.   Gastrointestinal: Negative for nausea and vomiting.  Musculoskeletal: Negative for myalgias.  Neurological: Negative for dizziness.   Social History   Socioeconomic History  . Marital status: Legally Separated    Spouse name: Not on file  . Number of children: 1  . Years of education: Not on file  . Highest education level: Master's degree (e.g., MA, MS, MEng, MEd, MSW, MBA)  Occupational History  . Occupation: school Information systems manager)  Social Needs  . Financial resource strain: Not on file  . Food insecurity:    Worry: Not on file    Inability: Not on file  . Transportation needs:    Medical: Not on file    Non-medical: Not on file  Tobacco Use  . Smoking status: Former Smoker    Packs/day: 1.00    Years: 15.00    Pack years: 15.00    Types: Cigarettes    Last attempt to quit: 03/03/2007    Years since quitting: 11.2  . Smokeless tobacco: Never Used  Substance and Sexual Activity  . Alcohol use: No    Alcohol/week: 0.0 oz  . Drug use: No  . Sexual activity: Not Currently    Comment: bilateral oophorectomy  Lifestyle  . Physical activity:    Days per week: Not on file    Minutes per session: Not on  file  . Stress: Not on file  Relationships  . Social connections:    Talks on phone: Not on file    Gets together: Not on file    Attends religious service: Not on file    Active member of club or organization: Not on file    Attends meetings of clubs or  organizations: Not on file    Relationship status: Not on file  . Intimate partner violence:    Fear of current or ex partner: Not on file    Emotionally abused: Not on file    Physically abused: Not on file    Forced sexual activity: Not on file  Other Topics Concern  . 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 She is separated, has one daughter. Lives in 2 story house, master on 1st. Drinks an occasional cup of coffee, 2 glasses of tea a day. Walks her dog daily.    OBJECTIVE  Her  height is 5' 5"  (1.651 m) and weight is 227 lb 12.8 oz (103.3 kg). Her oral temperature is 98.2 F (36.8 C). Her blood pressure is 130/86 and her pulse is 89. Her respiration is 18 and oxygen saturation is 99%.  The patient's body mass index is 37.91 kg/m.  Physical Exam  Constitutional: She is oriented to person, place, and time. She appears well-developed and well-nourished.  Non-toxic appearance. She does not appear ill. No distress.  HENT:  Head: Normocephalic and atraumatic.  Mouth/Throat: Uvula is midline, oropharynx is clear and moist and mucous membranes are normal. Tonsils are 1+ on the right. Tonsils are 1+ on the left. No tonsillar exudate.  Eyes: Conjunctivae are normal.  Neck: Normal range of motion.  Cardiovascular: Normal rate, regular rhythm and normal heart sounds.  Pulmonary/Chest: Effort normal.  Abdominal: Normal appearance. There is CVA tenderness (mild right side CVA tenderness noted). There is no rigidity and no guarding.  Genitourinary: Cervix exhibits no motion tenderness. No bleeding in the vagina. No vaginal discharge found.  Neurological: She is alert and oriented to person, place, and time.  Skin: Skin is warm and dry.  Psychiatric: She has a normal mood and affect.  Vitals reviewed.   Results for orders placed or performed in visit on 05/22/18  (from the past 24 hour(s))  POCT urinalysis dipstick     Status: Abnormal   Collection Time: 05/22/18  1:33 PM  Result Value Ref Range   Color, UA yellow yellow   Clarity, UA clear clear   Glucose, UA negative negative mg/dL   Bilirubin, UA negative negative   Ketones, POC UA negative negative mg/dL   Spec Grav, UA 1.010 1.010 - 1.025   Blood, UA large (A) negative   pH, UA 6.0 5.0 - 8.0   Protein Ur, POC negative negative mg/dL   Urobilinogen, UA 0.2 0.2 or 1.0 E.U./dL   Nitrite, UA Negative Negative   Leukocytes, UA Negative Negative  POCT Microscopic Urinalysis ([facility])     Status: Abnormal   Collection Time: 05/22/18  1:41 PM  Result Value Ref Range   WBC,UR,HPF,POC None None WBC/hpf   RBC,UR,HPF,POC Too numerous to count  (A) None RBC/hpf   Bacteria None None, Too numerous to count   Mucus Absent Absent   Epithelial Cells, UR Per Microscopy None None, Too numerous to count cells/hpf  POCT CBC  Status: Abnormal   Collection Time: 05/22/18  2:13 PM  Result Value Ref Range   WBC 10.9 (A) 4.6 - 10.2 K/uL   Lymph, poc 3.7 (A) 0.6 - 3.4   POC LYMPH PERCENT 33.6 10 - 50 %L   MID (cbc) 0.6 0 - 0.9   POC MID % 5.2 0 - 12 %M   POC Granulocyte 6.7 2 - 6.9   Granulocyte percent 61.2 37 - 80 %G   RBC 4.55 4.04 - 5.48 M/uL   Hemoglobin 12.4 12.2 - 16.2 g/dL   HCT, POC 38.9 37.7 - 47.9 %   MCV 85.5 80 - 97 fL   MCH, POC 27.3 27 - 31.2 pg   MCHC 32.0 31.8 - 35.4 g/dL   RDW, POC 14.7 %   Platelet Count, POC 416 142 - 424 K/uL   MPV 7.8 0 - 99.8 fL   Dg Abd 1 View  Result Date: 05/22/2018 CLINICAL DATA:  Right flank pain and hematuria for the past day. Suspect kidney stones. EXAM: ABDOMEN - 1 VIEW COMPARISON:  Abdominal CT scan of April 10, 2016. FINDINGS: The bowel gas pattern is normal. No abnormal calcifications project over the renal fossa E. Along the course of the ureters no stones are evident. No bladder stones are demonstrated. The bony structures exhibit no acute  abnormalities. IMPRESSION: No calcified urinary tract stones are observed. Electronically Signed   By: David  Martinique M.D.   On: 05/22/2018 14:23    ASSESSMENT & PLAN  Daliyah was seen today for dysuria.  Diagnoses and all orders for this visit:  Dysuria -     cephALEXin (KEFLEX) 500 MG capsule; Take 1 capsule (500 mg total) by mouth 3 (three) times daily for 7 days.  Hematuria, unspecified type -     POCT urinalysis dipstick -     Urine Culture -     POCT Microscopic Urinalysis ([facility]) -     DG Abd 1 View; Future -     POCT CBC -     CMP14+EGFR -     CT RENAL STONE STUDY; Future   This case was precepted with Dr. Mitchel Honour. Hx most consistent with UTI. However, no lueks, nitrites, or WBCs noted on UA and micro. No proteinuria or casts noted.  With hematuria, cannot rule out kidney stone. KUB normal. Due to clinical symptoms, will treat empirically for UTI while awaiting urine culture and plan for urgent renal CT. Do not suspect pyelonephritis as pt is well appearing, no distress. Vitals stable. WBC mildly elevated at 10.9. Recommend she contact her urologist and schedule follow up appointment within next week. The patient was advised to call or come back to clinic if she does not see an improvement in symptoms, or worsens with the above plan.   Tenna Delaine, PA-C  [facility] 05/22/2018 7:16 PM

## 2018-05-23 LAB — CMP14+EGFR
ALK PHOS: 109 IU/L (ref 39–117)
ALT: 14 IU/L (ref 0–32)
AST: 10 IU/L (ref 0–40)
Albumin/Globulin Ratio: 1.7 (ref 1.2–2.2)
Albumin: 4.6 g/dL (ref 3.5–5.5)
BUN/Creatinine Ratio: 18 (ref 9–23)
BUN: 11 mg/dL (ref 6–24)
CHLORIDE: 102 mmol/L (ref 96–106)
CO2: 25 mmol/L (ref 20–29)
Calcium: 9.8 mg/dL (ref 8.7–10.2)
Creatinine, Ser: 0.62 mg/dL (ref 0.57–1.00)
GFR calc Af Amer: 127 mL/min/{1.73_m2} (ref >59)
GFR calc non Af Amer: 110 mL/min/{1.73_m2} (ref >59)
GLUCOSE: 87 mg/dL (ref 65–99)
Globulin, Total: 2.7 g/dL (ref 1.5–4.5)
Potassium: 4.7 mmol/L (ref 3.5–5.2)
Sodium: 140 mmol/L (ref 134–144)
TOTAL PROTEIN: 7.3 g/dL (ref 6.0–8.5)

## 2018-05-23 LAB — URINE CULTURE: ORGANISM ID, BACTERIA: NO GROWTH

## 2018-05-23 NOTE — Addendum Note (Signed)
Addended by: Tenna Delaine D on: 05/23/2018 01:40 PM   Modules accepted: Orders

## 2018-05-24 ENCOUNTER — Ambulatory Visit (HOSPITAL_COMMUNITY)
Admission: RE | Admit: 2018-05-24 | Discharge: 2018-05-24 | Disposition: A | Discharge status: Home / Self Care | Payer: BC Managed Care – PPO | Source: Ambulatory Visit | Attending: Physician Assistant | Admitting: Physician Assistant

## 2018-05-24 ENCOUNTER — Ambulatory Visit (INDEPENDENT_AMBULATORY_CARE_PROVIDER_SITE_OTHER): Payer: Self-pay | Admitting: *Deleted

## 2018-05-24 ENCOUNTER — Telehealth: Payer: Self-pay | Admitting: Family Medicine

## 2018-05-24 DIAGNOSIS — Z95810 Presence of automatic (implantable) cardiac defibrillator: Secondary | ICD-10-CM

## 2018-05-24 DIAGNOSIS — R590 Localized enlarged lymph nodes: Secondary | ICD-10-CM | POA: Insufficient documentation

## 2018-05-24 DIAGNOSIS — R918 Other nonspecific abnormal finding of lung field: Secondary | ICD-10-CM | POA: Insufficient documentation

## 2018-05-24 DIAGNOSIS — R319 Hematuria, unspecified: Secondary | ICD-10-CM | POA: Insufficient documentation

## 2018-05-24 NOTE — Progress Notes (Signed)
Remote ICD transmission.   

## 2018-05-24 NOTE — Telephone Encounter (Signed)
Copied from [place] [id number]. Topic: Quick Communication - See Telephone Encounter >> May 24, 2018  1:04 PM Rutherford Nail, NT wrote: CRM for notification. See Telephone encounter for: 05/24/18. PAtient calling and is wondering if pain medication could be sent in to the pharmacy.  Pain is in her back and lower abdomen. States that she had a CT scan this morning (05/24/18) and has been seen for the issue on 05/22/18. Please advise.  CB#: [phone] [id number] - [place], [address] AT [place] [address]

## 2018-05-27 ENCOUNTER — Telehealth: Payer: Self-pay | Admitting: *Deleted

## 2018-05-27 ENCOUNTER — Encounter: Payer: Self-pay | Admitting: Cardiology

## 2018-05-27 NOTE — Telephone Encounter (Signed)
Please advise 

## 2018-05-27 NOTE — Telephone Encounter (Signed)
Patient is calling looking for pain medication.  She states she is going to call her urologist but the advil she is taking is not working.

## 2018-05-28 NOTE — Telephone Encounter (Signed)
Pt still having flank pain. Went to see urologist but left due to PA running behind. Does not follow up with them until 6/17. Rec increasing ibuprofen from 200mg  to 600-800mg  every 8 hours and using heating pad. Contact me in 2 days if no improvement with recommendations.

## 2018-05-28 NOTE — Telephone Encounter (Signed)
Was seen by Timmothy Euler for this acute issue

## 2018-06-04 LAB — CUP PACEART REMOTE DEVICE CHECK
Battery Voltage: 2.61 V
Brady Statistic RA Percent Paced: 0.01 %
Date Time Interrogation Session: 20190531073329
HIGH POWER IMPEDANCE MEASURED VALUE: 266 Ohm
HighPow Impedance: 41 Ohm
HighPow Impedance: 51 Ohm
Implantable Lead Implant Date: 20120308
Implantable Lead Location: 753860
Implantable Lead Model: 5076
Implantable Pulse Generator Implant Date: 20120308
Lead Channel Impedance Value: 399 Ohm
Lead Channel Impedance Value: 437 Ohm
Lead Channel Pacing Threshold Pulse Width: 0.4 ms
Lead Channel Sensing Intrinsic Amplitude: 2.125 mV
MDC IDC LEAD IMPLANT DT: 20120308
MDC IDC LEAD LOCATION: 753859
MDC IDC MSMT LEADCHNL RA PACING THRESHOLD AMPLITUDE: 0.5 V
MDC IDC MSMT LEADCHNL RA SENSING INTR AMPL: 2.125 mV
MDC IDC MSMT LEADCHNL RV PACING THRESHOLD AMPLITUDE: 0.625 V
MDC IDC MSMT LEADCHNL RV PACING THRESHOLD PULSEWIDTH: 0.4 ms
MDC IDC MSMT LEADCHNL RV SENSING INTR AMPL: 4.875 mV
MDC IDC MSMT LEADCHNL RV SENSING INTR AMPL: 4.875 mV
MDC IDC SET LEADCHNL RA PACING AMPLITUDE: 2 V
MDC IDC SET LEADCHNL RV PACING AMPLITUDE: 2.5 V
MDC IDC SET LEADCHNL RV PACING PULSEWIDTH: 0.4 ms
MDC IDC SET LEADCHNL RV SENSING SENSITIVITY: 0.45 mV
MDC IDC STAT BRADY AP VP PERCENT: 0 %
MDC IDC STAT BRADY AP VS PERCENT: 0.01 %
MDC IDC STAT BRADY AS VP PERCENT: 0.04 %
MDC IDC STAT BRADY AS VS PERCENT: 99.95 %
MDC IDC STAT BRADY RV PERCENT PACED: 0.04 %

## 2018-06-15 DIAGNOSIS — R310 Gross hematuria: Secondary | ICD-10-CM | POA: Insufficient documentation

## 2018-06-25 ENCOUNTER — Telehealth: Payer: Self-pay | Admitting: Cardiology

## 2018-06-25 ENCOUNTER — Encounter: Payer: BC Managed Care – PPO | Admitting: *Deleted

## 2018-06-25 NOTE — Telephone Encounter (Signed)
LMOVM reminding pt to send remote transmission.   

## 2018-06-26 ENCOUNTER — Encounter: Payer: Self-pay | Admitting: Cardiology

## 2018-06-26 ENCOUNTER — Telehealth: Payer: Self-pay | Admitting: Cardiology

## 2018-06-26 NOTE — Telephone Encounter (Signed)
Patient called and needed help trouble shooting her home monitor. After one unsuccessful attempt I instructed pt to call tech support for further assistance.

## 2018-07-02 ENCOUNTER — Ambulatory Visit (INDEPENDENT_AMBULATORY_CARE_PROVIDER_SITE_OTHER): Payer: BC Managed Care – PPO | Admitting: *Deleted

## 2018-07-02 DIAGNOSIS — I472 Ventricular tachycardia, unspecified: Secondary | ICD-10-CM

## 2018-07-03 NOTE — Progress Notes (Signed)
Remote ICD transmission.   

## 2018-07-17 ENCOUNTER — Telehealth: Payer: Self-pay | Admitting: Neurology

## 2018-07-17 DIAGNOSIS — G43409 Hemiplegic migraine, not intractable, without status migrainosus: Secondary | ICD-10-CM

## 2018-07-17 MED ORDER — DICLOFENAC POTASSIUM(MIGRAINE) 50 MG PO PACK: 50.0000 mg | PACK | ORAL | 1 refills | Status: DC

## 2018-07-17 NOTE — Telephone Encounter (Signed)
Patient has been using [facility] [place]? Please Call. She uses [facility] on [address] and [address]. Thanks

## 2018-07-30 LAB — CUP PACEART REMOTE DEVICE CHECK
Battery Voltage: 2.6 V
Brady Statistic RA Percent Paced: 0.01 %
HighPow Impedance: 304 Ohm
HighPow Impedance: 45 Ohm
HighPow Impedance: 60 Ohm
Implantable Lead Implant Date: 20120308
Implantable Lead Implant Date: 20120308
Implantable Lead Location: 753859
Implantable Lead Location: 753860
Implantable Lead Model: 6947
Implantable Pulse Generator Implant Date: 20120308
Lead Channel Impedance Value: 399 Ohm
Lead Channel Impedance Value: 494 Ohm
Lead Channel Pacing Threshold Pulse Width: 0.4 ms
Lead Channel Pacing Threshold Pulse Width: 0.4 ms
Lead Channel Sensing Intrinsic Amplitude: 1.875 mV
Lead Channel Sensing Intrinsic Amplitude: 1.875 mV
MDC IDC MSMT LEADCHNL RA PACING THRESHOLD AMPLITUDE: 0.375 V
MDC IDC MSMT LEADCHNL RV PACING THRESHOLD AMPLITUDE: 0.75 V
MDC IDC MSMT LEADCHNL RV SENSING INTR AMPL: 5.5 mV
MDC IDC MSMT LEADCHNL RV SENSING INTR AMPL: 5.5 mV
MDC IDC SESS DTM: 20190709225729
MDC IDC SET LEADCHNL RA PACING AMPLITUDE: 2 V
MDC IDC SET LEADCHNL RV PACING AMPLITUDE: 2.5 V
MDC IDC SET LEADCHNL RV PACING PULSEWIDTH: 0.4 ms
MDC IDC SET LEADCHNL RV SENSING SENSITIVITY: 0.45 mV
MDC IDC STAT BRADY AP VP PERCENT: 0 %
MDC IDC STAT BRADY AP VS PERCENT: 0.01 %
MDC IDC STAT BRADY AS VP PERCENT: 0.04 %
MDC IDC STAT BRADY AS VS PERCENT: 99.95 %
MDC IDC STAT BRADY RV PERCENT PACED: 0.04 %

## 2018-08-05 ENCOUNTER — Telehealth: Payer: Self-pay | Admitting: *Deleted

## 2018-08-05 NOTE — Telephone Encounter (Signed)
Remote monitoring alert for ICD being ERI. Ms. Alexandra Hood is aware- agreeable to have the scheduler call and make her an appointment to discuss generator replacement. She will let us know if she would like to come have the alert turned off prior to appointment.

## 2018-08-12 ENCOUNTER — Encounter: Payer: Self-pay | Admitting: Internal Medicine

## 2018-08-12 ENCOUNTER — Ambulatory Visit: Payer: BC Managed Care – PPO | Admitting: Internal Medicine

## 2018-08-12 VITALS — BP 138/90 | HR 93 | Resp 16 | Ht 65.0 in | Wt 226.6 lb

## 2018-08-12 DIAGNOSIS — I472 Ventricular tachycardia, unspecified: Secondary | ICD-10-CM

## 2018-08-12 DIAGNOSIS — I429 Cardiomyopathy, unspecified: Secondary | ICD-10-CM | POA: Diagnosis not present

## 2018-08-12 DIAGNOSIS — Z95810 Presence of automatic (implantable) cardiac defibrillator: Secondary | ICD-10-CM

## 2018-08-12 NOTE — H&P (View-Only) (Signed)
Patient Care Team: Hillis Range as PCP - General (Family Medicine) Deboraha Sprang, MD as Consulting Physician (Cardiology)   HPI  Alexandra Hood is a 47 y.o. female Is seen in followup for ventricular tachycardia noted on the monitor.  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   She is s/p ICD implantation 3/12  Her biggest complaint is fatigue. 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 Headache   . Lupus pernio    -skin bx confirmed at [facility] 05/07/2009 (personally reviewed) -looks improved 07/09/2009 -  . Pertussis   . Pseudotumor cerebri    .Marland KitchenMarland KitchenDr Sabra Heck and Dr. Katy Fitch - dxed Jan 2009, on diamox  - LP 01/24/2008 at [facility] - Normal ( pressure 14cm, gluc 68, Protein 23, Clear, colorless, RBC 3, WBC - too few to count)  - CT head non contrast done at [facility] 11/25/2007: "normal". Ddoes not report MRI, MRA, MRV being done - 2nd opinon by Dr. Leonie Man - reportdly had MRI/MRV both normal; followup pending  . Pulmonary sarcoidosis ([place])    - Spiro 07/25/2010 on pred 10/mtx 10 -> FEv1 1.84L/775%, FVC 2.25L - SPiro 09/06/2010 -> Fev1 2.14L/81%. Cut pred to 71m per day. Cont mtx - SPiro 01/20/2011 - Fev1 1.6L/61%, Ratio 74%. CXR Worse -> increased pred to 197mperday. Cont Mtx.    . Ventricular tachycardia ([facility]  . Vitamin D deficiency    - Aug 2011: 22 and low -> commence Rx. - Jan 2012: 11    Past Surgical History:  Procedure Laterality Date  . CARDIAC DEFIBRILLATOR PLACEMENT  march 2012   Medtronic  . ESOPHAGOGASTRODUODENOSCOPY (EGD) WITH PROPOFOL N/A 02/25/2016   Procedure: ESOPHAGOGASTRODUODENOSCOPY (EGD) WITH PROPOFOL;  Surgeon: JaLaurence SpatesMD;  Location: [facility];  Service: Endoscopy;  Laterality: N/A;    . 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 acetaminophen (TYLENOL) 500 MG tablet Take 1,000 mg by mouth 3 (three) times daily as needed (pain).    . Diclofenac Potassium 50 MG PACK Take 50 mg by mouth as directed. Mix with 2-3 oz of water. 9 each 1  . folic acid (FOLVITE) 80974CG tablet Take 800 mcg by mouth every morning.    . Marland Kitchenbuprofen (ADVIL,MOTRIN) 600 MG tablet Take 1 tablet (600 mg total) by mouth every 8 (eight) hours as needed. 30 tablet 0  . methotrexate (RHEUMATREX) 2.5 MG tablet Take 2 tablets (5 mg total) by mouth once a week. Caution:Chemotherapy. Protect from light. 8 tablet 5  . methylPREDNISolone (MEDROL) 4 MG tablet TAKE 1 TABLET BY MOUTH EVERY DAY, CONTINUE UNTIL STOPPED BY MD 30 tablet 3  . QVAR 80 MCG/ACT inhaler INHALE 2 PUFFS TWICE DAILY 8.7 g 2  . topiramate (TOPAMAX) 100 MG tablet Take 1 tablet (100 mg total) by mouth at bedtime. 30 tablet 3   No current facility-administered medications for this visit.     No Known Allergies  Review of Systems negative except from HPI and PMH  Physical Exam BP 138/90   Pulse 93   Resp 16   Ht 5' 5" (1.651 m)  Wt 226 lb 9.6 oz (102.8 kg)   LMP 12/25/2006   BMI 37.71 kg/m  Well developed and nourished in no acute distress HENT normal Neck supple with JVP-flat Clear Device pocket well healed; without hematoma or erythema.  There is no tethering  Regular rate and rhythm, no murmurs or gallops Abd-soft with active BS No Clubbing cyanosis edema Skin-warm and dry A & Oriented  Grossly normal sensory and motor function   ECG sinus 93 15/07/34  Assessment and  Plan   Ventricular tachycardia   Implantable defibrillator-Medtronic @  ERI  Cardiomyopathy-sarcoid  Hypertension   Dypsnea on exertion   Obese     BP still a little elevated  We have reviewed the benefits and risks of generator replacement.  These include but are not limited to  lead fracture and infection.  The patient understands, agrees and is willing to proceed.    With fatigue, will check CRP and ESR  Quick data search demonstrates that these can be assoc with sarcoid flare.   We spent more than 50% of our >25 min visit in face to face counseling regarding the above      

## 2018-08-12 NOTE — Progress Notes (Signed)
Patient Care Team: Hillis Range as PCP - General (Family Medicine) Deboraha Sprang, MD as Consulting Physician (Cardiology)   HPI  Alexandra Hood is a 47 y.o. female Is seen in followup for ventricular tachycardia noted on the monitor.  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   She is s/p ICD implantation 3/12  Her biggest complaint is fatigue. 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 Headache   . Lupus pernio    -skin bx confirmed at [facility] 05/07/2009 (personally reviewed) -looks improved 07/09/2009 -  . Pertussis   . Pseudotumor cerebri    .Marland KitchenMarland KitchenDr Sabra Heck and Dr. Katy Fitch - dxed Jan 2009, on diamox  - LP 01/24/2008 at [facility] - Normal ( pressure 14cm, gluc 68, Protein 23, Clear, colorless, RBC 3, WBC - too few to count)  - CT head non contrast done at [facility] 11/25/2007: "normal". Ddoes not report MRI, MRA, MRV being done - 2nd opinon by Dr. Leonie Man - reportdly had MRI/MRV both normal; followup pending  . Pulmonary sarcoidosis ([place])    - Spiro 07/25/2010 on pred 10/mtx 10 -> FEv1 1.84L/775%, FVC 2.25L - SPiro 09/06/2010 -> Fev1 2.14L/81%. Cut pred to 71m per day. Cont mtx - SPiro 01/20/2011 - Fev1 1.6L/61%, Ratio 74%. CXR Worse -> increased pred to 197mperday. Cont Mtx.    . Ventricular tachycardia ([facility]  . Vitamin D deficiency    - Aug 2011: 22 and low -> commence Rx. - Jan 2012: 11    Past Surgical History:  Procedure Laterality Date  . CARDIAC DEFIBRILLATOR PLACEMENT  march 2012   Medtronic  . ESOPHAGOGASTRODUODENOSCOPY (EGD) WITH PROPOFOL N/A 02/25/2016   Procedure: ESOPHAGOGASTRODUODENOSCOPY (EGD) WITH PROPOFOL;  Surgeon: JaLaurence SpatesMD;  Location: [facility];  Service: Endoscopy;  Laterality: N/A;    . 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 acetaminophen (TYLENOL) 500 MG tablet Take 1,000 mg by mouth 3 (three) times daily as needed (pain).    . Diclofenac Potassium 50 MG PACK Take 50 mg by mouth as directed. Mix with 2-3 oz of water. 9 each 1  . folic acid (FOLVITE) 80974CG tablet Take 800 mcg by mouth every morning.    . Marland Kitchenbuprofen (ADVIL,MOTRIN) 600 MG tablet Take 1 tablet (600 mg total) by mouth every 8 (eight) hours as needed. 30 tablet 0  . methotrexate (RHEUMATREX) 2.5 MG tablet Take 2 tablets (5 mg total) by mouth once a week. Caution:Chemotherapy. Protect from light. 8 tablet 5  . methylPREDNISolone (MEDROL) 4 MG tablet TAKE 1 TABLET BY MOUTH EVERY DAY, CONTINUE UNTIL STOPPED BY MD 30 tablet 3  . QVAR 80 MCG/ACT inhaler INHALE 2 PUFFS TWICE DAILY 8.7 g 2  . topiramate (TOPAMAX) 100 MG tablet Take 1 tablet (100 mg total) by mouth at bedtime. 30 tablet 3   No current facility-administered medications for this visit.     No Known Allergies  Review of Systems negative except from HPI and PMH  Physical Exam BP 138/90   Pulse 93   Resp 16   Ht 5' 5" (1.651 m)  Wt 226 lb 9.6 oz (102.8 kg)   LMP 12/25/2006   BMI 37.71 kg/m  Well developed and nourished in no acute distress HENT normal Neck supple with JVP-flat Clear Device pocket well healed; without hematoma or erythema.  There is no tethering  Regular rate and rhythm, no murmurs or gallops Abd-soft with active BS No Clubbing cyanosis edema Skin-warm and dry A & Oriented  Grossly normal sensory and motor function   ECG sinus 93 15/07/34  Assessment and  Plan   Ventricular tachycardia   Implantable defibrillator-Medtronic @  ERI  Cardiomyopathy-sarcoid  Hypertension   Dypsnea on exertion   Obese     BP still a little elevated  We have reviewed the benefits and risks of generator replacement.  These include but are not limited to  lead fracture and infection.  The patient understands, agrees and is willing to proceed.    With fatigue, will check CRP and ESR  Quick data search demonstrates that these can be assoc with sarcoid flare.   We spent more than 50% of our >25 min visit in face to face counseling regarding the above      

## 2018-08-12 NOTE — Patient Instructions (Addendum)
Medication Instructions:  Your physician recommends that you continue on your current medications as directed. Please refer to the Current Medication list given to you today.  Labwork: Your physician recommends that you return for lab work September 9th. You may come in at your convenience between 8am and 4:30pm.    Testing/Procedures: Your physician has recommended that you have a defibrillator battery inserted. An implantable cardioverter defibrillator (ICD) is a small device that is placed in your chest or, in rare cases, your abdomen. This device uses electrical pulses or shocks to help control life-threatening, irregular heartbeats that could lead the heart to suddenly stop beating (sudden cardiac arrest). Leads are attached to the ICD that goes into your heart. This is done in the hospital and usually requires an overnight stay. Please see the instruction sheet given to you today for more information.   Follow-Up: Your physician recommends that you schedule a follow-up appointment in:   10-14 days from 9/13 for a wound check 91 days from 9/13 with Dr Caryl Comes for a device check.   Any Other Special Instructions Will Be Listed Below (If Applicable).     If you need a refill on your cardiac medications before your next appointment, please call your pharmacy.

## 2018-08-20 NOTE — Progress Notes (Signed)
NEUROLOGY FOLLOW UP OFFICE NOTE  Elton Hood [id number]  HISTORY OF PRESENT ILLNESS: Alexandra Hood is a 47 year old female with sarcoidosis, ventricular tachycardia and history of idiopathic intracranial hypertension who follows up for migraine.  UPDATE: July was a bad month for headaches with weakness.  August is better. Intensity:  severe Duration:  Within 1 hour with Cambia. Frequency:  10-11 days Frequency of abortive medication: Tylenol  Current NSAIDS:  No.  She never received the Cambia (effective) Current analgesics:  Tylenol Current triptans:  Contraindicated (ventricular tachycardia and stroke-like symptoms) Current ergotamine:  no Current anti-emetic:  no Current muscle relaxants:  no Current anti-anxiolytic:  no Current sleep aide:  no Current Antihypertensive medications:  no Current Antidepressant medications:  no Current Anticonvulsant medications:  topiramate 100mg  at bedtime Current anti-CGRP:  no Current Vitamins/Herbal/Supplements:  no Current Antihistamines/Decongestants:  no Other therapy:  no Hormone/Birth control:  No Other medication:  Methotrexate.  Caffeine:  no Alcohol:  no Smoker:  no Diet:  hydrates Exercise:  walks Depression:  no; Anxiety:  no Other pain:  Pain related to sarcoidosis Sleep hygiene:  okay  HISTORY: She was diagnosed with pseudotumor cerebri in January 2009.  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  She underwent lumbar puncture on 01/24/08 which demonstrated normal opening pressure of 14 cm H2O with CSF glucose 68, protein 23 and 0 cell count.  She apparently had an MRI and MRV of the head performed, which were reportedly normal. She was on acetazolamide for awhile, which was subsequently discontinued due to resolution of papilledema.  She sees the ophthalmologist annually.  Those headaches were dull right sided posterior headaches.   She began experiencing  migraines around age 77.  They are right frontal/temporal, non-throbbing and severe.  They are associated with nausea, photophobia, phonophobia, osmophobia, but not vomiting.  She sometimes sees white spots in her vision and has associated right upper and lower extremity numbness and weakness.  They typically last 2 days and occur 2 to 3 times a month but have increased in frequency.  Nothing specifically triggers or aggravates them.  They are relieved by rest..  Past NSAIDS:  naproxen 550mg  (drowsiness), Advil Migraine, Sprix NS (ineffective and caused burning) Past analgesics:  Tylenol, Excedrin Past abortive triptans:  no Past muscle relaxants:  no Past anti-emetic:  Zofran ODT 4mg  Past antihypertensive medications:  Toprol XL 25mg  Past antidepressant medications:  no Past anticonvulsant medications:  acetazolamide Past vitamins/Herbal/Supplements:  no Past antihistamines/decongestants:  no Other past therapies:  no  Family history of headache:  mother  PAST MEDICAL HISTORY: Past Medical History:  Diagnosis Date  . Headache   . Lupus pernio    -skin bx confirmed at [facility] 05/07/2009 (personally reviewed) -looks improved 07/09/2009 -  . Pertussis   . Pseudotumor cerebri    .Marland KitchenMarland KitchenDr Sabra Heck and Dr. Katy Fitch - dxed Jan 2009, on diamox  - LP 01/24/2008 at [facility] - Normal ( pressure 14cm, gluc 68, Protein 23, Clear, colorless, RBC 3, WBC - too few to count)  - CT head non contrast done at [facility] 11/25/2007: "normal". Ddoes not report MRI, MRA, MRV being done - 2nd opinon by Dr. Leonie Man - reportdly had MRI/MRV both normal; followup pending  . Pulmonary sarcoidosis ([place])    - Spiro 07/25/2010 on pred 10/mtx 10 -> FEv1 1.84L/775%, FVC 2.25L - SPiro 09/06/2010 -> Fev1 2.14L/81%. Cut pred to 5mg  per day. Cont mtx - SPiro 01/20/2011 -  Fev1 1.6L/61%, Ratio 74%. CXR Worse -> increased pred to 10mg  perday. Cont Mtx.    . Ventricular tachycardia ([place])   . Vitamin D deficiency    - Aug 2011:  22 and low -> commence Rx. - Jan 2012: 11    MEDICATIONS: Current Outpatient Medications on File Prior to Visit  Medication Sig Dispense Refill  . acetaminophen (TYLENOL) 500 MG tablet Take 1,000 mg by mouth 3 (three) times daily as needed (pain).    . Diclofenac Potassium 50 MG PACK Take 50 mg by mouth as directed. Mix with 2-3 oz of water. 9 each 1  . folic acid (FOLVITE) 268 MCG tablet Take 800 mcg by mouth every morning.    Marland Kitchen ibuprofen (ADVIL,MOTRIN) 600 MG tablet Take 1 tablet (600 mg total) by mouth every 8 (eight) hours as needed. 30 tablet 0  . methotrexate (RHEUMATREX) 2.5 MG tablet Take 2 tablets (5 mg total) by mouth once a week. Caution:Chemotherapy. Protect from light. 8 tablet 5  . methylPREDNISolone (MEDROL) 4 MG tablet TAKE 1 TABLET BY MOUTH EVERY DAY, CONTINUE UNTIL STOPPED BY MD 30 tablet 3  . QVAR 80 MCG/ACT inhaler INHALE 2 PUFFS TWICE DAILY 8.7 g 2  . topiramate (TOPAMAX) 100 MG tablet Take 1 tablet (100 mg total) by mouth at bedtime. 30 tablet 3   No current facility-administered medications on file prior to visit.     ALLERGIES: No Known Allergies  FAMILY HISTORY: Family History  Problem Relation Age of Onset  . Allergies Mother   . Diabetes Mother   . Hypertension Mother   . Hyperlipidemia Mother   . Migraines Mother   . Hyperlipidemia Father   . Hypertension Father   . Diabetes Father   . Hyperlipidemia Sister   . Hypertension Sister   . Meniere's disease Sister   . COPD Maternal Grandfather   . Leukemia Paternal Grandmother   . Heart attack Paternal Grandfather    SOCIAL HISTORY: Social History   Socioeconomic History  . Marital status: Legally Separated    Spouse name: Not on file  . Number of children: 1  . Years of education: Not on file  . Highest education level: Master's degree (e.g., MA, MS, MEng, MEd, MSW, MBA)  Occupational History  . Occupation: school Information systems manager)  Social Needs  . Financial resource strain: Not on  file  . Food insecurity:    Worry: Not on file    Inability: Not on file  . Transportation needs:    Medical: Not on file    Non-medical: Not on file  Tobacco Use  . Smoking status: Former Smoker    Packs/day: 1.00    Years: 15.00    Pack years: 15.00    Types: Cigarettes    Last attempt to quit: 03/03/2007    Years since quitting: 11.4  . Smokeless tobacco: Never Used  Substance and Sexual Activity  . Alcohol use: No    Alcohol/week: 0.0 standard drinks  . Drug use: No  . Sexual activity: Not Currently    Comment: bilateral oophorectomy  Lifestyle  . Physical activity:    Days per week: Not on file    Minutes per session: Not on file  . Stress: Not on file  Relationships  . Social connections:    Talks on phone: Not on file    Gets together: Not on file    Attends religious service: Not on file    Active member of club or organization: Not on  file    Attends meetings of clubs or organizations: Not on file    Relationship status: Not on file  . Intimate partner violence:    Fear of current or ex partner: Not on file    Emotionally abused: Not on file    Physically abused: Not on file    Forced sexual activity: Not on file  Other Topics Concern  . 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 She is separated, has one daughter. Lives in 2 story house, master on 1st. Drinks an occasional cup of coffee, 2 glasses of tea a day. Walks her dog daily.    REVIEW OF SYSTEMS: Constitutional: No fevers, chills, or sweats, no generalized fatigue, change in appetite Eyes: No visual changes, double vision, eye pain Ear, nose and throat: No hearing loss, ear pain, nasal congestion, sore throat Cardiovascular: No chest pain, palpitations Respiratory:  No shortness of breath at rest or with exertion, wheezes GastrointestinaI: No nausea, vomiting, diarrhea,  abdominal pain, fecal incontinence Genitourinary:  No dysuria, urinary retention or frequency Musculoskeletal:  No neck pain, back pain Integumentary: No rash, pruritus, skin lesions Neurological: as above Psychiatric: No depression, insomnia, anxiety Endocrine: No palpitations, fatigue, diaphoresis, mood swings, change in appetite, change in weight, increased thirst Hematologic/Lymphatic:  No purpura, petechiae. Allergic/Immunologic: no itchy/runny eyes, nasal congestion, recent allergic reactions, rashes  PHYSICAL EXAM: Blood pressure 118/76, pulse 90, temperature 98.9 F (37.2 C), height 5\' 5"  (1.651 m), weight 226 lb (102.5 kg), last menstrual period 12/25/2006. General: No acute distress.  Patient appears well-groomed Head:  Normocephalic/atraumatic Eyes:  Fundi examined but not visualized Neck: supple, no paraspinal tenderness, full range of motion Heart:  Regular rate and rhythm Lungs:  Clear to auscultation bilaterally Back: No paraspinal tenderness Neurological Exam: alert and oriented to person, place, and time. Attention span and concentration intact, recent and remote memory intact, fund of knowledge intact.  Speech fluent and not dysarthric, language intact.  CN II-XII intact. Bulk and tone normal, muscle strength 5/5 throughout.  Sensation to light touch  intact.  Deep tendon reflexes 2+ throughout.  Finger to nose testing intact.  Gait normal, Romberg negative.  IMPRESSION: Hemiplegic migraine, not intractable History of idiopathic intracranial hypertension  PLAN: 1.  We will start Aimovig 70mg  monthly injection to help reduce headache frequency. 2.  Continue topiramate 100mg  at bedtime 3.  Use Cambia for abortive therapy 4.  Limit use of pain relievers to no more than 2 days out of week to prevent rebound headache 5.  Keep headache diary 6.  Follow up for routine eye exam 7.  Follow up with me in 4 months  Metta Clines, DO  CC: Philis Fendt, PA-C

## 2018-08-21 ENCOUNTER — Encounter: Payer: Self-pay | Admitting: Neurology

## 2018-08-21 ENCOUNTER — Ambulatory Visit: Payer: BC Managed Care – PPO | Admitting: Neurology

## 2018-08-21 VITALS — BP 118/76 | HR 90 | Temp 98.9°F | Ht 65.0 in | Wt 226.0 lb

## 2018-08-21 DIAGNOSIS — G43409 Hemiplegic migraine, not intractable, without status migrainosus: Secondary | ICD-10-CM

## 2018-08-21 DIAGNOSIS — G932 Benign intracranial hypertension: Secondary | ICD-10-CM

## 2018-08-21 MED ORDER — DICLOFENAC POTASSIUM(MIGRAINE) 50 MG PO PACK: 50.0000 mg | PACK | ORAL | 3 refills | Status: DC

## 2018-08-21 MED ORDER — DICLOFENAC POTASSIUM(MIGRAINE) 50 MG PO PACK: 50.0000 mg | PACK | ORAL | 0 refills | Status: DC

## 2018-08-21 MED ORDER — ERENUMAB-AOOE 70 MG/ML ~~LOC~~ SOAJ: 70.0000 mg | SUBCUTANEOUS | 11 refills | Status: DC

## 2018-08-21 NOTE — Patient Instructions (Signed)
1.  We will start Aimovig 70mg  monthly injection 2.  Continue topiramate 100mg  at bedtime 3.  For abortive therapy, take Cambia 4.  Limit pain relievers to no more than 2 days out of week to prevent rebound headache 5.  Keep headache diary 6.  Follow up in 4 months

## 2018-09-02 ENCOUNTER — Other Ambulatory Visit: Payer: BC Managed Care – PPO | Admitting: *Deleted

## 2018-09-02 DIAGNOSIS — Z95810 Presence of automatic (implantable) cardiac defibrillator: Secondary | ICD-10-CM

## 2018-09-02 DIAGNOSIS — I429 Cardiomyopathy, unspecified: Secondary | ICD-10-CM

## 2018-09-02 DIAGNOSIS — I472 Ventricular tachycardia, unspecified: Secondary | ICD-10-CM

## 2018-09-02 LAB — CBC
HEMOGLOBIN: 12.2 g/dL (ref 11.1–15.9)
Hematocrit: 37 % (ref 34.0–46.6)
MCH: 28.2 pg (ref 26.6–33.0)
MCHC: 33 g/dL (ref 31.5–35.7)
MCV: 86 fL (ref 79–97)
PLATELETS: 422 10*3/uL (ref 150–450)
RBC: 4.33 x10E6/uL (ref 3.77–5.28)
RDW: 14.7 % (ref 12.3–15.4)
WBC: 9.6 10*3/uL (ref 3.4–10.8)

## 2018-09-02 LAB — BASIC METABOLIC PANEL
BUN / CREAT RATIO: 11 (ref 9–23)
BUN: 8 mg/dL (ref 6–24)
CO2: 25 mmol/L (ref 20–29)
CREATININE: 0.7 mg/dL (ref 0.57–1.00)
Calcium: 9.5 mg/dL (ref 8.7–10.2)
Chloride: 103 mmol/L (ref 96–106)
GFR calc Af Amer: 121 mL/min/{1.73_m2} (ref >59)
GFR calc non Af Amer: 105 mL/min/{1.73_m2} (ref >59)
GLUCOSE: 86 mg/dL (ref 65–99)
POTASSIUM: 4.5 mmol/L (ref 3.5–5.2)
SODIUM: 142 mmol/L (ref 134–144)

## 2018-09-06 ENCOUNTER — Encounter (HOSPITAL_COMMUNITY): Payer: Self-pay | Admitting: Internal Medicine

## 2018-09-06 ENCOUNTER — Encounter (HOSPITAL_COMMUNITY)
Admission: RE | Disposition: A | Discharge status: Home / Self Care | Payer: Self-pay | Source: Ambulatory Visit | Attending: Internal Medicine

## 2018-09-06 ENCOUNTER — Other Ambulatory Visit: Payer: Self-pay

## 2018-09-06 ENCOUNTER — Other Ambulatory Visit: Payer: Self-pay | Admitting: Cardiology

## 2018-09-06 ENCOUNTER — Telehealth: Payer: Self-pay | Admitting: Cardiology

## 2018-09-06 ENCOUNTER — Ambulatory Visit (HOSPITAL_COMMUNITY)
Admission: RE | Admit: 2018-09-06 | Discharge: 2018-09-06 | Disposition: A | Discharge status: Home / Self Care | Payer: BC Managed Care – PPO | Source: Ambulatory Visit | Attending: Internal Medicine | Admitting: Internal Medicine

## 2018-09-06 DIAGNOSIS — I4729 Other ventricular tachycardia: Secondary | ICD-10-CM

## 2018-09-06 DIAGNOSIS — D863 Sarcoidosis of skin: Secondary | ICD-10-CM | POA: Insufficient documentation

## 2018-09-06 DIAGNOSIS — I472 Ventricular tachycardia, unspecified: Secondary | ICD-10-CM

## 2018-09-06 DIAGNOSIS — G932 Benign intracranial hypertension: Secondary | ICD-10-CM | POA: Insufficient documentation

## 2018-09-06 DIAGNOSIS — I428 Other cardiomyopathies: Secondary | ICD-10-CM | POA: Diagnosis not present

## 2018-09-06 DIAGNOSIS — E669 Obesity, unspecified: Secondary | ICD-10-CM | POA: Insufficient documentation

## 2018-09-06 DIAGNOSIS — Z6837 Body mass index (BMI) 37.0-37.9, adult: Secondary | ICD-10-CM | POA: Insufficient documentation

## 2018-09-06 DIAGNOSIS — I10 Essential (primary) hypertension: Secondary | ICD-10-CM | POA: Insufficient documentation

## 2018-09-06 DIAGNOSIS — Z95810 Presence of automatic (implantable) cardiac defibrillator: Secondary | ICD-10-CM

## 2018-09-06 DIAGNOSIS — Z4502 Encounter for adjustment and management of automatic implantable cardiac defibrillator: Secondary | ICD-10-CM | POA: Diagnosis not present

## 2018-09-06 DIAGNOSIS — D860 Sarcoidosis of lung: Secondary | ICD-10-CM | POA: Diagnosis not present

## 2018-09-06 HISTORY — PX: ICD GENERATOR CHANGEOUT: EP1231

## 2018-09-06 LAB — SURGICAL PCR SCREEN
MRSA, PCR: NEGATIVE
Staphylococcus aureus: NEGATIVE

## 2018-09-06 SURGERY — ICD GENERATOR CHANGEOUT

## 2018-09-06 MED ORDER — SODIUM CHLORIDE 0.9 % IV SOLN
INTRAVENOUS | Status: DC
  Administered 2018-09-06: 08:00:00 via INTRAVENOUS

## 2018-09-06 MED ORDER — MIDAZOLAM HCL 5 MG/5ML IJ SOLN
INTRAMUSCULAR | Status: DC | PRN
  Administered 2018-09-06 (×3): 1 mg via INTRAVENOUS
  Administered 2018-09-06: 2 mg via INTRAVENOUS

## 2018-09-06 MED ORDER — LIDOCAINE HCL (PF) 1 % IJ SOLN
INTRAMUSCULAR | Status: DC | PRN
  Administered 2018-09-06: 45 mL

## 2018-09-06 MED ORDER — CEFAZOLIN SODIUM-DEXTROSE 2-4 GM/100ML-% IV SOLN
2.0000 g | INTRAVENOUS | Status: AC
  Administered 2018-09-06: 2 g via INTRAVENOUS
  Filled 2018-09-06: qty 100

## 2018-09-06 MED ORDER — LIDOCAINE HCL (PF) 1 % IJ SOLN
INTRAMUSCULAR | Status: AC
  Filled 2018-09-06: qty 60

## 2018-09-06 MED ORDER — TRAMADOL HCL 50 MG PO TABS: 50.0000 mg | ORAL_TABLET | Freq: Four times a day (QID) | ORAL | 0 refills | Status: DC | PRN

## 2018-09-06 MED ORDER — MIDAZOLAM HCL 5 MG/5ML IJ SOLN
INTRAMUSCULAR | Status: AC
  Filled 2018-09-06: qty 5

## 2018-09-06 MED ORDER — MUPIROCIN 2 % EX OINT
TOPICAL_OINTMENT | Freq: Two times a day (BID) | CUTANEOUS | Status: DC
  Administered 2018-09-06: 08:00:00 via NASAL
  Filled 2018-09-06: qty 22

## 2018-09-06 MED ORDER — FENTANYL CITRATE (PF) 100 MCG/2ML IJ SOLN
INTRAMUSCULAR | Status: AC
  Filled 2018-09-06: qty 2

## 2018-09-06 MED ORDER — ONDANSETRON HCL 4 MG/2ML IJ SOLN: 4.0000 mg | Freq: Four times a day (QID) | INTRAMUSCULAR | Status: DC | PRN

## 2018-09-06 MED ORDER — ACETAMINOPHEN 325 MG PO TABS: 325.0000 mg | ORAL_TABLET | ORAL | Status: DC | PRN

## 2018-09-06 MED ORDER — SODIUM CHLORIDE 0.9 % IV SOLN
80.0000 mg | INTRAVENOUS | Status: AC
  Administered 2018-09-06: 80 mg

## 2018-09-06 MED ORDER — SODIUM CHLORIDE 0.9 % IV SOLN
INTRAVENOUS | Status: AC
  Filled 2018-09-06: qty 2

## 2018-09-06 MED ORDER — FENTANYL CITRATE (PF) 100 MCG/2ML IJ SOLN
INTRAMUSCULAR | Status: DC | PRN
  Administered 2018-09-06: 25 ug via INTRAVENOUS
  Administered 2018-09-06: 50 ug via INTRAVENOUS
  Administered 2018-09-06 (×2): 25 ug via INTRAVENOUS

## 2018-09-06 MED ORDER — CEFAZOLIN SODIUM-DEXTROSE 2-4 GM/100ML-% IV SOLN
INTRAVENOUS | Status: AC
  Filled 2018-09-06: qty 100

## 2018-09-06 MED ORDER — SODIUM CHLORIDE 0.9 % IV SOLN: INTRAVENOUS | Status: AC

## 2018-09-06 MED ORDER — CHLORHEXIDINE GLUCONATE 4 % EX LIQD
60.0000 mL | Freq: Once | CUTANEOUS | Status: DC
  Filled 2018-09-06: qty 60

## 2018-09-06 SURGICAL SUPPLY — 5 items
CABLE SURGICAL S-101-97-12 (CABLE) ×3 IMPLANT
ICD EVERA XT MRI DF1  DDMB1D1 (ICD Generator) ×2 IMPLANT
ICD EVERA XT MRI DF1 DDMB1D1 (ICD Generator) IMPLANT
PAD PRO RADIOLUCENT 2001M-C (PAD) ×3 IMPLANT
TRAY PACEMAKER INSERTION (PACKS) ×3 IMPLANT

## 2018-09-06 NOTE — Discharge Instructions (Signed)
Moderate Conscious Sedation, Adult, Care After These instructions provide you with information about caring for yourself after your procedure. Your health care provider may also give you more specific instructions. Your treatment has been planned according to current medical practices, but problems sometimes occur. Call your health care provider if you have any problems or questions after your procedure. What can I expect after the procedure? After your procedure, it is common:  To feel sleepy for several hours.  To feel clumsy and have poor balance for several hours.  To have poor judgment for several hours.  To vomit if you eat too soon.  Follow these instructions at home: For at least 24 hours after the procedure:   Do not: ? Participate in activities where you could fall or become injured. ? Drive. ? Use heavy machinery. ? Drink alcohol. ? Take sleeping pills or medicines that cause drowsiness. ? Make important decisions or sign legal documents. ? Take care of children on your own.  Rest. Eating and drinking  Follow the diet recommended by your health care provider.  If you vomit: ? Drink water, juice, or soup when you can drink without vomiting. ? Make sure you have little or no nausea before eating solid foods. General instructions  Have a responsible adult stay with you until you are awake and alert.  Take over-the-counter and prescription medicines only as told by your health care provider.  If you smoke, do not smoke without supervision.  Keep all follow-up visits as told by your health care provider. This is important. Contact a health care provider if:  You keep feeling nauseous or you keep vomiting.  You feel light-headed.  You develop a rash.  You have a fever. Get help right away if:  You have trouble breathing. This information is not intended to replace advice given to you by your health care provider. Make sure you discuss any questions you have  with your health care provider. Document Released: 10/01/2013 Document Revised: 05/15/2016 Document Reviewed: 04/01/2016 Elsevier Interactive Patient Education  2018 [employer].    Pacemaker Battery Change, Care After This sheet gives you information about how to care for yourself after your procedure. Your health care provider may also give you more specific instructions. If you have problems or questions, contact your health care provider. What can I expect after the procedure? After your procedure, it is common to have:  Pain or soreness at the site where the pacemaker was inserted.  Swelling at the site where the pacemaker was inserted.  Follow these instructions at home: Incision care  Keep the incision clean and dry. ? Do not take baths, swim, or use a hot tub until your health care provider approves. ? You may shower the day after your procedure, or as directed by your health care provider. ? Pat the area dry with a clean towel. Do not rub the area. This may cause bleeding.  Follow instructions from your health care provider about how to take care of your incision. Make sure you: ? Wash your hands with soap and water before you change your bandage (dressing). If soap and water are not available, use hand sanitizer. ? Change your dressing as told by your health care provider. ? Leave stitches (sutures), skin glue, or adhesive strips in place. These skin closures may need to stay in place for 2 weeks or longer. If adhesive strip edges start to loosen and curl up, you may trim the loose edges. Do not remove adhesive  strips completely unless your health care provider tells you to do that.  Check your incision area every day for signs of infection. Check for: ? More redness, swelling, or pain. ? More fluid or blood. ? Warmth. ? Pus or a bad smell. Activity  Do not lift anything that is heavier than 10 lb (4.5 kg) until your health care provider says it is okay to do so.  For  the first 2 weeks, or as long as told by your health care provider: ? Avoid lifting your left arm higher than your shoulder. ? Be gentle when you move your arms over your head. It is okay to raise your arm to comb your hair. ? Avoid strenuous exercise.  Ask your health care provider when it is okay to: ? Resume your normal activities. ? Return to work or school. ? Resume sexual activity. Eating and drinking  Eat a heart-healthy diet. This should include plenty of fresh fruits and vegetables, whole grains, low-fat dairy products, and lean protein like chicken and fish.  Limit alcohol intake to no more than 1 drink a day for non-pregnant women and 2 drinks a day for men. One drink equals 12 oz of beer, 5 oz of wine, or 1 oz of hard liquor.  Check ingredients and nutrition facts on packaged foods and beverages. Avoid the following types of food: ? Food that is high in salt (sodium). ? Food that is high in saturated fat, like full-fat dairy or red meat. ? Food that is high in trans fat, like fried food. ? Food and drinks that are high in sugar. Lifestyle  Do not use any products that contain nicotine or tobacco, such as cigarettes and e-cigarettes. If you need help quitting, ask your health care provider.  Take steps to manage and control your weight.  Get regular exercise. Aim for 150 minutes of moderate-intensity exercise (such as walking or yoga) or 75 minutes of vigorous exercise (such as running or swimming) each week.  Manage other health problems, such as diabetes or high blood pressure. Ask your health care provider how you can manage these conditions. General instructions  Do not drive for 24 hours after your procedure if you were given a medicine to help you relax (sedative).  Take over-the-counter and prescription medicines only as told by your health care provider.  Avoid putting pressure on the area where the pacemaker was placed.  If you need an MRI after your  pacemaker has been placed, be sure to tell the health care provider who orders the MRI that you have a pacemaker.  Avoid close and prolonged exposure to electrical devices that have strong magnetic fields. These include: ? Cell phones. Avoid keeping them in a pocket near the pacemaker, and try using the ear opposite the pacemaker. ? MP3 players. ? Household appliances, like microwaves. ? Metal detectors. ? Electric generators. ? High-tension wires.  Keep all follow-up visits as directed by your health care provider. This is important. Contact a health care provider if:  You have pain at the incision site that is not relieved by over-the-counter or prescription medicines.  You have any of these around your incision site or coming from it: ? More redness, swelling, or pain. ? Fluid or blood. ? Warmth to the touch. ? Pus or a bad smell.  You have a fever.  You feel brief, occasional palpitations, light-headedness, or any symptoms that you think might be related to your heart. Get help right away if:  You  experience chest pain that is different from the pain at the pacemaker site.  You develop a red streak that extends above or below the incision site.  You experience shortness of breath.  You have palpitations or an irregular heartbeat.  You have light-headedness that does not go away quickly.  You faint or have dizzy spells.  Your pulse suddenly drops or increases rapidly and does not return to normal.  You begin to gain weight and your legs and ankles swell. Summary  After your procedure, it is common to have pain, soreness, and some swelling where the pacemaker was inserted.  Make sure to keep your incision clean and dry. Follow instructions from your health care provider about how to take care of your incision.  Check your incision every day for signs of infection, such as more pain or swelling, pus or a bad smell, warmth, or leaking fluid and blood.  Avoid strenuous  exercise and lifting your left arm higher than your shoulder for 2 weeks, or as long as told by your health care provider. This information is not intended to replace advice given to you by your health care provider. Make sure you discuss any questions you have with your health care provider. Document Released: 10/01/2013 Document Revised: 11/02/2016 Document Reviewed: 11/02/2016 Elsevier Interactive Patient Education  2017 [employer].

## 2018-09-06 NOTE — Telephone Encounter (Signed)
Pt  With continued pain at device site, tylenol nor ibuprofen have helped, no drainage and minimal swelling.  Sent in Ultram 50 one every 6 hours. PRN, #15 no refills.

## 2018-09-06 NOTE — Interval H&P Note (Signed)
ICD Criteria  Current LVEF:50%. Within 12 months prior to implant: No   Heart failure history: Yes, Class I  Cardiomyopathy history: Yes, Non-Ischemic Cardiomyopathy.  Atrial Fibrillation/Atrial Flutter: No.  Ventricular tachycardia history: Yes, No hemodynamic instability. VT Type: Sustained Ventricular Tachycardia - Monomorphic.  Cardiac arrest history: No.  History of syndromes with risk of sudden death: Yes, Other  Previous ICD: Yes, Reason for ICD:  Primary prevention.  Current ICD indication: Primary  PPM indication: No.   Class I or II Bradycardia indication present: No  Beta Blocker therapy for 3 or more months: No, medical reason.  Ace Inhibitor/ARB therapy for 3 or more months: No, medical reason.  History and Physical Interval Note:  09/06/2018 8:09 AM  Alexandra Hood  has presented today for surgery, with the diagnosis of eri  The various methods of treatment have been discussed with the patient and family. After consideration of risks, benefits and other options for treatment, the patient has consented to  Procedure(s): ICD [place] (N/A) as a surgical intervention .  The patient's history has been reviewed, patient examined, no change in status, stable for surgery.  I have reviewed the patient's chart and labs.  Questions were answered to the patient's satisfaction.     Virl Axe

## 2018-09-09 MED FILL — Gentamicin Sulfate Inj 40 MG/ML: INTRAMUSCULAR | Qty: 80 | Status: AC

## 2018-09-16 ENCOUNTER — Telehealth: Payer: Self-pay | Admitting: *Deleted

## 2018-09-16 ENCOUNTER — Ambulatory Visit (INDEPENDENT_AMBULATORY_CARE_PROVIDER_SITE_OTHER): Payer: BC Managed Care – PPO | Admitting: *Deleted

## 2018-09-16 DIAGNOSIS — I472 Ventricular tachycardia, unspecified: Secondary | ICD-10-CM

## 2018-09-16 DIAGNOSIS — I429 Cardiomyopathy, unspecified: Secondary | ICD-10-CM

## 2018-09-16 DIAGNOSIS — Z95810 Presence of automatic (implantable) cardiac defibrillator: Secondary | ICD-10-CM

## 2018-09-16 LAB — CUP PACEART INCLINIC DEVICE CHECK
Brady Statistic AP VP Percent: 0 %
Brady Statistic AP VS Percent: 0.02 %
Brady Statistic AS VP Percent: 0.03 %
Brady Statistic RA Percent Paced: 0.02 %
Brady Statistic RV Percent Paced: 0.04 %
HIGH POWER IMPEDANCE MEASURED VALUE: 45 Ohm
HighPow Impedance: 61 Ohm
Implantable Lead Implant Date: 20120308
Implantable Lead Location: 753859
Implantable Lead Model: 5076
Lead Channel Impedance Value: 323 Ohm
Lead Channel Impedance Value: 456 Ohm
Lead Channel Pacing Threshold Amplitude: 0.5 V
Lead Channel Pacing Threshold Pulse Width: 0.4 ms
Lead Channel Sensing Intrinsic Amplitude: 6.125 mV
Lead Channel Sensing Intrinsic Amplitude: 6.25 mV
Lead Channel Setting Pacing Amplitude: 1.5 V
Lead Channel Setting Pacing Amplitude: 2 V
Lead Channel Setting Pacing Pulse Width: 0.4 ms
Lead Channel Setting Sensing Sensitivity: 0.45 mV
MDC IDC LEAD IMPLANT DT: 20120308
MDC IDC LEAD LOCATION: 753860
MDC IDC MSMT BATTERY REMAINING LONGEVITY: 126 mo
MDC IDC MSMT BATTERY VOLTAGE: 3.02 V
MDC IDC MSMT LEADCHNL RA PACING THRESHOLD AMPLITUDE: 0.5 V
MDC IDC MSMT LEADCHNL RA PACING THRESHOLD PULSEWIDTH: 0.4 ms
MDC IDC MSMT LEADCHNL RA SENSING INTR AMPL: 2.125 mV
MDC IDC MSMT LEADCHNL RA SENSING INTR AMPL: 3.125 mV
MDC IDC MSMT LEADCHNL RV IMPEDANCE VALUE: 437 Ohm
MDC IDC PG IMPLANT DT: 20190913
MDC IDC SESS DTM: 20190923102246
MDC IDC STAT BRADY AS VS PERCENT: 99.95 %

## 2018-09-16 MED ORDER — KETOROLAC TROMETHAMINE 10 MG PO TABS: 10.0000 mg | ORAL_TABLET | Freq: Four times a day (QID) | ORAL | 0 refills | Status: DC | PRN

## 2018-09-16 NOTE — Progress Notes (Signed)
Wound check appointment s/p generator replacement by Dr. Caryl Comes 09/06/18. Some dermabond removed, unable to remove all of the dermabond due to pain. I advised the patient to use a washcloth to gently wash the incision daily to help remove the dermabond. Wound without redness or edema. Incision edges approximated, wound well healed. Patient complaining of pain and soreness at the left chest pocket and clavicle, she requests that I inquire with Dr. Caryl Comes this afternoon about pain management.  Normal device function. Thresholds, sensing, and impedances consistent with implant measurements. Device programmed at chronic outputs. Histogram distribution appropriate for patient and level of activity. No mode switches or ventricular arrhythmias noted. 1 episode of SVT on 09/13/18, 1:1 at 182bpm (patient's father had a stroke and was admitted to the ICU that day). Patient educated about wound care, arm mobility, lifting restrictions, shock plan. ROV with Dr. Caryl Comes 11/14/18.

## 2018-09-16 NOTE — Telephone Encounter (Signed)
LVM for pt to call device clinic back regarding which pharmacy to send Rx in.

## 2018-09-16 NOTE — Telephone Encounter (Signed)
Spoke with Dr. Caryl Comes about pain management s/p ICD generator replacement. She would like something that can manage her pain but not make her sleepy. He gave a verbal order for toradol (ketorolac) 10mg  PO every 6 hours as needed (qty 20). I need to know which pharmacy to send this Rx to.

## 2018-09-16 NOTE — Telephone Encounter (Signed)
[facility] on [address]

## 2018-11-06 ENCOUNTER — Telehealth: Payer: Self-pay | Admitting: Neurology

## 2018-11-06 DIAGNOSIS — G43409 Hemiplegic migraine, not intractable, without status migrainosus: Secondary | ICD-10-CM

## 2018-11-06 NOTE — Telephone Encounter (Signed)
Then the Aimovig is not a cause of her hair loss.  I wouldn't stop trying Aimovig due to possibility of hair loss as it is not strongly identified with this side effect.  Otherwise, Emgality is another option.

## 2018-11-06 NOTE — Telephone Encounter (Signed)
Patient left message with after hours service regarding her Migraine she had and wanting to speak with you about the medication that was suggested from her Opthamologist. Please Call. Thanks

## 2018-11-06 NOTE — Telephone Encounter (Signed)
1.  She will need to have her eye doctor fax over her notes. 2.  Regarding aimovig, I'm not saying hair loss is not possible but it has not been identified as a standard side effect.  If she wants, we can switch her to [facility], which is another anti-CGRP and also has not been shown to be associated with hair loss. 3.  I don't understand why her cardiologist says she cannot take cambia, which is an NSAID, as she has prescribed her toradol, which is another NSAID.  In that case, I would recommend that she take the toradol for her migraines as well.  She again should be aware that she should limit use of pain relievers to no more than 2 days out of week to prevent risk of rebound or medication-overuse headache.

## 2018-11-06 NOTE — Telephone Encounter (Signed)
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 I advised her we will need the notes from that visit.   Pt said she is not able to take the Aimovig due to the side effect of possible hair loss, per her dermatologist. Her cardiologist advised her not to use Cambia

## 2018-11-08 MED ORDER — GALCANEZUMAB-GNLM 120 MG/ML ~~LOC~~ SOSY: 120.0000 mg | PREFILLED_SYRINGE | SUBCUTANEOUS | 11 refills | Status: DC

## 2018-11-08 NOTE — Telephone Encounter (Signed)
Called and spoke with Pt, advised her of recommendations, sent in Rx for Emgality. Pt said she called Dr Patrici Ranks office and was told the notes have been sent here x 2, she asked I call for notes. I called Dr Patrici Ranks office, they are closed on Friday afternoons, LMOVM asking for notes to be faxed to our clinical fax

## 2018-11-14 ENCOUNTER — Ambulatory Visit: Payer: BC Managed Care – PPO | Admitting: Internal Medicine

## 2018-11-14 ENCOUNTER — Encounter: Payer: Self-pay | Admitting: Internal Medicine

## 2018-11-14 VITALS — BP 138/94 | HR 92 | Ht 65.0 in | Wt 225.4 lb

## 2018-11-14 DIAGNOSIS — I472 Ventricular tachycardia, unspecified: Secondary | ICD-10-CM

## 2018-11-14 DIAGNOSIS — M25512 Pain in left shoulder: Secondary | ICD-10-CM | POA: Diagnosis not present

## 2018-11-14 DIAGNOSIS — Z95810 Presence of automatic (implantable) cardiac defibrillator: Secondary | ICD-10-CM

## 2018-11-14 DIAGNOSIS — I429 Cardiomyopathy, unspecified: Secondary | ICD-10-CM

## 2018-11-14 NOTE — Patient Instructions (Signed)
Medication Instructions:  Your physician recommends that you continue on your current medications as directed. Please refer to the Current Medication list given to you today.  Labwork: None ordered.  Testing/Procedures: None ordered.  Follow-Up: Your physician recommends that you schedule a follow-up appointment in:   10 months with Dr Caryl Comes  Remote monitoring is used to monitor your ICD from home. This monitoring reduces the number of office visits required to check your device to one time per year. It allows [place] to keep an eye on the functioning of your device to ensure it is working properly. You are scheduled for a device check from home on 02/13/2019. You may send your transmission at any time that day. If you have a wireless device, the transmission will be sent automatically. After your physician reviews your transmission, you will receive a postcard with your next transmission date.    Any Other Special Instructions Will Be Listed Below (If Applicable).     If you need a refill on your cardiac medications before your next appointment, please call your pharmacy.

## 2018-11-14 NOTE — Progress Notes (Signed)
Patient Care Team: Shawnee Knapp, MD as PCP - General (Family Medicine) Deboraha Sprang, MD as Consulting Physician (Cardiology)   HPI  Alexandra Hood is a 47 y.o. female Is seen in followup for ventricular tachycardia noted on the monitor.  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   She is s/p ICD implantation 3/12 and gen replacement 9/19  Her biggest complaint is fatigue. This continues to be a problem  Negative sleep study a few years ago  No edema     Wears sunglasses because of pseudotumor cerebri    Biggest complaint is pain at device site, esp medially   There is itching laterally  There is also tenderness over the clavicle which began after generator replacement.  Some what better  She has intentionally restricted her L arm motion    Past Medical History:  Diagnosis Date  . Headache   . Lupus pernio    -skin bx confirmed at [facility] 05/07/2009 (personally reviewed) -looks improved 07/09/2009 -  . Pertussis   . Pseudotumor cerebri    .Marland KitchenMarland KitchenDr Sabra Heck and Dr. Katy Fitch - dxed Jan 2009, on diamox  - LP 01/24/2008 at [facility] - Normal ( pressure 14cm, gluc 68, Protein 23, Clear, colorless, RBC 3, WBC - too few to count)  - CT head non contrast done at [facility] 11/25/2007: "normal". Ddoes not report MRI, MRA, MRV being done - 2nd opinon by Dr. Leonie Man - reportdly had MRI/MRV both normal; followup pending  . Pulmonary sarcoidosis ([place])    - Spiro 07/25/2010 on pred 10/mtx 10 -> FEv1 1.84L/775%, FVC 2.25L - SPiro 09/06/2010 -> Fev1 2.14L/81%. Cut pred to 5mg  per day. Cont mtx - SPiro 01/20/2011 - Fev1 1.6L/61%, Ratio 74%. CXR Worse -> increased pred to 10mg  perday. Cont Mtx.    . Ventricular tachycardia ([place])   . Vitamin D deficiency    - Aug 2011: 22 and low -> commence Rx. - Jan 2012: 11    Past Surgical History:  Procedure Laterality Date  . CARDIAC DEFIBRILLATOR PLACEMENT  march 2012   Medtronic  .  ESOPHAGOGASTRODUODENOSCOPY (EGD) WITH PROPOFOL N/A 02/25/2016   Procedure: ESOPHAGOGASTRODUODENOSCOPY (EGD) WITH PROPOFOL;  Surgeon: Laurence Spates, MD;  Location: [facility];  Service: Endoscopy;  Laterality: N/A;  . ICD GENERATOR CHANGEOUT N/A 09/06/2018   Procedure: ICD GENERATOR CHANGEOUT;  Surgeon: Deboraha Sprang, MD;  Location: [facility];  Service: Cardiovascular;  Laterality: N/A;  . 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 acetaminophen (TYLENOL) 500 MG tablet Take 500 mg by mouth as needed for mild pain.    . Galcanezumab-gnlm (EMGALITY) 120 MG/ML SOSY Inject 120 mg into the skin every 30 (thirty) days. 1 Syringe 11  . ibuprofen (ADVIL,MOTRIN) 200 MG tablet Take 200-600 mg by mouth daily as needed for headache or moderate pain.    . Multiple Vitamin (MULTIVITAMIN WITH MINERALS) TABS tablet Take 1 tablet by mouth daily.    Marland Kitchen topiramate (TOPAMAX) 100 MG tablet Take 1 tablet (100 mg total) by mouth at bedtime. 30 tablet 3   No current facility-administered medications for this visit.     No Known Allergies  Review of Systems negative except from HPI and PMH  Physical Exam BP (!) 138/94   Pulse 92   Ht 5\' 5"  (1.651 m)   Wt 225 lb 6.4  oz (102.2 kg)   LMP 12/25/2006   BMI 37.51 kg/m  Well developed and nourished in no acute distress HENT normal Neck supple with JVP-flat Clear Device pocket well healed; without hematoma or erythema.  There is no tethering  Tenderness medially, Regular rate and rhythm, no murmurs or gallops Abd-soft with active BS No Clubbing cyanosis edema Skin-warm and dry--no erythema  Clavicle tender to touch, moreso laterally than medially Markedly restricted L shoulder excursion A & Oriented  Grossly normal sensory and motor function   ECG sinus 93 15/07/34  Assessment and  Plan   Ventricular tachycardia   Implantable defibrillator-Medtronic    Cardiomyopathy-sarcoid  Hypertension   Dypsnea on exertion   Obese   Clavicular soreness and restricted motion     No intercurrent Ventricular tachycardia  Euvolemic continue current meds  Concerns with shoulder soreness are three.  1) infection b/c temporal association, 2) clavicular lesion 3) frozen shoulder the latter possibly related to positioning at the time of gen replacement  Spoke with radiologist and will need CT scan to evaluate

## 2018-11-19 ENCOUNTER — Encounter: Payer: Self-pay | Admitting: *Deleted

## 2018-11-19 ENCOUNTER — Telehealth: Payer: Self-pay | Admitting: *Deleted

## 2018-11-19 NOTE — Telephone Encounter (Signed)
Results and instructions sent to patient via My Chart.

## 2018-11-19 NOTE — Telephone Encounter (Signed)
-----   Message from Pieter Partridge, DO sent at 11/18/2018  7:36 AM EST ----- Dr. Zenia Resides note states very mild blurring of nasal disc margin but nothing dramatic.  We can increase topiramate to 150mg  at bedtime.

## 2018-11-20 ENCOUNTER — Other Ambulatory Visit (HOSPITAL_BASED_OUTPATIENT_CLINIC_OR_DEPARTMENT_OTHER): Payer: BC Managed Care – PPO

## 2018-11-25 ENCOUNTER — Ambulatory Visit (HOSPITAL_BASED_OUTPATIENT_CLINIC_OR_DEPARTMENT_OTHER)
Admission: RE | Admit: 2018-11-25 | Discharge: 2018-11-25 | Disposition: A | Discharge status: Home / Self Care | Payer: BC Managed Care – PPO | Source: Ambulatory Visit | Attending: Internal Medicine | Admitting: Internal Medicine

## 2018-11-25 DIAGNOSIS — M25512 Pain in left shoulder: Secondary | ICD-10-CM

## 2018-12-09 ENCOUNTER — Telehealth: Payer: Self-pay

## 2018-12-09 NOTE — Telephone Encounter (Signed)
Follow up ° °Pt returning call for nurse °

## 2018-12-09 NOTE — Telephone Encounter (Signed)
Reviewed CT results with patient who verbalized understanding.    She asks if it is OK to proceed with PT.  She understands Dr. Olin Pia nurse will call her to let her know to/not to proceed.

## 2018-12-09 NOTE — Telephone Encounter (Signed)
-----   Message from Deboraha Sprang, MD sent at 12/09/2018  9:26 AM EST ----- Please Inform Patient that study was normal  Thanks

## 2019-01-02 ENCOUNTER — Ambulatory Visit: Payer: BC Managed Care – PPO | Admitting: Neurology

## 2019-01-07 DIAGNOSIS — G43909 Migraine, unspecified, not intractable, without status migrainosus: Secondary | ICD-10-CM | POA: Insufficient documentation

## 2019-01-08 DIAGNOSIS — L659 Nonscarring hair loss, unspecified: Secondary | ICD-10-CM | POA: Insufficient documentation

## 2019-01-08 DIAGNOSIS — H4710 Unspecified papilledema: Secondary | ICD-10-CM | POA: Insufficient documentation

## 2019-01-10 ENCOUNTER — Ambulatory Visit: Payer: BC Managed Care – PPO | Admitting: Neurology

## 2019-02-13 NOTE — Progress Notes (Signed)
NEUROLOGY FOLLOW UP OFFICE NOTE  Alexandra Hood [id number]  HISTORY OF PRESENT ILLNESS: Alexandra Hood is a 48 year old African-American woman with sarcoidosis, ventricular tachycardia status post defibrillator, and history of idiopathic intracranial hypertension who follows up for migraine.  UPDATE: She was initially started on Aimovig but subsequently wished to discontinue it because she thought it contributed to her hair loss.  Instead, she was started on Emgality.  In November, she had a repeat eye exam with Dr. Katy Fitch, which showed mild blurring of the nasal disc margin but nothing dramatic.  Topiramate was increased to 150 mg at bedtime but affected her cognition so she went back to 100mg .  Has not been taking Cambia because she thought the [place] was in place of [place].  Intensity:  severe Duration:  2 days with time-release Tylenol Frequency:  in last 30 days, 1severe where she had to take off work.  She has a mild headache if it is raining.   Frequency of abortive medication: 2 days in past 30 days. Current NSAIDS:  none Current analgesics:  Tylenol Current triptans:  Contraindicated (ventricular tachycardia and stroke-like symptoms) Current ergotamine:  no Current anti-emetic:  no Current muscle relaxants:  no Current anti-anxiolytic:  no Current sleep aide:  no Current Antihypertensive medications:  no Current Antidepressant medications:  no Current Anticonvulsant medications:  topiramate 100mg  at bedtime Current anti-CGRP:   Emgality Current Vitamins/Herbal/Supplements:  no Current Antihistamines/Decongestants:  no Other therapy:  no Hormone/Birth control:  No  Caffeine:  no Alcohol:  no Smoker:  no Diet:  hydrates Exercise:  walks Depression:  no; Anxiety:  no Other pain:  Pain related to sarcoidosis Sleep hygiene:  okay  HISTORY: She was diagnosed with pseudotumor cerebri in January 2009. 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 She underwent lumbar puncture on 01/24/08 which demonstrated normal opening pressure of 14 cm H2O with CSF glucose 68, protein 23 and 0 cell count. She apparently had an MRI and MRV of the head performed, which were reportedly normal. She was on acetazolamide for awhile, which was subsequently discontinued due to resolution of papilledema. She sees the ophthalmologist annually. Those headaches were dull right sided posterior headaches.   She began experiencing migraines around age 64. They are right frontal/temporal, non-throbbing and severe. They are associated with nausea, photophobia, phonophobia, osmophobia, but not vomiting. She sometimes sees white spots in her vision and has associated right upper and lower extremity numbness and weakness. They typically last 2 days and occur 2 to 3 times a month but have increased in frequency. There are no known triggers.  They are relieved by rest.  Past NSAIDS: naproxen 550mg  (drowsiness), Advil Migraine, Sprix NS (ineffective and caused burning) Past analgesics: Tylenol, Excedrin Past abortive triptans: no Past muscle relaxants: no Past anti-emetic: Zofran ODT 4mg  Past antihypertensive medications:  Toprol XL 25mg  Past antidepressant medications: no Past anticonvulsant medications: acetazolamide Past anti--CGRP: Aimovig (possible hair loss) Past vitamins/Herbal/Supplements: no Past antihistamines/decongestants: no Other past therapies: no  Family history of headache: mother  PAST MEDICAL HISTORY: Past Medical History:  Diagnosis Date  . Headache   . Lupus pernio    -skin bx confirmed at [facility] 05/07/2009 (personally reviewed) -looks improved 07/09/2009 -  . Pertussis   . Pseudotumor cerebri    .Marland KitchenMarland KitchenDr Sabra Heck and Dr. Katy Fitch - dxed Jan 2009, on diamox  - LP [facility]/24/[facility] - Normal ( pressure 14cm, gluc 68, Protein 23, Clear, colorless, RBC 3, WBC -  too few to count)  - CT  head non contrast done at [facility] 11/25/2007: "normal". Ddoes not report MRI, MRA, MRV being done - 2nd opinon by Dr. Leonie Man - reportdly had MRI/MRV both normal; followup pending  . Pulmonary sarcoidosis ([place])    - Spiro 07/25/2010 on pred 10/mtx 10 -> FEv1 1.84L/775%, FVC 2.25L - SPiro 09/06/2010 -> Fev1 2.14L/81%. Cut pred to 5mg  per day. Cont mtx - SPiro 01/20/2011 - Fev1 1.6L/61%, Ratio 74%. CXR Worse -> increased pred to 10mg  perday. Cont Mtx.    . Ventricular tachycardia ([place])   . Vitamin D deficiency    - Aug 2011: 22 and low -> commence Rx. - Jan 2012: 11    MEDICATIONS: Current Outpatient Medications on File Prior to Visit  Medication Sig Dispense Refill  . acetaminophen (TYLENOL) 500 MG tablet Take 500 mg by mouth as needed for mild pain.    . Galcanezumab-gnlm (EMGALITY) 120 MG/ML SOSY Inject 120 mg into the skin every 30 (thirty) days. 1 Syringe 11  . ibuprofen (ADVIL,MOTRIN) 200 MG tablet Take 200-600 mg by mouth daily as needed for headache or moderate pain.    . Multiple Vitamin (MULTIVITAMIN WITH MINERALS) TABS tablet Take 1 tablet by mouth daily.    Marland Kitchen topiramate (TOPAMAX) 100 MG tablet Take 1 tablet (100 mg total) by mouth at bedtime. 30 tablet 3   No current facility-administered medications on file prior to visit.     ALLERGIES: No Known Allergies  FAMILY HISTORY: Family History  Problem Relation Age of Onset  . Allergies Mother   . Diabetes Mother   . Hypertension Mother   . Hyperlipidemia Mother   . Migraines Mother   . Hyperlipidemia Father   . Hypertension Father   . Diabetes Father   . Hyperlipidemia Sister   . Hypertension Sister   . Meniere's disease Sister   . COPD Maternal Grandfather   . Leukemia Paternal Grandmother   . Heart attack Paternal Grandfather    SOCIAL HISTORY: Social History   Socioeconomic History  . Marital status: Legally Separated    Spouse name: Not on file  . Number of children: 1  . Years of education: Not on file  .  Highest education level: Master's degree (e.g., MA, MS, MEng, MEd, MSW, MBA)  Occupational History  . Occupation: school Information systems manager)  Social Needs  . Financial resource strain: Not on file  . Food insecurity:    Worry: Not on file    Inability: Not on file  . Transportation needs:    Medical: Not on file    Non-medical: Not on file  Tobacco Use  . Smoking status: Former Smoker    Packs/day: 1.00    Years: 15.00    Pack years: 15.00    Types: Cigarettes    Last attempt to quit: 03/03/2007    Years since quitting: 11.9  . Smokeless tobacco: Never Used  Substance and Sexual Activity  . Alcohol use: No    Alcohol/week: 0.0 standard drinks  . Drug use: No  . Sexual activity: Not Currently    Comment: bilateral oophorectomy  Lifestyle  . Physical activity:    Days per week: Not on file    Minutes per session: Not on file  . Stress: Not on file  Relationships  . Social connections:    Talks on phone: Not on file    Gets together: Not on file    Attends religious service: Not on file  Active member of club or organization: Not on file    Attends meetings of clubs or organizations: Not on file    Relationship status: Not on file  . Intimate partner violence:    Fear of current or ex partner: Not on file    Emotionally abused: Not on file    Physically abused: Not on file    Forced sexual activity: Not on file  Other Topics Concern  . 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 She is separated, has one daughter. Lives in 2 story house, master on 1st. Drinks an occasional cup of coffee, 2 glasses of tea a day. Walks her dog daily.    REVIEW OF SYSTEMS: Constitutional: No fevers, chills, or sweats, no generalized fatigue, change in appetite Eyes: No visual changes, double vision, eye pain Ear, nose and throat: No hearing loss, ear pain,  nasal congestion, sore throat Cardiovascular: No chest pain, palpitations Respiratory:  No shortness of breath at rest or with exertion, wheezes GastrointestinaI: No nausea, vomiting, diarrhea, abdominal pain, fecal incontinence Genitourinary:  No dysuria, urinary retention or frequency Musculoskeletal:  No neck pain, back pain Integumentary: No rash, pruritus, skin lesions Neurological: as above Psychiatric: No depression, insomnia, anxiety Endocrine: No palpitations, fatigue, diaphoresis, mood swings, change in appetite, change in weight, increased thirst Hematologic/Lymphatic:  No purpura, petechiae. Allergic/Immunologic: no itchy/runny eyes, nasal congestion, recent allergic reactions, rashes  PHYSICAL EXAM: Blood pressure 108/76, pulse 91, height 5\' 5"  (1.651 m), weight 222 lb (100.7 kg), last menstrual period 12/25/2006, SpO2 96 %. General: No acute distress.  Patient appears well-groomed.   Head:  Normocephalic/atraumatic Eyes:  Fundi examined but not visualized Neck: supple, no paraspinal tenderness, full range of motion Heart:  Regular rate and rhythm Lungs:  Clear to auscultation bilaterally Back: No paraspinal tenderness Neurological Exam: alert and oriented to person, place, and time. Attention span and concentration intact, recent and remote memory intact, fund of knowledge intact.  Speech fluent and not dysarthric, language intact.  CN II-XII intact. Bulk and tone normal, muscle strength 5/5 throughout.  Sensation to light touch, temperature and vibration intact.  Deep tendon reflexes 2+ throughout, toes downgoing.  Finger to nose and heel to shin testing intact.  Gait normal, Romberg negative.  IMPRESSION: Migraine with aura, without status migrainosus, not intractable Hemiplegic migraine, not intractable  Migraines are significantly improved.    I would like her to continue Cambia since it is effective, she has contraindications to triptans and has failed other  NSAIDs  PLAN: 1.  For preventative management, continue Emgality monthly and topiramate 100mg  at bedtime.  Next visit, if headaches still controlled, will start tapering off of topiramate. 2.  For abortive therapy, Cambia 3.  Limit use of pain relievers to no more than 2 days out of week to prevent risk of rebound or medication-overuse headache. 4.  Keep headache diary 5.  Exercise, hydration, caffeine cessation, sleep hygiene, monitor for and avoid triggers 6.  Consider:  magnesium citrate 400mg  daily, riboflavin 400mg  daily, and coenzyme Q10 100mg  three times daily 7.  Follow up in 4 months.   Metta Clines, DO  CC: Delman Cheadle, MD

## 2019-02-17 ENCOUNTER — Encounter: Payer: Self-pay | Admitting: Neurology

## 2019-02-17 ENCOUNTER — Ambulatory Visit: Payer: BC Managed Care – PPO | Admitting: Neurology

## 2019-02-17 VITALS — BP 108/76 | HR 91 | Ht 65.0 in | Wt 222.0 lb

## 2019-02-17 DIAGNOSIS — G43109 Migraine with aura, not intractable, without status migrainosus: Secondary | ICD-10-CM | POA: Diagnosis not present

## 2019-02-17 DIAGNOSIS — G43409 Hemiplegic migraine, not intractable, without status migrainosus: Secondary | ICD-10-CM | POA: Diagnosis not present

## 2019-02-17 MED ORDER — DICLOFENAC POTASSIUM(MIGRAINE) 50 MG PO PACK: 50.0000 mg | PACK | Freq: Once | ORAL | 3 refills | Status: AC

## 2019-02-17 MED ORDER — DICLOFENAC POTASSIUM(MIGRAINE) 50 MG PO PACK: 50.0000 mg | PACK | Freq: Once | ORAL | 0 refills | Status: AC

## 2019-02-17 NOTE — Patient Instructions (Signed)
1.  Continue emgality once a month and topiramate 100mg  at bedtime.  If headaches still controlled next visit, then we will start tapering off of topiramate. 2.  Use Cambia at earliest onset of migraine.  1 dose in 24 hours. 3.  Limit use of pain relievers to no more than 2 days out of week to prevent risk of rebound or medication-overuse headache. 4.  Follow up in 4 months.

## 2019-03-28 ENCOUNTER — Other Ambulatory Visit: Payer: Self-pay

## 2019-03-28 ENCOUNTER — Ambulatory Visit (INDEPENDENT_AMBULATORY_CARE_PROVIDER_SITE_OTHER): Payer: BC Managed Care – PPO | Admitting: *Deleted

## 2019-03-28 DIAGNOSIS — I429 Cardiomyopathy, unspecified: Secondary | ICD-10-CM

## 2019-03-28 DIAGNOSIS — I472 Ventricular tachycardia, unspecified: Secondary | ICD-10-CM

## 2019-03-28 LAB — CUP PACEART REMOTE DEVICE CHECK
Battery Remaining Longevity: 120 mo
Battery Voltage: 3.01 V
Brady Statistic AP VP Percent: 0 %
Brady Statistic AP VS Percent: 0.02 %
Brady Statistic AS VP Percent: 0.03 %
Brady Statistic AS VS Percent: 99.94 %
Brady Statistic RA Percent Paced: 0.03 %
Brady Statistic RV Percent Paced: 0.04 %
Date Time Interrogation Session: 20200402213640
HighPow Impedance: 47 Ohm
HighPow Impedance: 63 Ohm
Implantable Lead Implant Date: 20120308
Implantable Lead Implant Date: 20120308
Implantable Lead Location: 753859
Implantable Lead Location: 753860
Implantable Lead Model: 5076
Implantable Lead Model: 6947
Implantable Pulse Generator Implant Date: 20190913
Lead Channel Impedance Value: 285 Ohm
Lead Channel Impedance Value: 399 Ohm
Lead Channel Impedance Value: 456 Ohm
Lead Channel Pacing Threshold Amplitude: 0.375 V
Lead Channel Pacing Threshold Amplitude: 1 V
Lead Channel Pacing Threshold Pulse Width: 0.4 ms
Lead Channel Pacing Threshold Pulse Width: 0.4 ms
Lead Channel Sensing Intrinsic Amplitude: 2.625 mV
Lead Channel Sensing Intrinsic Amplitude: 2.625 mV
Lead Channel Sensing Intrinsic Amplitude: 4.125 mV
Lead Channel Sensing Intrinsic Amplitude: 4.125 mV
Lead Channel Setting Pacing Amplitude: 1.5 V
Lead Channel Setting Pacing Amplitude: 2 V
Lead Channel Setting Pacing Pulse Width: 0.4 ms
Lead Channel Setting Sensing Sensitivity: 0.45 mV

## 2019-04-03 NOTE — Progress Notes (Signed)
Remote ICD transmission.   

## 2019-04-07 IMAGING — CT CT RENAL STONE PROTOCOL
2 of 4 series · 15 of 46 positions shown, 17 images · non-contrast
Comparison: April 10, 2016

CLINICAL DATA: Abdominal/back pain and hematuria. History of
sarcoidosis.

EXAM:
CT ABDOMEN AND PELVIS WITHOUT CONTRAST
TECHNIQUE: Multidetector CT imaging of the abdomen and pelvis was performed
following the standard protocol without oral or IV contrast.

[Series 2: axial st · axial · 0.90mm/px · z∈[-470,-35]mm · 12 of 99 slices shown, 14 images]
[im 6/99  soft-tissue]
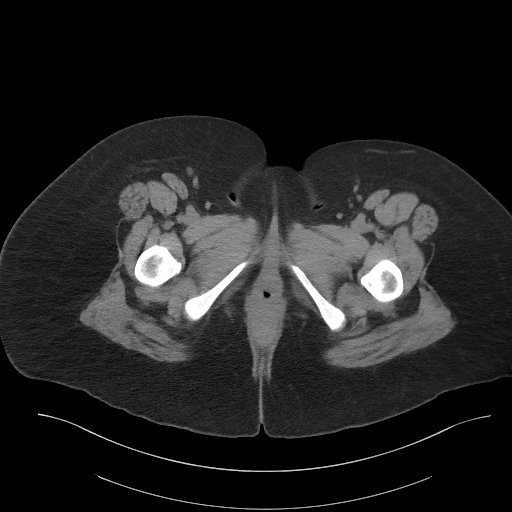
[im 6/99  bone]
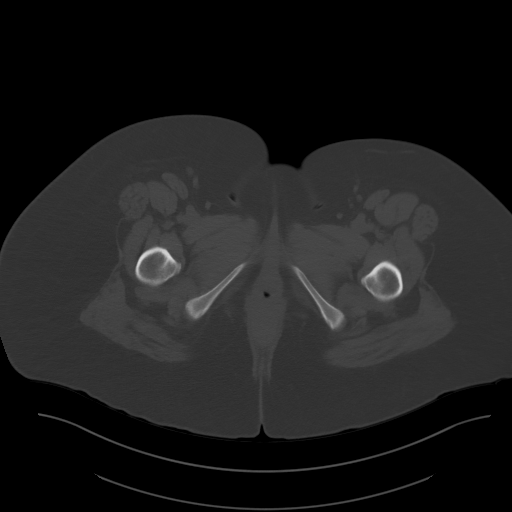
[im 18/99  soft-tissue]
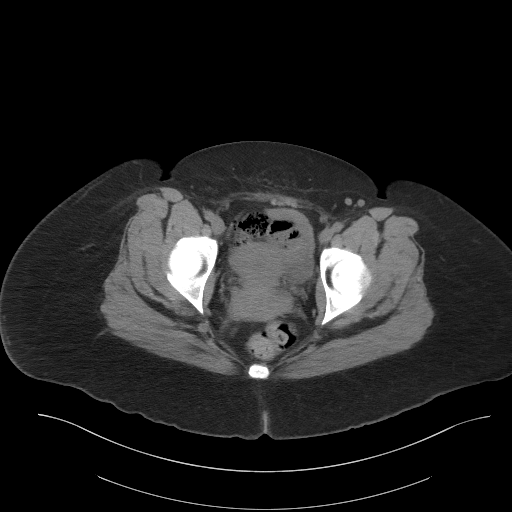
[im 24/99  soft-tissue]
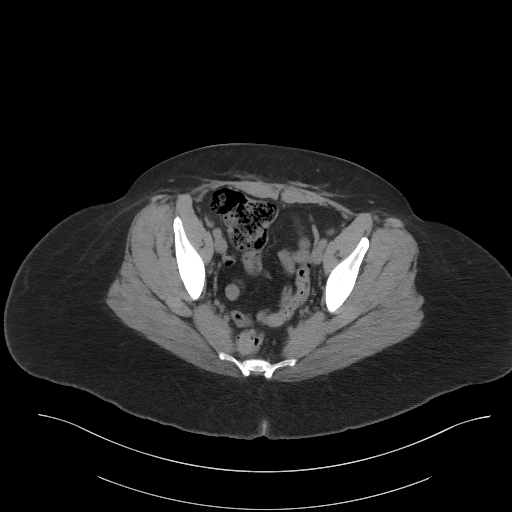
[im 29/99  soft-tissue]
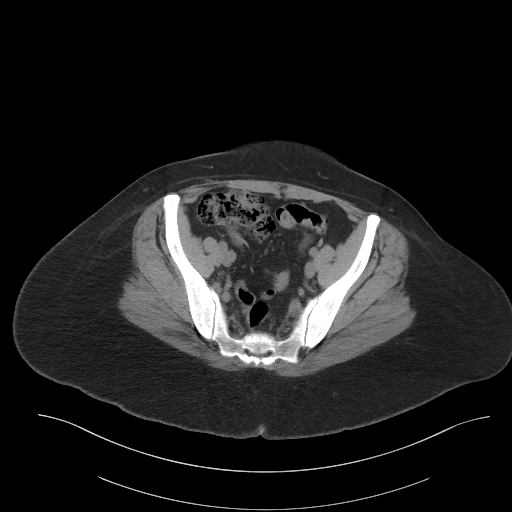
[im 41/99  soft-tissue]
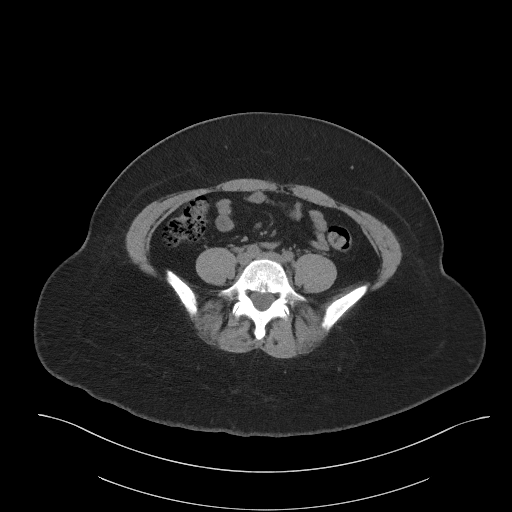
[im 47/99  soft-tissue]
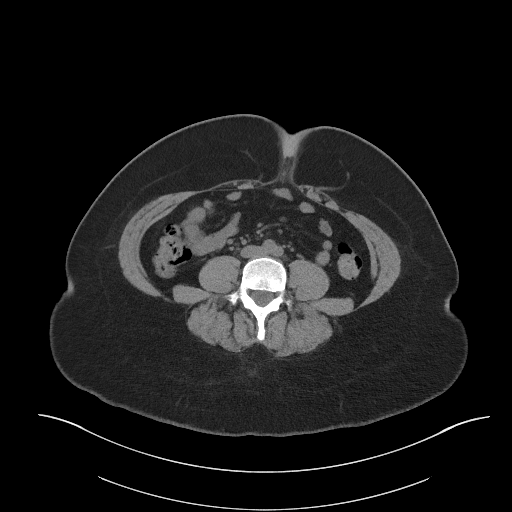
[im 52/99  soft-tissue]
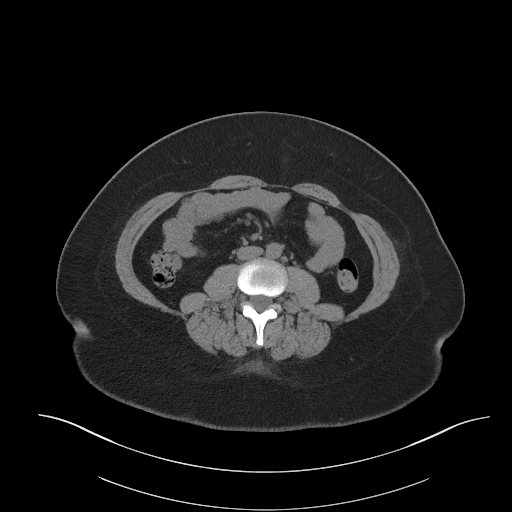
[im 64/99  soft-tissue]
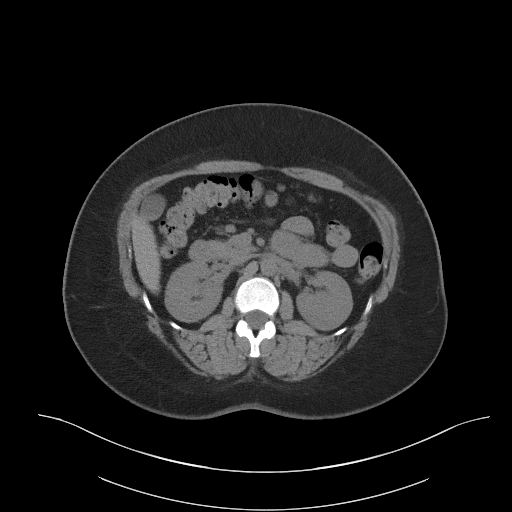
[im 70/99  soft-tissue]
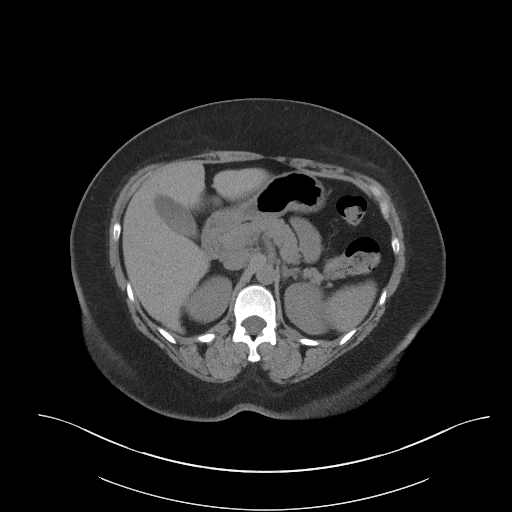
[im 70/99  bone]
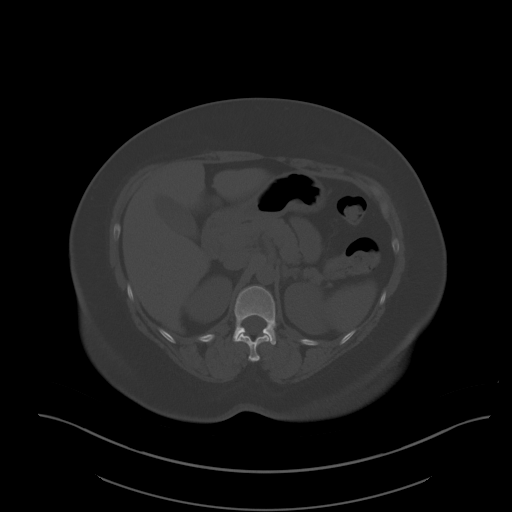
[im 75/99  soft-tissue]
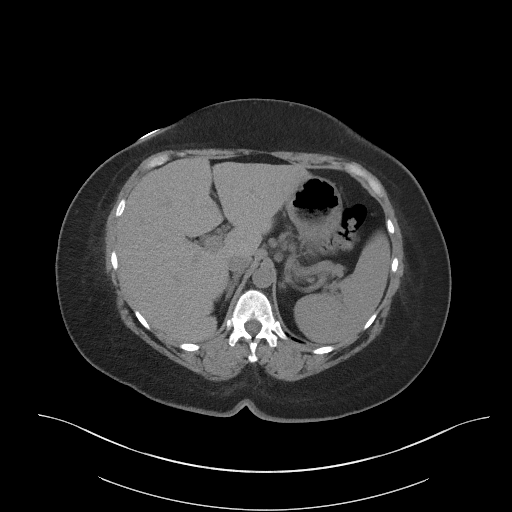
[im 87/99  soft-tissue]
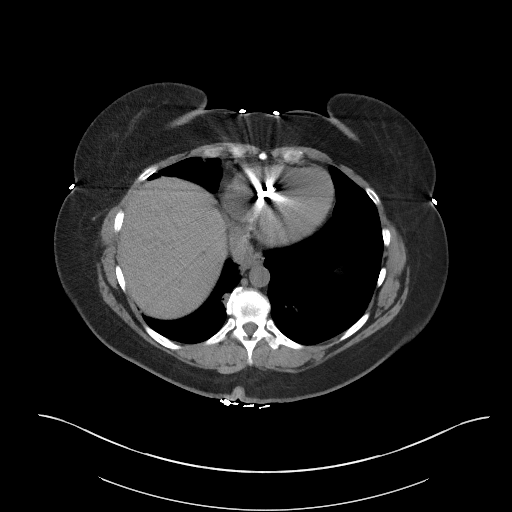
[im 93/99  soft-tissue]
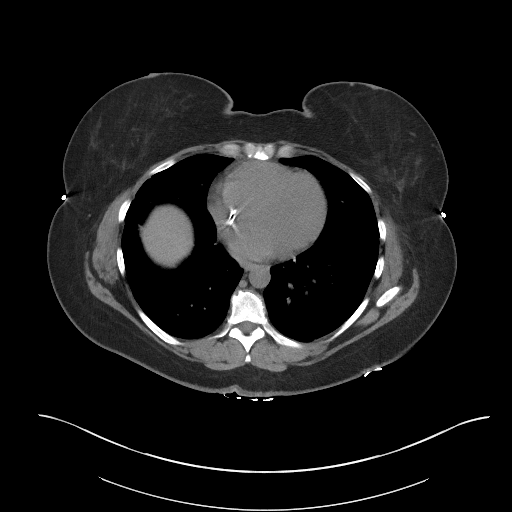

[Series 5: coronal · coronal · 0.83mm/px · 3 of 165 slices shown]
[im 55/165  soft-tissue]
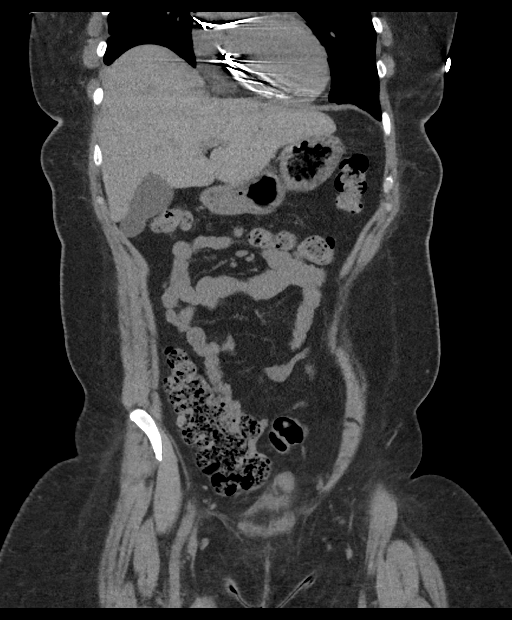
[im 73/165  soft-tissue]
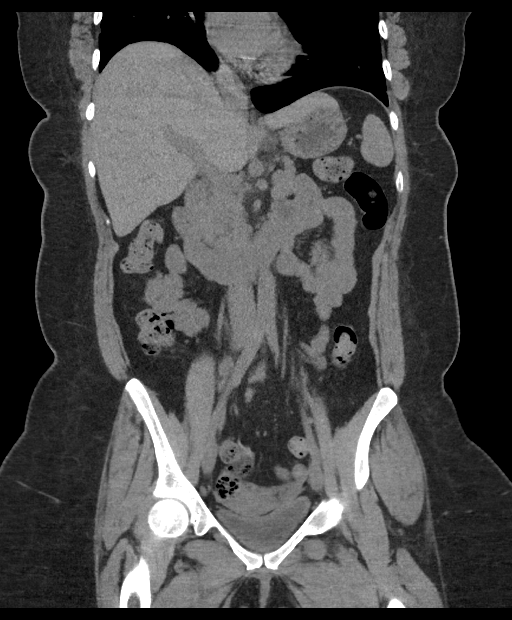
[im 92/165  soft-tissue]
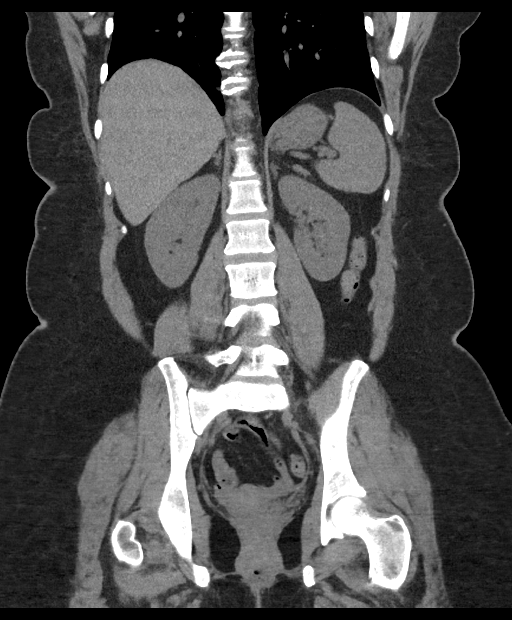

[15 of 46 positions shown; findings below may reference images not displayed]

FINDINGS: Lower chest: There are areas of patchy atelectasis in both lower
lung zones. Previously noted nodular opacities in the lung bases
have overall become smaller and less apparent. Changes which remain
in the lung bases are likely due to residual from known sarcoidosis.

Hepatobiliary: No focal liver lesions are evident on this
noncontrast enhanced study. Gallbladder wall is not appreciably
thickened. There is no biliary duct dilatation.

Pancreas: There is no pancreatic mass or inflammatory focus.

Spleen: No splenic lesions are evident. There is a small accessory
spleen located between the stomach and spleen.

Adrenals/Urinary Tract: Adrenals bilaterally appear normal. There is
a 6 mm apparent cyst arising from the posterior lower pole left
kidney. No hydronephrosis evident on either side. There is no
appreciable renal or ureteral calculus on either side. Urinary
bladder is midline with wall thickness within normal limits.

Stomach/Bowel: There is no appreciable bowel wall or mesenteric
thickening. No evident bowel obstruction. There is no free air or
portal venous air. There is moderate stool throughout the colon.

Vascular/Lymphatic: No abdominal aortic aneurysm. No vascular
lesions are appreciable. There is no appreciable adenopathy in the
abdomen or pelvis. There are subcentimeter inguinal lymph nodes
which are regarded as nonspecific. There are also occasional
subcentimeter mesenteric lymph nodes which are regarded as
nonspecific.

Reproductive: Uterus is anteverted. There is no evident pelvic mass.

Other: Appendix appears unremarkable. There is no abscess or ascites
in the abdomen or pelvis. There is a small ventral hernia containing
only fat.

Musculoskeletal: There is a bone island in the posterior left
acetabulum. No other sclerotic lesions evident. No lytic or
destructive bone lesions. No intramuscular or abdominal wall lesions
are evident.
IMPRESSION: 1. No evident renal or ureteral calculus. No hydronephrosis is
appreciable on either side.

2. Nodular lesions noted previously in the lung bases are less
apparent currently. The somewhat nodular opacities which remain
likely are due to previous sarcoidosis.

3. Scattered subcentimeter lymph nodes in the inguinal regions and
mesentery may represent residua of sarcoidosis. These lymph nodes
are considered nonspecific. Early mesenteric adenitis potentially
could present in this manner.

4. No bowel obstruction. No abscess. Appendix region appears normal.

## 2019-06-19 ENCOUNTER — Encounter: Payer: Self-pay | Admitting: Neurology

## 2019-06-19 NOTE — Progress Notes (Signed)
Virtual Visit via Video Note The purpose of this virtual visit is to provide medical care while limiting exposure to the novel coronavirus.    Consent was obtained for video visit:  Yes.   Answered questions that patient had about telehealth interaction:  Yes.   I discussed the limitations, risks, security and privacy concerns of performing an evaluation and management service by telemedicine. I also discussed with the patient that there may be a patient responsible charge related to this service. The patient expressed understanding and agreed to proceed.  Pt location: Home Physician Location: office Name of referring provider:  Shawnee Knapp, MD I connected with Alexandra Hood at patients initiation/request on 06/20/2019 at  9:30 AM EDT by video enabled telemedicine application and verified that I am speaking with the correct person using two identifiers. Pt MRN:  [id number] Pt DOB:  07-Sep-1971 Video Participants:  Alexandra Hood   History of Present Illness:  Alexandra Hood is a 48 year old African-American woman with sarcoidosis, ventricular tachycardia status post defibrillator, and history of idiopathic intracranial hypertension who follows up for migraines.  UPDATE: She was initially started on Aimovig but subsequently wished to discontinue it because she thought it contributed to her hair loss.  Instead, she was started on Emgality.   Topiramate was increased to 150 mg at bedtime but affected her cognition so she went back to 100mg .  Has not been taking Cambia because she thought the [place] was in place of [place].  Over past 4 months, she had 2 severe migraines each lasting a week. Frequency of abortive medication:2 days in past 30 days. Current NSAIDS:Cambia (not effective). Current analgesics:Tylenol Current triptans:Contraindicated (ventricular tachycardia and stroke-like symptoms) Current ergotamine:no Current anti-emetic:no Current muscle  relaxants:no Current anti-anxiolytic:no Current sleep aide:no Current Antihypertensive medications:no Current Antidepressant medications:no Current Anticonvulsant medications:topiramate 100mg  at bedtime (150mg  caused cognitive deficits) Current anti-CGRP: Emgality Current Vitamins/Herbal/Supplements:no Current Antihistamines/Decongestants:no Other therapy:no Hormone/Birth control:No  Caffeine:no Alcohol:no Smoker:no Diet:hydrates Exercise:walks Depression:no; Anxiety:no Other pain:Pain related to sarcoidosis Sleep hygiene:okay  HISTORY: She was diagnosed with pseudotumor cerebri in January 2009. 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 She underwent lumbar puncture on 01/24/08 which demonstrated normal opening pressure of 14 cm H2O with CSF glucose 68, protein 23 and 0 cell count. She apparently had an MRI and MRV of the head performed, which were reportedly normal. She was on acetazolamide for awhile, which was subsequently discontinued due to resolution of papilledema. She sees the ophthalmologist annually. Those headaches were dull right sided posterior headaches. She had a repeat eye exam with Dr. Katy Hood in November 2019, which showed mild blurring of the nasal disc margin but nothing dramatic.   She began experiencing migraines around age 89. They are right frontal/temporal, non-throbbing and severe. They are associated with nausea, photophobia, phonophobia, osmophobia, but not vomiting. She sometimes sees white spots in her vision and has associated right upper and lower extremity numbness and weakness. They typically last 2 days and occur 2 to 3 times a monthbut have increased in frequency. No known triggers.  Relieved by rest.  Past NSAIDS: naproxen 550mg  (drowsiness), Advil Migraine, Sprix NS (ineffective and caused burning) Past analgesics: Tylenol, Excedrin Past abortive triptans:  no Past muscle relaxants: no Past anti-emetic: Zofran ODT 4mg  Past antihypertensive medications:Toprol XL 25mg  Past antidepressant medications: no Past anticonvulsant medications: acetazolamide Past anti--CGRP: Aimovig (possible hair loss) Past vitamins/Herbal/Supplements: no Past antihistamines/decongestants: no Other past therapies: no  Family history of headache: mother  Past Medical  History: Past Medical History:  Diagnosis Date  . Headache   . Lupus pernio    -skin bx confirmed at [facility] 05/07/2009 (personally reviewed) -looks improved 07/09/2009 -  . Pertussis   . Pseudotumor cerebri    .Marland KitchenMarland KitchenDr Alexandra Hood and Dr. Katy Hood - dxed Jan 2009, on diamox  - LP 01/24/2008 at [facility] - Normal ( pressure 14cm, gluc 68, Protein 23, Clear, colorless, RBC 3, WBC - too few to count)  - CT head non contrast done at [facility] 11/25/2007: "normal". Ddoes not report MRI, MRA, MRV being done - 2nd opinon by Dr. Leonie Hood - reportdly had MRI/MRV both normal; followup pending  . Pulmonary sarcoidosis ([place])    - Spiro 07/25/2010 on pred 10/mtx 10 -> FEv1 1.84L/775%, FVC 2.25L - SPiro 09/06/2010 -> Fev1 2.14L/81%. Cut pred to 5mg  per day. Cont mtx - SPiro 01/20/2011 - Fev1 1.6L/61%, Ratio 74%. CXR Worse -> increased pred to 10mg  perday. Cont Mtx.    . Ventricular tachycardia ([place])   . Vitamin D deficiency    - Aug 2011: 22 and low -> commence Rx. - Jan 2012: 11    Medications: Outpatient Encounter Medications as of 06/20/2019  Medication Sig  . Galcanezumab-gnlm (EMGALITY) 120 MG/ML SOSY Inject 120 mg into the skin every 30 (thirty) days.  Marland Kitchen ibuprofen (ADVIL,MOTRIN) 200 MG tablet Take 200-600 mg by mouth daily as needed for headache or moderate pain.  . Multiple Vitamin (MULTIVITAMIN WITH MINERALS) TABS tablet Take 1 tablet by mouth daily.  Marland Kitchen topiramate (TOPAMAX) 100 MG tablet Take 1 tablet (100 mg total) by mouth at bedtime.   No facility-administered encounter medications on file as  of 06/20/2019.     Allergies: No Known Allergies  Family History: Family History  Problem Relation Age of Onset  . Allergies Mother   . Diabetes Mother   . Hypertension Mother   . Hyperlipidemia Mother   . Migraines Mother   . Hyperlipidemia Father   . Hypertension Father   . Diabetes Father   . Hyperlipidemia Sister   . Hypertension Sister   . Meniere's disease Sister   . COPD Maternal Grandfather   . Leukemia Paternal Grandmother   . Heart attack Paternal Grandfather     Social History: Social History   Socioeconomic History  . Marital status: Legally Separated    Spouse name: Not on file  . Number of children: 1  . Years of education: Not on file  . Highest education level: Master's degree (e.g., MA, MS, MEng, MEd, MSW, MBA)  Occupational History  . Occupation: school Information systems manager)  Social Needs  . Financial resource strain: Not on file  . Food insecurity    Worry: Not on file    Inability: Not on file  . Transportation needs    Medical: Not on file    Non-medical: Not on file  Tobacco Use  . Smoking status: Former Smoker    Packs/day: 1.00    Years: 15.00    Pack years: 15.00    Types: Cigarettes    Quit date: 03/03/2007    Years since quitting: 12.3  . Smokeless tobacco: Never Used  Substance and Sexual Activity  . Alcohol use: No    Alcohol/week: 0.0 standard drinks  . Drug use: No  . Sexual activity: Not Currently    Comment: bilateral oophorectomy  Lifestyle  . Physical activity    Days per week: Not on file    Minutes per session: Not on file  .  Stress: Not on file  Relationships  . Social Herbalist on phone: Not on file    Gets together: Not on file    Attends religious service: Not on file    Active member of club or organization: Not on file    Attends meetings of clubs or organizations: Not on file    Relationship status: Not on file  . Intimate partner violence    Fear of current or ex partner: Not on file     Emotionally abused: Not on file    Physically abused: Not on file    Forced sexual activity: Not on file  Other Topics Concern  . 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 She is separated, has one daughter. Lives in 2 story house, master on 1st. Drinks an occasional cup of coffee, 2 glasses of tea a day. Walks her dog daily.      Observations/Objective:   Height 5\' 5"  (1.651 m), weight 215 lb (97.5 kg), last menstrual period 12/25/2006. Alert and oriented.  Speech fluent and not dysarthric.  Language intact.  Face symmetric.    Assessment and Plan:   1.  Migraine with aura, status migrainosus, not intractable.  Ongoing headache for the entire week prior to next Emgality injection.  Unfortunately, we are not allowed to prescribe Emgality every 3 weeks.  Instead, I will prescribe her Nurtec as a mini-prophylaxis where she takes 1 pill daily for 8 straight days prior to next Emgality injection. 2.  Hemiplegic migraine, not intractable, stable 3.  Idiopathic intracranial hypertension, stable  1.  For preventative management, Emgality monthly and topiramate 100mg  daily 2. Nurtec daily for 8 days prior to [facility] injection as mini-prophylaxis  3. For abortive therapy, Cambia 4.  Limit use of pain relievers to no more than 2 days out of week to prevent risk of rebound or medication-overuse headache. 5.  Keep headache diary 6.  Exercise, hydration, caffeine cessation, sleep hygiene, monitor for and avoid triggers 7.  Consider:  magnesium citrate 400mg  daily, riboflavin 400mg  daily, and coenzyme Q10 100mg  three times daily 8. Always keep in mind that currently taking a hormone or birth control may be a possible trigger or aggravating factor for migraine. 9. Follow up 4 months  Follow Up Instructions:    -I discussed the assessment and treatment plan with the  patient. The patient was provided an opportunity to ask questions and all were answered. The patient agreed with the plan and demonstrated an understanding of the instructions.   The patient was advised to call back or seek an in-person evaluation if the symptoms worsen or if the condition fails to improve as anticipated.   Dudley Major, DO

## 2019-06-20 ENCOUNTER — Other Ambulatory Visit: Payer: Self-pay

## 2019-06-20 ENCOUNTER — Encounter: Payer: Self-pay | Admitting: Neurology

## 2019-06-20 ENCOUNTER — Telehealth (INDEPENDENT_AMBULATORY_CARE_PROVIDER_SITE_OTHER): Payer: BC Managed Care – PPO | Admitting: Neurology

## 2019-06-20 VITALS — Ht 65.0 in | Wt 215.0 lb

## 2019-06-20 DIAGNOSIS — G43611 Persistent migraine aura with cerebral infarction, intractable, with status migrainosus: Secondary | ICD-10-CM

## 2019-06-20 DIAGNOSIS — I639 Cerebral infarction, unspecified: Secondary | ICD-10-CM | POA: Diagnosis not present

## 2019-06-20 DIAGNOSIS — G43409 Hemiplegic migraine, not intractable, without status migrainosus: Secondary | ICD-10-CM

## 2019-06-20 DIAGNOSIS — G932 Benign intracranial hypertension: Secondary | ICD-10-CM

## 2019-06-20 MED ORDER — NURTEC 75 MG PO TBDP: 75.0000 mg | ORAL_TABLET | Freq: Every day | ORAL | 6 refills | Status: DC | PRN

## 2019-06-20 MED ORDER — CAMBIA 50 MG PO PACK: 50.0000 mg | PACK | ORAL | 6 refills | Status: DC

## 2019-06-20 NOTE — Addendum Note (Signed)
Addended by: Clois Comber on: 06/20/2019 10:33 AM   Modules accepted: Orders

## 2019-06-20 NOTE — Patient Instructions (Signed)
1.  Continue Emgality and topiramate. 2.  I will prescribe you Nurtec.  Take 1 pill daily for 8 straight days prior to next Emgality injection 3.  Cambia for breakthrough headache 4.  Follow up in 4 months

## 2019-06-20 NOTE — Progress Notes (Signed)
Called and spoke with Pt. She gave me permission to text pictures of Nurtec and Cambia copay cards.  Nurtec BIN: [id number] PCN: [id number] GRP: [id number] ID# [id number]  Jeannine Boga: [id number] PCN: [id number] REP GROUP: [id number] MEMBER ID: [id number]

## 2019-06-23 ENCOUNTER — Ambulatory Visit: Payer: BC Managed Care – PPO | Admitting: Neurology

## 2019-06-26 ENCOUNTER — Ambulatory Visit (INDEPENDENT_AMBULATORY_CARE_PROVIDER_SITE_OTHER): Payer: BC Managed Care – PPO | Admitting: *Deleted

## 2019-06-26 DIAGNOSIS — I472 Ventricular tachycardia, unspecified: Secondary | ICD-10-CM

## 2019-06-26 LAB — CUP PACEART REMOTE DEVICE CHECK
Battery Remaining Longevity: 118 mo
Battery Voltage: 3.01 V
Brady Statistic AP VP Percent: 0 %
Brady Statistic AP VS Percent: 0.04 %
Brady Statistic AS VP Percent: 0.03 %
Brady Statistic AS VS Percent: 99.93 %
Brady Statistic RA Percent Paced: 0.04 %
Brady Statistic RV Percent Paced: 0.03 %
Date Time Interrogation Session: 20200702064735
HighPow Impedance: 45 Ohm
HighPow Impedance: 54 Ohm
Implantable Lead Implant Date: 20120308
Implantable Lead Implant Date: 20120308
Implantable Lead Location: 753859
Implantable Lead Location: 753860
Implantable Lead Model: 5076
Implantable Lead Model: 6947
Implantable Pulse Generator Implant Date: 20190913
Lead Channel Impedance Value: 285 Ohm
Lead Channel Impedance Value: 399 Ohm
Lead Channel Impedance Value: 437 Ohm
Lead Channel Pacing Threshold Amplitude: 0.5 V
Lead Channel Pacing Threshold Amplitude: 0.75 V
Lead Channel Pacing Threshold Pulse Width: 0.4 ms
Lead Channel Pacing Threshold Pulse Width: 0.4 ms
Lead Channel Sensing Intrinsic Amplitude: 2.125 mV
Lead Channel Sensing Intrinsic Amplitude: 5.75 mV
Lead Channel Setting Pacing Amplitude: 1.5 V
Lead Channel Setting Pacing Amplitude: 2 V
Lead Channel Setting Pacing Pulse Width: 0.4 ms
Lead Channel Setting Sensing Sensitivity: 0.45 mV

## 2019-07-03 ENCOUNTER — Encounter: Payer: Self-pay | Admitting: Cardiology

## 2019-07-03 NOTE — Progress Notes (Signed)
Remote ICD transmission.   

## 2019-07-16 ENCOUNTER — Other Ambulatory Visit: Payer: Self-pay

## 2019-07-16 NOTE — Progress Notes (Signed)
Cambia 50mg  prior authorization submitted via covermymeds.  Waiting response.  Key [id number]

## 2019-07-16 NOTE — Progress Notes (Signed)
Nurtec 75mg  prior authorization submitted via covermymeds.  Waiting response. KEY [id number]

## 2019-09-20 ENCOUNTER — Other Ambulatory Visit: Payer: Self-pay | Admitting: Neurology

## 2019-09-25 ENCOUNTER — Ambulatory Visit (INDEPENDENT_AMBULATORY_CARE_PROVIDER_SITE_OTHER): Payer: BC Managed Care – PPO | Admitting: *Deleted

## 2019-09-25 DIAGNOSIS — I472 Ventricular tachycardia, unspecified: Secondary | ICD-10-CM

## 2019-09-25 DIAGNOSIS — I429 Cardiomyopathy, unspecified: Secondary | ICD-10-CM | POA: Diagnosis not present

## 2019-09-25 LAB — CUP PACEART REMOTE DEVICE CHECK
Battery Remaining Longevity: 115 mo
Battery Voltage: 3 V
Brady Statistic AP VP Percent: 0 %
Brady Statistic AP VS Percent: 0.02 %
Brady Statistic AS VP Percent: 0.03 %
Brady Statistic AS VS Percent: 99.95 %
Brady Statistic RA Percent Paced: 0.02 %
Brady Statistic RV Percent Paced: 0.04 %
Date Time Interrogation Session: 20201001062823
HighPow Impedance: 46 Ohm
HighPow Impedance: 58 Ohm
Implantable Lead Implant Date: 20120308
Implantable Lead Implant Date: 20120308
Implantable Lead Location: 753859
Implantable Lead Location: 753860
Implantable Lead Model: 5076
Implantable Lead Model: 6947
Implantable Pulse Generator Implant Date: 20190913
Lead Channel Impedance Value: 285 Ohm
Lead Channel Impedance Value: 399 Ohm
Lead Channel Impedance Value: 456 Ohm
Lead Channel Pacing Threshold Amplitude: 0.5 V
Lead Channel Pacing Threshold Amplitude: 0.625 V
Lead Channel Pacing Threshold Pulse Width: 0.4 ms
Lead Channel Pacing Threshold Pulse Width: 0.4 ms
Lead Channel Sensing Intrinsic Amplitude: 2.5 mV
Lead Channel Sensing Intrinsic Amplitude: 4.5 mV
Lead Channel Setting Pacing Amplitude: 1.5 V
Lead Channel Setting Pacing Amplitude: 2 V
Lead Channel Setting Pacing Pulse Width: 0.4 ms
Lead Channel Setting Sensing Sensitivity: 0.45 mV

## 2019-10-01 ENCOUNTER — Encounter: Payer: Self-pay | Admitting: Cardiology

## 2019-10-01 NOTE — Progress Notes (Signed)
Remote ICD transmission.   

## 2019-10-03 ENCOUNTER — Telehealth: Payer: Self-pay | Admitting: Internal Medicine

## 2019-10-03 NOTE — Telephone Encounter (Signed)
Disability parking application received. Placed in box for Dr. Caryl Comes to sign.

## 2019-10-08 NOTE — Telephone Encounter (Signed)
Placard completed and left downstairs for pick up.  Pt notified.

## 2019-11-05 ENCOUNTER — Telehealth: Payer: Self-pay | Admitting: Neurology

## 2019-11-05 DIAGNOSIS — G43409 Hemiplegic migraine, not intractable, without status migrainosus: Secondary | ICD-10-CM

## 2019-11-05 MED ORDER — EMGALITY 120 MG/ML ~~LOC~~ SOSY: 120.0000 mg | PREFILLED_SYRINGE | SUBCUTANEOUS | 11 refills | Status: DC

## 2019-11-05 NOTE — Telephone Encounter (Signed)
Requested Prescriptions   Pending Prescriptions Disp Refills  . Galcanezumab-gnlm (EMGALITY) 120 MG/ML SOSY 1 mL 11    Sig: Inject 120 mg into the skin every 30 (thirty) days.   Rx last filled:11/08/18 #1 11 REFILLS  Pt last seen: 06/20/19  Follow up appt scheduled:NONE

## 2019-11-05 NOTE — Telephone Encounter (Signed)
Rx sent 

## 2019-11-05 NOTE — Telephone Encounter (Signed)
Patient called Alexandra Hood needing to get her Emgality filled and they told her there was know refills left. She was seen this year for a follow up with Dr. Tomi Likens. She uses Alexandra Hood on [address]. Thank you

## 2019-11-06 NOTE — Progress Notes (Signed)
Received fax from [place] 07/16/19-07/15/20  [place] TO BE SCAN

## 2019-11-06 NOTE — Progress Notes (Signed)
Received fax from [place] DENIED Pt needs to have tried and failed Migraine-eletriptan, ibuprofen, naratriptan, rizatriptan, sumatriptan, zolmitriptan, Onzetra Xsail, Zembrace, Symtouch, Zomig nasal spray  Pt must have trail and failure of 3 or more in class with at least 3 alternative, 2 in class with 2 alternative, or 1 in class with only 1 alternative.  [phone]

## 2019-12-25 ENCOUNTER — Ambulatory Visit (INDEPENDENT_AMBULATORY_CARE_PROVIDER_SITE_OTHER): Payer: BC Managed Care – PPO | Admitting: *Deleted

## 2019-12-25 DIAGNOSIS — I429 Cardiomyopathy, unspecified: Secondary | ICD-10-CM

## 2019-12-25 LAB — CUP PACEART REMOTE DEVICE CHECK
Battery Remaining Longevity: 112 mo
Battery Voltage: 2.99 V
Brady Statistic AP VP Percent: 0 %
Brady Statistic AP VS Percent: 0.02 %
Brady Statistic AS VP Percent: 0.04 %
Brady Statistic AS VS Percent: 99.95 %
Brady Statistic RA Percent Paced: 0.02 %
Brady Statistic RV Percent Paced: 0.04 %
Date Time Interrogation Session: 20201231012403
HighPow Impedance: 48 Ohm
HighPow Impedance: 61 Ohm
Implantable Lead Implant Date: 20120308
Implantable Lead Implant Date: 20120308
Implantable Lead Location: 753859
Implantable Lead Location: 753860
Implantable Lead Model: 5076
Implantable Lead Model: 6947
Implantable Pulse Generator Implant Date: 20190913
Lead Channel Impedance Value: 323 Ohm
Lead Channel Impedance Value: 399 Ohm
Lead Channel Impedance Value: 456 Ohm
Lead Channel Pacing Threshold Amplitude: 0.5 V
Lead Channel Pacing Threshold Amplitude: 0.875 V
Lead Channel Pacing Threshold Pulse Width: 0.4 ms
Lead Channel Pacing Threshold Pulse Width: 0.4 ms
Lead Channel Sensing Intrinsic Amplitude: 2.5 mV
Lead Channel Sensing Intrinsic Amplitude: 2.5 mV
Lead Channel Sensing Intrinsic Amplitude: 4.5 mV
Lead Channel Sensing Intrinsic Amplitude: 4.5 mV
Lead Channel Setting Pacing Amplitude: 1.5 V
Lead Channel Setting Pacing Amplitude: 2 V
Lead Channel Setting Pacing Pulse Width: 0.4 ms
Lead Channel Setting Sensing Sensitivity: 0.45 mV

## 2020-03-12 ENCOUNTER — Telehealth (INDEPENDENT_AMBULATORY_CARE_PROVIDER_SITE_OTHER): Payer: BC Managed Care – PPO | Admitting: Adult Health Nurse Practitioner

## 2020-03-12 ENCOUNTER — Encounter: Payer: Self-pay | Admitting: Adult Health Nurse Practitioner

## 2020-03-12 ENCOUNTER — Emergency Department (HOSPITAL_BASED_OUTPATIENT_CLINIC_OR_DEPARTMENT_OTHER): Payer: BC Managed Care – PPO

## 2020-03-12 ENCOUNTER — Encounter (HOSPITAL_BASED_OUTPATIENT_CLINIC_OR_DEPARTMENT_OTHER): Payer: Self-pay | Admitting: *Deleted

## 2020-03-12 ENCOUNTER — Other Ambulatory Visit: Payer: Self-pay

## 2020-03-12 ENCOUNTER — Emergency Department (HOSPITAL_BASED_OUTPATIENT_CLINIC_OR_DEPARTMENT_OTHER)
Admission: EM | Admit: 2020-03-12 | Discharge: 2020-03-12 | Disposition: A | Discharge status: Home / Self Care | Payer: BC Managed Care – PPO | Attending: Emergency Medicine | Admitting: Emergency Medicine

## 2020-03-12 DIAGNOSIS — Z79899 Other long term (current) drug therapy: Secondary | ICD-10-CM | POA: Insufficient documentation

## 2020-03-12 DIAGNOSIS — R1031 Right lower quadrant pain: Secondary | ICD-10-CM

## 2020-03-12 DIAGNOSIS — Z87891 Personal history of nicotine dependence: Secondary | ICD-10-CM | POA: Insufficient documentation

## 2020-03-12 DIAGNOSIS — R109 Unspecified abdominal pain: Secondary | ICD-10-CM | POA: Diagnosis present

## 2020-03-12 LAB — CBC WITH DIFFERENTIAL/PLATELET
Abs Immature Granulocytes: 0.03 10*3/uL (ref 0.00–0.07)
Basophils Absolute: 0 10*3/uL (ref 0.0–0.1)
Basophils Relative: 0 %
Eosinophils Absolute: 0.2 10*3/uL (ref 0.0–0.5)
Eosinophils Relative: 1 %
HCT: 40.1 % (ref 36.0–46.0)
Hemoglobin: 12.8 g/dL (ref 12.0–15.0)
Immature Granulocytes: 0 %
Lymphocytes Relative: 25 %
Lymphs Abs: 3.2 10*3/uL (ref 0.7–4.0)
MCH: 28.3 pg (ref 26.0–34.0)
MCHC: 31.9 g/dL (ref 30.0–36.0)
MCV: 88.7 fL (ref 80.0–100.0)
Monocytes Absolute: 0.8 10*3/uL (ref 0.1–1.0)
Monocytes Relative: 6 %
Neutro Abs: 8.6 10*3/uL — ABNORMAL HIGH (ref 1.7–7.7)
Neutrophils Relative %: 68 %
Platelets: 426 10*3/uL — ABNORMAL HIGH (ref 150–400)
RBC: 4.52 MIL/uL (ref 3.87–5.11)
RDW: 14.3 % (ref 11.5–15.5)
WBC: 12.9 10*3/uL — ABNORMAL HIGH (ref 4.0–10.5)
nRBC: 0 % (ref 0.0–0.2)

## 2020-03-12 LAB — URINALYSIS, MICROSCOPIC (REFLEX): WBC, UA: NONE SEEN WBC/hpf (ref 0–5)

## 2020-03-12 LAB — COMPREHENSIVE METABOLIC PANEL
ALT: 20 U/L (ref 0–44)
AST: 15 U/L (ref 15–41)
Albumin: 4.1 g/dL (ref 3.5–5.0)
Alkaline Phosphatase: 94 U/L (ref 38–126)
Anion gap: 8 (ref 5–15)
BUN: 13 mg/dL (ref 6–20)
CO2: 28 mmol/L (ref 22–32)
Calcium: 9.3 mg/dL (ref 8.9–10.3)
Chloride: 103 mmol/L (ref 98–111)
Creatinine, Ser: 0.78 mg/dL (ref 0.44–1.00)
GFR calc Af Amer: >60 mL/min (ref >60)
GFR calc non Af Amer: >60 mL/min (ref >60)
Glucose, Bld: 111 mg/dL — ABNORMAL HIGH (ref 70–99)
Potassium: 4.1 mmol/L (ref 3.5–5.1)
Sodium: 139 mmol/L (ref 135–145)
Total Bilirubin: 0.3 mg/dL (ref 0.3–1.2)
Total Protein: 7.6 g/dL (ref 6.5–8.1)

## 2020-03-12 LAB — URINALYSIS, ROUTINE W REFLEX MICROSCOPIC
Bilirubin Urine: NEGATIVE
Glucose, UA: NEGATIVE mg/dL
Ketones, ur: NEGATIVE mg/dL
Leukocytes,Ua: NEGATIVE
Nitrite: NEGATIVE
Protein, ur: NEGATIVE mg/dL
Specific Gravity, Urine: <1.005 — ABNORMAL LOW (ref 1.005–1.030)
pH: 6.5 (ref 5.0–8.0)

## 2020-03-12 MED ORDER — SODIUM CHLORIDE 0.9 % IV BOLUS (SEPSIS)
1000.0000 mL | Freq: Once | INTRAVENOUS | Status: AC
  Administered 2020-03-12: 1000 mL via INTRAVENOUS

## 2020-03-12 MED ORDER — KETOROLAC TROMETHAMINE 30 MG/ML IJ SOLN
30.0000 mg | Freq: Once | INTRAMUSCULAR | Status: AC
  Administered 2020-03-12: 30 mg via INTRAVENOUS
  Filled 2020-03-12: qty 1

## 2020-03-12 MED ORDER — MORPHINE SULFATE (PF) 4 MG/ML IV SOLN
4.0000 mg | Freq: Once | INTRAVENOUS | Status: AC
  Administered 2020-03-12: 4 mg via INTRAVENOUS
  Filled 2020-03-12: qty 1

## 2020-03-12 MED ORDER — ONDANSETRON HCL 4 MG/2ML IJ SOLN
4.0000 mg | Freq: Once | INTRAMUSCULAR | Status: AC
  Administered 2020-03-12: 4 mg via INTRAVENOUS
  Filled 2020-03-12: qty 2

## 2020-03-12 MED ORDER — IOHEXOL 300 MG/ML  SOLN
100.0000 mL | Freq: Once | INTRAMUSCULAR | Status: AC | PRN
  Administered 2020-03-12: 21:00:00 100 mL via INTRAVENOUS

## 2020-03-12 MED ORDER — SODIUM CHLORIDE 0.9 % IV SOLN: 1000.0000 mL | INTRAVENOUS | Status: DC

## 2020-03-12 MED ORDER — ETODOLAC 300 MG PO CAPS: 300.0000 mg | ORAL_CAPSULE | Freq: Three times a day (TID) | ORAL | 0 refills | Status: DC

## 2020-03-12 NOTE — Patient Instructions (Signed)
° ° ° °  If you have lab work done today you will be contacted with your lab results within the next 2 weeks.  If you have not heard from us then please contact us. The fastest way to get your results is to register for My Chart. ° ° °IF you received an x-ray today, you will receive an invoice from [facility]. Please contact  Radiology at [phone] with questions or concerns regarding your invoice.  ° °IF you received labwork today, you will receive an invoice from [facility]. Please contact [facility] at [phone] with questions or concerns regarding your invoice.  ° °Our billing staff will not be able to assist you with questions regarding bills from these companies. ° °You will be contacted with the lab results as soon as they are available. The fastest way to get your results is to activate your My Chart account. Instructions are located on the last page of this paperwork. If you have not heard from us regarding the results in 2 weeks, please contact this office. °  ° ° ° °

## 2020-03-12 NOTE — ED Provider Notes (Signed)
[place] EMERGENCY DEPARTMENT Provider Note   CSN: [id number] Arrival date & time: 03/12/20  1808     History Chief Complaint  Patient presents with  . Abdominal Pain    Alexandra Hood is a 49 y.o. female.  HPI   Pt presents to the ED for evaluation of abdominal pain.  Started 4 days ago.  INcreasing in severity.  Pain radiates to the back.  No fevers or chills.  No vaginal discharge or bleeding.  Patient states she has had bilateral oophorectomy and fallopian tube removal on one side but still has her uterus.  She has never had surgery on her appendix  Past Medical History:  Diagnosis Date  . Headache   . Lupus pernio    -skin bx confirmed at [facility] 05/07/2009 (personally reviewed) -looks improved 07/09/2009 -  . Pertussis   . Pseudotumor cerebri    .Marland KitchenMarland KitchenDr Sabra Heck and Dr. Katy Fitch - dxed Jan 2009, on diamox  - LP 01/24/2008 at [facility] - Normal ( pressure 14cm, gluc 68, Protein 23, Clear, colorless, RBC 3, WBC - too few to count)  - CT head non contrast done at [facility] 11/25/2007: "normal". Ddoes not report MRI, MRA, MRV being done - 2nd opinon by Dr. Leonie Man - reportdly had MRI/MRV both normal; followup pending  . Pulmonary sarcoidosis ([place])    - Spiro 07/25/2010 on pred 10/mtx 10 -> FEv1 1.84L/775%, FVC 2.25L - SPiro 09/06/2010 -> Fev1 2.14L/81%. Cut pred to 5mg  per day. Cont mtx - SPiro 01/20/2011 - Fev1 1.6L/61%, Ratio 74%. CXR Worse -> increased pred to 10mg  perday. Cont Mtx.    . Ventricular tachycardia ([place])   . Vitamin D deficiency    - Aug 2011: 22 and low -> commence Rx. - Jan 2012: 11    Patient Active Problem List   Diagnosis Date Noted  . Right lower quadrant abdominal pain 03/12/2020  . Alopecia 01/08/2019  . Papilledema of right eye 01/08/2019  . Migraine 01/07/2019  . Gross hematuria 06/15/2018  . Pertussis   . Flank pain 10/23/2016  . Multiple pulmonary nodules 08/24/2014  . Pulmonary sarcoidosis ([place]) 08/24/2014  . Sinus  tachycardia 10/23/2013  . 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 Thrombocytosis ([place]) 03/06/2012  . Obesity 03/06/2012  . Pre-diabetes 03/06/2012  . 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 Keloid--ICD incision 04/25/2011  . Automatic implantable cardioverter-defibrillator in situ 04/18/2011  . DEPRESSION 02/15/2011  . Unspecified vitamin D deficiency 01/20/2011  . VENTRICULAR TACHYCARDIA 01/20/2011  . PSEUDOTUMOR CEREBRI 05/12/2009    Past Surgical History:  Procedure Laterality Date  . CARDIAC DEFIBRILLATOR PLACEMENT  march 2012   [employer]onic  . ESOPHAGOGASTRODUODENOSCOPY (EGD) WITH PROPOFOL N/A 02/25/2016   Procedure: ESOPHAGOGASTRODUODENOSCOPY (EGD) WITH PROPOFOL;  Surgeon: Laurence Spates, MD;  Location: [facility] END[facility]COPY;  Service: Endoscopy;  Laterality: N/A;  . ICD GENERATOR CHANGEOUT N/A 09/06/2018   Procedure: ICD GENERATOR CHANGEOUT;  Surgeon: Deboraha Sprang, MD;  Location: [facility];  Service: Cardiovascular;  Laterality: N/A;  . 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     Family History  Problem Relation Age of Onset  . Allergies Mother   . Diabetes Mother   . Hypertension Mother   . Hyperlipidemia Mother   . Migraines Mother   . Hyperlipidemia Father   . Hypertension Father   . Diabetes Father   . Hyperlipidemia Sister   . Hypertension Sister   .  Meniere's disease Sister   . COPD Maternal Grandfather   . Leukemia Paternal Grandmother   . Heart attack Paternal Grandfather     Social History   Tobacco Use  . Smoking status: Former Smoker    Packs/day: 1.00    Years: 15.00    Pack years: 15.00    Types: Cigarettes    Quit date: 03/03/2007    Years since quitting: 13.0  . Smokeless tobacco: Never Used  Substance Use Topics  . Alcohol use: No    Alcohol/week: 0.0 standard drinks  . Drug use: No    Home Medications Prior to Admission medications   Medication Sig Start Date End Date Taking? Authorizing  Provider  Diclofenac Potassium (CAMBIA) 50 MG PACK Take 50 mg by mouth as directed. Mix in 2-3 oz of water. Limit of 1 per 24 hrs Patient not taking: Reported on 03/12/2020 06/20/19   Pieter Partridge, DO  etodolac (LODINE) 300 MG capsule Take 1 capsule (300 mg total) by mouth every 8 (eight) hours. 03/12/20   Dorie Rank, MD  Galcanezumab-gnlm [facility]) 120 MG/ML SOSY Inject 120 mg into the skin every 30 (thirty) days. 11/05/19   Pieter Partridge, DO  ibuprofen (ADVIL,MOTRIN) 200 MG tablet Take 200-600 mg by mouth daily as needed for headache or moderate pain.    [provider]  Multiple Vitamin (MULTIVITAMIN WITH MINERALS) TABS tablet Take 1 tablet by mouth daily.    [provider]  NURTEC 75 MG TBDP DISSOLVE 1 TABLET ON THE TONGUE DAILY AS NEEDED 09/22/19   Tomi Likens, Adam R, DO  topiramate (TOPAMAX) 100 MG tablet Take 1 tablet (100 mg total) by mouth at bedtime. Patient not taking: Reported on 03/12/2020 05/15/18   Pieter Partridge, DO    Allergies    Patient has no known allergies.  Review of Systems   Review of Systems  All other systems reviewed and are negative.   Physical Exam Updated Vital Signs BP (!) 154/92   Pulse 81   Temp 98.7 F (37.1 C) (Oral)   Resp 18   Ht 1.626 m (5\' 4" )   Wt 99.8 kg   LMP 12/25/2006   SpO2 99%   BMI 37.76 kg/m   Physical Exam Vitals and nursing note reviewed.  Constitutional:      General: She is not in acute distress.    Appearance: She is well-developed.  HENT:     Head: Normocephalic and atraumatic.     Right Ear: External ear normal.     Left Ear: External ear normal.  Eyes:     General: No scleral icterus.       Right eye: No discharge.        Left eye: No discharge.     Conjunctiva/sclera: Conjunctivae normal.  Neck:     Trachea: No tracheal deviation.  Cardiovascular:     Rate and Rhythm: Normal rate and regular rhythm.  Pulmonary:     Effort: Pulmonary effort is normal. No respiratory distress.     Breath sounds:  Normal breath sounds. No stridor. No wheezing or rales.  Abdominal:     General: Bowel sounds are normal. There is no distension.     Palpations: Abdomen is soft.     Tenderness: There is abdominal tenderness in the right lower quadrant. There is guarding. There is no rebound.  Genitourinary:    Exam position: Supine.     Labia:        Right: No rash, tenderness or  lesion.        Left: No rash, tenderness or lesion.      Vagina: Normal.     Cervix: No cervical motion tenderness or discharge.     Adnexa:        Right: No mass, tenderness or fullness.         Left: No mass, tenderness or fullness.    Musculoskeletal:        General: No tenderness.     Cervical back: Neck supple.  Skin:    General: Skin is warm and dry.     Findings: No rash.  Neurological:     Mental Status: She is alert.     Cranial Nerves: No cranial nerve deficit (no facial droop, extraocular movements intact, no slurred speech).     Sensory: No sensory deficit.     Motor: No abnormal muscle tone or seizure activity.     Coordination: Coordination normal.     ED Results / Procedures / Treatments   Labs (all labs ordered are listed, but only abnormal results are displayed) Labs Reviewed  COMPREHENSIVE METABOLIC PANEL - Abnormal; Notable for the following components:      Result Value   Glucose, Bld 111 (*)    All other components within normal limits  URINALYSIS, ROUTINE W REFLEX MICROSCOPIC - Abnormal; Notable for the following components:   Specific Gravity, Urine <1.005 (*)    Hgb urine dipstick TRACE (*)    All other components within normal limits  CBC WITH DIFFERENTIAL/PLATELET - Abnormal; Notable for the following components:   WBC 12.9 (*)    Platelets 426 (*)    Neutro Abs 8.6 (*)    All other components within normal limits  URINALYSIS, MICROSCOPIC (REFLEX) - Abnormal; Notable for the following components:   Bacteria, UA RARE (*)    All other components within normal limits  GC/CHLAMYDIA  PROBE AMP ([place]) NOT AT [facility]    EKG None  Radiology CT ABDOMEN PELVIS W CONTRAST  Result Date: 03/12/2020 CLINICAL DATA:  [place] pain EXAM: CT ABDOMEN AND PELVIS WITH CONTRAST TECHNIQUE: Multidetector CT imaging of the abdomen and pelvis was performed using the standard protocol following bolus administration of intravenous contrast. CONTRAST:  1102mL OMNIPAQUE IOHEXOL 300 MG/ML  SOLN COMPARISON:  05/24/2018 FINDINGS: Lower chest: Pacer wires noted in the right heart. No acute abnormality. Hepatobiliary: No focal hepatic abnormality. Gallbladder unremarkable. Pancreas: No focal abnormality or ductal dilatation. Spleen: No focal abnormality.  Normal size. Adrenals/Urinary Tract: No adrenal abnormality. No focal renal abnormality. No stones or hydronephrosis. Urinary bladder is unremarkable. Stomach/Bowel: Normal appendix. Stomach, large and small bowel grossly unremarkable. Vascular/Lymphatic: No evidence of aneurysm or adenopathy. Reproductive: Uterus and adnexa unremarkable.  No mass. Other: No free fluid or free air. Musculoskeletal: No acute bony abnormality. IMPRESSION: Normal appendix. No acute findings in the abdomen or pelvis. Electronically Signed   By: Rolm Baptise M.D.   On: 03/12/2020 21:31    Procedures Procedures (including critical care time)  Medications Ordered in ED Medications  sodium chloride 0.9 % bolus 1,000 mL (1,000 mLs Intravenous New Bag/Given 03/12/20 2044)    Followed by  0.9 %  sodium chloride infusion (has no administration in time range)  morphine 4 MG/ML injection 4 mg (4 mg Intravenous Given 03/12/20 2045)  ondansetron (ZOFRAN) injection 4 mg (4 mg Intravenous Given 03/12/20 2044)  iohexol (OMNIPAQUE) 300 MG/ML solution 100 mL (100 mLs Intravenous Contrast Given 03/12/20 2115)  ketorolac (TORADOL) 30 MG/ML injection 30  mg (30 mg Intravenous Given 03/12/20 2154)    ED Course  I have reviewed the triage vital signs and the nursing notes.  Pertinent labs &  imaging results that were available during my care of the patient were reviewed by me and considered in my medical decision making (see chart for details).    MDM Rules/Calculators/A&P                      Patient's ED work-up is reassuring.  Laboratory tests are unremarkable with exception of her leukocytosis.  CT scan does not demonstrate any acute abnormalities.  Pelvic exam is unremarkable.  Will dc home with nsaids, outpt follow up Final Clinical Impression(s) / ED Diagnoses Final diagnoses:  Right lower quadrant abdominal pain    Rx / DC Orders ED Discharge Orders         Ordered    etodolac (LODINE) 300 MG capsule  Every 8 hours    Note to Pharmacy: As needed for pain   03/12/20 2225           Dorie Rank, MD 03/12/20 2225

## 2020-03-12 NOTE — Progress Notes (Signed)
Virtual Visit via Video Note  I connected with Alexandra Hood on 03/12/20 at  4:50 PM EDT by a video enabled telemedicine application and verified that I am speaking with the correct person using two identifiers.  Location: Patient: home  Provider: PCP   I discussed the limitations of evaluation and management by telemedicine and the availability of in person appointments. The patient expressed understanding and agreed to proceed.  History of Present Illness: Patient reports an almost 5-day history of increasing, worsening right lower quadrant abdominal pain.  Mildly tender but has not changed in location.  It does radiate around to the back.  She denies fevers or chills.  She does have night sweats typically because she has sarcoid.  She is not on any immunosuppressants currently.  She does not have her ovaries so there is nothing else in the lower right quadrant area and the differential other than potentially kidney stones which she has also not had.  Her daughter who is an Therapist, sports wanted her to go to the emergency room for evaluation of a possible acute appendicitis and I do not disagree.   Observations/Objective:  GEN: WDWN, NAD, Non-toxic, Alert & Oriented x 3 HEENT: Atraumatic, Normocephalic.  Ears and Nose: No external deformity. EXTR: No clubbing/cyanosis/edema NEURO: Normal gait.  PSYCH: Normally interactive. Conversant. Not depressed or anxious appearing.  Calm demeanor.    Assessment and Plan: 1. Right lower quadrant abdominal pain     Recommended the patient go to ER for possible appendicitis.  She verbalized understanding.    Follow Up Instructions:    I discussed the assessment and treatment plan with the patient. The patient was provided an opportunity to ask questions and all were answered. The patient agreed with the plan and demonstrated an understanding of the instructions.   The patient was advised to call back or seek an in-person evaluation if the symptoms  worsen or if the condition fails to improve as anticipated.  I provided  10 minutes of non-face-to-face time during this encounter.   Glyn Ade, NP

## 2020-03-12 NOTE — ED Notes (Signed)
Pt. Reports she is still having pain and would like to try something other than Morphine.  Dr. Tomi Bamberger notified and will look into her results before any orders given.

## 2020-03-12 NOTE — ED Triage Notes (Signed)
Right lower quadrant pain x 4 days. Pain is getting worse.

## 2020-03-12 NOTE — Discharge Instructions (Addendum)
Take the medications as needed for pain.  Follow-up with your doctor if the symptoms persist

## 2020-03-16 LAB — GC/CHLAMYDIA PROBE AMP (~~LOC~~) NOT AT ARMC
Chlamydia: NEGATIVE
Neisseria Gonorrhea: NEGATIVE

## 2020-03-25 ENCOUNTER — Ambulatory Visit (INDEPENDENT_AMBULATORY_CARE_PROVIDER_SITE_OTHER): Payer: BC Managed Care – PPO | Admitting: *Deleted

## 2020-03-25 ENCOUNTER — Other Ambulatory Visit: Payer: Self-pay

## 2020-03-25 ENCOUNTER — Ambulatory Visit: Payer: BC Managed Care – PPO | Admitting: Adult Health Nurse Practitioner

## 2020-03-25 VITALS — BP 132/88 | HR 92 | Temp 98.2°F | Ht 65.0 in | Wt 227.2 lb

## 2020-03-25 DIAGNOSIS — R10823 Right lower quadrant rebound abdominal tenderness: Secondary | ICD-10-CM

## 2020-03-25 DIAGNOSIS — D72829 Elevated white blood cell count, unspecified: Secondary | ICD-10-CM | POA: Diagnosis not present

## 2020-03-25 DIAGNOSIS — D860 Sarcoidosis of lung: Secondary | ICD-10-CM

## 2020-03-25 DIAGNOSIS — I429 Cardiomyopathy, unspecified: Secondary | ICD-10-CM

## 2020-03-25 DIAGNOSIS — R109 Unspecified abdominal pain: Secondary | ICD-10-CM | POA: Diagnosis not present

## 2020-03-25 DIAGNOSIS — R300 Dysuria: Secondary | ICD-10-CM | POA: Diagnosis not present

## 2020-03-25 DIAGNOSIS — R1031 Right lower quadrant pain: Secondary | ICD-10-CM

## 2020-03-25 LAB — CUP PACEART REMOTE DEVICE CHECK
Battery Remaining Longevity: 109 mo
Battery Voltage: 2.99 V
Brady Statistic AP VP Percent: 0 %
Brady Statistic AP VS Percent: 0.01 %
Brady Statistic AS VP Percent: 0.04 %
Brady Statistic AS VS Percent: 99.96 %
Brady Statistic RA Percent Paced: 0.01 %
Brady Statistic RV Percent Paced: 0.04 %
Date Time Interrogation Session: 20210401033426
HighPow Impedance: 46 Ohm
HighPow Impedance: 53 Ohm
Implantable Lead Implant Date: 20120308
Implantable Lead Implant Date: 20120308
Implantable Lead Location: 753859
Implantable Lead Location: 753860
Implantable Lead Model: 5076
Implantable Lead Model: 6947
Implantable Pulse Generator Implant Date: 20190913
Lead Channel Impedance Value: 323 Ohm
Lead Channel Impedance Value: 399 Ohm
Lead Channel Impedance Value: 437 Ohm
Lead Channel Pacing Threshold Amplitude: 0.5 V
Lead Channel Pacing Threshold Amplitude: 0.625 V
Lead Channel Pacing Threshold Pulse Width: 0.4 ms
Lead Channel Pacing Threshold Pulse Width: 0.4 ms
Lead Channel Sensing Intrinsic Amplitude: 1.875 mV
Lead Channel Sensing Intrinsic Amplitude: 1.875 mV
Lead Channel Sensing Intrinsic Amplitude: 6.125 mV
Lead Channel Sensing Intrinsic Amplitude: 6.125 mV
Lead Channel Setting Pacing Amplitude: 1.5 V
Lead Channel Setting Pacing Amplitude: 2 V
Lead Channel Setting Pacing Pulse Width: 0.4 ms
Lead Channel Setting Sensing Sensitivity: 0.45 mV

## 2020-03-25 LAB — POCT URINALYSIS DIP (MANUAL ENTRY)
Bilirubin, UA: NEGATIVE
Glucose, UA: NEGATIVE mg/dL
Ketones, POC UA: NEGATIVE mg/dL
Leukocytes, UA: NEGATIVE
Nitrite, UA: NEGATIVE
Protein Ur, POC: NEGATIVE mg/dL
Spec Grav, UA: >=1.03 — AB (ref 1.010–1.025)
Urobilinogen, UA: 0.2 E.U./dL
pH, UA: 5 (ref 5.0–8.0)

## 2020-03-25 LAB — POC MICROSCOPIC URINALYSIS (UMFC): Mucus: ABSENT

## 2020-03-25 MED ORDER — CIPROFLOXACIN HCL 250 MG PO TABS: 250.0000 mg | ORAL_TABLET | Freq: Two times a day (BID) | ORAL | 0 refills | Status: DC

## 2020-03-25 MED ORDER — TRAMADOL HCL 50 MG PO TABS: 50.0000 mg | ORAL_TABLET | Freq: Three times a day (TID) | ORAL | 0 refills | Status: AC | PRN

## 2020-03-25 NOTE — Patient Instructions (Signed)
° ° ° °  If you have lab work done today you will be contacted with your lab results within the next 2 weeks.  If you have not heard from us then please contact us. The fastest way to get your results is to register for My Chart. ° ° °IF you received an x-ray today, you will receive an invoice from [facility]. Please contact [facility] at [phone] with questions or concerns regarding your invoice.  ° °IF you received labwork today, you will receive an invoice from [facility]. Please contact [facility] at [phone] with questions or concerns regarding your invoice.  ° °Our billing staff will not be able to assist you with questions regarding bills from these companies. ° °You will be contacted with the lab results as soon as they are available. The fastest way to get your results is to activate your My Chart account. Instructions are located on the last page of this paperwork. If you have not heard from us regarding the results in 2 weeks, please contact this office. °  ° ° ° °

## 2020-03-25 NOTE — Progress Notes (Signed)
ICD Remote  

## 2020-03-26 LAB — CBC WITH DIFFERENTIAL/PLATELET
Basophils Absolute: 0.1 10*3/uL (ref 0.0–0.2)
Basos: 1 %
EOS (ABSOLUTE): 0.2 10*3/uL (ref 0.0–0.4)
Eos: 3 %
Hematocrit: 40.5 % (ref 34.0–46.6)
Hemoglobin: 13 g/dL (ref 11.1–15.9)
Immature Grans (Abs): 0 10*3/uL (ref 0.0–0.1)
Immature Granulocytes: 0 %
Lymphocytes Absolute: 2.1 10*3/uL (ref 0.7–3.1)
Lymphs: 27 %
MCH: 28.1 pg (ref 26.6–33.0)
MCHC: 32.1 g/dL (ref 31.5–35.7)
MCV: 88 fL (ref 79–97)
Monocytes Absolute: 0.6 10*3/uL (ref 0.1–0.9)
Monocytes: 8 %
Neutrophils Absolute: 4.8 10*3/uL (ref 1.4–7.0)
Neutrophils: 61 %
Platelets: 369 10*3/uL (ref 150–450)
RBC: 4.62 x10E6/uL (ref 3.77–5.28)
RDW: 13.6 % (ref 11.7–15.4)
WBC: 7.7 10*3/uL (ref 3.4–10.8)

## 2020-03-26 LAB — CMP14+EGFR
ALT: 19 IU/L (ref 0–32)
AST: 19 IU/L (ref 0–40)
Albumin/Globulin Ratio: 1.8 (ref 1.2–2.2)
Albumin: 4.4 g/dL (ref 3.8–4.8)
Alkaline Phosphatase: 114 IU/L (ref 39–117)
BUN/Creatinine Ratio: 14 (ref 9–23)
BUN: 10 mg/dL (ref 6–24)
Bilirubin Total: <0.2 mg/dL (ref 0.0–1.2)
CO2: 25 mmol/L (ref 20–29)
Calcium: 9.8 mg/dL (ref 8.7–10.2)
Chloride: 103 mmol/L (ref 96–106)
Creatinine, Ser: 0.7 mg/dL (ref 0.57–1.00)
GFR calc Af Amer: 120 mL/min/{1.73_m2} (ref >59)
GFR calc non Af Amer: 104 mL/min/{1.73_m2} (ref >59)
Globulin, Total: 2.5 g/dL (ref 1.5–4.5)
Glucose: 111 mg/dL — ABNORMAL HIGH (ref 65–99)
Potassium: 4.4 mmol/L (ref 3.5–5.2)
Sodium: 141 mmol/L (ref 134–144)
Total Protein: 6.9 g/dL (ref 6.0–8.5)

## 2020-03-26 LAB — LIPID PANEL
Chol/HDL Ratio: 5.6 ratio — ABNORMAL HIGH (ref 0.0–4.4)
Cholesterol, Total: 247 mg/dL — ABNORMAL HIGH (ref 100–199)
HDL: 44 mg/dL (ref >39)
LDL Chol Calc (NIH): 177 mg/dL — ABNORMAL HIGH (ref 0–99)
Triglycerides: 143 mg/dL (ref 0–149)
VLDL Cholesterol Cal: 26 mg/dL (ref 5–40)

## 2020-03-26 LAB — URINE CULTURE

## 2020-03-26 LAB — SEDIMENTATION RATE: Sed Rate: 41 mm/hr — ABNORMAL HIGH (ref 0–32)

## 2020-03-26 LAB — TSH: TSH: 2.35 u[IU]/mL (ref 0.450–4.500)

## 2020-03-26 LAB — C-REACTIVE PROTEIN: CRP: 11 mg/L — ABNORMAL HIGH (ref 0–10)

## 2020-03-30 ENCOUNTER — Ambulatory Visit
Admission: RE | Admit: 2020-03-30 | Discharge: 2020-03-30 | Disposition: A | Discharge status: Home / Self Care | Payer: BC Managed Care – PPO | Source: Ambulatory Visit | Attending: Adult Health Nurse Practitioner | Admitting: Adult Health Nurse Practitioner

## 2020-03-30 ENCOUNTER — Other Ambulatory Visit: Payer: Self-pay

## 2020-03-30 ENCOUNTER — Encounter: Payer: Self-pay | Admitting: Adult Health Nurse Practitioner

## 2020-03-30 ENCOUNTER — Ambulatory Visit: Payer: BC Managed Care – PPO | Admitting: Adult Health Nurse Practitioner

## 2020-03-30 VITALS — BP 145/79 | HR 97 | Temp 98.3°F | Resp 16 | Ht 65.0 in | Wt 227.0 lb

## 2020-03-30 DIAGNOSIS — R10823 Right lower quadrant rebound abdominal tenderness: Secondary | ICD-10-CM

## 2020-03-30 DIAGNOSIS — R1031 Right lower quadrant pain: Secondary | ICD-10-CM

## 2020-03-30 DIAGNOSIS — R109 Unspecified abdominal pain: Secondary | ICD-10-CM

## 2020-03-30 DIAGNOSIS — D72829 Elevated white blood cell count, unspecified: Secondary | ICD-10-CM

## 2020-03-30 DIAGNOSIS — D860 Sarcoidosis of lung: Secondary | ICD-10-CM

## 2020-03-30 LAB — POCT URINALYSIS DIP (MANUAL ENTRY)
Bilirubin, UA: NEGATIVE
Blood, UA: NEGATIVE
Glucose, UA: NEGATIVE mg/dL
Ketones, POC UA: NEGATIVE mg/dL
Leukocytes, UA: NEGATIVE
Nitrite, UA: NEGATIVE
Protein Ur, POC: NEGATIVE mg/dL
Spec Grav, UA: 1.025 (ref 1.010–1.025)
Urobilinogen, UA: 0.2 E.U./dL
pH, UA: 5.5 (ref 5.0–8.0)

## 2020-03-30 MED ORDER — HYDROCODONE-ACETAMINOPHEN 5-325 MG PO TABS: 1.0000 | ORAL_TABLET | ORAL | 0 refills | Status: AC | PRN

## 2020-03-30 NOTE — Patient Instructions (Signed)
° ° ° °  If you have lab work done today you will be contacted with your lab results within the next 2 weeks.  If you have not heard from us then please contact us. The fastest way to get your results is to register for My Chart. ° ° °IF you received an x-ray today, you will receive an invoice from [facility]. Please contact [facility] at [phone] with questions or concerns regarding your invoice.  ° °IF you received labwork today, you will receive an invoice from [facility]. Please contact [facility] at [phone] with questions or concerns regarding your invoice.  ° °Our billing staff will not be able to assist you with questions regarding bills from these companies. ° °You will be contacted with the lab results as soon as they are available. The fastest way to get your results is to activate your My Chart account. Instructions are located on the last page of this paperwork. If you have not heard from us regarding the results in 2 weeks, please contact this office. °  ° ° ° °

## 2020-03-31 ENCOUNTER — Ambulatory Visit: Payer: BC Managed Care – PPO | Admitting: Physician Assistant

## 2020-03-31 ENCOUNTER — Encounter: Payer: Self-pay | Admitting: Physician Assistant

## 2020-03-31 VITALS — BP 140/80 | HR 96 | Temp 98.7°F | Ht 65.0 in | Wt 226.0 lb

## 2020-03-31 DIAGNOSIS — R110 Nausea: Secondary | ICD-10-CM | POA: Diagnosis not present

## 2020-03-31 DIAGNOSIS — R1031 Right lower quadrant pain: Secondary | ICD-10-CM

## 2020-03-31 MED ORDER — HYOSCYAMINE SULFATE 0.125 MG SL SUBL: 0.1250 mg | SUBLINGUAL_TABLET | SUBLINGUAL | 3 refills | Status: DC | PRN

## 2020-03-31 MED ORDER — NA SULFATE-K SULFATE-MG SULF 17.5-3.13-1.6 GM/177ML PO SOLN: 1.0000 | Freq: Once | ORAL | 0 refills | Status: AC

## 2020-03-31 NOTE — Progress Notes (Signed)
Chief Complaint: Right-sided abdominal pain  HPI:    Alexandra Hood is a 49 year old African-American female with a past medical history as listed below including ventricular tachycardia status post pacemaker, pseudotumor cerebri and pulmonary sarcoidosis, who was referred to me by Wendall Mola, NP for a complaint of right-sided abdominal pain.      02/25/2016 EGD by Dr. Oletta Lamas.  This was normal and done for chronic right upper quadrant pain which had negative work-up to that point.  She was scheduled for a HIDA scan with [facility].    03/02/2016 HIDA scan was normal.    04/10/2016 CT abdomen pelvis with and without contrast due to right-sided abdominal pain.  This showed stable abdominal and pelvic lymph nodes possibly related to patient's sarcoidosis.  No acute abdominal/pelvic findings.    03/12/2020 patient seen in the ER for abdominal pain.  CT the abdomen pelvis with contrast showed normal appendix and no acute findings in the abdomen or pelvis.  Labs are normal.  Patient was given some etodolic 952 mg capsules every 8 hours for pain.    03/25/2020 CRP minimally elevated at 11.  ESR elevated at 41.  CBC and CMP normal.    03/30/2020 abdominal ultrasound for right lower quadrant pain.  This showed no acute ultrasound findings of the abdomen to explain right lower quadrant pain.  Nonmobile gallbladder polyp or adherent calculus measuring 4 mm.  Recommend follow-up ultrasound at 1 year.  As well as hepatic steatosis.    Today, the patient presents to clinic and explains that she has had a right upper quadrant pain which comes and goes for a few years now and nobody has been able to figure out what it is but most concerning to her today is that 3 weeks ago she started with the right lower quadrant pain which she "woke up with".  Apparently that day it worsened and she went to the ER for fear of appendicitis.  Work-up was negative there, as above, she was given NSAIDs which did not help at all.    Currently, the pain has gotten worse and is now constant rated as a 9/10 and stabbing in nature.  It seems to radiate through to her back.  When she is sitting up in a chair it seems to make it worse and often she has to kind to lift up her belly on that side and lean to the opposite side in order to help with the pain some.  Associated symptoms include nausea in the beginning but no vomiting.  She has been having normal solid bowel movements every day.    History of bilateral oophorectomy.    Denies fever, chills, blood in her stool or symptoms that awaken her from sleep.  Past Medical History:  Diagnosis Date  . Headache   . Lupus pernio    -skin bx confirmed at [facility] 05/07/2009 (personally reviewed) -looks improved 07/09/2009 -  . Pertussis   . Pseudotumor cerebri    .Marland KitchenMarland KitchenDr Sabra Heck and Dr. Katy Fitch - dxed Jan 2009, on diamox  - LP 01/24/2008 at [facility] - Normal ( pressure 14cm, gluc 68, Protein 23, Clear, colorless, RBC 3, WBC - too few to count)  - CT head non contrast done at [facility] 11/25/2007: "normal". Ddoes not report MRI, MRA, MRV being done - 2nd opinon by Dr. Leonie Man - reportdly had MRI/MRV both normal; followup pending  . Pulmonary sarcoidosis ([place])    - Spiro 07/25/2010 on pred 10/mtx 10 -> FEv1  1.84L/775%, FVC 2.25L - SPiro 09/06/2010 -> Fev1 2.14L/81%. Cut pred to 47m per day. Cont mtx - SPiro 01/20/2011 - Fev1 1.6L/61%, Ratio 74%. CXR Worse -> increased pred to 166mperday. Cont Mtx.    . [place]  . Vitamin D deficiency    - Aug 2011: 22 and low -> commence Rx. - Jan 2012: 11    Past Surgical History:  Procedure Laterality Date  . CARDIAC DEFIBRILLATOR PLACEMENT  march 2012   [employer]  . ESOPHAGOGASTRODUODENOSCOPY (EGD) WITH PROPOFOL N/A 02/25/2016   Procedure: ESOPHAGOGASTRODUODENOSCOPY (EGD) WITH PROPOFOL;  Surgeon: JaLaurence SpatesMD;  Location: [facility];  Service: Endoscopy;  Laterality: N/A;  . ICD GENERATOR CHANGEOUT N/A 09/06/2018    Procedure: ICD GENERATOR CHANGEOUT;  Surgeon: KlDeboraha SprangMD;  [facility];  Service: Cardiovascular;  Laterality: N/A;  . 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 ciprofloxacin (CIPRO) 250 MG tablet Take 1 tablet (250 mg total) by mouth 2 (two) times daily. (Patient not taking: Reported on 03/30/2020) 6 tablet 0  . Diclofenac Potassium (CAMBIA) 50 MG PACK Take 50 mg by mouth as directed. Mix in 2-3 oz of water. Limit of 1 per 24 hrs 9 each 6  . etodolac (LODINE) 300 MG capsule Take 1 capsule (300 mg total) by mouth every 8 (eight) hours. 21 capsule 0  . Galcanezumab-gnlm (EMGALITY) 120 MG/ML SOSY Inject 120 mg into the skin every 30 (thirty) days. 1 mL 11  . HYDROcodone-acetaminophen (NORCO/VICODIN) 5-325 MG tablet Take 1 tablet by mouth every 4 (four) hours as needed for up to 7 days for moderate pain. 30 tablet 0  . ibuprofen (ADVIL,MOTRIN) 200 MG tablet Take 200-600 mg by mouth daily as needed for headache or moderate pain.    . Multiple Vitamin (MULTIVITAMIN WITH MINERALS) TABS tablet Take 1 tablet by mouth daily.    . NURTEC 75 MG TBDP DISSOLVE 1 TABLET ON THE TONGUE DAILY AS NEEDED 8 tablet 6  . topiramate (TOPAMAX) 100 MG tablet Take 1 tablet (100 mg total) by mouth at bedtime. 30 tablet 3   No current facility-administered medications for this visit.    Allergies as of 03/31/2020  . (Not on File)    Family History  Problem Relation Age of Onset  . Allergies Mother   . Diabetes Mother   . Hypertension Mother   . Hyperlipidemia Mother   . Migraines Mother   . Hyperlipidemia Father   . Hypertension Father   . Diabetes Father   . Hyperlipidemia Sister   . Hypertension Sister   . Meniere's disease Sister   . COPD Maternal Grandfather   . Leukemia Paternal Grandmother   . Heart attack Paternal Grandfather     Social History   Socioeconomic History  . Marital status: Legally Separated      Spouse name: Not on file  . Number of children: 1  . Years of education: Not on file  . Highest education level: Master's degree (e.g., MA, MS, MEng, MEd, MSW, MBA)  Occupational History  . 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 Smoking status: Former Smoker    Packs/day: 1.00    Years: 15.00    Pack years: 15.00    Types: Cigarettes    Quit date: 03/03/2007    Years since quitting: 13.0  . Smokeless tobacco: Never Used  Substance and Sexual Activity  .  Alcohol use: No    Alcohol/week: 0.0 standard drinks  . Drug use: No  . Sexual activity: Not Currently    Comment: bilateral oophorectomy  Other Topics Concern  . 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 She is separated, has one daughter. Lives in 2 story house, master on 1st. Drinks an occasional cup of coffee, 2 glasses of tea a day. Walks her dog daily.   Social Determinants of Health   Financial Resource Strain:   . Difficulty of Paying Living Expenses:   Food Insecurity:   . Worried About Charity fundraiser in the Last Year:   . Arboriculturist in the Last Year:   Transportation Needs:   . Film/video editor (Medical):   Marland Kitchen Lack of Transportation (Non-Medical):   Physical Activity:   . Days of Exercise per Week:   . Minutes of Exercise per Session:   Stress:   . Feeling of Stress :   Social Connections:   . Frequency of Communication with Friends and Family:   . Frequency of Social Gatherings with Friends and Family:   . Attends Religious Services:   . Active Member of Clubs or Organizations:   . Attends Archivist Meetings:   Marland Kitchen Marital Status:   Intimate Partner Violence:   . Fear of Current or Ex-Partner:   . Emotionally Abused:   Marland Kitchen Physically Abused:   . Sexually Abused:     Review of Systems:    Constitutional: No weight loss, fever or  chills Skin: No rash  Cardiovascular: No chest pain Respiratory: No SOB  Gastrointestinal: See HPI and otherwise negative Genitourinary: No dysuria  Neurological: No headache, dizziness or syncope Musculoskeletal: No new muscle or joint pain Hematologic: No bleeding  Psychiatric: No history of depression or anxiety   Physical Exam:  Vital signs: BP 140/80   Pulse 96   Temp 98.7 F (37.1 C)   Ht 5' 5" (1.651 m)   Wt 226 lb (102.5 kg)   LMP 12/25/2006   BMI 37.61 kg/m   Constitutional:   Pleasant AA female appears to be in NAD, Well developed, Well nourished, alert and cooperative Head:  Normocephalic and atraumatic. Eyes:   PEERL, EOMI. No icterus. Conjunctiva pink. Ears:  Normal auditory acuity. Neck:  Supple Throat: Oral cavity and pharynx without inflammation, swelling or lesion.  Respiratory: Respirations even and unlabored. Lungs clear to auscultation bilaterally.   No wheezes, crackles, or rhonchi.  Cardiovascular: Normal S1, S2. No MRG. Regular rate and rhythm. No peripheral edema, cyanosis or pallor.  Gastrointestinal:  Soft, nondistended, moderate RUQ pain, marked RLQ ttp with involuntary guarding. Normal bowel sounds. No appreciable masses or hepatomegaly. Rectal:  Not performed.  Msk:  Symmetrical without gross deformities. Without edema, no deformity or joint abnormality.  Neurologic:  Alert and  oriented x4;  grossly normal neurologically.  Skin:   Dry and intact without significant lesions or rashes. Psychiatric: Demonstrates good judgement and reason without abnormal affect or behaviors.  RELEVANT LABS AND IMAGING: CBC    Component Value Date/Time   WBC 7.7 03/25/2020 1042   WBC 12.9 (H) 03/12/2020 1844   RBC 4.62 03/25/2020 1042   RBC 4.52 03/12/2020 1844   HGB 13.0 03/25/2020 1042   HGB 12.3 03/27/2012 1358   HCT 40.5 03/25/2020 1042   HCT  37.9 03/27/2012 1358   PLT 369 03/25/2020 1042   MCV 88 03/25/2020 1042   MCV 86.1 03/27/2012 1358   MCH 28.1  03/25/2020 1042   MCH 28.3 03/12/2020 1844   MCHC 32.1 03/25/2020 1042   MCHC 31.9 03/12/2020 1844   RDW 13.6 03/25/2020 1042   RDW 13.9 03/27/2012 1358   LYMPHSABS 2.1 03/25/2020 1042   LYMPHSABS 1.3 03/27/2012 1358   MONOABS 0.8 03/12/2020 1844   MONOABS 0.6 03/27/2012 1358   EOSABS 0.2 03/25/2020 1042   BASOSABS 0.1 03/25/2020 1042   BASOSABS 0.0 03/27/2012 1358    CMP     Component Value Date/Time   NA 141 03/25/2020 1042   K 4.4 03/25/2020 1042   CL 103 03/25/2020 1042   CO2 25 03/25/2020 1042   GLUCOSE 111 (H) 03/25/2020 1042   GLUCOSE 111 (H) 03/12/2020 1844   BUN 10 03/25/2020 1042   CREATININE 0.70 03/25/2020 1042   CREATININE 0.61 01/10/2016 1334   CALCIUM 9.8 03/25/2020 1042   PROT 6.9 03/25/2020 1042   ALBUMIN 4.4 03/25/2020 1042   AST 19 03/25/2020 1042   ALT 19 03/25/2020 1042   ALKPHOS 114 03/25/2020 1042   BILITOT <0.2 03/25/2020 1042   GFRNONAA 104 03/25/2020 1042   GFRNONAA >89 05/21/2014 1318   GFRAA 120 03/25/2020 1042   GFRAA >89 05/21/2014 1318    Assessment: 1.  Right lower quadrant pain: For the past 3 weeks, worsening and now chronic, CT and ultrasound normal, labs normal other than minimally elevated white count 12.9 at recent ER visit; consider effect from sarcoidosis versus IBD versus other  Plan: 1.  Scheduled patient for colonoscopy in the [place] with Dr. Silverio Decamp.  Did discuss risks, benefits, limitations and alternatives and the patient agrees to proceed.  Patient will be Covid tested 2 days prior to time of procedure. 2.  Prescribed Hyoscyamine 0.125 mg sublingual tabs to be used every 6 hours as needed for pain #30 with 3 refills. 3.  Patient to follow in clinic per recommendations from Dr. Silverio Decamp after time of procedure.  Ellouise Newer, PA-C [facility] 03/31/2020, 9:38 AM  Cc: Wendall Mola, NP

## 2020-03-31 NOTE — Patient Instructions (Signed)
If you are age 47 or older, your body mass index should be between 23-30. Your Body mass index is 37.61 kg/m. If this is out of the aforementioned range listed, please consider follow up with your Primary Care Provider.  If you are age 62 or younger, your body mass index should be between 19-25. Your Body mass index is 37.61 kg/m. If this is out of the aformentioned range listed, please consider follow up with your Primary Care Provider.   We have sent the following medications to your pharmacy for you to pick up at your convenience: Hyoscyamine 0.12mg  SL tablets every 6 hours as needed.

## 2020-04-06 ENCOUNTER — Ambulatory Visit: Payer: BC Managed Care – PPO | Admitting: Adult Health Nurse Practitioner

## 2020-04-06 ENCOUNTER — Other Ambulatory Visit: Payer: Self-pay

## 2020-04-06 ENCOUNTER — Encounter: Payer: Self-pay | Admitting: Gastroenterology

## 2020-04-06 ENCOUNTER — Encounter: Payer: Self-pay | Admitting: Adult Health Nurse Practitioner

## 2020-04-06 VITALS — BP 131/82 | HR 91 | Temp 98.2°F | Ht 65.0 in | Wt 227.0 lb

## 2020-04-06 DIAGNOSIS — R10823 Right lower quadrant rebound abdominal tenderness: Secondary | ICD-10-CM | POA: Diagnosis not present

## 2020-04-06 DIAGNOSIS — D860 Sarcoidosis of lung: Secondary | ICD-10-CM | POA: Diagnosis not present

## 2020-04-06 MED ORDER — QVAR REDIHALER 80 MCG/ACT IN AERB: 2.0000 | INHALATION_SPRAY | Freq: Two times a day (BID) | RESPIRATORY_TRACT | 3 refills | Status: AC

## 2020-04-06 MED ORDER — ALBUTEROL SULFATE HFA 108 (90 BASE) MCG/ACT IN AERS: 2.0000 | INHALATION_SPRAY | Freq: Four times a day (QID) | RESPIRATORY_TRACT | 1 refills | Status: DC | PRN

## 2020-04-06 NOTE — Progress Notes (Signed)
Chief Complaint  Patient presents with  . Establish Care    Pt stated that she is still experiencing lower abdominal pain on her Rt side. She stated that she went to the ER and they could not find anything except her WBC are elevated.No constipation and not eating differentely.     HPI   Patient presents for worsening abdominal pain having been to the ER over the past week. CT of the abdomen was negative.  Upon chart reivew of labs and imaging, called the Radiology Reading room to get a second read of CT abdomen.  Ultrasound showed a non-obstructive nonmobile polyp in gallbladder but no acute findings.   Right lower quadrant pain is increasing in severity and intensity requiring pain mediation at this point.  Unable to sit up straight to do job secondary to pain.  She does have a hx of pseudotumor cerebri and sarcoidosis which could be contributing.  Checked labs due to WBC noted at last hospital visit.  Today, urine does show some blood.    Will try to get quick/acute appointment with GI and check other labs as well tday.    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  Medications    Current Meds  Medication Sig  . Diclofenac Potassium (CAMBIA) 50 MG PACK Take 50 mg by mouth as directed. Mix in 2-3 oz of water. Limit of 1 per 24 hrs  . etodolac (LODINE) 300 MG capsule Take 1 capsule (300 mg total) by mouth every 8 (eight) hours.  . Galcanezumab-gnlm (EMGALITY) 120 MG/ML SOSY Inject 120 mg into the skin every 30 (thirty) days.  Marland Kitchen ibuprofen (ADVIL,MOTRIN) 200 MG tablet Take 200-600 mg by mouth daily as needed for headache or moderate pain.  . Multiple Vitamin (MULTIVITAMIN WITH MINERALS) TABS tablet Take 1 tablet by mouth daily.  . NURTEC 75 MG TBDP DISSOLVE 1 TABLET ON THE TONGUE DAILY AS NEEDED  . topiramate (TOPAMAX) 100 MG tablet Take 1 tablet (100 mg total) by mouth at bedtime.    Review of Systems    Constitutional: Negative for activity change, appetite change, chills and fever.  HENT: Negative for congestion, nosebleeds, trouble swallowing and voice change.   Respiratory: Negative for cough, shortness of breath and wheezing.   Cardiac:  Negative for chest pain, pressure, syncope  Gastrointestinal: Negative for diarrhea, nausea and vomiting. Positive for lower right abdominal pain  Genitourinary: Negative for dysuria or vaginal discharge Musculoskeletal: Negative for back pain, joint swelling and neck pain.  Neurological: Negative for dizziness, speech difficulty, light-headedness and numbness.  See HPI. All other review of systems negative.     Physical Exam:    height is 5' 5"  (1.651 m) and weight is 227 lb 3.2 oz (103.1 kg). Her temporal temperature is 98.2 F (36.8 C).  Her blood pressure is 132/88 and her pulse is 92. Her oxygen saturation is 98%.   Physical Examination: General appearance - alert, well appearing,  and in no distress and oriented to person, place, and time Mental status - normal mood, behavior, speech, dress, motor activity, and thought processes Eyes - PERRL. Extraocular movements intact.  No nystagmus.  Neck - supple, no significant adenopathy, carotids upstroke normal bilaterally, no bruits, thyroid exam: thyroid is normal in size without nodules or tenderness Chest - clear to auscultation, no wheezes, rales or rhonchi, symmetric air entry  Heart - normal rate, regular rhythm, normal S1, S2, no murmurs, rubs, clicks or gallops. Abdominal exam: no bladder distension noted no abdominal bruits.  She is tender to deep palpation along the lower abdominal quadrand extending centrally to epigastric region and then less so to the left.     Extremities - dependent LE edema without clubbing or cyanosis Skin - normal coloration and turgor, no rashes, no suspicious skin lesions noted  No hyperpigmentation of skin.  No current hematomas noted   Lab /Imaging Review    labs reviewed, I note that WBC count elevated from last hospital visit,  Blood in U.A/, trace today , orders written for new lab studies as appropriate; see orders.   Assessment & Plan:  Alexandra Hood is a 49 y.o. female    1. Right lower quadrant abdominal tenderness with rebound tenderness   2. Flank pain   3. Leukocytosis, unspecified type   4. Pulmonary sarcoidosis ([place])   5. Dysuria    Orders Placed This Encounter  Procedures  . Urine Culture  . US Abdomen Complete  . CMP14+EGFR  . TSH  . Lipid Panel  . CBC with Differential/Platelet  . Sedimentation Rate  . C-reactive protein  . Ambulatory referral to Gastroenterology  . POCT urinalysis dipstick  . POCT Microscopic Urinalysis ([facility])   Meds ordered this encounter  Medications  . DISCONTD: ciprofloxacin (CIPRO) 250 MG tablet    Sig: Take 1 tablet (250 mg total) by mouth 2 (two) times daily.    Dispense:  6 tablet    Refill:  0  . traMADol  (ULTRAM) 50 MG tablet    Sig: Take 1 tablet (50 mg total) by mouth every 8 (eight) hours as needed for up to 5 days.    Dispense:  15 tablet    Refill:  0    She was referred to GI urgently and will f/u in 1 week pending appointment. Patient is inline with that plan.   Glyn Ade, NP

## 2020-04-06 NOTE — Patient Instructions (Signed)
° ° ° °  If you have lab work done today you will be contacted with your lab results within the next 2 weeks.  If you have not heard from us then please contact us. The fastest way to get your results is to register for My Chart. ° ° °IF you received an x-ray today, you will receive an invoice from [facility]. Please contact [facility] at [phone] with questions or concerns regarding your invoice.  ° °IF you received labwork today, you will receive an invoice from [facility]. Please contact [facility] at [phone] with questions or concerns regarding your invoice.  ° °Our billing staff will not be able to assist you with questions regarding bills from these companies. ° °You will be contacted with the lab results as soon as they are available. The fastest way to get your results is to activate your My Chart account. Instructions are located on the last page of this paperwork. If you have not heard from us regarding the results in 2 weeks, please contact this office. °  ° ° ° °

## 2020-04-09 ENCOUNTER — Encounter: Payer: Self-pay | Admitting: Gastroenterology

## 2020-04-09 ENCOUNTER — Other Ambulatory Visit: Payer: Self-pay

## 2020-04-09 ENCOUNTER — Ambulatory Visit (AMBULATORY_SURGERY_CENTER): Payer: BC Managed Care – PPO | Admitting: Gastroenterology

## 2020-04-09 VITALS — BP 115/67 | HR 76 | Temp 98.2°F | Resp 18 | Ht 65.0 in | Wt 227.0 lb

## 2020-04-09 DIAGNOSIS — K648 Other hemorrhoids: Secondary | ICD-10-CM

## 2020-04-09 DIAGNOSIS — R1031 Right lower quadrant pain: Secondary | ICD-10-CM | POA: Diagnosis present

## 2020-04-09 DIAGNOSIS — K5730 Diverticulosis of large intestine without perforation or abscess without bleeding: Secondary | ICD-10-CM

## 2020-04-09 MED ORDER — SODIUM CHLORIDE 0.9 % IV SOLN: 500.0000 mL | Freq: Once | INTRAVENOUS | Status: DC

## 2020-04-09 NOTE — Op Note (Signed)
[facility] Patient Name: Alexandra Hood Procedure Date: 04/09/2020 8:33 AM MRN: [id number] Endoscopist: Mauri Pole , MD Age: 49 Referring MD:  Date of Birth: October 03, 1971 Gender: Female Account #: [id number] Procedure:                Colonoscopy Indications:              Abdominal pain in the right lower quadrant Medicines:                Monitored Anesthesia Care Procedure:                Pre-Anesthesia Assessment:                           - Prior to the procedure, a History and Physical                            was performed, and patient medications and                            allergies were reviewed. The patient's tolerance of                            previous anesthesia was also reviewed. The risks                            and benefits of the procedure and the sedation                            options and risks were discussed with the patient.                            All questions were answered, and informed consent                            was obtained. Prior Anticoagulants: The patient has                            taken no previous anticoagulant or antiplatelet                            agents. ASA Grade Assessment: II - A patient with                            mild systemic disease. After reviewing the risks                            and benefits, the patient was deemed in                            satisfactory condition to undergo the procedure.                           After obtaining informed consent, the colonoscope  was passed under direct vision. Throughout the                            procedure, the patient's blood pressure, pulse, and                            oxygen saturations were monitored continuously. The                            Colonoscope was introduced through the anus and                            advanced to the the terminal ileum, with                            identification  of the appendiceal orifice and IC                            valve. The colonoscopy was performed without                            difficulty. The patient tolerated the procedure                            well. The quality of the bowel preparation was                            excellent. The terminal ileum, ileocecal valve,                            appendiceal orifice, and rectum were photographed. Scope In: 8:41:09 AM Scope Out: 8:52:29 AM Scope Withdrawal Time: 0 hours 6 minutes 57 seconds  Total Procedure Duration: 0 hours 11 minutes 20 seconds  Findings:                 The perianal and digital rectal examinations were                            normal.                           A few small-mouthed diverticula were found in the                            sigmoid colon.                           Non-bleeding internal hemorrhoids were found during                            retroflexion. The hemorrhoids were small.                           The exam was otherwise without abnormality. Complications:            No immediate complications. Estimated Blood  Loss:     Estimated blood loss: none. Impression:               - Diverticulosis in the sigmoid colon.                           - Non-bleeding internal hemorrhoids.                           - The examination was otherwise normal.                           - No specimens collected. Recommendation:           - Patient has a contact number available for                            emergencies. The signs and symptoms of potential                            delayed complications were discussed with the                            patient. Return to normal activities tomorrow.                            Written discharge instructions were provided to the                            patient.                           - Resume previous diet.                           - Continue present medications.                           - Repeat  colonoscopy in 10 years for screening                            purposes. Mauri Pole, MD 04/09/2020 8:56:32 AM This report has been signed electronically.

## 2020-04-09 NOTE — Patient Instructions (Signed)
YOU HAD AN ENDOSCOPIC PROCEDURE TODAY AT THE [facility]:   Refer to the procedure report that was given to you for any specific questions about what was found during the examination.  If the procedure report does not answer your questions, please call your gastroenterologist to clarify.  If you requested that your care partner not be given the details of your procedure findings, then the procedure report has been included in a sealed envelope for you to review at your convenience later.  YOU SHOULD EXPECT: Some feelings of bloating in the abdomen. Passage of more gas than usual.  Walking can help get rid of the air that was put into your GI tract during the procedure and reduce the bloating. If you had a lower endoscopy (such as a colonoscopy or flexible sigmoidoscopy) you may notice spotting of blood in your stool or on the toilet paper. If you underwent a bowel prep for your procedure, you may not have a normal bowel movement for a few days.  Please Note:  You might notice some irritation and congestion in your nose or some drainage.  This is from the oxygen used during your procedure.  There is no need for concern and it should clear up in a day or so.  SYMPTOMS TO REPORT IMMEDIATELY:   Following lower endoscopy (colonoscopy or flexible sigmoidoscopy):  Excessive amounts of blood in the stool  Significant tenderness or worsening of abdominal pains  Swelling of the abdomen that is new, acute  Fever of 100F or higher  For urgent or emergent issues, a gastroenterologist can be reached at any hour by calling [phone]. Do not use MyChart messaging for urgent concerns.    DIET:  We do recommend a small meal at first, but then you may proceed to your regular diet.  Drink plenty of fluids but you should avoid alcoholic beverages for 24 hours.  ACTIVITY:  You should plan to take it easy for the rest of today and you should NOT DRIVE or use heavy machinery until tomorrow (because  of the sedation medicines used during the test).    FOLLOW UP: Our staff will call the number listed on your records 48-72 hours following your procedure to check on you and address any questions or concerns that you may have regarding the information given to you following your procedure. If we do not reach you, we will leave a message.  We will attempt to reach you two times.  During this call, we will ask if you have developed any symptoms of COVID 19. If you develop any symptoms (ie: fever, flu-like symptoms, shortness of breath, cough etc.) before then, please call [phone].  If you test positive for Covid 19 in the 2 weeks post procedure, please call and report this information to us.    If any biopsies were taken you will be contacted by phone or by letter within the next 1-3 weeks.  Please call us at [phone] if you have not heard about the biopsies in 3 weeks.    SIGNATURES/CONFIDENTIALITY: You and/or your care partner have signed paperwork which will be entered into your electronic medical record.  These signatures attest to the fact that that the information above on your After Visit Summary has been reviewed and is understood.  Full responsibility of the confidentiality of this discharge information lies with you and/or your care-partner. 

## 2020-04-09 NOTE — Progress Notes (Signed)
pt tolerated well. VSS. awake and to recovery. Report given to RN.  

## 2020-04-09 NOTE — Progress Notes (Signed)
Temp by Ledora Bottcher by KA  Pt's states no medical or surgical changes since previsit or office visit.

## 2020-04-12 NOTE — Progress Notes (Signed)
History   Chief Complaint  Patient presents with  . R lower quadrant , abd , back pain    worsened with activity    HPI   Pain in right lower quadrant continues.  Feels she is having trouble breathing secondary to abdominal pain/bloating.  Has an appointment for colonoscoy this week.    Past Medical History:  Diagnosis Date  . Headache   . Lupus pernio    -skin bx confirmed at [facility] 05/07/2009 (personally reviewed) -looks improved 07/09/2009 -  . Pertussis   . Pseudotumor cerebri    .Marland KitchenMarland KitchenDr Sabra Heck and Dr. Katy Fitch - dxed Jan 2009, on diamox  - LP 01/24/2008 at [facility] - Normal ( pressure 14cm, gluc 68, Protein 23, Clear, colorless, RBC 3, WBC - too few to count)  - CT head non contrast done at [facility] 11/25/2007: "normal". Ddoes not report MRI, MRA, MRV being done - 2nd opinon by Dr. Leonie Man - reportdly had MRI/MRV both normal; followup pending  . Pulmonary sarcoidosis ([place])    - Spiro 07/25/2010 on pred 10/mtx 10 -> FEv1 1.84L/775%, FVC 2.25L - SPiro 09/06/2010 -> Fev1 2.14L/81%. Cut pred to 5mg  per day. Cont mtx - SPiro 01/20/2011 - Fev1 1.6L/61%, Ratio 74%. CXR Worse -> increased pred to 10mg  perday. Cont Mtx.    . Ventricular tachycardia ([place])   . Vitamin D deficiency    - Aug 2011: 22 and low -> commence Rx. - Jan 2012: 11   Past Surgical History:  Procedure Laterality Date  . CARDIAC DEFIBRILLATOR PLACEMENT  march 2012   [employer]tr[employer]  . ESOPHAGOGASTRODUODENOSCOPY (EGD) WITH PROPOFOL N/A 02/25/2016   Procedure: ESOPHAGOGASTRODUODENOSCOPY (EGD) WITH PROPOFOL;  Surgeon: Laurence Spates, MD;  Location: [facility];  Service: Endoscopy;  Laterality: N/A;  . ICD GENERATOR CHANGEOUT N/A 09/06/2018   Procedure: ICD GENERATOR CHANGEOUT;  Surgeon: Deboraha Sprang, MD;  Location: [place] [facility];  Service: Cardiovascular;  Laterality: N/A;  . 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 Allergies Mother   . Diabetes Mother   .  Hypertension Mother   . Hyperlipidemia Mother   . Migraines Mother   . Hyperlipidemia Father   . Hypertension Father   . Diabetes Father   . Hyperlipidemia Sister   . Hypertension Sister   . Meniere's disease Sister   . COPD Maternal Grandfather   . Leukemia Paternal Grandmother   . Heart attack Paternal Grandfather   . Colon cancer Neg Hx   . Esophageal cancer Neg Hx   . Rectal cancer Neg Hx   . Stomach cancer Neg Hx    Social History   Tobacco Use  . Smoking status: Former Smoker    Packs/day: 1.00    Years: 15.00    Pack years: 15.00    Types: Cigarettes    Quit date: 03/03/2007    Years since quitting: 13.1  . Smokeless tobacco: Never Used  Substance Use Topics  . Alcohol use: No    Alcohol/week: 0.0 standard drinks  . Drug use: No   OB History   No obstetric history on file.    Review of Systems  Constitutional: Positive for fatigue.  Respiratory: Positive for shortness of breath.   Gastrointestinal: Positive for abdominal distention, abdominal pain and nausea.  Genitourinary: Positive for flank pain.    Allergies   Patient has no known allergies.  Home Medications    Current Outpatient Medications:  .  Diclofenac Potassium (CAMBIA) 50 MG PACK, Take 50 mg by mouth as directed. Mix in 2-3 oz of water. Limit of 1 per 24 hrs, Disp: 9 each, Rfl: 6 .  etodolac (LODINE) 300 MG capsule, Take 1 capsule (300 mg total) by mouth every 8 (eight) hours., Disp: 21 capsule, Rfl: 0 .  Galcanezumab-gnlm (EMGALITY) 120 MG/ML SOSY, Inject 120 mg into the skin every 30 (thirty) days., Disp: 1 mL, Rfl: 11 .  hyoscyamine (LEVSIN SL) 0.125 MG SL tablet, Place 1 tablet (0.125 mg total) under the tongue every 4 (four) hours as needed., Disp: 30 tablet, Rfl: 3 .  ibuprofen (ADVIL,MOTRIN) 200 MG tablet, Take 200-600 mg by mouth daily as needed for headache or moderate pain., Disp: , Rfl:  .  Multiple Vitamin (MULTIVITAMIN WITH MINERALS) TABS tablet, Take 1 tablet by mouth daily.,  Disp: , Rfl:  .  NURTEC 75 MG TBDP, DISSOLVE 1 TABLET ON THE TONGUE DAILY AS NEEDED, Disp: 8 tablet, Rfl: 6 .  topiramate (TOPAMAX) 100 MG tablet, Take 1 tablet (100 mg total) by mouth at bedtime., Disp: 30 tablet, Rfl: 3 .  albuterol (VENTOLIN HFA) 108 (90 Base) MCG/ACT inhaler, Inhale 2 puffs into the lungs every 6 (six) hours as needed for wheezing or shortness of breath., Disp: 8 g, Rfl: 1 .  beclomethasone (QVAR REDIHALER) 80 MCG/ACT inhaler, Inhale 2 puffs into the lungs 2 (two) times daily for 30 doses. (Patient not taking: Reported on 04/09/2020), Disp: 10.6 g, Rfl: 3 .  Galcanezumab-gnlm (EMGALITY, 300 MG DOSE, [place]), , Disp: , Rfl:   Meds Ordered and Administered this Visit   Meds ordered this encounter  Medications  . beclomethasone (QVAR REDIHALER) 80 MCG/ACT inhaler    Sig: Inhale 2 puffs into the lungs 2 (two) times daily for 30 doses.    Dispense:  10.6 g    Refill:  3  . albuterol (VENTOLIN HFA) 108 (90 Base) MCG/ACT inhaler    Sig: Inhale 2 puffs into the lungs every 6 (six) hours as needed for wheezing or shortness of breath.    Dispense:  8 g    Refill:  1    BP 131/82   Pulse 91   Temp 98.2 F (36.8 C)   Ht 5\' 5"  (1.651 m)   Wt 227 lb (103 kg)   LMP 12/25/2006   SpO2 97%   BMI 37.77 kg/m   Physical Exam General appearance: alert, well appearing, and in no distress, oriented to person, place, and time and anxious. Chest: clear to auscultation, no wheezes, rales or rhonchi, symmetric air entry.  CVS exam: normal rate, regular rhythm, normal S1, S2, no murmurs, rubs, clicks or gallops. Skin exam - normal coloration and turgor, no rashes, no suspicious skin lesions noted. Mental Status: normal mood, behavior, speech, dress, motor activity, and thought processes, anxious.   MDM    1. Right lower quadrant abdominal tenderness with rebound tenderness   2. Pulmonary sarcoidosis ([place])    No orders of the defined types were placed in this encounter.  Meds  ordered this encounter  Medications  . beclomethasone (QVAR REDIHALER) 80 MCG/ACT inhaler    Sig: Inhale 2 puffs into the lungs 2 (two) times daily for 30 doses.    Dispense:  10.6 g    Refill:  3  . albuterol (VENTOLIN HFA) 108 (90 Base) MCG/ACT inhaler    Sig: Inhale 2 puffs into the lungs every 6 (six) hours as needed for wheezing or shortness of breath.  Dispense:  8 g    Refill:  1

## 2020-04-13 ENCOUNTER — Telehealth: Payer: Self-pay

## 2020-04-13 NOTE — Telephone Encounter (Signed)
  Follow up Call-  Call back number 04/09/2020  Post procedure Call Back phone  # [phone]  Permission to leave phone message Yes  Some recent data might be hidden     Patient questions:  Do you have a fever, pain , or abdominal swelling? No. Pain Score  0 *  Have you tolerated food without any problems? Yes.    Have you been able to return to your normal activities? Yes.    Do you have any questions about your discharge instructions: Diet   No. Medications  No. Follow up visit  No.  Do you have questions or concerns about your Care? No.  Actions: * If pain score is 4 or above: No action needed, pain <4.  1. Have you developed a fever since your procedure? no  2.   Have you had an respiratory symptoms (SOB or cough) since your procedure? no  3.   Have you tested positive for COVID 19 since your procedure no  4.   Have you had any family members/close contacts diagnosed with the COVID 19 since your procedure?  no   If yes to any of these questions please route to Joylene John, RN and Erenest Rasher, RN

## 2020-04-13 NOTE — Progress Notes (Signed)
Reviewed and agree with documentation and assessment and plan. K. Veena Reeanna Acri , MD   

## 2020-04-16 ENCOUNTER — Encounter: Payer: Self-pay | Admitting: Adult Health Nurse Practitioner

## 2020-04-16 ENCOUNTER — Ambulatory Visit: Payer: BC Managed Care – PPO | Admitting: Adult Health Nurse Practitioner

## 2020-04-16 ENCOUNTER — Ambulatory Visit (INDEPENDENT_AMBULATORY_CARE_PROVIDER_SITE_OTHER): Payer: BC Managed Care – PPO

## 2020-04-16 ENCOUNTER — Other Ambulatory Visit: Payer: Self-pay

## 2020-04-16 VITALS — BP 138/86 | HR 104 | Temp 98.1°F | Ht 65.0 in | Wt 227.8 lb

## 2020-04-16 DIAGNOSIS — M7061 Trochanteric bursitis, right hip: Secondary | ICD-10-CM

## 2020-04-16 DIAGNOSIS — M25552 Pain in left hip: Secondary | ICD-10-CM

## 2020-04-16 DIAGNOSIS — R1031 Right lower quadrant pain: Secondary | ICD-10-CM | POA: Diagnosis not present

## 2020-04-16 LAB — POCT URINALYSIS DIP (CLINITEK)
Bilirubin, UA: NEGATIVE
Glucose, UA: NEGATIVE mg/dL
Ketones, POC UA: NEGATIVE mg/dL
Leukocytes, UA: NEGATIVE
Nitrite, UA: NEGATIVE
POC PROTEIN,UA: NEGATIVE
Spec Grav, UA: 1.025 (ref 1.010–1.025)
Urobilinogen, UA: 0.2 E.U./dL
pH, UA: 5.5 (ref 5.0–8.0)

## 2020-04-16 LAB — POC MICROSCOPIC URINALYSIS (UMFC): Mucus: ABSENT

## 2020-04-16 MED ORDER — METHYLPREDNISOLONE ACETATE 80 MG/ML IJ SUSP
80.0000 mg | Freq: Once | INTRAMUSCULAR | Status: AC
  Administered 2020-04-16: 80 mg via INTRAMUSCULAR

## 2020-04-16 MED ORDER — TRAMADOL HCL 50 MG PO TABS: 50.0000 mg | ORAL_TABLET | Freq: Four times a day (QID) | ORAL | 0 refills | Status: AC | PRN

## 2020-04-16 MED ORDER — PREDNISONE 20 MG PO TABS: ORAL_TABLET | ORAL | 0 refills | Status: DC

## 2020-04-16 NOTE — Progress Notes (Signed)
Primary Care at [facility] [place], [place]  [address] Phone:  [phone]   Fax:  [phone]    History   Chief Complaint  Patient presents with  . Right lower quadrant abdominal tenderness with rebound tende    has not felt better     HPI   Patient is following up on lower right quadrant pain.  Saw GI an had colonoscopy which was negative for any abnormality.  GI specialist recommended considering hip pathology.   Initially's patient's symptoms were predominantly GI related with nausea, loss of appetite, some vomiting, and constipation.  In retrospect, this could have been the visceral, referring pain from the patient's hip.  She does have C-sign pain and today has difficulty ambulating with increased pain changing positions both from sitting to standing and sitting to lying.  Excruciating pain with log roll.  She has been able to eat but appetite has not fully returned.  Sitting is uncomfortable, particularly getting up from sitting.    Past Medical History:  Diagnosis Date  . Headache   . Lupus pernio    -skin bx confirmed at [facility] 05/07/2009 (personally reviewed) -looks improved 07/09/2009 -  . Pertussis   . Pseudotumor cerebri    .Marland KitchenMarland KitchenDr Sabra Heck and Dr. Katy Fitch - dxed Jan 2009, on diamox  - LP 01/24/2008 at [facility] - Normal ( pressure 14cm, gluc 68, Protein 23, Clear, colorless, RBC 3, WBC - too few to count)  - CT head non contrast done at [facility] 11/25/2007: "normal". Ddoes not report MRI, MRA, MRV being done - 2nd opinon by Dr. Leonie Man - reportdly had MRI/MRV both normal; followup pending  . Pulmonary sarcoidosis ([place])    - Spiro 07/25/2010 on pred 10/mtx 10 -> FEv1 1.84L/775%, FVC 2.25L - SPiro 09/06/2010 -> Fev1 2.14L/81%. Cut pred to 5mg  per day. Cont mtx - SPiro 01/20/2011 - Fev1 1.6L/61%, Ratio 74%. CXR Worse -> increased pred to 10mg  perday. Cont Mtx.    . Ventricular tachycardia ([place])   . Vitamin D deficiency    - Aug 2011: 22 and low ->  commence Rx. - Jan 2012: 11   Past Surgical History:  Procedure Laterality Date  . CARDIAC DEFIBRILLATOR PLACEMENT  march 2012   [employer]  . ESOPHAGOGASTRODUODENOSCOPY (EGD) WITH PROPOFOL N/A 02/25/2016   Procedure: ESOPHAGOGASTRODUODENOSCOPY (EGD) WITH PROPOFOL;  Surgeon: Laurence Spates, MD;  Location: [facility];  Service: Endoscopy;  Laterality: N/A;  . ICD GENERATOR CHANGEOUT N/A 09/06/2018   Procedure: ICD GENERATOR CHANGEOUT;  Surgeon: Deboraha Sprang, MD;  Location: [place] [facility];  Service: Cardiovascular;  Laterality: N/A;  . 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 Allergies Mother   . Diabetes Mother   . Hypertension Mother   . Hyperlipidemia Mother   . Migraines Mother   . Hyperlipidemia Father   . Hypertension Father   . Diabetes Father   . Hyperlipidemia Sister   . Hypertension Sister   . Meniere's disease Sister   . COPD Maternal Grandfather   . Leukemia Paternal Grandmother   . Heart attack Paternal Grandfather   . Colon cancer Neg Hx   . Esophageal cancer Neg Hx   . Rectal cancer Neg Hx   . Stomach cancer Neg Hx    Social History   Tobacco Use  . Smoking status: Former Smoker    Packs/day: 1.00    Years: 15.00    Pack  years: 15.00    Types: Cigarettes    Quit date: 03/03/2007    Years since quitting: 13.1  . Smokeless tobacco: Never Used  Substance Use Topics  . Alcohol use: No    Alcohol/week: 0.0 standard drinks  . Drug use: No   OB History   No obstetric history on file.    Review of Systems  Constitutional: Positive for activity change, appetite change and fatigue. Negative for chills.  Gastrointestinal: Positive for abdominal distention, abdominal pain, constipation and nausea. Negative for diarrhea and vomiting.  Genitourinary: Positive for pelvic pain. Negative for dysuria.  Musculoskeletal: Positive for gait problem and myalgias.  Neurological: Positive for weakness.    Psychiatric/Behavioral: Positive for sleep disturbance.    Allergies   Patient has no known allergies.  Home Medications    Current Outpatient Medications:  .  albuterol (VENTOLIN HFA) 108 (90 Base) MCG/ACT inhaler, Inhale 2 puffs into the lungs every 6 (six) hours as needed for wheezing or shortness of breath., Disp: 8 g, Rfl: 1 .  beclomethasone (QVAR REDIHALER) 80 MCG/ACT inhaler, Inhale 2 puffs into the lungs 2 (two) times daily for 30 doses., Disp: 10.6 g, Rfl: 3 .  Diclofenac Potassium (CAMBIA) 50 MG PACK, Take 50 mg by mouth as directed. Mix in 2-3 oz of water. Limit of 1 per 24 hrs, Disp: 9 each, Rfl: 6 .  etodolac (LODINE) 300 MG capsule, Take 1 capsule (300 mg total) by mouth every 8 (eight) hours., Disp: 21 capsule, Rfl: 0 .  Galcanezumab-gnlm (EMGALITY) 120 MG/ML SOSY, Inject 120 mg into the skin every 30 (thirty) days., Disp: 1 mL, Rfl: 11 .  Galcanezumab-gnlm (EMGALITY, 300 MG DOSE, [place]), , Disp: , Rfl:  .  hyoscyamine (LEVSIN SL) 0.125 MG SL tablet, Place 1 tablet (0.125 mg total) under the tongue every 4 (four) hours as needed., Disp: 30 tablet, Rfl: 3 .  ibuprofen (ADVIL,MOTRIN) 200 MG tablet, Take 200-600 mg by mouth daily as needed for headache or moderate pain., Disp: , Rfl:  .  Multiple Vitamin (MULTIVITAMIN WITH MINERALS) TABS tablet, Take 1 tablet by mouth daily., Disp: , Rfl:  .  NURTEC 75 MG TBDP, DISSOLVE 1 TABLET ON THE TONGUE DAILY AS NEEDED, Disp: 8 tablet, Rfl: 6 .  topiramate (TOPAMAX) 100 MG tablet, Take 1 tablet (100 mg total) by mouth at bedtime., Disp: 30 tablet, Rfl: 3  Meds Ordered and Administered this Visit  No orders of the defined types were placed in this encounter.   Imaging:  Called radiologist personally to review: Superior joint space narrowing on R hip xray.    BP 138/86   Pulse (!) 104   Temp 98.1 F (36.7 C) (Temporal)   Ht 5\' 5"  (1.651 m)   Wt 227 lb 12.8 oz (103.3 kg)   LMP 12/25/2006   SpO2 97%   BMI 37.91 kg/m   Physical  Exam Constitutional:      General: She is in acute distress.  Musculoskeletal:     Right hip: Tenderness and bony tenderness present. Decreased range of motion. Decreased strength.     Left hip: Decreased range of motion.       Legs:      MDM   1. Hip pain, acute, left   2. Trochanteric bursitis of right hip   3. Right lower quadrant abdominal pain     Orders Placed This Encounter  Procedures  . DG HIPS BILAT W OR W/O PELVIS 2V  . MR HIP RIGHT W WO  CONTRAST  . POCT URINALYSIS DIP (CLINITEK)  . POCT Microscopic Urinalysis ([facility])   A total of 55 minutes was dedicated to face-toface evaluation, review of xray, consulting specialist contacted by phone, labs reviewed, and diagnostic testing set up urgently for the future with urgent referral to Orthopaedics and change/refill of medication related to acute problem.

## 2020-04-16 NOTE — Patient Instructions (Signed)
° ° ° °  If you have lab work done today you will be contacted with your lab results within the next 2 weeks.  If you have not heard from us then please contact us. The fastest way to get your results is to register for My Chart. ° ° °IF you received an x-ray today, you will receive an invoice from [facility]. Please contact [facility] at [phone] with questions or concerns regarding your invoice.  ° °IF you received labwork today, you will receive an invoice from [facility]. Please contact [facility] at [phone] with questions or concerns regarding your invoice.  ° °Our billing staff will not be able to assist you with questions regarding bills from these companies. ° °You will be contacted with the lab results as soon as they are available. The fastest way to get your results is to activate your My Chart account. Instructions are located on the last page of this paperwork. If you have not heard from us regarding the results in 2 weeks, please contact this office. °  ° ° ° °

## 2020-04-20 DIAGNOSIS — M25552 Pain in left hip: Secondary | ICD-10-CM | POA: Insufficient documentation

## 2020-04-20 DIAGNOSIS — M7061 Trochanteric bursitis, right hip: Secondary | ICD-10-CM | POA: Insufficient documentation

## 2020-04-23 ENCOUNTER — Telehealth: Payer: Self-pay | Admitting: Adult Health Nurse Practitioner

## 2020-04-23 ENCOUNTER — Other Ambulatory Visit: Payer: Self-pay | Admitting: *Deleted

## 2020-04-23 ENCOUNTER — Other Ambulatory Visit: Payer: Self-pay

## 2020-04-23 ENCOUNTER — Ambulatory Visit: Payer: BC Managed Care – PPO | Admitting: Orthopaedic Surgery

## 2020-04-23 ENCOUNTER — Encounter: Payer: Self-pay | Admitting: Orthopaedic Surgery

## 2020-04-23 DIAGNOSIS — S73191A Other sprain of right hip, initial encounter: Secondary | ICD-10-CM | POA: Diagnosis not present

## 2020-04-23 DIAGNOSIS — S73192A Other sprain of left hip, initial encounter: Secondary | ICD-10-CM | POA: Diagnosis not present

## 2020-04-23 MED ORDER — HYOSCYAMINE SULFATE 0.125 MG SL SUBL: 0.1250 mg | SUBLINGUAL_TABLET | SUBLINGUAL | 3 refills | Status: DC | PRN

## 2020-04-23 NOTE — Progress Notes (Signed)
Office Visit Note   Patient: Alexandra Hood           Date of Birth: 1971/11/03           MRN: [id number] Visit Date: 04/23/2020              Requested by: Wendall Mola, NP [place],  [place] [address] PCP: Wendall Mola, NP   Assessment & Plan: Visit Diagnoses:  1. Tear of left acetabular labrum, initial encounter   2. Tear of right acetabular labrum, initial encounter     Plan: Impression is bilateral hip labral tears.  We will refer the patient to Dr. Junius Roads for ultrasound-guided cortisone injections to both hips.  She will follow up with [place] as needed.  Follow-Up Instructions: Return for f/u with Dr. Junius Roads for bilateral hip cortisone inj..   Orders:  No orders of the defined types were placed in this encounter.  No orders of the defined types were placed in this encounter.     Procedures: No procedures performed   Clinical Data: No additional findings.   Subjective: Chief Complaint  Patient presents with  . Right Hip - Pain  . Left Hip - Pain    HPI patient is a pleasant 49 year old who presents our clinic today with bilateral hip pain right greater than left.  She noticed this approximately 6 weeks ago without any known injury or change in activity.  Her pain has recently worsened.  The pain she has is primarily to the groin on both sides but occasionally into both sides of her lower back.  Pain is worse going from a seated to standing position as well as with walking.  She has been taking Vicodin and tramadol without significant relief of symptoms.  She does note an occasional tingling and numbness to the back of her thighs with sitting for a long period of time.  No bowel or bladder change and no saddle paresthesias.  No previous hip or lumbar pathology.  She did have previous hip x-rays which showed mild degenerative changes.  Of note, she does have sarcoidosis and takes chronic steroids.  Review of Systems as detailed in HPI.  All  others reviewed and are negative.   Objective: Vital Signs: LMP 12/25/2006   Physical Exam well-developed well-nourished female no acute distress.  Alert oriented x3.  Ortho Exam examination of both hips reveals a markedly positive logroll FADIR.  Minimally positive straight leg raise.  No focal weakness.  She does have increased pain with lumbar flexion.  No spinous or paraspinous tenderness.  She is neurovascular intact distally.  Specialty Comments:  No specialty comments available.  Imaging: No new imaging   PMFS History: Patient Active Problem List   Diagnosis Date Noted  . Tear of right acetabular labrum 04/24/2020  . Tear of left acetabular labrum 04/24/2020  . Trochanteric bursitis of right hip 04/20/2020  . Hip pain, acute, left 04/20/2020  . Leukocytosis 03/25/2020  . Dysuria 03/25/2020  . Right lower quadrant abdominal tenderness with rebound tenderness 03/25/2020  . Right lower quadrant abdominal pain 03/12/2020  . Alopecia 01/08/2019  . Papilledema of right eye 01/08/2019  . Migraine 01/07/2019  . Gross hematuria 06/15/2018  . Pertussis   . Flank pain 10/23/2016  . Multiple pulmonary nodules 08/24/2014  . Pulmonary sarcoidosis ([place]) 08/24/2014  . Sinus tachycardia 10/23/2013  . 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 Thrombocytosis ([place]) 03/06/2012  . Obesity 03/06/2012  . Pre-diabetes 03/06/2012  . 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  Keloid--ICD incision 04/25/2011  . Automatic implantable cardioverter-defibrillator in situ 04/18/2011  . DEPRESSION 02/15/2011  . Unspecified vitamin D deficiency 01/20/2011  . VENTRICULAR TACHYCARDIA 01/20/2011  . PSEUDOTUMOR CEREBRI 05/12/2009   Past Medical History:  Diagnosis Date  . Headache   . Lupus pernio    -skin bx confirmed at [facility] 05/07/2009 (personally reviewed) -looks improved 07/09/2009 -  . Pertussis   . Pseudotumor cerebri    .Marland KitchenMarland KitchenDr Sabra Heck and Dr. Katy Fitch - dxed Jan 2009, on diamox  - LP  01/24/2008 at [facility] - Normal ( pressure 14cm, gluc 68, Protein 23, Clear, colorless, RBC 3, WBC - too few to count)  - CT head non contrast done at [facility] 11/25/2007: "normal". Ddoes not report MRI, MRA, MRV being done - 2nd opinon by Dr. Leonie Man - reportdly had MRI/MRV both normal; followup pending  . Pulmonary sarcoidosis ([place])    - Spiro 07/25/2010 on pred 10/mtx 10 -> FEv1 1.84L/775%, FVC 2.25L - SPiro 09/06/2010 -> Fev1 2.14L/81%. Cut pred to 5mg  per day. Cont mtx - SPiro 01/20/2011 - Fev1 1.6L/61%, Ratio 74%. CXR Worse -> increased pred to 10mg  perday. Cont Mtx.    . Ventricular tachycardia ([place])   . Vitamin D deficiency    - Aug 2011: 22 and low -> commence Rx. - Jan 2012: 11    Family History  Problem Relation Age of Onset  . Allergies Mother   . Diabetes Mother   . Hypertension Mother   . Hyperlipidemia Mother   . Migraines Mother   . Hyperlipidemia Father   . Hypertension Father   . Diabetes Father   . Hyperlipidemia Sister   . Hypertension Sister   . Meniere's disease Sister   . COPD Maternal Grandfather   . Leukemia Paternal Grandmother   . Heart attack Paternal Grandfather   . Colon cancer Neg Hx   . Esophageal cancer Neg Hx   . Rectal cancer Neg Hx   . Stomach cancer Neg Hx     Past Surgical History:  Procedure Laterality Date  . CARDIAC DEFIBRILLATOR PLACEMENT  march 2012   Medtronic  . ESOPHAGOGASTRODUODENOSCOPY (EGD) WITH PROPOFOL N/A 02/25/2016   Procedure: ESOPHAGOGASTRODUODENOSCOPY (EGD) WITH PROPOFOL;  Surgeon: Laurence Spates, MD;  Location: [facility];  Service: Endoscopy;  Laterality: N/A;  . ICD GENERATOR CHANGEOUT N/A 09/06/2018   Procedure: ICD GENERATOR CHANGEOUT;  Surgeon: Deboraha Sprang, MD;  Location: [facility];  Service: Cardiovascular;  Laterality: N/A;  . 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 Occupation: school Information systems manager)  Tobacco Use  . Smoking status: Former Smoker     Packs/day: 1.00    Years: 15.00    Pack years: 15.00    Types: Cigarettes    Quit date: 03/03/2007    Years since quitting: 13.1  . Smokeless tobacco: Never Used  Substance and Sexual Activity  . Alcohol use: No    Alcohol/week: 0.0 standard drinks  . Drug use: No  . Sexual activity: Not Currently    Comment: bilateral oophorectomy

## 2020-04-23 NOTE — Telephone Encounter (Signed)
Aurora Mask for this pt has been declined by insurance.  Stating Does not meet medical necessity.   A courtesy provider review can be done by calling [phone] Plan number is [phone]

## 2020-04-24 DIAGNOSIS — S73192A Other sprain of left hip, initial encounter: Secondary | ICD-10-CM | POA: Insufficient documentation

## 2020-04-24 DIAGNOSIS — S73191A Other sprain of right hip, initial encounter: Secondary | ICD-10-CM | POA: Insufficient documentation

## 2020-04-30 ENCOUNTER — Other Ambulatory Visit: Payer: Self-pay

## 2020-04-30 ENCOUNTER — Ambulatory Visit: Payer: Self-pay

## 2020-04-30 ENCOUNTER — Ambulatory Visit: Payer: BC Managed Care – PPO | Admitting: Family Medicine

## 2020-04-30 ENCOUNTER — Encounter: Payer: Self-pay | Admitting: Family Medicine

## 2020-04-30 DIAGNOSIS — S73192A Other sprain of left hip, initial encounter: Secondary | ICD-10-CM | POA: Diagnosis not present

## 2020-04-30 DIAGNOSIS — S73191A Other sprain of right hip, initial encounter: Secondary | ICD-10-CM

## 2020-04-30 NOTE — Progress Notes (Signed)
Subjective: Patient is here for ultrasound-guided intra-articular bilateral hip injection.   Groin pain for 3 weeks.  Long-standing prednisone use for sarcoidosis.  Objective:  Very painful with passive IR on both sides, right greater than left.  Procedure: Ultrasound-guided bilateral hip injection: After sterile prep with Betadine, injected 8 cc 1% lidocaine without epinephrine and 40 mg methylprednisolone using a 22-gauge spinal needle, passing the needle through the iliofemoral ligament into the femoral head/neck junction.  Injectate seen filling both joint capsules.  Good relief during anesthetic phase.

## 2020-06-02 ENCOUNTER — Ambulatory Visit (INDEPENDENT_AMBULATORY_CARE_PROVIDER_SITE_OTHER): Payer: BC Managed Care – PPO

## 2020-06-02 ENCOUNTER — Ambulatory Visit: Payer: BC Managed Care – PPO | Admitting: Registered Nurse

## 2020-06-02 ENCOUNTER — Encounter: Payer: Self-pay | Admitting: Registered Nurse

## 2020-06-02 ENCOUNTER — Other Ambulatory Visit: Payer: Self-pay

## 2020-06-02 VITALS — BP 117/63 | HR 83 | Temp 97.7°F | Resp 18 | Ht 65.0 in | Wt 227.8 lb

## 2020-06-02 DIAGNOSIS — M25511 Pain in right shoulder: Secondary | ICD-10-CM

## 2020-06-02 DIAGNOSIS — W19XXXA Unspecified fall, initial encounter: Secondary | ICD-10-CM | POA: Diagnosis not present

## 2020-06-02 MED ORDER — TRAMADOL HCL 50 MG PO TABS: 50.0000 mg | ORAL_TABLET | Freq: Three times a day (TID) | ORAL | 0 refills | Status: AC | PRN

## 2020-06-02 MED ORDER — METHOCARBAMOL 500 MG PO TABS: 500.0000 mg | ORAL_TABLET | Freq: Four times a day (QID) | ORAL | 0 refills | Status: DC

## 2020-06-02 NOTE — Patient Instructions (Signed)
° ° ° °  If you have lab work done today you will be contacted with your lab results within the next 2 weeks.  If you have not heard from us then please contact us. The fastest way to get your results is to register for My Chart. ° ° °IF you received an x-ray today, you will receive an invoice from [facility]. Please contact [facility] at [phone] with questions or concerns regarding your invoice.  ° °IF you received labwork today, you will receive an invoice from [facility]. Please contact [facility] at [phone] with questions or concerns regarding your invoice.  ° °Our billing staff will not be able to assist you with questions regarding bills from these companies. ° °You will be contacted with the lab results as soon as they are available. The fastest way to get your results is to activate your My Chart account. Instructions are located on the last page of this paperwork. If you have not heard from us regarding the results in 2 weeks, please contact this office. °  ° ° ° °

## 2020-06-03 ENCOUNTER — Telehealth: Payer: Self-pay | Admitting: Registered Nurse

## 2020-06-03 NOTE — Telephone Encounter (Signed)
Pt is needing a script similar to methocarbamol (ROBAXIN) 500 MG tablet [[id number]]. Her insurance will not cover this script. Pt was seen by  Orland Mustard on 06/02/20. Pharmacy  [id number] - [address], [address] [facility] [address]  [place], [place] [address]  Phone:  [phone] Fax:  [phone]  DEA #:  [id number]   Please advise at [phone].

## 2020-06-03 NOTE — Telephone Encounter (Signed)
Pt is requesting another medication, insurance will not pay for robaxin 500mg . Pt was seen yesterday for fall

## 2020-06-09 ENCOUNTER — Encounter: Payer: Self-pay | Admitting: Neurology

## 2020-06-09 NOTE — Progress Notes (Addendum)
Nunzio Cobbs Key: [id number] - PA Case ID: [id number] Need help? Call us at [phone] Outcome Approvedon June 16 Your PA request has been approved. Additional information will be provided in the approval communication. (Message 1145) Drug Emgality 120MG /ML auto-injectors (migraine) Form [employer] Electronic PA Form [phone] NCPDP)  Per fax rec appr thru 06/09/21.

## 2020-06-24 ENCOUNTER — Ambulatory Visit (INDEPENDENT_AMBULATORY_CARE_PROVIDER_SITE_OTHER): Payer: BC Managed Care – PPO | Admitting: *Deleted

## 2020-06-24 DIAGNOSIS — I472 Ventricular tachycardia: Secondary | ICD-10-CM | POA: Diagnosis not present

## 2020-06-24 DIAGNOSIS — I4729 Other ventricular tachycardia: Secondary | ICD-10-CM

## 2020-06-25 LAB — CUP PACEART REMOTE DEVICE CHECK
Battery Remaining Longevity: 104 mo
Battery Voltage: 2.98 V
Brady Statistic AP VP Percent: 0 %
Brady Statistic AP VS Percent: 0.02 %
Brady Statistic AS VP Percent: 0.04 %
Brady Statistic AS VS Percent: 99.94 %
Brady Statistic RA Percent Paced: 0.02 %
Brady Statistic RV Percent Paced: 0.04 %
Date Time Interrogation Session: 20210702072405
HighPow Impedance: 46 Ohm
HighPow Impedance: 54 Ohm
Implantable Lead Implant Date: 20120308
Implantable Lead Implant Date: 20120308
Implantable Lead Location: 753859
Implantable Lead Location: 753860
Implantable Lead Model: 5076
Implantable Lead Model: 6947
Implantable Pulse Generator Implant Date: 20190913
Lead Channel Impedance Value: 285 Ohm
Lead Channel Impedance Value: 380 Ohm
Lead Channel Impedance Value: 437 Ohm
Lead Channel Pacing Threshold Amplitude: 0.5 V
Lead Channel Pacing Threshold Amplitude: 0.625 V
Lead Channel Pacing Threshold Pulse Width: 0.4 ms
Lead Channel Pacing Threshold Pulse Width: 0.4 ms
Lead Channel Sensing Intrinsic Amplitude: 2 mV
Lead Channel Sensing Intrinsic Amplitude: 2 mV
Lead Channel Sensing Intrinsic Amplitude: 5.5 mV
Lead Channel Sensing Intrinsic Amplitude: 5.5 mV
Lead Channel Setting Pacing Amplitude: 1.5 V
Lead Channel Setting Pacing Amplitude: 2 V
Lead Channel Setting Pacing Pulse Width: 0.4 ms
Lead Channel Setting Sensing Sensitivity: 0.45 mV

## 2020-06-29 NOTE — Progress Notes (Signed)
Remote ICD transmission.   

## 2020-07-14 NOTE — Progress Notes (Signed)
Cardiology Office Note Date:  07/15/2020  Patient ID:  Alexandra Hood, DOB 09/13/1971, MRN [id number] PCP:  Wendall Mola, NP  Cardiologist: Dr. Caryl Comes    Chief Complaint:  over due, TWOS  History of Present Illness: Alexandra Hood is a 49 y.o. female with history of pulmonary and cardiac sarcoid, VT, Icd Wears sunglasses because of pseudotumor cerebri   Cutaneous Lupus is listed in her problem list though she denies this  She comes in today to be seen for Dr. Caryl Comes, last seen by him Nov 2019, he noted: "Biggest complaint is pain at device site, esp medially   There is itching laterally  There is also tenderness over the clavicle which began after generator replacement.  Some what better  She has intentionally restricted her L arm motion" She had not had further VT, felt to be euvolemic Concerns with shoulder soreness are three.  1) infection b/c temporal association, 2) clavicular lesion 3) frozen shoulder the latter possibly related to positioning at the time of gen replacement Planned for CT (done without mention of any shoulder abnormalities)  She had a NSVT episode on her last remote that was TWOS  She is doing well.  She is a Animal nutritionist and on break right now. She denies any CP, SOB, DOE He has noted at night [articularly some quick flutters, no palpitations otherwise No near syncope or syncope She walks her dog and gets out on walks with her dad to keep him active as well.   Device information MDT dual chamber ICD, implanted 03/02/2011, gen change 2019  Past Medical History:  Diagnosis Date  . Headache   . Lupus pernio    -skin bx confirmed at [facility] 05/07/2009 (personally reviewed) -looks improved 07/09/2009 -  . Pertussis   . Pseudotumor cerebri    .Marland KitchenMarland KitchenDr Sabra Heck and Dr. Katy Fitch - dxed Jan 2009, on diamox  - LP 01/24/2008 at [facility] - Normal ( pressure 14cm, gluc 68, Protein 23, Clear, colorless, RBC 3, WBC - too few to count)  - CT  head non contrast done at [facility] 11/25/2007: "normal". Ddoes not report MRI, MRA, MRV being done - 2nd opinon by Dr. Leonie Man - reportdly had MRI/MRV both normal; followup pending  . Pulmonary sarcoidosis ([place])    - Spiro 07/25/2010 on pred 10/mtx 10 -> FEv1 1.84L/775%, FVC 2.25L - SPiro 09/06/2010 -> Fev1 2.14L/81%. Cut pred to 5mg  per day. Cont mtx - SPiro 01/20/2011 - Fev1 1.6L/61%, Ratio 74%. CXR Worse -> increased pred to 10mg  perday. Cont Mtx.    . Ventricular tachycardia ([place])   . Vitamin D deficiency    - Aug 2011: 22 and low -> commence Rx. - Jan 2012: 11    Past Surgical History:  Procedure Laterality Date  . CARDIAC DEFIBRILLATOR PLACEMENT  march 2012   Medtronic  . ESOPHAGOGASTRODUODENOSCOPY (EGD) WITH PROPOFOL N/A 02/25/2016   Procedure: ESOPHAGOGASTRODUODENOSCOPY (EGD) WITH PROPOFOL;  Surgeon: Laurence Spates, MD;  [facility];  Service: Endoscopy;  Laterality: N/A;  . ICD GENERATOR CHANGEOUT N/A 09/06/2018   Procedure: ICD GENERATOR CHANGEOUT;  Surgeon: Deboraha Sprang, MD;  Location: [facility];  Service: Cardiovascular;  Laterality: N/A;  . 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 albuterol (VENTOLIN HFA) 108 (90 Base) MCG/ACT inhaler Inhale 2 puffs into the lungs every 6 (six) hours as needed for wheezing or shortness of breath. 8 g 1  .  Galcanezumab-gnlm (EMGALITY) 120 MG/ML SOSY Inject 120 mg into the skin every 30 (thirty) days. 1 mL 11  . ibuprofen (ADVIL,MOTRIN) 200 MG tablet Take 200-600 mg by mouth daily as needed for headache or moderate pain.    . methocarbamol (ROBAXIN) 500 MG tablet Take 1 tablet (500 mg total) by mouth 4 (four) times daily. 60 tablet 0  . Multiple Vitamin (MULTIVITAMIN WITH MINERALS) TABS tablet Take 1 tablet by mouth daily.    . NURTEC 75 MG TBDP DISSOLVE 1 TABLET ON THE TONGUE DAILY AS NEEDED 8 tablet 6  . topiramate (TOPAMAX) 100 MG tablet Take 1 tablet (100 mg  total) by mouth at bedtime. 30 tablet 3   No current facility-administered medications for this visit.    Allergies:   Patient has no known allergies.   Social History:  The patient  reports that she quit smoking about 13 years ago. Her smoking use included cigarettes. She has a 15.00 pack-year smoking history. She has never used smokeless tobacco. She reports that she does not drink alcohol and does not use drugs.   Family History:  The patient's family history includes Allergies in her mother; COPD in her maternal grandfather; Diabetes in her father and mother; Heart attack in her paternal grandfather; Hyperlipidemia in her father, mother, and sister; Hypertension in her father, mother, and sister; Leukemia in her paternal grandmother; Meniere's disease in her sister; Migraines in her mother.  ROS:  Please see the history of present illness.  All other systems are reviewed and otherwise negative.   PHYSICAL EXAM:  VS:  BP 124/82   Pulse 85   Ht 5\' 5"  (1.651 m)   Wt 225 lb (102.1 kg)   LMP 12/25/2006   BMI 37.44 kg/m  BMI: Body mass index is 37.44 kg/m. Well nourished, well developed, in no acute distress  HEENT: normocephalic, atraumatic  Neck: no JVD, carotid bruits or masses Cardiac:  RRR; no significant murmurs, no rubs, or gallops Lungs: CTA b/l, no wheezing, rhonchi or rales  Abd: soft, nontender MS: no deformity or atrophy Ext: no edema  Skin: warm and dry, no rash Neuro:  No gross deficits appreciated Psych: euthymic mood, full affect  ICD site is stable, no tethering or discomfort   EKG:  Done today and reviewed by myself shows  SR 85bpm, no changes  ICD interrogation done today and reviewed by myself Battery and lead measurements are good 4 NSVT One is false with TWOS, one second duration, the other 3 are NSVT, very short SVT back in 2019 (48min)   02/13/2011: c/MRI IMPRESSION:  1. Normal LV size with mild global hypokinesis, EF 50%.   2. Normal RV size  and systolic function.   3. Nodular areas of mid wall and subepicardial delayed enhancement  in the mid inferior, mid anterior, and apical septal walls. This  pattern is not consistent with coronary disease and give clinical  history is highly suspicious for cardiac involvement by  sarcoidosis.    Recent Labs: 03/25/2020: ALT 19; BUN 10; Creatinine, Ser 0.70; Hemoglobin 13.0; Platelets 369; Potassium 4.4; Sodium 141; TSH 2.350  03/25/2020: Chol/HDL Ratio 5.6; Cholesterol, Total 247; HDL 44; LDL Chol Calc (NIH) 177; Triglycerides 143   CrCl cannot be calculated (Patient's most recent lab result is older than the maximum 21 days allowed.).   Wt Readings from Last 3 Encounters:  07/15/20 225 lb (102.1 kg)  06/02/20 227 lb 12.8 oz (103.3 kg)  04/16/20 227 lb 12.8 oz (103.3 kg)  Other studies reviewed: Additional studies/records reviewed today include: summarized above  ASSESSMENT AND PLAN:  1. Cardiac (and pulm) sarcoid 2. VT    Rare brief NSVT only 3. ICD 4. TWOS     Once 1 second     D/w Dr. Caryl Comes, no programming changes, follow via remotes   Disposition: F/u with remotes Q 33mo, in clinic in 1 year, sooner if needed  Current medicines are reviewed at length with the patient today.  The patient did not have any concerns regarding medicines.  Venetia Night, PA-C 07/15/2020 7:53 PM     [address] [place] [address] [phone] (office)  [phone] (fax)

## 2020-07-15 ENCOUNTER — Other Ambulatory Visit: Payer: Self-pay

## 2020-07-15 ENCOUNTER — Ambulatory Visit (INDEPENDENT_AMBULATORY_CARE_PROVIDER_SITE_OTHER): Payer: BC Managed Care – PPO | Admitting: Physician Assistant

## 2020-07-15 ENCOUNTER — Encounter: Payer: Self-pay | Admitting: Physician Assistant

## 2020-07-15 VITALS — BP 124/82 | HR 85 | Ht 65.0 in | Wt 225.0 lb

## 2020-07-15 DIAGNOSIS — I472 Ventricular tachycardia, unspecified: Secondary | ICD-10-CM

## 2020-07-15 DIAGNOSIS — I429 Cardiomyopathy, unspecified: Secondary | ICD-10-CM

## 2020-07-15 DIAGNOSIS — R000 Tachycardia, unspecified: Secondary | ICD-10-CM | POA: Diagnosis not present

## 2020-07-15 DIAGNOSIS — Z95810 Presence of automatic (implantable) cardiac defibrillator: Secondary | ICD-10-CM

## 2020-07-15 DIAGNOSIS — D8685 Sarcoid myocarditis: Secondary | ICD-10-CM | POA: Diagnosis not present

## 2020-07-15 NOTE — Patient Instructions (Signed)
Medication Instructions:   Your physician recommends that you continue on your current medications as directed. Please refer to the Current Medication list given to you today.  *If you need a refill on your cardiac medications before your next appointment, please call your pharmacy*   Lab Work: [place]   If you have labs (blood work) drawn today and your tests are completely normal, you will receive your results only by: Marland Kitchen MyChart Message (if you have MyChart) OR . A paper copy in the mail If you have any lab test that is abnormal or we need to change your treatment, we will call you to review the results.   Testing/Procedures: NONE ORDERED  TODAY   Follow-Up: At [facility], you and your health needs are our priority.  As part of our continuing mission to provide you with exceptional heart care, we have created designated Provider Care Teams.  These Care Teams include your primary Cardiologist (physician) and Advanced Practice Providers (APPs -  Physician Assistants and Nurse Practitioners) who all work together to provide you with the care you need, when you need it.  We recommend signing up for the patient portal called "MyChart".  Sign up information is provided on this After Visit Summary.  MyChart is used to connect with patients for Virtual Visits (Telemedicine).  Patients are able to view lab/test results, encounter notes, upcoming appointments, etc.  Non-urgent messages can be sent to your provider as well.   To learn more about what you can do with MyChart, go to [email or web address].    Your next appointment:   1 year(s)  The format for your next appointment:   In Person  Provider:   You may see Dr. Caryl Comes    Other Instructions

## 2020-07-16 ENCOUNTER — Encounter: Payer: Self-pay | Admitting: Neurology

## 2020-07-16 NOTE — Progress Notes (Addendum)
Subjective:    Alexandra Hood (Key: [id number]) Nurtec 75MG  dispersible tablets   Form [employer] Electronic PA Form (2017 NCPDP) Created 18 hours ago Sent to Plan 6 hours ago Plan Response 6 hours ago Submit Clinical Questions 6 hours ago Determination Favorable 3 minutes ago Message from [employer] Your PA request has been approved. Additional information will be provided in the approval communication. (Message 1145)  Fax received approval valid from 07/16/20 to 07/16/21.

## 2020-09-14 ENCOUNTER — Encounter: Payer: Self-pay | Admitting: Registered Nurse

## 2020-09-14 NOTE — Progress Notes (Signed)
Established Patient Office Visit  Subjective:  Patient ID: Alexandra Hood, female    DOB: 03-28-1971  Age: 49 y.o. MRN: [id number]  CC:  Chief Complaint  Patient presents with  . Fall    patient states she fell in her yard last thursday and ended up on concrete and some parts of her body in grass. She states she had already had hip problems so she guess her legs just gave out. Per patient her whole right side of her body is in servere pain from the neck down. Patient has tried ice, aleve. and tylenol nothing gives relief  . Referral    patient is in need of     HPI Alexandra Hood presents for mechanical fall  Hx of hip issues. Feels that her leg gave out, landed hard on her shoulder on concrete Having pain with all aspects of moving. Pain throughout whole body, worse is R shoulder. Has iced, rested, aleve, and tylenol. No relief  No neuropathic pain No cns symptoms No bruising No hx of clot  Past Medical History:  Diagnosis Date  . Headache   . Lupus pernio    -skin bx confirmed at [facility] 05/07/2009 (personally reviewed) -looks improved 07/09/2009 -  . Pertussis   . Pseudotumor cerebri    .Marland KitchenMarland KitchenDr Sabra Heck and Dr. Katy Fitch - dxed Jan 2009, on diamox  - LP 01/24/2008 at [facility] - Normal ( pressure 14cm, gluc 68, Protein 23, Clear, colorless, RBC 3, WBC - too few to count)  - CT head non contrast done at [facility] 11/25/2007: "normal". Ddoes not report MRI, MRA, MRV being done - 2nd opinon by Dr. Leonie Man - reportdly had MRI/MRV both normal; followup pending  . Pulmonary sarcoidosis ([place])    - Spiro 07/25/2010 on pred 10/mtx 10 -> FEv1 1.84L/775%, FVC 2.25L - SPiro 09/06/2010 -> Fev1 2.14L/81%. Cut pred to 5mg  per day. Cont mtx - SPiro 01/20/2011 - Fev1 1.6L/61%, Ratio 74%. CXR Worse -> increased pred to 10mg  perday. Cont Mtx.    . Ventricular tachycardia ([place])   . Vitamin D deficiency    - Aug 2011: 22 and low -> commence Rx. - Jan 2012: 11    Past Surgical  History:  Procedure Laterality Date  . CARDIAC DEFIBRILLATOR PLACEMENT  march 2012   [employer]  . ESOPHAGOGASTRODUODENOSCOPY (EGD) WITH PROPOFOL N/A 02/25/2016   Procedure: ESOPHAGOGASTRODUODENOSCOPY (EGD) WITH PROPOFOL;  Surgeon: Laurence Spates, MD;  Location: [facility];  Service: Endoscopy;  Laterality: N/A;  . ICD GENERATOR CHANGEOUT N/A 09/06/2018   Procedure: ICD GENERATOR CHANGEOUT;  Surgeon: Deboraha Sprang, MD;  Location: [facility];  Service: Cardiovascular;  Laterality: N/A;  . 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 Allergies Mother   . Diabetes Mother   . Hypertension Mother   . Hyperlipidemia Mother   . Migraines Mother   . Hyperlipidemia Father   . Hypertension Father   . Diabetes Father   . Hyperlipidemia Sister   . Hypertension Sister   . Meniere's disease Sister   . COPD Maternal Grandfather   . Leukemia Paternal Grandmother   . Heart attack Paternal Grandfather   . Colon cancer Neg Hx   . Esophageal cancer Neg Hx   . Rectal cancer Neg Hx   . Stomach cancer Neg Hx     Social History   Socioeconomic History  . Marital status: Legally Separated  Spouse name: Not on file  . Number of children: 1  . Years of education: Not on file  . Highest education level: Master's degree (e.g., MA, MS, MEng, MEd, MSW, MBA)  Occupational History  . Occupation: school Information systems manager)  Tobacco Use  . Smoking status: Former Smoker    Packs/day: 1.00    Years: 15.00    Pack years: 15.00    Types: Cigarettes    Quit date: 03/03/2007    Years since quitting: 13.5  . Smokeless tobacco: Never Used  Substance and Sexual Activity  . Alcohol use: No    Alcohol/week: 0.0 standard drinks  . Drug use: No  . Sexual activity: Not Currently    Comment: bilateral oophorectomy  Other Topics Concern  . 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 Wants letter       Patient is right-handed. She is separated, has one daughter. Lives in 2 story house, master on 1st. Drinks an occasional cup of coffee, 2 glasses of tea a day. Walks her dog daily.   Social Determinants of Health   Financial Resource Strain:   . Difficulty of Paying Living Expenses: Not on file  Food Insecurity:   . Worried About Charity fundraiser in the Last Year: Not on file  . Ran Out of Food in the Last Year: Not on file  Transportation Needs:   . Lack of Transportation (Medical): Not on file  . Lack of Transportation (Non-Medical): Not on file  Physical Activity:   . Days of Exercise per Week: Not on file  . Minutes of Exercise per Session: Not on file  Stress:   . Feeling of Stress : Not on file  Social Connections:   . Frequency of Communication with Friends and Family: Not on file  . Frequency of Social Gatherings with Friends and Family: Not on file  . Attends Religious Services: Not on file  . Active Member of Clubs or Organizations: Not on file  . Attends Archivist Meetings: Not on file  . Marital Status: Not on file  Intimate Partner Violence:   . Fear of Current or Ex-Partner: Not on file  . Emotionally Abused: Not on file  . Physically Abused: Not on file  . Sexually Abused: Not on file    Outpatient Medications Prior to Visit  Medication Sig Dispense Refill  . albuterol (VENTOLIN HFA) 108 (90 Base) MCG/ACT inhaler Inhale 2 puffs into the lungs every 6 (six) hours as needed for wheezing or shortness of breath. 8 g 1  . Galcanezumab-gnlm (EMGALITY) 120 MG/ML SOSY Inject 120 mg into the skin every 30 (thirty) days. 1 mL 11  . ibuprofen (ADVIL,MOTRIN) 200 MG tablet Take 200-600 mg by mouth daily as needed for headache or moderate pain.    . Multiple Vitamin (MULTIVITAMIN WITH MINERALS) TABS tablet Take 1 tablet by mouth daily.    . NURTEC 75 MG TBDP DISSOLVE 1 TABLET ON THE TONGUE DAILY AS NEEDED 8 tablet 6  .  topiramate (TOPAMAX) 100 MG tablet Take 1 tablet (100 mg total) by mouth at bedtime. 30 tablet 3  . Diclofenac Potassium (CAMBIA) 50 MG PACK Take 50 mg by mouth as directed. Mix in 2-3 oz of water. Limit of 1 per 24 hrs 9 each 6  . etodolac (LODINE) 300 MG capsule Take 1 capsule (300 mg total) by mouth every 8 (eight)  hours. 21 capsule 0  . hyoscyamine (LEVSIN SL) 0.125 MG SL tablet Place 1 tablet (0.125 mg total) under the tongue every 4 (four) hours as needed. 30 tablet 3   No facility-administered medications prior to visit.    No Known Allergies  ROS Review of Systems Per hpi    Objective:    Physical Exam Vitals and nursing note reviewed.  Constitutional:      General: She is not in acute distress.    Appearance: Normal appearance. She is normal weight. She is not ill-appearing, toxic-appearing or diaphoretic.  Cardiovascular:     Rate and Rhythm: Normal rate and regular rhythm.     Heart sounds: Normal heart sounds. No murmur heard.  No friction rub. No gallop.   Pulmonary:     Effort: Pulmonary effort is normal. No respiratory distress.     Breath sounds: Normal breath sounds. No stridor. No wheezing, rhonchi or rales.  Chest:     Chest wall: No tenderness.  Musculoskeletal:        General: Tenderness and signs of injury present. No swelling or deformity. Normal range of motion.     Right lower leg: No edema.     Left lower leg: No edema.     Comments: rom with pain in all aspects for R shoulder  Skin:    General: Skin is warm and dry.  Neurological:     General: No focal deficit present.     Mental Status: She is alert and oriented to person, place, and time. Mental status is at baseline.  Psychiatric:        Mood and Affect: Mood normal.        Behavior: Behavior normal.        Thought Content: Thought content normal.        Judgment: Judgment normal.     BP 117/63   Pulse 83   Temp 97.7 F (36.5 C) (Temporal)   Resp 18   Ht 5\' 5"  (1.651 m)   Wt 227 lb  12.8 oz (103.3 kg)   LMP 12/25/2006   SpO2 97%   BMI 37.91 kg/m  Wt Readings from Last 3 Encounters:  07/15/20 225 lb (102.1 kg)  06/02/20 227 lb 12.8 oz (103.3 kg)  04/16/20 227 lb 12.8 oz (103.3 kg)     Health Maintenance Due  Topic Date Due  . PAP SMEAR-Modifier  07/13/2019  . INFLUENZA VACCINE  07/25/2020    There are no preventive care reminders to display for this patient.  Lab Results  Component Value Date   TSH 2.350 03/25/2020   Lab Results  Component Value Date   WBC 7.7 03/25/2020   HGB 13.0 03/25/2020   HCT 40.5 03/25/2020   MCV 88 03/25/2020   PLT 369 03/25/2020   Lab Results  Component Value Date   NA 141 03/25/2020   K 4.4 03/25/2020   CO2 25 03/25/2020   GLUCOSE 111 (H) 03/25/2020   BUN 10 03/25/2020   CREATININE 0.70 03/25/2020   BILITOT <0.2 03/25/2020   ALKPHOS 114 03/25/2020   AST 19 03/25/2020   ALT 19 03/25/2020   PROT 6.9 03/25/2020   ALBUMIN 4.4 03/25/2020   CALCIUM 9.8 03/25/2020   ANIONGAP 8 03/12/2020   GFR 94.75 12/06/2011   Lab Results  Component Value Date   CHOL 247 (H) 03/25/2020   Lab Results  Component Value Date   HDL 44 03/25/2020   Lab Results  Component Value Date   LDLCALC  177 (H) 03/25/2020   Lab Results  Component Value Date   TRIG 143 03/25/2020   Lab Results  Component Value Date   CHOLHDL 5.6 (H) 03/25/2020   Lab Results  Component Value Date   HGBA1C 6.0 (H) 10/10/2017      Assessment & Plan:   Problem List Items Addressed This Visit    None    Visit Diagnoses    Fall, initial encounter    -  Primary   Relevant Orders   DG Shoulder Right (Completed)   Acute pain of right shoulder       Relevant Medications   methocarbamol (ROBAXIN) 500 MG tablet   Other Relevant Orders   DG Shoulder Right (Completed)      Meds ordered this encounter  Medications  . traMADol (ULTRAM) 50 MG tablet    Sig: Take 1 tablet (50 mg total) by mouth every 8 (eight) hours as needed for up to 10 days.     Dispense:  30 tablet    Refill:  0    Order Specific Question:   Supervising Provider    Answer:   Carlota Raspberry, JEFFREY R [[id number]]  . methocarbamol (ROBAXIN) 500 MG tablet    Sig: Take 1 tablet (500 mg total) by mouth 4 (four) times daily.    Dispense:  60 tablet    Refill:  0    Order Specific Question:   Supervising Provider    Answer:   Carlota Raspberry, JEFFREY R [[id number]]    Follow-up: No follow-ups on file.   PLAN  No acute abnormality of shoulder noted on xray  Will give course of tramadol and robaxin  Return prn  PT likely next step if warranted  Patient encouraged to call clinic with any questions, comments, or concerns.  Maximiano Coss, NP

## 2020-09-23 ENCOUNTER — Ambulatory Visit (INDEPENDENT_AMBULATORY_CARE_PROVIDER_SITE_OTHER): Payer: BC Managed Care – PPO

## 2020-09-23 DIAGNOSIS — I4729 Other ventricular tachycardia: Secondary | ICD-10-CM

## 2020-09-23 DIAGNOSIS — I472 Ventricular tachycardia: Secondary | ICD-10-CM | POA: Diagnosis not present

## 2020-09-23 LAB — CUP PACEART REMOTE DEVICE CHECK
Battery Remaining Longevity: 98 mo
Battery Voltage: 2.98 V
Brady Statistic AP VP Percent: 0 %
Brady Statistic AP VS Percent: 0.01 %
Brady Statistic AS VP Percent: 0.03 %
Brady Statistic AS VS Percent: 99.96 %
Brady Statistic RA Percent Paced: 0.01 %
Brady Statistic RV Percent Paced: 0.03 %
Date Time Interrogation Session: 20210930063326
HighPow Impedance: 45 Ohm
HighPow Impedance: 56 Ohm
Implantable Lead Implant Date: 20120308
Implantable Lead Implant Date: 20120308
Implantable Lead Location: 753859
Implantable Lead Location: 753860
Implantable Lead Model: 5076
Implantable Lead Model: 6947
Implantable Pulse Generator Implant Date: 20190913
Lead Channel Impedance Value: 266 Ohm
Lead Channel Impedance Value: 380 Ohm
Lead Channel Impedance Value: 456 Ohm
Lead Channel Pacing Threshold Amplitude: 0.5 V
Lead Channel Pacing Threshold Amplitude: 0.625 V
Lead Channel Pacing Threshold Pulse Width: 0.4 ms
Lead Channel Pacing Threshold Pulse Width: 0.4 ms
Lead Channel Sensing Intrinsic Amplitude: 2.125 mV
Lead Channel Sensing Intrinsic Amplitude: 2.125 mV
Lead Channel Sensing Intrinsic Amplitude: 5 mV
Lead Channel Sensing Intrinsic Amplitude: 5 mV
Lead Channel Setting Pacing Amplitude: 1.5 V
Lead Channel Setting Pacing Amplitude: 2 V
Lead Channel Setting Pacing Pulse Width: 0.4 ms
Lead Channel Setting Sensing Sensitivity: 0.45 mV

## 2020-09-27 NOTE — Progress Notes (Signed)
Remote ICD transmission.   

## 2020-11-07 ENCOUNTER — Other Ambulatory Visit: Payer: Self-pay | Admitting: Neurology

## 2020-11-07 DIAGNOSIS — G43409 Hemiplegic migraine, not intractable, without status migrainosus: Secondary | ICD-10-CM

## 2020-11-08 ENCOUNTER — Telehealth: Payer: Self-pay | Admitting: Neurology

## 2020-11-08 ENCOUNTER — Other Ambulatory Visit: Payer: Self-pay | Admitting: Neurology

## 2020-11-08 DIAGNOSIS — G43409 Hemiplegic migraine, not intractable, without status migrainosus: Secondary | ICD-10-CM

## 2020-11-08 MED ORDER — EMGALITY 120 MG/ML ~~LOC~~ SOSY: 120.0000 mg | PREFILLED_SYRINGE | SUBCUTANEOUS | 3 refills | Status: DC

## 2020-11-08 NOTE — Telephone Encounter (Signed)
Patient called in after pharmacy stated we wouldn't refill her Emgality. She is now scheduled for 02/08/21.

## 2020-12-23 ENCOUNTER — Ambulatory Visit (INDEPENDENT_AMBULATORY_CARE_PROVIDER_SITE_OTHER): Payer: BC Managed Care – PPO

## 2020-12-23 DIAGNOSIS — I472 Ventricular tachycardia, unspecified: Secondary | ICD-10-CM

## 2020-12-23 LAB — CUP PACEART REMOTE DEVICE CHECK
Battery Remaining Longevity: 93 mo
Battery Voltage: 2.98 V
Brady Statistic AP VP Percent: 0 %
Brady Statistic AP VS Percent: 0.01 %
Brady Statistic AS VP Percent: 0.03 %
Brady Statistic AS VS Percent: 99.95 %
Brady Statistic RA Percent Paced: 0.01 %
Brady Statistic RV Percent Paced: 0.03 %
Date Time Interrogation Session: 20211230033523
HighPow Impedance: 45 Ohm
HighPow Impedance: 53 Ohm
Implantable Lead Implant Date: 20120308
Implantable Lead Implant Date: 20120308
Implantable Lead Location: 753859
Implantable Lead Location: 753860
Implantable Lead Model: 5076
Implantable Lead Model: 6947
Implantable Pulse Generator Implant Date: 20190913
Lead Channel Impedance Value: 285 Ohm
Lead Channel Impedance Value: 380 Ohm
Lead Channel Impedance Value: 456 Ohm
Lead Channel Pacing Threshold Amplitude: 0.5 V
Lead Channel Pacing Threshold Amplitude: 0.875 V
Lead Channel Pacing Threshold Pulse Width: 0.4 ms
Lead Channel Pacing Threshold Pulse Width: 0.4 ms
Lead Channel Sensing Intrinsic Amplitude: 2 mV
Lead Channel Sensing Intrinsic Amplitude: 2 mV
Lead Channel Sensing Intrinsic Amplitude: 6 mV
Lead Channel Sensing Intrinsic Amplitude: 6 mV
Lead Channel Setting Pacing Amplitude: 1.5 V
Lead Channel Setting Pacing Amplitude: 2 V
Lead Channel Setting Pacing Pulse Width: 0.4 ms
Lead Channel Setting Sensing Sensitivity: 0.45 mV

## 2021-01-06 NOTE — Progress Notes (Signed)
Remote ICD transmission.   

## 2021-02-07 NOTE — Progress Notes (Signed)
NEUROLOGY FOLLOW UP OFFICE NOTE  Alexandra Hood [id number]   Subjective:  Alexandra Hood is a 50 year old African-American woman with sarcoidosis, ventricular tachycardia status post defibrillator, and history of idiopathic intracranial hypertension who follows up for migraines.  UPDATE: Intensity:  Severe Duration:  2 hours with Nurtec Frequency:  4 headache days a month on average when using Nurtec daily that last week. Pressure in the eyes. No longer has Cambia.  Now using Nurtec as the rescue and therefore doesn't have enough for the last week.  Therefore now has 8 to 10 a month.  She also has right posterior suboccipital/cervical neck pain.  Muscle relaxants cause too much drowsiness.  Robaxin not covered.  Went to her ophthalmologist on 01/28/2020.  Stable VF and OCT.  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    Preventative management:  Emgality monthly, topiramate 100mg  daily, Nurtec daily 8 days prior to [facility] injection. Rescue management:  Cambia Current NSAIDS:Cambia Current analgesics:Tylenol Current triptans:Contraindicated (ventricular tachycardia and stroke-like symptoms) Current ergotamine:no Current anti-emetic:no Current muscle relaxants:no Current anti-anxiolytic:no Current sleep aide:no Current Antihypertensive medications:no Current Antidepressant medications:no Current Anticonvulsant medications:topiramate 100mg  at bedtime (150mg  caused cognitive deficits) Current anti-CGRP:Emgality, Nurtec Current Vitamins/Herbal/Supplements:no Current Antihistamines/Decongestants:no Other therapy:no Hormone/Birth control:No  Caffeine:no Alcohol:no Smoker:no Diet:hydrates Exercise:walks Depression:no; Anxiety:no Other pain:Pain related to sarcoidosis Sleep hygiene:okay  HISTORY: She was diagnosed with pseudotumor cerebri in January 2009. 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 She underwent lumbar puncture on 01/24/08 which demonstrated normal opening pressure of 14 cm H2O with CSF glucose 68, protein 23 and 0 cell count. She apparently had an MRI and MRV of the head performed, which were reportedly normal. She was on acetazolamide for awhile, which was subsequently discontinued due to resolution of papilledema. She sees the ophthalmologist annually. Those headaches were dull right sided posterior headaches. She had a repeat eye exam with Dr. Katy Fitch in November 2019, which showed mild blurring of the nasal disc margin but nothing dramatic.   She began experiencing migraines around age 79. They are right frontal/temporal, non-throbbing and severe. They are associated with nausea, photophobia, phonophobia, osmophobia, but not vomiting. She sometimes sees white spots in her vision and has associated right upper and lower extremity numbness and weakness. They typically last 2 days and occur 2 to 3 times a monthbut have increased in frequency. No known triggers.  Relieved by rest.  Past NSAIDS: naproxen 550mg  (drowsiness), Advil Migraine, Sprix NS (ineffective and caused burning), Cambia (effective but expensive) Past analgesics: Tylenol, Excedrin Past abortive triptans: no Past muscle relaxants: Flexeril, tizanidine, Robaxin Past anti-emetic: Zofran ODT 4mg  Past antihypertensive medications:Toprol XL 25mg  Past antidepressant medications: no Past anticonvulsant medications: acetazolamide Past anti--CGRP: Aimovig (possible hair loss) Past vitamins/Herbal/Supplements: no Past antihistamines/decongestants: no Other past therapies: no  Family history of headache: mother  PAST MEDICAL HISTORY: Past Medical History:  Diagnosis Date  . Headache   . Lupus pernio    -skin bx confirmed at [facility] 05/07/2009 (personally reviewed) -looks improved 07/09/2009 -  . Pertussis   . Pseudotumor cerebri    .Marland KitchenMarland KitchenDr Sabra Heck and Dr.  Katy Fitch - dxed Jan 2009, on diamox  - LP 01/24/2008 at [facility] - Normal ( pressure 14cm, gluc 68, Protein 23, Clear, colorless, RBC 3, WBC - too few to count)  - CT head non contrast done at [facility] 11/25/2007: "normal". Ddoes not report MRI, MRA, MRV being done - 2nd opinon by Dr. Leonie Man - reportdly had MRI/MRV both normal; followup pending  .  Pulmonary sarcoidosis ([place])    - Spiro 07/25/2010 on pred 10/mtx 10 -> FEv1 1.84L/775%, FVC 2.25L - SPiro 09/06/2010 -> Fev1 2.14L/81%. Cut pred to 5mg  per day. Cont mtx - SPiro 01/20/2011 - Fev1 1.6L/61%, Ratio 74%. CXR Worse -> increased pred to 10mg  perday. Cont Mtx.    . Ventricular tachycardia ([place])   . Vitamin D deficiency    - Aug 2011: 22 and low -> commence Rx. - Jan 2012: 11    MEDICATIONS: Current Outpatient Medications on File Prior to Visit  Medication Sig Dispense Refill  . albuterol (VENTOLIN HFA) 108 (90 Base) MCG/ACT inhaler Inhale 2 puffs into the lungs every 6 (six) hours as needed for wheezing or shortness of breath. 8 g 1  . Galcanezumab-gnlm (EMGALITY) 120 MG/ML SOSY Inject 120 mg into the skin every 30 (thirty) days. 1 mL 3  . ibuprofen (ADVIL,MOTRIN) 200 MG tablet Take 200-600 mg by mouth daily as needed for headache or moderate pain.    . methocarbamol (ROBAXIN) 500 MG tablet Take 1 tablet (500 mg total) by mouth 4 (four) times daily. 60 tablet 0  . Multiple Vitamin (MULTIVITAMIN WITH MINERALS) TABS tablet Take 1 tablet by mouth daily.    . NURTEC 75 MG TBDP DISSOLVE 1 TABLET ON THE TONGUE DAILY AS NEEDED 8 tablet 6  . topiramate (TOPAMAX) 100 MG tablet Take 1 tablet (100 mg total) by mouth at bedtime. 30 tablet 3   No current facility-administered medications on file prior to visit.    ALLERGIES: No Known Allergies  FAMILY HISTORY: Family History  Problem Relation Age of Onset  . Allergies Mother   . Diabetes Mother   . Hypertension Mother   . Hyperlipidemia Mother   . Migraines Mother   . Hyperlipidemia Father   .  Hypertension Father   . Diabetes Father   . Hyperlipidemia Sister   . Hypertension Sister   . Meniere's disease Sister   . COPD Maternal Grandfather   . Leukemia Paternal Grandmother   . Heart attack Paternal Grandfather   . Colon cancer Neg Hx   . Esophageal cancer Neg Hx   . Rectal cancer Neg Hx   . Stomach cancer Neg Hx     SOCIAL HISTORY: Social History   Socioeconomic History  . Marital status: Legally Separated    Spouse name: Not on file  . Number of children: 1  . Years of education: Not on file  . Highest education level: Master's degree (e.g., MA, MS, MEng, MEd, MSW, MBA)  Occupational History  . Occupation: school Information systems manager)  Tobacco Use  . Smoking status: Former Smoker    Packs/day: 1.00    Years: 15.00    Pack years: 15.00    Types: Cigarettes    Quit date: 03/03/2007    Years since quitting: 13.9  . Smokeless tobacco: Never Used  Substance and Sexual Activity  . Alcohol use: No    Alcohol/week: 0.0 standard drinks  . Drug use: No  . Sexual activity: Not Currently    Comment: bilateral oophorectomy  Other Topics Concern  . 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 She is separated, has one daughter. Lives in 2 story house, master on 1st. Drinks an occasional cup of coffee, 2 glasses of tea a day. Walks her dog daily.   Social Determinants of  Health   Financial Resource Strain: Not on file  Food Insecurity: Not on file  Transportation Needs: Not on file  Physical Activity: Not on file  Stress: Not on file  Social Connections: Not on file  Intimate Partner Violence: Not on file     Objective:  Blood pressure 135/82, pulse 67, resp. rate 18, height 5\' 4"  (1.626 m), weight 227 lb (103 kg), last menstrual period 12/25/2006, SpO2 99 %. General: No acute distress.  Patient appears well-groomed.   Head:   Normocephalic/atraumatic Eyes:  Fundi examined but not visualized Neck: supple,  Right upper paraspinal tenderness, full range of motion Heart:  Regular rate and rhythm Lungs:  Clear to auscultation bilaterally Back: No paraspinal tenderness Neurological Exam: alert and oriented to person, place, and time. Attention span and concentration intact, recent and remote memory intact, fund of knowledge intact.  Speech fluent and not dysarthric, language intact.  CN II-XII intact. Bulk and tone normal, muscle strength 5/5 throughout.  Sensation to light touch, temperature and vibration intact.  Deep tendon reflexes 2+ throughout, toes downgoing.  Finger to nose and heel to shin testing intact.  Gait normal, Romberg negative.   Assessment/Plan:   1.  Migraine without aura, without status migrainosus, intractable 2.  Hemiplegic migraine 3.  Idiopathic intracranial hypertension 4.  Cervicalgia  1.  Migraine prevention:  In case some of these headaches are related to IIH, will stop topiramate and change to acetazolamide 250mg  twice daily.  Continue Emgality but take every 28 days.  Also Nurtec daily for last 8 days prior to next Emgality injection 2.  Migraine rescue:  No longer able to get Cambia.  Nurtec effective.  Will request quantity of 16 so she can also use as needed for migraines during the first 3 weeks of Emgality 3.  Limit use of pain relievers to no more than 2 days out of week to prevent risk of rebound or medication-overuse headache. 4.  Keep headache diary 5.  Refer to physical therapy for neck pain 6.  Follow up 4 to 6 months.  Metta Clines, DO  CC: Wendall Mola, NP

## 2021-02-08 ENCOUNTER — Other Ambulatory Visit: Payer: Self-pay

## 2021-02-08 ENCOUNTER — Ambulatory Visit: Payer: BC Managed Care – PPO | Admitting: Neurology

## 2021-02-08 ENCOUNTER — Encounter: Payer: Self-pay | Admitting: Neurology

## 2021-02-08 VITALS — BP 135/82 | HR 67 | Resp 18 | Ht 64.0 in | Wt 227.0 lb

## 2021-02-08 DIAGNOSIS — G932 Benign intracranial hypertension: Secondary | ICD-10-CM

## 2021-02-08 DIAGNOSIS — G43409 Hemiplegic migraine, not intractable, without status migrainosus: Secondary | ICD-10-CM | POA: Diagnosis not present

## 2021-02-08 DIAGNOSIS — G43109 Migraine with aura, not intractable, without status migrainosus: Secondary | ICD-10-CM | POA: Diagnosis not present

## 2021-02-08 DIAGNOSIS — M542 Cervicalgia: Secondary | ICD-10-CM

## 2021-02-08 MED ORDER — ACETAZOLAMIDE 250 MG PO TABS: 250.0000 mg | ORAL_TABLET | Freq: Two times a day (BID) | ORAL | 5 refills | Status: DC

## 2021-02-08 MED ORDER — NURTEC 75 MG PO TBDP: 75.0000 mg | ORAL_TABLET | Freq: Every day | ORAL | 5 refills | Status: DC | PRN

## 2021-02-08 NOTE — Patient Instructions (Addendum)
1.  Stop topiramate.  Start acetazolamide 250mg  twice daily. 2.  Continue Emgality every 28 days 3.  Refilled Nurtec for 16 tablets.  Take daily 8 days prior to next injection.  Use others as needed 4.  Refer to physical therapy for neck pain. 5.  Follow up in 4 to 6 months

## 2021-02-10 ENCOUNTER — Other Ambulatory Visit: Payer: Self-pay | Admitting: Neurology

## 2021-02-15 ENCOUNTER — Other Ambulatory Visit: Payer: Self-pay | Admitting: Neurology

## 2021-02-15 DIAGNOSIS — G43409 Hemiplegic migraine, not intractable, without status migrainosus: Secondary | ICD-10-CM

## 2021-02-21 ENCOUNTER — Ambulatory Visit: Payer: BC Managed Care – PPO | Attending: Neurology

## 2021-02-21 ENCOUNTER — Other Ambulatory Visit: Payer: Self-pay

## 2021-02-21 DIAGNOSIS — R293 Abnormal posture: Secondary | ICD-10-CM | POA: Insufficient documentation

## 2021-02-21 DIAGNOSIS — M542 Cervicalgia: Secondary | ICD-10-CM | POA: Insufficient documentation

## 2021-02-21 DIAGNOSIS — M25511 Pain in right shoulder: Secondary | ICD-10-CM | POA: Insufficient documentation

## 2021-02-21 DIAGNOSIS — M62838 Other muscle spasm: Secondary | ICD-10-CM | POA: Insufficient documentation

## 2021-02-21 DIAGNOSIS — M25611 Stiffness of right shoulder, not elsewhere classified: Secondary | ICD-10-CM | POA: Insufficient documentation
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
## 2021-03-02 ENCOUNTER — Other Ambulatory Visit: Payer: Self-pay

## 2021-03-02 ENCOUNTER — Ambulatory Visit: Payer: BC Managed Care – PPO | Attending: Neurology

## 2021-03-02 DIAGNOSIS — M25511 Pain in right shoulder: Secondary | ICD-10-CM | POA: Diagnosis present

## 2021-03-02 DIAGNOSIS — R293 Abnormal posture: Secondary | ICD-10-CM | POA: Diagnosis present

## 2021-03-02 DIAGNOSIS — M25611 Stiffness of right shoulder, not elsewhere classified: Secondary | ICD-10-CM | POA: Insufficient documentation

## 2021-03-02 DIAGNOSIS — M542 Cervicalgia: Secondary | ICD-10-CM | POA: Insufficient documentation

## 2021-03-02 DIAGNOSIS — M62838 Other muscle spasm: Secondary | ICD-10-CM | POA: Insufficient documentation

## 2021-03-02 NOTE — Therapy (Signed)
[place] [place], [place], [address] Phone: [phone]   Fax:  [phone]  Physical Therapy Treatment  Patient Details  Name: Alexandra Hood MRN: [id number] Date of Birth: [birth year] Referring Provider (PT): Metta Clines, DO Neurology   Encounter Date: 03/02/2021   PT End of Session - 03/02/21 1612    Visit Number 2    Number of Visits 9    Date for PT Re-Evaluation 04/18/21    Authorization Type [place] PPO    PT Start Time 3875    PT Stop Time 1610    PT Time Calculation (min) 40 min    Activity Tolerance Patient tolerated treatment well;Patient limited by pain    Behavior During Therapy [facility] for tasks assessed/performed           Past Medical History:  Diagnosis Date  . Headache   . Lupus pernio    -skin bx confirmed at [facility] 05/07/2009 (personally reviewed) -looks improved 07/09/2009 -  . Pertussis   . Pseudotumor cerebri    .Marland KitchenMarland KitchenDr Sabra Heck and Dr. Katy Fitch - dxed Jan 2009, on diamox  - LP 01/24/2008 at [facility] - Normal ( pressure 14cm, gluc 68, Protein 23, Clear, colorless, RBC 3, WBC - too few to count)  - CT head non contrast done at [facility] 11/25/2007: "normal". Ddoes not report MRI, MRA, MRV being done - 2nd opinon by Dr. Leonie Man - reportdly had MRI/MRV both normal; followup pending  . Pulmonary sarcoidosis ([place])    - Spiro 07/25/2010 on pred 10/mtx 10 -> FEv1 1.84L/775%, FVC 2.25L - SPiro 09/06/2010 -> Fev1 2.14L/81%. Cut pred to 5mg  per day. Cont mtx - SPiro 01/20/2011 - Fev1 1.6L/61%, Ratio 74%. CXR Worse -> increased pred to 10mg  perday. Cont Mtx.    . Ventricular tachycardia ([place])   . Vitamin D deficiency    - Aug 2011: 22 and low -> commence Rx. - Jan 2012: 11    Past Surgical History:  Procedure Laterality Date  . CARDIAC DEFIBRILLATOR PLACEMENT  march 2012   [employer]tronic  . ESOPHAGOGASTRODUODENOSCOPY (EGD) WITH PROPOFOL N/A 02/25/2016   Procedure:  ESOPHAGOGASTRODUODENOSCOPY (EGD) WITH PROPOFOL;  Surgeon: Laurence Spates, MD;  Location: [facility];  Service: Endoscopy;  Laterality: N/A;  . ICD GENERATOR CHANGEOUT N/A 09/06/2018   Procedure: ICD GENERATOR CHANGEOUT;  Surgeon: Deboraha Sprang, MD;  Location: [place] CV [facility];  Service: Cardiovascular;  Laterality: N/A;  . 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   Subjective Assessment - 03/02/21 1536    Subjective Less neck pain today, have been doing exercises 2 x a day    Pertinent History R frozen shoulder on the RIGHT, Migraines with pseudotumor- pain on RIGHT, Sarcoidosis cardiac and pulmonary, Pacemaker 2012*    Patient Stated Goals To be able to drive without pain, decrease pain and tension    Currently in Pain? Yes    Pain Score 3    about past 3 days   Pain Location Neck    Pain Orientation Right    Pain Descriptors / Indicators Tightness               [facility] Adult PT Treatment/Exercise - 03/02/21 1544      Posture/Postural Control   Posture/Postural Control Postural limitations    Postural Limitations Forward head;Rounded Shoulders      Exercises   Exercises Neck;Shoulder  Neck Exercises: Seated   Other Seated Exercise Cervical extension SNAGs x 10, Cervical rotation SNAGs B x 10 each      Neck Exercises: Supine   Other Supine Exercise chin nods x 5      Shoulder Exercises: Seated   Other Seated Exercises pendulums 2 x 30 seconds R shoulder in sitting      Shoulder Exercises: Standing   Other Standing Exercises Wall walk ABD x 10    Other Standing Exercises Pendulums x30 seconds at sink/counter for the RUE      Manual Therapy   Manual therapy comments STM suboccipitals, R temporalis, R proximal SCM, UT, paraspinals. R deltoid and anterior biceps, pec minor. R Pec minor stretch with MWM   - cervical rotation to the left. Multiple tender spots anterior and lateral shoulder, under distal clavicle. Gentle   distraction R shoulder relieved tension/pain.              PT Education - 03/02/21 1743    Education Details Added pendulums seated/ stadning, education on safe ice use for  shoulder 10-20 minutes    Person(s) Educated Patient    Methods Explanation;Demonstration;Handout    Comprehension Verbalized understanding;Returned demonstration            PT Short Term Goals - 02/21/21 1212      PT SHORT TERM GOAL #1   Title Independent with initial HEP    Time 2    Period Weeks    Status New    Target Date 03/07/21             PT Long Term Goals - 02/21/21 1212      PT LONG TERM GOAL #1   Title Independent with advanced HEP    Time 8    Period Weeks    Status New    Target Date 04/18/21      PT LONG TERM GOAL #2   Title Pt will attain full functional cervical ROM with </= 2/10 neck pain to facilitate ADLs, driving    Time 8    Period Weeks    Status New    Target Date 04/18/21      PT LONG TERM GOAL #3   Title Pt will demo full R shoulder ABD AROM symmetrical to left to facilitate completion of ADLs    Time 8    Period Weeks    Status New      PT LONG TERM GOAL #4   Title Pt will demo increased R grip strength greater than or equal ot L grip strength    Time 8    Period Weeks    Status New                 Plan - 03/02/21 1613    Clinical Impression Statement Emberli tolerated todays session nicely. Reports chin nods/retractions seem to dissipate neck pain and have been helpful in dissipating feeling of oncoming headache over past few days. Neck pain has overall decreased and right shoulder was much more irritable today. Session with focus on review of HEP with min cues required for performance. Given irritability of right shoulder pendulums were added to HEP today and pt reported good relief after completing them.    Personal Factors and Comorbidities Profession;Past/Current Experience;Time since onset of injury/illness/exacerbation;Comorbidity 2     Comorbidities Migraines with pseudotumor- pain on RIGHT, Sarcoidosis cardiac and pulmonary with pacemaker    Examination-Activity Limitations Reach Overhead;Caring for Others;Dressing;Hygiene/Grooming;Lift    Rehab Potential Good  PT Frequency 1x / week    PT Duration 8 weeks    PT Treatment/Interventions ADLs/Self Care Home Management;Cryotherapy;Electrical Stimulation;Iontophoresis 4mg /ml Dexamethasone;Moist Heat;Neuromuscular re-education;Therapeutic exercise;Therapeutic activities;Functional mobility training;Patient/family education;Manual techniques;Taping;Energy conservation    PT Next Visit Plan Reassess HEP. Progressive cervical and shoulder ROM and strengthening as tolerated. Manual and modalities as needed to decr muscle guarding, pain, and incr ms extensibility    PT Home Exercise Plan added seated/standing pendulums to HEP    Consulted and Agree with Plan of Care Patient           Patient will benefit from skilled therapeutic intervention in order to improve the following deficits and impairments:  Decreased range of motion,Increased fascial restricitons,Impaired UE functional use,Increased muscle spasms,Pain,Impaired flexibility,Improper body mechanics,Postural dysfunction,Decreased mobility,Decreased strength  Visit Diagnosis: Cervicalgia  Abnormal posture  Stiffness of right shoulder, not elsewhere classified  Acute pain of right shoulder  Other muscle spasm     Problem List Patient Active Problem List   Diagnosis Date Noted  . Tear of right acetabular labrum 04/24/2020  . Tear of left acetabular labrum 04/24/2020  . Trochanteric bursitis of right hip 04/20/2020  . Hip pain, acute, left 04/20/2020  . Leukocytosis 03/25/2020  . Dysuria 03/25/2020  . Right lower quadrant abdominal tenderness with rebound tenderness 03/25/2020  . Right lower quadrant abdominal pain 03/12/2020  . Alopecia 01/08/2019  . Papilledema of right eye 01/08/2019  . Migraine 01/07/2019   . Gross hematuria 06/15/2018  . Pertussis   . Flank pain 10/23/2016  . Multiple pulmonary nodules 08/24/2014  . Pulmonary sarcoidosis ([place]) 08/24/2014  . Sinus tachycardia 10/23/2013  . 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 Thrombocytosis 03/06/2012  . Obesity 03/06/2012  . Pre-diabetes 03/06/2012  . 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 Keloid--ICD incision 04/25/2011  . Automatic implantable cardioverter-defibrillator in situ 04/18/2011  . DEPRESSION 02/15/2011  . Unspecified vitamin D deficiency 01/20/2011  . VENTRICULAR TACHYCARDIA 01/20/2011  . PSEUDOTUMOR CEREBRI 05/12/2009    Hall Busing, PT, DPT 03/02/2021, 5:45 PM  [place] [place], [place], [address] Phone: [phone]   Fax:  [phone]  Name: Alexandra Hood MRN: [id number] Date of Birth: 1971-01-22

## 2021-03-10 ENCOUNTER — Other Ambulatory Visit: Payer: Self-pay

## 2021-03-10 ENCOUNTER — Ambulatory Visit: Payer: BC Managed Care – PPO | Admitting: Physical Therapy

## 2021-03-10 DIAGNOSIS — M25511 Pain in right shoulder: Secondary | ICD-10-CM

## 2021-03-10 DIAGNOSIS — M542 Cervicalgia: Secondary | ICD-10-CM

## 2021-03-10 DIAGNOSIS — M25611 Stiffness of right shoulder, not elsewhere classified: Secondary | ICD-10-CM

## 2021-03-10 NOTE — Therapy (Signed)
[address]. [place], [place], [address] Phone: [phone]   Fax:  [phone]  Physical Therapy Treatment  Patient Details  Name: Alexandra Hood MRN: [id number] Date of Birth: 1971/05/14 Referring Provider (PT): Metta Clines, DO Neurology   Encounter Date: 03/10/2021   PT End of Session - 03/10/21 1603    Visit Number 3    Number of Visits 9    Date for PT Re-Evaluation 04/18/21    PT Start Time 2751    PT Stop Time 7001    PT Time Calculation (min) 45 min           Past Medical History:  Diagnosis Date  . Headache   . Lupus pernio    -skin bx confirmed at [facility] D[facility]atology 05/07/2009 (personally reviewed) -looks improved 07/09/2009 -  . Pertussis   . Pseudotumor cerebri    .Marland KitchenMarland KitchenDr Sabra Heck and Dr. Katy Fitch - dxed Jan 2009, on diamox  - LP 01/24/2008 at [facility] - Normal ( pressure 14cm, gluc 68, Protein 23, Clear, colorless, RBC 3, WBC - too few to count)  - CT head non contrast done at [facility] Radiology 11/25/2007: "normal". Ddoes not report MRI, MRA, MRV being done - 2nd opinon by Dr. Leonie Man - reportdly had MRI/MRV both normal; followup pending  . Pulmonary sarcoidosis ([place])    - Spiro 07/25/2010 on pred 10/mtx 10 -> FEv1 1.84L/775%, FVC 2.25L - SPiro 09/06/2010 -> Fev1 2.14L/81%. Cut pred to 51m per day. Cont mtx - SPiro 01/20/2011 - Fev1 1.6L/61%, Ratio 74%. CXR Worse -> increased pred to 141mperday. Cont Mtx.    . Ventricular tachycardia ([place]  . Vitamin D deficiency    - Aug 2011: 22 and low -> commence Rx. - Jan 2012: 11    Past Surgical History:  Procedure Laterality Date  . CARDIAC DEFIBRILLATOR PLACEMENT  march 2012   [employer]tr[employer]  . ESOPHAGOGASTRODUODENOSCOPY (EGD) WITH PROPOFOL N/A 02/25/2016   Procedure: ESOPHAGOGASTRODUODENOSCOPY (EGD) WITH PROPOFOL;  Surgeon: JaLaurence SpatesMD;  [facility];  Service: Endoscopy;  Laterality: N/A;  . ICD GENERATOR CHANGEOUT N/A 09/06/2018   Procedure: ICD  GENERATOR CHANGEOUT;  Surgeon: KlDeboraha SprangMD;  [facility];  Service: Cardiovascular;  Laterality: N/A;  . 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   Subjective Assessment - 03/10/21 1533    Subjective HA are better, shld coming along    Currently in Pain? Yes    Pain Score 3     Pain Location Neck    Pain Orientation Right              [facility] PT Assessment - 03/10/21 0001      AROM   Right Shoulder Flexion 160 Degrees    Right Shoulder ABduction 155 Degrees    Right Shoulder Internal Rotation 50 Degrees    Right Shoulder External Rotation 90 Degrees                         [facility] Adult PT Treatment/Exercise - 03/10/21 0001      Neck Exercises: Supine   Other Supine Exercise head on ball cerv retraction and ROM with PTA assist to increase range and stretch      Shoulder Exercises: Standing   Internal Rotation Strengthening;Both;15 reps   3# cane   Extension Strengthening;Both;15 reps   3# cane  Shoulder Exercises: Pulleys   Other Pulley Exercises 15x flex,abd ER,IR and ext      Shoulder Exercises: ROM/Strengthening   UBE (Upper Arm Bike) L 2 2 fwd/2 back      Modalities   Modalities Moist Heat      Moist Heat Therapy   Number Minutes Moist Heat 10 Minutes    Moist Heat Location Cervical;Shoulder   after STW And DN     Manual Therapy   Manual Therapy Passive ROM    Manual therapy comments STM suboccipitals, UT, paraspinals.   deep TP trap and rhom so added DN   Passive ROM RT shld- all PROM WFLS minus IR tight and limited end range            Trigger Point Dry Needling - 03/10/21 0001    Consent Given? Yes    Education Handout Provided Yes    Muscles Treated Head and Neck Upper trapezius    Upper Trapezius Response Twitch reponse elicited;Palpable increased muscle length                  PT Short Term Goals - 03/10/21 1601      PT SHORT TERM GOAL #1   Title  Independent with initial HEP    Status Achieved             PT Long Term Goals - 02/21/21 1212      PT LONG TERM GOAL #1   Title Independent with advanced HEP    Time 8    Period Weeks    Status New    Target Date 04/18/21      PT LONG TERM GOAL #2   Title Pt will attain full functional cervical ROM with </= 2/10 neck pain to facilitate ADLs, driving    Time 8    Period Weeks    Status New    Target Date 04/18/21      PT LONG TERM GOAL #3   Title Pt will demo full R shoulder ABD AROM symmetrical to left to facilitate completion of ADLs    Time 8    Period Weeks    Status New      PT LONG TERM GOAL #4   Title Pt will demo increased R grip strength greater than or equal ot L grip strength    Time 8    Period Weeks    Status New                 Plan - 03/10/21 1601    Clinical Impression Statement STG met- doing HEP. pt has improved shld ROM, IR most limited act and pass- tolerated PROM well. during STW multi deep TP where noted so added DN to relieve tightness. pt verb HA are better overall since shld is doing better    PT Treatment/Interventions ADLs/Self Care Home Management;Cryotherapy;Electrical Stimulation;Iontophoresis 50m/ml Dexamethasone;Moist Heat;Neuromuscular re-education;Therapeutic exercise;Therapeutic activities;Functional mobility training;Patient/family education;Manual techniques;Taping;Energy conservation    PT Next Visit Plan Increase HEP with scap stab. Progressive cervical and shoulder ROM and strengthening as tolerated. Manual and modalities as needed to decr muscle guarding, pain, and incr ms extensibility           Patient will benefit from skilled therapeutic intervention in order to improve the following deficits and impairments:  Decreased range of motion,Increased fascial restricitons,Impaired UE functional use,Increased muscle spasms,Pain,Impaired flexibility,Improper body mechanics,Postural dysfunction,Decreased mobility,Decreased  strength  Visit Diagnosis: Cervicalgia  Stiffness of right shoulder, not elsewhere classified  Acute pain of right  shoulder     Problem List Patient Active Problem List   Diagnosis Date Noted  . Tear of right acetabular labrum 04/24/2020  . Tear of left acetabular labrum 04/24/2020  . Trochanteric bursitis of right hip 04/20/2020  . Hip pain, acute, left 04/20/2020  . Leukocytosis 03/25/2020  . Dysuria 03/25/2020  . Right lower quadrant abdominal tenderness with rebound tenderness 03/25/2020  . Right lower quadrant abdominal pain 03/12/2020  . Alopecia 01/08/2019  . Papilledema of right eye 01/08/2019  . Migraine 01/07/2019  . Gross hematuria 06/15/2018  . Pertussis   . Flank pain 10/23/2016  . Multiple pulmonary nodules 08/24/2014  . Pulmonary sarcoidosis ([place]) 08/24/2014  . Sinus tachycardia 10/23/2013  . 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 Thrombocytosis 03/06/2012  . Obesity 03/06/2012  . Pre-diabetes 03/06/2012  . 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 Keloid--ICD incision 04/25/2011  . Automatic implantable cardioverter-defibrillator in situ 04/18/2011  . DEPRESSION 02/15/2011  . Unspecified vitamin D deficiency 01/20/2011  . VENTRICULAR TACHYCARDIA 01/20/2011  . PSEUDOTUMOR CEREBRI 05/12/2009    Rosalie Gelpi,ANGIE PTA 03/10/2021, 4:16 PM  [address]. [place], [place], [address] Phone: [phone]   Fax:  [phone]  Name: Alexandra Hood MRN: [id number] Date of Birth: 1971/11/12

## 2021-03-10 NOTE — Patient Instructions (Signed)

## 2021-03-17 ENCOUNTER — Other Ambulatory Visit: Payer: Self-pay

## 2021-03-17 ENCOUNTER — Ambulatory Visit: Payer: BC Managed Care – PPO | Admitting: Physical Therapy

## 2021-03-17 DIAGNOSIS — M25611 Stiffness of right shoulder, not elsewhere classified: Secondary | ICD-10-CM

## 2021-03-17 DIAGNOSIS — M542 Cervicalgia: Secondary | ICD-10-CM | POA: Diagnosis not present

## 2021-03-17 NOTE — Therapy (Signed)
[address]. [place], [place], [address] Phone: [phone]   Fax:  [phone]  Physical Therapy Treatment  Patient Details  Name: Alexandra Hood MRN: [id number] Date of Birth: [birth year] Referring Provider (PT): Metta Clines, DO Neurology   Encounter Date: 03/17/2021   PT End of Session - 03/17/21 1606    Visit Number 4    Date for PT Re-Evaluation 04/18/21    PT Start Time 5638    PT Stop Time 1603    PT Time Calculation (min) 33 min           Past Medical History:  Diagnosis Date  . Headache   . Lupus pernio    -skin bx confirmed at [facility] Dermatology 05/07/2009 (personally reviewed) -looks improved 07/09/2009 -  . Pertussis   . Pseudotumor cerebri    .Marland KitchenMarland KitchenDr Sabra Heck and Dr. Katy Fitch - dxed Jan 2009, on diamox  - LP 01/24/2008 at [facility] - Normal ( pressure 14cm, gluc 68, Protein 23, Clear, colorless, RBC 3, WBC - too few to count)  - CT head non contrast done at [facility] Radiology 11/25/2007: "normal". Ddoes not report MRI, MRA, MRV being done - 2nd opinon by Dr. Leonie Man - reportdly had MRI/MRV both normal; followup pending  . Pulmonary sarcoidosis ([place])    - Spiro 07/25/2010 on pred 10/mtx 10 -> FEv1 1.84L/775%, FVC 2.25L - SPiro 09/06/2010 -> Fev1 2.14L/81%. Cut pred to 52m per day. Cont mtx - SPiro 01/20/2011 - Fev1 1.6L/61%, Ratio 74%. CXR Worse -> increased pred to 119mperday. Cont Mtx.    . Ventricular tachycardia ([place]  . Vitamin D deficiency    - Aug 2011: 22 and low -> commence Rx. - Jan 2012: 11    Past Surgical History:  Procedure Laterality Date  . CARDIAC DEFIBRILLATOR PLACEMENT  march 2012   [employer]tr[employer]  . ESOPHAGOGASTRODUODENOSCOPY (EGD) WITH PROPOFOL N/A 02/25/2016   Procedure: ESOPHAGOGASTRODUODENOSCOPY (EGD) WITH PROPOFOL;  Surgeon: JaLaurence SpatesMD;  [facility];  Service: Endoscopy;  Laterality: N/A;  . ICD GENERATOR CHANGEOUT N/A 09/06/2018   Procedure: ICD GENERATOR CHANGEOUT;   Surgeon: KlDeboraha SprangMD;  [facility];  Service: Cardiovascular;  Laterality: N/A;  . 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   Subjective Assessment - 03/17/21 1532    Subjective no HA. sore after DN but helped. using arm more    Currently in Pain? Yes    Pain Score 2     Pain Location Shoulder    Pain Orientation Right              [facility] PT Assessment - 03/17/21 0001      AROM   Right Shoulder Flexion 170 Degrees    Right Shoulder ABduction 160 Degrees    Right Shoulder Internal Rotation 70 Degrees    Right Shoulder External Rotation 90 Degrees                         [facility] Adult PT Treatment/Exercise - 03/17/21 0001      Shoulder Exercises: Supine   External Rotation Strengthening;Right;10 reps   3#   Internal Rotation Strengthening;Right;20 reps   3#   Flexion Strengthening;Right;10 reps   3#     Shoulder Exercises: Sidelying   External Rotation Strengthening;Right;10 reps   3#   ABduction Strengthening;Right;20 reps   3#  Shoulder Exercises: Standing   Protraction Strengthening;Both;15 reps   3# cane plus ext 15x     Shoulder Exercises: Pulleys   Other Pulley Exercises cabel pulleys ER 5# 10x IR 5# 10x      Shoulder Exercises: ROM/Strengthening   UBE (Upper Arm Bike) L 3 3 min fwd/3 min backwards    Lat Pull 20 reps   20#   Cybex Row 20 reps   20#     Manual Therapy   Manual Therapy Joint mobilization;Passive ROM    Joint Mobilization joint capsule    Passive ROM RT shld- all PROM WFLS minus IR tight and limited end range   responded well with IR after joint capsule stretching                 PT Education - 03/17/21 1603    Education Details 3# free wt flex/abd/IR/ER    Person(s) Educated Patient    Methods Explanation;Demonstration    Comprehension Verbalized understanding;Returned demonstration            PT Short Term Goals - 03/10/21 1601      PT  SHORT TERM GOAL #1   Title Independent with initial HEP    Status Achieved             PT Long Term Goals - 03/17/21 1604      PT LONG TERM GOAL #1   Title Independent with advanced HEP    Status Partially Met      PT LONG TERM GOAL #2   Title Pt will attain full functional cervical ROM with </= 2/10 neck pain to facilitate ADLs, driving    Status Partially Met      PT LONG TERM GOAL #3   Title Pt will demo full R shoulder ABD AROM symmetrical to left to facilitate completion of ADLs    Status Partially Met                 Plan - 03/17/21 1604    Clinical Impression Statement pt doing much better. AROM improving. PROM WNLs minus end range IR but after joint capsule stretching full IR. progressing with LTGS and increased HEP- tolerated wt intervenes well    PT Treatment/Interventions ADLs/Self Care Home Management;Cryotherapy;Electrical Stimulation;Iontophoresis 22m/ml Dexamethasone;Moist Heat;Neuromuscular re-education;Therapeutic exercise;Therapeutic activities;Functional mobility training;Patient/family education;Manual techniques;Taping;Energy conservation    PT Next Visit Plan assess and possible HOLD           Patient will benefit from skilled therapeutic intervention in order to improve the following deficits and impairments:  Decreased range of motion,Increased fascial restricitons,Impaired UE functional use,Increased muscle spasms,Pain,Impaired flexibility,Improper body mechanics,Postural dysfunction,Decreased mobility,Decreased strength  Visit Diagnosis: Stiffness of right shoulder, not elsewhere classified     Problem List Patient Active Problem List   Diagnosis Date Noted  . Tear of right acetabular labrum 04/24/2020  . Tear of left acetabular labrum 04/24/2020  . Trochanteric bursitis of right hip 04/20/2020  . Hip pain, acute, left 04/20/2020  . Leukocytosis 03/25/2020  . Dysuria 03/25/2020  . Right lower quadrant abdominal tenderness with  rebound tenderness 03/25/2020  . Right lower quadrant abdominal pain 03/12/2020  . Alopecia 01/08/2019  . Papilledema of right eye 01/08/2019  . Migraine 01/07/2019  . Gross hematuria 06/15/2018  . Pertussis   . Flank pain 10/23/2016  . Multiple pulmonary nodules 08/24/2014  . Pulmonary sarcoidosis ([place] 08/24/2014  . Sinus tachycardia 10/23/2013  . 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 Thrombocytosis 03/06/2012  . Obesity 03/06/2012  . Pre-diabetes  03/06/2012  . 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 Keloid--ICD incision 04/25/2011  . Automatic implantable cardioverter-defibrillator in situ 04/18/2011  . DEPRESSION 02/15/2011  . Unspecified vitamin D deficiency 01/20/2011  . VENTRICULAR TACHYCARDIA 01/20/2011  . PSEUDOTUMOR CEREBRI 05/12/2009    Lillyn Wieczorek,ANGIE PTA 03/17/2021, 4:08 PM  [address]. [place], [place], [address] Phone: [phone]   Fax:  [phone]  Name: Alexandra Hood MRN: [id number] Date of Birth: 05-22-1971

## 2021-03-24 ENCOUNTER — Ambulatory Visit: Payer: BC Managed Care – PPO | Admitting: Physical Therapy

## 2021-03-24 ENCOUNTER — Other Ambulatory Visit: Payer: Self-pay

## 2021-03-24 ENCOUNTER — Ambulatory Visit (INDEPENDENT_AMBULATORY_CARE_PROVIDER_SITE_OTHER): Payer: BC Managed Care – PPO

## 2021-03-24 DIAGNOSIS — I429 Cardiomyopathy, unspecified: Secondary | ICD-10-CM | POA: Diagnosis not present

## 2021-03-24 DIAGNOSIS — M542 Cervicalgia: Secondary | ICD-10-CM | POA: Diagnosis not present

## 2021-03-24 DIAGNOSIS — R293 Abnormal posture: Secondary | ICD-10-CM

## 2021-03-24 LAB — CUP PACEART REMOTE DEVICE CHECK
Battery Remaining Longevity: 88 mo
Battery Voltage: 2.98 V
Brady Statistic AP VP Percent: 0 %
Brady Statistic AP VS Percent: 0.02 %
Brady Statistic AS VP Percent: 0.04 %
Brady Statistic AS VS Percent: 99.95 %
Brady Statistic RA Percent Paced: 0.02 %
Brady Statistic RV Percent Paced: 0.04 %
Date Time Interrogation Session: 20220331001806
HighPow Impedance: 45 Ohm
HighPow Impedance: 59 Ohm
Implantable Lead Implant Date: 20120308
Implantable Lead Implant Date: 20120308
Implantable Lead Location: 753859
Implantable Lead Location: 753860
Implantable Lead Model: 5076
Implantable Lead Model: 6947
Implantable Pulse Generator Implant Date: 20190913
Lead Channel Impedance Value: 285 Ohm
Lead Channel Impedance Value: 380 Ohm
Lead Channel Impedance Value: 456 Ohm
Lead Channel Pacing Threshold Amplitude: 0.5 V
Lead Channel Pacing Threshold Amplitude: 0.875 V
Lead Channel Pacing Threshold Pulse Width: 0.4 ms
Lead Channel Pacing Threshold Pulse Width: 0.4 ms
Lead Channel Sensing Intrinsic Amplitude: 1.875 mV
Lead Channel Sensing Intrinsic Amplitude: 5.125 mV
Lead Channel Sensing Intrinsic Amplitude: 5.125 mV
Lead Channel Setting Pacing Amplitude: 1.5 V
Lead Channel Setting Pacing Amplitude: 2 V
Lead Channel Setting Pacing Pulse Width: 0.4 ms
Lead Channel Setting Sensing Sensitivity: 0.45 mV

## 2021-03-24 NOTE — Therapy (Signed)
[address]. [place], [place], [address] Phone: [phone]   Fax:  [phone]  Physical Therapy Treatment  Patient Details  Name: Alexandra Hood MRN: [id number] Date of Birth: May 06, 1971 Referring Provider (PT): Metta Clines, DO Neurology   Encounter Date: 03/24/2021   PT End of Session - 03/24/21 1548    Visit Number 5    Date for PT Re-Evaluation 04/18/21    PT Start Time 4401    PT Stop Time 1550    PT Time Calculation (min) 20 min           Past Medical History:  Diagnosis Date  . Headache   . Lupus pernio    -skin bx confirmed at [facility] Dermatology 05/07/2009 (personally reviewed) -looks improved 07/09/2009 -  . Pertussis   . Pseudotumor cerebri    .Marland KitchenMarland KitchenDr Sabra Heck and Dr. Katy Fitch - dxed Jan 2009, on diamox  - LP 01/24/2008 at [facility] - Normal ( pressure 14cm, gluc 68, Protein 23, Clear, colorless, RBC 3, WBC - too few to count)  - CT head non contrast done at [facility] Radiology 11/25/2007: "normal". Ddoes not report MRI, MRA, MRV being done - 2nd opinon by Dr. Leonie Man - reportdly had MRI/MRV both normal; followup pending  . Pulmonary sarcoidosis ([place])    - Spiro 07/25/2010 on pred 10/mtx 10 -> FEv1 1.84L/775%, FVC 2.25L - SPiro 09/06/2010 -> Fev1 2.14L/81%. Cut pred to 73m per day. Cont mtx - SPiro 01/20/2011 - Fev1 1.6L/61%, Ratio 74%. CXR Worse -> increased pred to 146mperday. Cont Mtx.    . Ventricular tachycardia ([place]  . Vitamin D deficiency    - Aug 2011: 22 and low -> commence Rx. - Jan 2012: 11    Past Surgical History:  Procedure Laterality Date  . CARDIAC DEFIBRILLATOR PLACEMENT  march 2012   [employer]tr[employer]  . ESOPHAGOGASTRODUODENOSCOPY (EGD) WITH PROPOFOL N/A 02/25/2016   Procedure: ESOPHAGOGASTRODUODENOSCOPY (EGD) WITH PROPOFOL;  Surgeon: JaLaurence SpatesMD;  [facility];  Service: Endoscopy;  Laterality: N/A;  . ICD GENERATOR CHANGEOUT N/A 09/06/2018   Procedure: ICD GENERATOR CHANGEOUT;   Surgeon: KlDeboraha SprangMD;  [facility];  Service: Cardiovascular;  Laterality: N/A;  . 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   Subjective Assessment - 03/24/21 1531    Subjective 100% better              [facility] - 03/24/21 0001      AROM   Right Shoulder Flexion 178 Degrees    Right Shoulder ABduction 175 Degrees    Right Shoulder Internal Rotation 90 Degrees    Right Shoulder External Rotation 90 Degrees                         [facility] Adult PT Treatment/Exercise - 03/24/21 0001      Shoulder Exercises: ROM/Strengthening   UBE (Upper Arm Bike) L 3 3 min fwd/3 min backwards                  PT Education - 03/24/21 1548    Education Details green tabdn scap stab    Person(s) Educated Patient    Methods Explanation;Demonstration;Handout    Comprehension Verbalized understanding;Returned demonstration            PT Short Term Goals - 03/10/21 1601  PT SHORT TERM GOAL #1   Title Independent with initial HEP    Status Achieved             PT Long Term Goals - 03/24/21 1534      PT LONG TERM GOAL #1   Title Independent with advanced HEP    Status Achieved      PT LONG TERM GOAL #2   Title Pt will attain full functional cervical ROM with </= 2/10 neck pain to facilitate ADLs, driving    Status Achieved      PT LONG TERM GOAL #3   Title Pt will demo full R shoulder ABD AROM symmetrical to left to facilitate completion of ADLs    Status Achieved      PT LONG TERM GOAL #4   Title Pt will demo increased R grip strength greater than or equal ot L grip strength Rt 60 Left 40    Status Achieved                 Plan - 03/24/21 1549    Clinical Impression Statement all goals met.Cerv and BIL shld ROM WFLs. RT grip> Left now. No pain and verb 100 % better. Pt doing free wt HEP so added scap stab HEP.    PT Treatment/Interventions ADLs/Self Care Home  Management;Cryotherapy;Electrical Stimulation;Iontophoresis 52m/ml Dexamethasone;Moist Heat;Neuromuscular re-education;Therapeutic exercise;Therapeutic activities;Functional mobility training;Patient/family education;Manual techniques;Taping;Energy conservation    PT Next Visit Plan HOLD to assure to isses or flare up           Patient will benefit from skilled therapeutic intervention in order to improve the following deficits and impairments:  Decreased range of motion,Increased fascial restricitons,Impaired UE functional use,Increased muscle spasms,Pain,Impaired flexibility,Improper body mechanics,Postural dysfunction,Decreased mobility,Decreased strength  Visit Diagnosis: Abnormal posture     Problem List Patient Active Problem List   Diagnosis Date Noted  . Tear of right acetabular labrum 04/24/2020  . Tear of left acetabular labrum 04/24/2020  . Trochanteric bursitis of right hip 04/20/2020  . Hip pain, acute, left 04/20/2020  . Leukocytosis 03/25/2020  . Dysuria 03/25/2020  . Right lower quadrant abdominal tenderness with rebound tenderness 03/25/2020  . Right lower quadrant abdominal pain 03/12/2020  . Alopecia 01/08/2019  . Papilledema of right eye 01/08/2019  . Migraine 01/07/2019  . Gross hematuria 06/15/2018  . Pertussis   . Flank pain 10/23/2016  . Multiple pulmonary nodules 08/24/2014  . Pulmonary sarcoidosis ([place] 08/24/2014  . Sinus tachycardia 10/23/2013  . 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 Thrombocytosis 03/06/2012  . Obesity 03/06/2012  . Pre-diabetes 03/06/2012  . 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 Keloid--ICD incision 04/25/2011  . Automatic implantable cardioverter-defibrillator in situ 04/18/2011  . DEPRESSION 02/15/2011  . Unspecified vitamin D deficiency 01/20/2011  . VENTRICULAR TACHYCARDIA 01/20/2011  . PSEUDOTUMOR CEREBRI 05/12/2009    Sasha Rogel,ANGIE PTA 03/24/2021, 3:51 PM  [place] [address]Phone: [phone]  Fax:  [phone] Name: DSarajean DessertMRN: [id number] of Birth: [birth year]

## 2021-04-06 NOTE — Progress Notes (Signed)
Remote ICD transmission.   

## 2021-06-23 ENCOUNTER — Ambulatory Visit (INDEPENDENT_AMBULATORY_CARE_PROVIDER_SITE_OTHER): Payer: BC Managed Care – PPO

## 2021-06-23 DIAGNOSIS — I429 Cardiomyopathy, unspecified: Secondary | ICD-10-CM

## 2021-06-23 LAB — CUP PACEART REMOTE DEVICE CHECK
Battery Remaining Longevity: 85 mo
Battery Voltage: 2.97 V
Brady Statistic AP VP Percent: 0 %
Brady Statistic AP VS Percent: 0.02 %
Brady Statistic AS VP Percent: 0.03 %
Brady Statistic AS VS Percent: 99.94 %
Brady Statistic RA Percent Paced: 0.03 %
Brady Statistic RV Percent Paced: 0.03 %
Date Time Interrogation Session: 20220630012304
HighPow Impedance: 46 Ohm
HighPow Impedance: 62 Ohm
Implantable Lead Implant Date: 20120308
Implantable Lead Implant Date: 20120308
Implantable Lead Location: 753859
Implantable Lead Location: 753860
Implantable Lead Model: 5076
Implantable Lead Model: 6947
Implantable Pulse Generator Implant Date: 20190913
Lead Channel Impedance Value: 285 Ohm
Lead Channel Impedance Value: 399 Ohm
Lead Channel Impedance Value: 437 Ohm
Lead Channel Pacing Threshold Amplitude: 0.375 V
Lead Channel Pacing Threshold Amplitude: 0.875 V
Lead Channel Pacing Threshold Pulse Width: 0.4 ms
Lead Channel Pacing Threshold Pulse Width: 0.4 ms
Lead Channel Sensing Intrinsic Amplitude: 2.5 mV
Lead Channel Sensing Intrinsic Amplitude: 2.5 mV
Lead Channel Sensing Intrinsic Amplitude: 5 mV
Lead Channel Sensing Intrinsic Amplitude: 5 mV
Lead Channel Setting Pacing Amplitude: 1.5 V
Lead Channel Setting Pacing Amplitude: 2 V
Lead Channel Setting Pacing Pulse Width: 0.4 ms
Lead Channel Setting Sensing Sensitivity: 0.45 mV

## 2021-06-23 NOTE — Progress Notes (Signed)
Alexandra Hood (Alexandra Hood) - [phone] Emgality 120MG /ML syringes (migraine)     Status: PA Request  Created: June 29th, 2022 [phone]  Sent: June 30th, 2022

## 2021-06-24 NOTE — Progress Notes (Signed)
Peighton Kerman Passey KeyLance Bosch - PA Case ID: [id number] - Rx #: [id number] Need help? Call us at [phone] Outcome Approved on June 30 06/23/21-06/23/21 Your PA request has been approved. Additional information will be provided in the approval communication. (Message [phone])

## 2021-07-13 NOTE — Progress Notes (Signed)
Remote ICD transmission.   

## 2021-07-20 NOTE — Progress Notes (Signed)
BYMDDYME Pa started on paper for [employer]. Paper work filled out and faxed.

## 2021-07-22 NOTE — Progress Notes (Signed)
Approved 07/22/2021 thru 07/22/2022. Approved contacted pharmacy.

## 2021-07-26 ENCOUNTER — Encounter: Payer: BC Managed Care – PPO | Admitting: Internal Medicine

## 2021-08-11 ENCOUNTER — Ambulatory Visit: Payer: BC Managed Care – PPO | Admitting: Neurology

## 2021-08-17 ENCOUNTER — Encounter: Payer: BC Managed Care – PPO | Admitting: Student

## 2021-08-31 ENCOUNTER — Telehealth: Payer: Self-pay | Admitting: Neurology

## 2021-08-31 NOTE — Telephone Encounter (Signed)
Patient called in stating her pharmacy will not refill her Emgality. She would like to find out what is going on.

## 2021-09-02 NOTE — Telephone Encounter (Signed)
F/u  Prior authorization initiated for medication Emaglity 120 mg.  Under cover my meds check eligibility status No eligibility was found. Please reconfirm the member's health plan coverage before re-submitting.  The patient was notified for clarification of insurance and voiced that everything was still the same, under coverage info verification status --Elapsed.   The patient was advised to reach out to her insurance company for verification.   The patient is asking for samples of medication.

## 2021-09-22 ENCOUNTER — Ambulatory Visit (INDEPENDENT_AMBULATORY_CARE_PROVIDER_SITE_OTHER): Payer: BC Managed Care – PPO

## 2021-09-22 DIAGNOSIS — I429 Cardiomyopathy, unspecified: Secondary | ICD-10-CM

## 2021-09-23 LAB — CUP PACEART REMOTE DEVICE CHECK
Battery Remaining Longevity: 78 mo
Battery Voltage: 2.96 V
Brady Statistic AP VP Percent: 0 %
Brady Statistic AP VS Percent: 0.03 %
Brady Statistic AS VP Percent: 0.03 %
Brady Statistic AS VS Percent: 99.94 %
Brady Statistic RA Percent Paced: 0.03 %
Brady Statistic RV Percent Paced: 0.03 %
Date Time Interrogation Session: 20220929223728
HighPow Impedance: 43 Ohm
HighPow Impedance: 56 Ohm
Implantable Lead Implant Date: 20120308
Implantable Lead Implant Date: 20120308
Implantable Lead Location: 753859
Implantable Lead Location: 753860
Implantable Lead Model: 5076
Implantable Lead Model: 6947
Implantable Pulse Generator Implant Date: 20190913
Lead Channel Impedance Value: 285 Ohm
Lead Channel Impedance Value: 342 Ohm
Lead Channel Impedance Value: 437 Ohm
Lead Channel Pacing Threshold Amplitude: 0.375 V
Lead Channel Pacing Threshold Amplitude: 0.625 V
Lead Channel Pacing Threshold Pulse Width: 0.4 ms
Lead Channel Pacing Threshold Pulse Width: 0.4 ms
Lead Channel Sensing Intrinsic Amplitude: 2.875 mV
Lead Channel Sensing Intrinsic Amplitude: 2.875 mV
Lead Channel Sensing Intrinsic Amplitude: 4.5 mV
Lead Channel Sensing Intrinsic Amplitude: 4.5 mV
Lead Channel Setting Pacing Amplitude: 1.5 V
Lead Channel Setting Pacing Amplitude: 2 V
Lead Channel Setting Pacing Pulse Width: 0.4 ms
Lead Channel Setting Sensing Sensitivity: 0.45 mV

## 2021-09-27 ENCOUNTER — Other Ambulatory Visit: Payer: Self-pay | Admitting: Neurology

## 2021-09-28 NOTE — Telephone Encounter (Signed)
No further refills until seen in feb

## 2021-09-29 NOTE — Progress Notes (Signed)
Remote ICD transmission.   

## 2021-10-19 ENCOUNTER — Other Ambulatory Visit: Payer: Self-pay

## 2021-10-19 ENCOUNTER — Ambulatory Visit: Payer: BC Managed Care – PPO | Admitting: Internal Medicine

## 2021-10-19 ENCOUNTER — Encounter: Payer: Self-pay | Admitting: Internal Medicine

## 2021-10-19 VITALS — BP 118/82 | HR 99 | Ht 64.0 in | Wt 224.0 lb

## 2021-10-19 DIAGNOSIS — Z95810 Presence of automatic (implantable) cardiac defibrillator: Secondary | ICD-10-CM

## 2021-10-19 DIAGNOSIS — I429 Cardiomyopathy, unspecified: Secondary | ICD-10-CM

## 2021-10-19 DIAGNOSIS — I4729 Other ventricular tachycardia: Secondary | ICD-10-CM

## 2021-10-19 LAB — HM MAMMOGRAPHY

## 2021-10-19 NOTE — Patient Instructions (Signed)

## 2021-10-19 NOTE — Progress Notes (Signed)
Patient Care Team: Maximiano Coss, NP as PCP - General (Adult Health Nurse Practitioner) Deboraha Sprang, MD as Consulting Physician (Cardiology) Pieter Partridge, DO as Consulting Physician (Neurology)   HPI  Alexandra Hood is a 50 y.o. female Is seen in followup for ventricular tachycardia noted on the monitor.  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   She is s/p ICD implantation 3/12 and gen replacement 9/19   Negative sleep study a few years ago  No edema     Used to wear sunglasses because of pseudotumor cerebri    The patient denies chest pain, shortness of breath, nocturnal dyspnea, orthopnea or peripheral edema.  There have been no palpitations, lightheadedness or syncope.  No evaluation in the last 6 or 8 years for inflammation, she is asymptomatic.  Is there any room for    Past Medical History:  Diagnosis Date   Headache    Lupus pernio    -skin bx confirmed at [facility] 05/07/2009 (personally reviewed) -looks improved 07/09/2009 -   Pertussis    Pseudotumor cerebri    .Marland KitchenMarland KitchenDr Sabra Heck and Dr. Katy Fitch - dxed Jan 2009, on diamox  - LP 01/24/2008 at [facility] - Normal ( pressure 14cm, gluc 68, Protein 23, Clear, colorless, RBC 3, WBC - too few to count)  - CT head non contrast done at [facility] 11/25/2007: "normal". Ddoes not report MRI, MRA, MRV being done - 2nd opinon by Dr. Leonie Man - reportdly had MRI/MRV both normal; followup pending   Pulmonary sarcoidosis ([place])    - Arlyce Harman 07/25/2010 on pred 10/mtx 10 -> FEv1 1.84L/775%, FVC 2.25L - SPiro 09/06/2010 -> Fev1 2.14L/81%. Cut pred to 5mg  per day. Cont mtx - SPiro 01/20/2011 - Fev1 1.6L/61%, Ratio 74%. CXR Worse -> increased pred to 10mg  perday. Cont Mtx.     Ventricular tachycardia    Vitamin D deficiency    - Aug 2011: 22 and low -> commence Rx. 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 8 g 1   EMGALITY 120 MG/ML SOSY INJECT 120 MG UNDER THE SKIN EVERY 30 DAYS 1 mL 6   ibuprofen (ADVIL,MOTRIN) 200 MG tablet Take 200-600 mg by mouth daily as needed for headache or moderate pain.     Multiple Vitamin (MULTIVITAMIN WITH MINERALS) TABS tablet Take 1 tablet by mouth daily.     Rimegepant Sulfate (NURTEC) 75 MG TBDP Take 75 mg by mouth daily as needed. 16 tablet 5   methocarbamol (ROBAXIN) 500 MG tablet Take 1 tablet (500 mg total) by mouth 4 (four) times daily. (Patient  not taking: Reported on 10/19/2021) 60 tablet 0   No current facility-administered medications for this visit.    No Known Allergies  Review of Systems negative except from HPI and PMH  Physical Exam BP 118/82   Pulse 99   Ht 5\' 4"  (1.626 m)   Wt 224 lb (101.6 kg)   LMP 12/25/2006   SpO2 93%   BMI 38.45 kg/m  Well developed and well nourished in no acute distress HENT normal Neck supple with JVP-flat Clear Device pocket well healed; without hematoma or erythema.  There is no tethering  Regular rate and rhythm, no  murmur Abd-soft with active BS No Clubbing cyanosis  edema Skin-warm and dry A & Oriented  Grossly normal sensory and  motor function  ECG sinus @ 99 15/07/35   Assessment and  Plan   Ventricular tachycardia   Elevated sinus rate  Implantable defibrillator-Medtronic   Cardiomyopathy-sarcoid  Pulmonary sarcoid   Dypsnea on exertion   Obese  No intercurrent ventricular tachycardia.  Blood pressure well controlled  Will reach out to Dr. Burt Knack at [facility] asking whether there is any role for PET scanning to assess sarcoid activity in the absence of symptoms   Elevated sinus rates are longstanding.

## 2021-10-21 LAB — HM PAP SMEAR: HM Pap smear: NORMAL

## 2021-10-21 LAB — RESULTS CONSOLE HPV: CHL HPV: NEGATIVE

## 2021-12-04 ENCOUNTER — Other Ambulatory Visit: Payer: Self-pay | Admitting: Neurology

## 2021-12-04 DIAGNOSIS — G43409 Hemiplegic migraine, not intractable, without status migrainosus: Secondary | ICD-10-CM

## 2021-12-22 ENCOUNTER — Ambulatory Visit (INDEPENDENT_AMBULATORY_CARE_PROVIDER_SITE_OTHER): Payer: BC Managed Care – PPO

## 2021-12-22 DIAGNOSIS — I429 Cardiomyopathy, unspecified: Secondary | ICD-10-CM

## 2021-12-22 LAB — CUP PACEART REMOTE DEVICE CHECK
Battery Remaining Longevity: 71 mo
Battery Voltage: 2.96 V
Brady Statistic AP VP Percent: 0 %
Brady Statistic AP VS Percent: 0.01 %
Brady Statistic AS VP Percent: 0.04 %
Brady Statistic AS VS Percent: 99.95 %
Brady Statistic RA Percent Paced: 0.01 %
Brady Statistic RV Percent Paced: 0.04 %
Date Time Interrogation Session: 20221229022724
HighPow Impedance: 49 Ohm
HighPow Impedance: 66 Ohm
Implantable Lead Implant Date: 20120308
Implantable Lead Implant Date: 20120308
Implantable Lead Location: 753859
Implantable Lead Location: 753860
Implantable Lead Model: 5076
Implantable Lead Model: 6947
Implantable Pulse Generator Implant Date: 20190913
Lead Channel Impedance Value: 323 Ohm
Lead Channel Impedance Value: 399 Ohm
Lead Channel Impedance Value: 494 Ohm
Lead Channel Pacing Threshold Amplitude: 0.375 V
Lead Channel Pacing Threshold Amplitude: 0.5 V
Lead Channel Pacing Threshold Pulse Width: 0.4 ms
Lead Channel Pacing Threshold Pulse Width: 0.4 ms
Lead Channel Sensing Intrinsic Amplitude: 3 mV
Lead Channel Sensing Intrinsic Amplitude: 3 mV
Lead Channel Sensing Intrinsic Amplitude: 4.75 mV
Lead Channel Sensing Intrinsic Amplitude: 4.75 mV
Lead Channel Setting Pacing Amplitude: 1.5 V
Lead Channel Setting Pacing Amplitude: 2 V
Lead Channel Setting Pacing Pulse Width: 0.4 ms
Lead Channel Setting Sensing Sensitivity: 0.45 mV

## 2022-01-03 NOTE — Progress Notes (Signed)
Remote ICD transmission.   

## 2022-02-01 NOTE — Progress Notes (Signed)
NEUROLOGY FOLLOW UP OFFICE NOTE  Alexandra Hood [id number]  Assessment/Plan:   1.  Migraine without aura, without status migrainosus, intractable 2.  Hemiplegic migraine 3.  Idiopathic intracranial hypertension 4.  Cervicalgia   1.  Migraine prevention:  Acetazolamide 250mg  twice daily; Emgality but take every 28 days.  Also Nurtec daily for last 8 days prior to next Emgality injection 2.  Migraine rescue:  Nurtec 3.  Limit use of pain relievers to no more than 2 days out of week to prevent risk of rebound or medication-overuse headache. 4.  Keep headache diary 5.  Follow up 1 year  Subjective:  Alexandra Hood is a 51 year old woman with sarcoidosis, ventricular tachycardia status post defibrillator, and history of idiopathic intracranial hypertension who follows up for migraines.   UPDATE: A year ago, switched from topiramate to acetazolamide to treat possible IIH. Intensity:  Severe Duration:  2 hours with Nurtec Frequency:  3 a month (usually last week prior to injection or just after taking injection May have no more than 1 hemiplegic migraine a month. drowsiness.  Robaxin not covered.   Eye exam yesterday was okay.   Preventative management:  Emgality monthly, acetazolamide 250mg  BID, Nurtec daily 8 days prior to [facility] injection. Rescue management:  Nurtec Current NSAIDS:  none Current analgesics:  Tylenol Current triptans:  Contraindicated (ventricular tachycardia and stroke-like symptoms) Current ergotamine:  no Current anti-emetic:  no Current muscle relaxants:  no Current anti-anxiolytic:  no Current sleep aide:  no Current Antihypertensive medications:  no Current Antidepressant medications:  no Current Anticonvulsant medications: acetazolamide 250mg  BID Current anti-CGRP:   Emgality, Nurtec Current Vitamins/Herbal/Supplements:  no Current Antihistamines/Decongestants:  no Other therapy:  no Hormone/Birth control:  No   Caffeine:   no Alcohol:  no Smoker:  no Diet:  hydrates Exercise:  walks Depression:  no; Anxiety:  no Other pain:  Pain related to sarcoidosis Sleep hygiene:  okay   HISTORY: She was diagnosed with pseudotumor cerebri in January 2009.  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  She underwent lumbar puncture on 01/24/08 which demonstrated normal opening pressure of 14 cm H2O with CSF glucose 68, protein 23 and 0 cell count.  She apparently had an MRI and MRV of the head performed, which were reportedly normal. She was on acetazolamide for awhile, which was subsequently discontinued due to resolution of papilledema.  She sees the ophthalmologist annually.  Those headaches were dull right sided posterior headaches. She had a repeat eye exam with Dr. Katy Fitch in November 2019, which showed mild blurring of the nasal disc margin but nothing dramatic.    She began experiencing migraines around age 21.  They are right frontal/temporal, non-throbbing and severe.  They are associated with nausea, photophobia, phonophobia, osmophobia, but not vomiting.  She sometimes sees white spots in her vision and has associated right upper and lower extremity numbness and weakness.  They typically last 2 days and occur 2 to 3 times a month but have increased in frequency.  No known triggers.  Relieved by rest.   Past NSAIDS:  naproxen 550mg  (drowsiness), Advil Migraine, Sprix NS (ineffective and caused burning), Cambia (effective but expensive) Past analgesics:  Tylenol, Excedrin Past abortive triptans:  no Past muscle relaxants:  Flexeril, tizanidine, Robaxin Past anti-emetic:  Zofran ODT 4mg  Past antihypertensive medications:  Toprol XL 25mg  Past antidepressant medications:  no Past anticonvulsant medications:   topiramate 100mg  at bedtime (150mg  caused cognitive deficits) Past anti--CGRP: Aimovig (possible  hair loss) Past vitamins/Herbal/Supplements:  no Past  antihistamines/decongestants:  no Other past therapies:  no   Family history of headache:  mother    PAST MEDICAL HISTORY: Past Medical History:  Diagnosis Date   Headache    Lupus pernio    -skin bx confirmed at [facility] 05/07/2009 (personally reviewed) -looks improved 07/09/2009 -   Pertussis    Pseudotumor cerebri    .Marland KitchenMarland KitchenDr Sabra Heck and Dr. Katy Fitch - dxed Jan 2009, on diamox  - LP 01/24/2008 at [facility] - Normal ( pressure 14cm, gluc 68, Protein 23, Clear, colorless, RBC 3, WBC - too few to count)  - CT head non contrast done at [facility] 11/25/2007: "normal". Ddoes not report MRI, MRA, MRV being done - 2nd opinon by Dr. Leonie Man - reportdly had MRI/MRV both normal; followup pending   Pulmonary sarcoidosis ([place])    - Arlyce Harman 07/25/2010 on pred 10/mtx 10 -> FEv1 1.84L/775%, FVC 2.25L - SPiro 09/06/2010 -> Fev1 2.14L/81%. Cut pred to 5mg  per day. Cont mtx - SPiro 01/20/2011 - Fev1 1.6L/61%, Ratio 74%. CXR Worse -> increased pred to 10mg  perday. Cont Mtx.     Ventricular tachycardia    Vitamin D deficiency    - Aug 2011: 22 and low -> commence Rx. - Jan 2012: 11    MEDICATIONS: Current Outpatient Medications on File Prior to Visit  Medication Sig Dispense Refill   acetaZOLAMIDE (DIAMOX) 250 MG tablet TAKE 1 TABLET(250 MG) BY MOUTH TWICE DAILY 60 tablet 3   albuterol (VENTOLIN HFA) 108 (90 Base) MCG/ACT inhaler Inhale 2 puffs into the lungs every 6 (six) hours as needed for wheezing or shortness of breath. 8 g 1   EMGALITY 120 MG/ML SOSY INJECT 120MG  UNDER THE SKIN EVERY 30 DAYS 1 mL 2   ibuprofen (ADVIL,MOTRIN) 200 MG tablet Take 200-600 mg by mouth daily as needed for headache or moderate pain.     methocarbamol (ROBAXIN) 500 MG tablet Take 1 tablet (500 mg total) by mouth 4 (four) times daily. (Patient not taking: Reported on 10/19/2021) 60 tablet 0   Multiple Vitamin (MULTIVITAMIN WITH MINERALS) TABS tablet Take 1 tablet by mouth daily.     Rimegepant Sulfate (NURTEC) 75 MG TBDP Take  75 mg by mouth daily as needed. 16 tablet 5   No current facility-administered medications on file prior to visit.    ALLERGIES: No Known Allergies  FAMILY HISTORY: Family History  Problem Relation Age of Onset   Allergies Mother    Diabetes Mother    Hypertension Mother    Hyperlipidemia Mother    Migraines Mother    Hyperlipidemia Father    Hypertension Father    Diabetes Father    Hyperlipidemia Sister    Hypertension Sister    Meniere's disease Sister    COPD Maternal Grandfather    Leukemia Paternal Grandmother    Heart attack Paternal Grandfather    Colon cancer Neg Hx    Esophageal cancer Neg Hx    Rectal cancer Neg Hx    Stomach cancer Neg Hx       Objective:  Blood pressure (!) 145/87, pulse (!) 102, height 5\' 4"  (1.626 m), weight 221 lb 6.4 oz (100.4 kg), last menstrual period 12/25/2006, SpO2 99 %. General: No acute distress.  Patient appears well-groomed.   Head:  Normocephalic/atraumatic Eyes:  Fundi examined but not visualized Neck: supple, no paraspinal tenderness, full range of motion Heart:  Regular rate and rhythm Lungs:  Clear to auscultation bilaterally Back: No  paraspinal tenderness Neurological Exam: alert and oriented to person, place, and time.  Speech fluent and not dysarthric, language intact.  CN II-XII intact. Bulk and tone normal, muscle strength 5/5 throughout.  Sensation to light touch intact.  Deep tendon reflexes 2+ throughout, toes downgoing.  Finger to nose testing intact.  Gait normal, Romberg negative.   Metta Clines, DO  CC: Maximiano Coss, NP

## 2022-02-02 ENCOUNTER — Ambulatory Visit: Payer: BC Managed Care – PPO | Admitting: Neurology

## 2022-02-02 ENCOUNTER — Encounter: Payer: Self-pay | Admitting: Neurology

## 2022-02-02 ENCOUNTER — Other Ambulatory Visit: Payer: Self-pay | Admitting: Neurology

## 2022-02-02 ENCOUNTER — Other Ambulatory Visit: Payer: Self-pay

## 2022-02-02 VITALS — BP 145/87 | HR 102 | Ht 64.0 in | Wt 221.4 lb

## 2022-02-02 DIAGNOSIS — G43409 Hemiplegic migraine, not intractable, without status migrainosus: Secondary | ICD-10-CM

## 2022-02-02 DIAGNOSIS — G932 Benign intracranial hypertension: Secondary | ICD-10-CM | POA: Diagnosis not present

## 2022-02-02 MED ORDER — ACETAZOLAMIDE 250 MG PO TABS: ORAL_TABLET | ORAL | 11 refills | Status: DC

## 2022-02-02 MED ORDER — NURTEC 75 MG PO TBDP: 75.0000 mg | ORAL_TABLET | Freq: Every day | ORAL | 11 refills | Status: DC | PRN

## 2022-02-02 MED ORDER — EMGALITY 120 MG/ML ~~LOC~~ SOSY: PREFILLED_SYRINGE | SUBCUTANEOUS | 11 refills | Status: DC

## 2022-02-02 NOTE — Patient Instructions (Signed)
Continue acetazolamide 250mg  twice daily, Emgality every 4 weeks and Nurtec daily for last 8 days prior to next Emgality Follow up 1 year.

## 2022-03-07 ENCOUNTER — Ambulatory Visit
Admission: EM | Admit: 2022-03-07 | Discharge: 2022-03-07 | Disposition: A | Discharge status: Home / Self Care | Payer: BC Managed Care – PPO | Attending: Physician Assistant | Admitting: Physician Assistant

## 2022-03-07 ENCOUNTER — Other Ambulatory Visit: Payer: Self-pay

## 2022-03-07 ENCOUNTER — Encounter: Payer: Self-pay | Admitting: Emergency Medicine

## 2022-03-07 DIAGNOSIS — H1033 Unspecified acute conjunctivitis, bilateral: Secondary | ICD-10-CM

## 2022-03-07 DIAGNOSIS — J029 Acute pharyngitis, unspecified: Secondary | ICD-10-CM

## 2022-03-07 MED ORDER — DOXYCYCLINE HYCLATE 100 MG PO CAPS: 100.0000 mg | ORAL_CAPSULE | Freq: Two times a day (BID) | ORAL | 0 refills | Status: DC

## 2022-03-07 MED ORDER — POLYMYXIN B-TRIMETHOPRIM 10000-0.1 UNIT/ML-% OP SOLN: 1.0000 [drp] | OPHTHALMIC | 0 refills | Status: AC

## 2022-03-07 NOTE — ED Provider Notes (Signed)
?[place] ? ? ? ?CSN: [id number] ?Arrival date & time: 03/07/22  1220 ? ? ?  ? ?History   ?Chief Complaint ?Chief Complaint  ?Patient presents with  ? Sore Throat  ? ? ?HPI ?Alexandra Hood is a 51 y.o. female.  ? ?Patient here today for evaluation of sore throat, nasal congestion, eye irritation and cough that started over a week ago.  She reports that she was prescribed penicillin and about day 4 of treatment and felt better for a day and then symptoms returned.  She has not had known fever.  She denies any vomiting or diarrhea. ? ?The history is provided by the patient.  ?Sore Throat ?Pertinent negatives include no abdominal pain and no shortness of breath.  ? ?Past Medical History:  ?Diagnosis Date  ? Headache   ? Lupus pernio   ? -skin bx confirmed at [facility] 05/07/2009 (personally reviewed) -looks improved 07/09/2009 -  ? Pertussis   ? Pseudotumor cerebri   ? Marland KitchenMarland KitchenMarland KitchenDr Sabra Heck and Dr. Katy Fitch - dxed Jan 2009, on diamox  - LP 01/24/2008 at [facility] - Normal ( pressure 14cm, gluc 68, Protein 23, Clear, colorless, RBC 3, WBC - too few to count)  - CT head non contrast done at [facility] 11/25/2007: "normal". Ddoes not report MRI, MRA, MRV being done - 2nd opinon by Dr. Leonie Man - reportdly had MRI/MRV both normal; followup pending  ? Pulmonary sarcoidosis ([place])   ? Arlyce Harman 07/25/2010 on pred 10/mtx 10 -> FEv1 1.84L/775%, FVC 2.25L - SPiro 09/06/2010 -> Fev1 2.14L/81%. Cut pred to '5mg'$  per day. Cont mtx - SPiro 01/20/2011 - Fev1 1.6L/61%, Ratio 74%. CXR Worse -> increased pred to '10mg'$  perday. Cont Mtx.    ? Ventricular tachycardia   ? Vitamin D deficiency   ? - Aug 2011: 22 and low -> commence Rx. - Jan 2012: 11  ? ? ?Patient Active Problem List  ? Diagnosis Date Noted  ? Tear of right acetabular labrum 04/24/2020  ? Tear of left acetabular labrum 04/24/2020  ? Trochanteric bursitis of right hip 04/20/2020  ? Hip pain, acute, left 04/20/2020  ? Leukocytosis 03/25/2020  ? Dysuria 03/25/2020  ?  Right lower quadrant abdominal tenderness with rebound tenderness 03/25/2020  ? Right lower quadrant abdominal pain 03/12/2020  ? Alopecia 01/08/2019  ? Papilledema of right eye 01/08/2019  ? Migraine 01/07/2019  ? Gross hematuria 06/15/2018  ? Pertussis   ? Flank pain 10/23/2016  ? Multiple pulmonary nodules 08/24/2014  ? Pulmonary sarcoidosis ([place]) 08/24/2014  ? Sinus tachycardia 10/23/2013  ? Cardiomyopathy, 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  ? Thrombocytosis 03/06/2012  ? Obesity 03/06/2012  ? Pre-diabetes 03/06/2012  ? Lupus pernio 05/18/2011  ? Keloid--ICD incision 04/25/2011  ? Automatic implantable cardioverter-defibrillator in situ 04/18/2011  ? DEPRESSION 02/15/2011  ? Unspecified vitamin D deficiency 01/20/2011  ? VENTRICULAR TACHYCARDIA 01/20/2011  ? PSEUDOTUMOR CEREBRI 05/12/2009  ? ? ?Past Surgical History:  ?Procedure Laterality Date  ? CARDIAC DEFIBRILLATOR PLACEMENT  march 2012  ? Medtronic  ? ESOPHAGOGASTRODUODENOSCOPY (EGD) WITH PROPOFOL N/A 02/25/2016  ? Procedure: ESOPHAGOGASTRODUODENOSCOPY (EGD) WITH PROPOFOL;  Surgeon: Laurence Spates, MD;  Location: [facility];  Service: Endoscopy;  Laterality: N/A;  ? ICD GENERATOR CHANGEOUT N/A 09/06/2018  ? Procedure: ICD GENERATOR CHANGEOUT;  Surgeon: Deboraha Sprang, MD;  Location: [facility];  Service: Cardiovascular;  Laterality: N/A;  ? 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 for teratoma  ? ? ?OB History   ?No obstetric history on file. ?  ? ? ? ?  Home Medications   ? ?Prior to Admission medications   ?Medication Sig Start Date End Date Taking? Authorizing Provider  ?doxycycline (VIBRAMYCIN) 100 MG capsule Take 1 capsule (100 mg total) by mouth 2 (two) times daily. 03/07/22  Yes Francene Finders, PA-C  ?trimethoprim-polymyxin b (POLYTRIM) ophthalmic solution Place 1 drop into both eyes every 4 (four) hours for 7 days. 03/07/22 03/14/22 Yes Francene Finders, PA-C  ?acetaZOLAMIDE (DIAMOX) 250 MG tablet TAKE 1 TABLET(250 MG) BY MOUTH TWICE DAILY 02/02/22   Pieter Partridge, DO   ?albuterol (VENTOLIN HFA) 108 (90 Base) MCG/ACT inhaler Inhale 2 puffs into the lungs every 6 (six) hours as needed for wheezing or shortness of breath. 04/06/20   Wendall Mola, NP  ?Galcanezumab-gnlm (EMGALITY) 120 MG/ML SOSY INJECT '120MG'$  UNDER THE SKIN EVERY 30 DAYS 02/02/22   Pieter Partridge, DO  ?ibuprofen (ADVIL,MOTRIN) 200 MG tablet Take 200-600 mg by mouth daily as needed for headache or moderate pain.    [provider]  ?Multiple Vitamin (MULTIVITAMIN WITH MINERALS) TABS tablet Take 1 tablet by mouth daily.    [provider]  ?Rimegepant Sulfate (NURTEC) 75 MG TBDP Take 75 mg by mouth daily as needed. 02/02/22   Pieter Partridge, DO  ? ? ?Family History ?Family History  ?Problem Relation Age of Onset  ? Allergies Mother   ? Diabetes Mother   ? Hypertension Mother   ? Hyperlipidemia Mother   ? Migraines Mother   ? Hyperlipidemia Father   ? Hypertension Father   ? Diabetes Father   ? Hyperlipidemia Sister   ? Hypertension Sister   ? Meniere's disease Sister   ? COPD Maternal Grandfather   ? Leukemia Paternal Grandmother   ? Heart attack Paternal Grandfather   ? Colon cancer Neg Hx   ? Esophageal cancer Neg Hx   ? Rectal cancer Neg Hx   ? Stomach cancer Neg Hx   ? ? ?Social History ?Social History  ? ?Tobacco Use  ? Smoking status: Former  ?  Packs/day: 1.00  ?  Years: 15.00  ?  Pack years: 15.00  ?  Types: Cigarettes  ?  Quit date: 03/03/2007  ?  Years since quitting: 15.0  ? Smokeless tobacco: Never  ?Substance Use Topics  ? Alcohol use: No  ?  Alcohol/week: 0.0 standard drinks  ? Drug use: No  ? ? ? ?Allergies   ?Patient has no known allergies. ? ? ?Review of Systems ?Review of Systems  ?Constitutional:  Negative for chills and fever.  ?HENT:  Positive for congestion and sore throat. Negative for ear pain.   ?Eyes:  Positive for redness.  ?Respiratory:  Positive for cough. Negative for shortness of breath and wheezing.   ?Gastrointestinal:  Negative for abdominal pain, diarrhea, nausea and  vomiting.  ? ? ?Physical Exam ?Triage Vital Signs ?ED Triage Vitals [03/07/22 1341]  ?Enc Vitals Group  ?   BP 126/81  ?   Pulse Rate 77  ?   Resp 16  ?   Temp 98 ?F (36.7 ?C)  ?   Temp Source Oral  ?   SpO2 96 %  ?   Weight   ?   Height   ?   Head Circumference   ?   Peak Flow   ?   Pain Score 5  ?   Pain Loc   ?   Pain Edu?   ?   Excl. in English?   ? ?No data found. ? ?  Updated Vital Signs ?BP 126/81 (BP Location: Left Arm)   Pulse 77   Temp 98 ?F (36.7 ?C) (Oral)   Resp 16   LMP 12/25/2006   SpO2 96%  ?   ? ?Physical Exam ?Vitals and nursing note reviewed.  ?Constitutional:   ?   General: She is not in acute distress. ?   Appearance: Normal appearance. She is not ill-appearing.  ?HENT:  ?   Head: Normocephalic and atraumatic.  ?   Nose: Congestion present.  ?   Mouth/Throat:  ?   Mouth: Mucous membranes are moist.  ?   Pharynx: Posterior oropharyngeal erythema present. No oropharyngeal exudate.  ?Eyes:  ?   Conjunctiva/sclera: Conjunctivae normal.  ?Cardiovascular:  ?   Rate and Rhythm: Normal rate and regular rhythm.  ?   Heart sounds: Normal heart sounds. No murmur heard. ?Pulmonary:  ?   Effort: Pulmonary effort is normal. No respiratory distress.  ?   Breath sounds: Normal breath sounds. No wheezing, rhonchi or rales.  ?Skin: ?   General: Skin is warm and dry.  ?Neurological:  ?   Mental Status: She is alert.  ?Psychiatric:     ?   Mood and Affect: Mood normal.     ?   Thought Content: Thought content normal.  ? ? ? ?UC Treatments / Results  ?Labs ?(all labs ordered are listed, but only abnormal results are displayed) ?Labs Reviewed - No data to display ? ?EKG ? ? ?Radiology ?No results found. ? ?Procedures ?Procedures (including critical care time) ? ?Medications Ordered in UC ?Medications - No data to display ? ?Initial Impression / Assessment and Plan / UC Course  ?I have reviewed the triage vital signs and the nursing notes. ? ?Pertinent labs & imaging results that were available during my care of the  patient were reviewed by me and considered in my medical decision making (see chart for details). ? ?  ?Given improvement with antibiotic therapy but not clearance will treat with more broad-spectrum antibiotic

## 2022-03-07 NOTE — ED Triage Notes (Signed)
Sore throat, nasal congestion, eye irritation, painful lymphnodes in neck, cough over a week ago. Took penicillin, felt better for a day, then all of the symptoms came back.  ?

## 2022-03-23 ENCOUNTER — Ambulatory Visit (INDEPENDENT_AMBULATORY_CARE_PROVIDER_SITE_OTHER): Payer: BC Managed Care – PPO

## 2022-03-23 DIAGNOSIS — I429 Cardiomyopathy, unspecified: Secondary | ICD-10-CM | POA: Diagnosis not present

## 2022-03-23 LAB — CUP PACEART REMOTE DEVICE CHECK
Battery Remaining Longevity: 70 mo
Battery Voltage: 2.96 V
Brady Statistic AP VP Percent: 0 %
Brady Statistic AP VS Percent: 0.01 %
Brady Statistic AS VP Percent: 0.03 %
Brady Statistic AS VS Percent: 99.95 %
Brady Statistic RA Percent Paced: 0.01 %
Brady Statistic RV Percent Paced: 0.03 %
Date Time Interrogation Session: 20230330012304
HighPow Impedance: 44 Ohm
HighPow Impedance: 56 Ohm
Implantable Lead Implant Date: 20120308
Implantable Lead Implant Date: 20120308
Implantable Lead Location: 753859
Implantable Lead Location: 753860
Implantable Lead Model: 5076
Implantable Lead Model: 6947
Implantable Pulse Generator Implant Date: 20190913
Lead Channel Impedance Value: 285 Ohm
Lead Channel Impedance Value: 342 Ohm
Lead Channel Impedance Value: 437 Ohm
Lead Channel Pacing Threshold Amplitude: 0.375 V
Lead Channel Pacing Threshold Amplitude: 0.5 V
Lead Channel Pacing Threshold Pulse Width: 0.4 ms
Lead Channel Pacing Threshold Pulse Width: 0.4 ms
Lead Channel Sensing Intrinsic Amplitude: 2.25 mV
Lead Channel Sensing Intrinsic Amplitude: 2.25 mV
Lead Channel Sensing Intrinsic Amplitude: 4.5 mV
Lead Channel Sensing Intrinsic Amplitude: 4.5 mV
Lead Channel Setting Pacing Amplitude: 1.5 V
Lead Channel Setting Pacing Amplitude: 2 V
Lead Channel Setting Pacing Pulse Width: 0.4 ms
Lead Channel Setting Sensing Sensitivity: 0.45 mV

## 2022-04-04 NOTE — Progress Notes (Signed)
Remote ICD transmission.   

## 2022-05-10 ENCOUNTER — Telehealth: Payer: Self-pay | Admitting: Neurology

## 2022-05-10 MED ORDER — PREDNISONE 10 MG (21) PO TBPK: ORAL_TABLET | ORAL | 0 refills | Status: DC

## 2022-05-10 NOTE — Telephone Encounter (Signed)
How frequent or the headaches (on average, how many days a week/month are they occurring)?  This is just one headache she can not break.  ?How long do the headaches last?  2 weeks not w ?Verify what preventative medication and dose you are taking (e.g. topiramate, propranolol, amitriptyline, Emgality, etc)  Emgality ?Verify which rescue medication you are taking (triptan, Advil, Excedrin, Aleve, Roselyn Meier, etc)  Ubrelvy, Tylenol  ?How often are you taking pain relievers/analgesics/rescue mediction?  2 x day.  ?

## 2022-05-10 NOTE — Telephone Encounter (Signed)
Patient advised of Dr.jaffe recommendation.  ?Patient agrees Prednisone 10 mg pack sent to the pharmacy.  ?

## 2022-05-10 NOTE — Telephone Encounter (Signed)
Patient has had a HA for 2 weeks now, and cant get rid of it. Needs to know what to do. Still taking meds as prescribed. ?

## 2022-05-12 ENCOUNTER — Telehealth: Payer: Self-pay | Admitting: Neurology

## 2022-05-12 ENCOUNTER — Ambulatory Visit (INDEPENDENT_AMBULATORY_CARE_PROVIDER_SITE_OTHER): Payer: BC Managed Care – PPO

## 2022-05-12 DIAGNOSIS — I639 Cerebral infarction, unspecified: Secondary | ICD-10-CM | POA: Diagnosis not present

## 2022-05-12 DIAGNOSIS — G43611 Persistent migraine aura with cerebral infarction, intractable, with status migrainosus: Secondary | ICD-10-CM | POA: Diagnosis not present

## 2022-05-12 MED ORDER — DIPHENHYDRAMINE HCL 50 MG/ML IJ SOLN
25.0000 mg | Freq: Once | INTRAMUSCULAR | Status: AC
  Administered 2022-05-12: 25 mg via INTRAMUSCULAR

## 2022-05-12 MED ORDER — METOCLOPRAMIDE HCL 5 MG/ML IJ SOLN
10.0000 mg | Freq: Once | INTRAMUSCULAR | Status: AC
  Administered 2022-05-12: 10 mg via INTRAMUSCULAR

## 2022-05-12 MED ORDER — KETOROLAC TROMETHAMINE 60 MG/2ML IM SOLN
60.0000 mg | Freq: Once | INTRAMUSCULAR | Status: AC
  Administered 2022-05-12: 60 mg via INTRAMUSCULAR

## 2022-05-12 NOTE — Telephone Encounter (Signed)
Pt was called in prednisone about 2 weeks ago and it is not helping. She would like to speak to a  nurse about this   [facility] in [place] on [address]

## 2022-05-12 NOTE — Telephone Encounter (Signed)
Pt called an informed that If she has a driver, she may come in for a headache cocktail and at the same time provide her samples of Ubrelvy to try - 1 tablet as needed. May repeat after 2 hours.  Maximum 2 tablets in 24 hours. Pt advised to be here by 4pm

## 2022-05-16 ENCOUNTER — Telehealth: Payer: Self-pay | Admitting: Neurology

## 2022-05-16 DIAGNOSIS — G932 Benign intracranial hypertension: Secondary | ICD-10-CM

## 2022-05-16 NOTE — Telephone Encounter (Signed)
The following message was left with [facility] on 05/16/22 at 1:37 PM.  Caller states she has a hx of migraines and has been having migraines for the last 3 weeks. She was in the office on Friday for a headache cocktail and was able to rest but woke up with a headache on Saturday. Pain is a 9/10. She took Nurtec, Excedrin, and Diamox this morning with no relief.  Patient described as "worst headache in life" to triage nurse and was advised to proceed to the ED. Caller expressed understanding.  See detailed printout in provider's inbox.

## 2022-05-16 NOTE — Telephone Encounter (Signed)
See renee encounter with patient.

## 2022-05-16 NOTE — Telephone Encounter (Signed)
Pt called in stating the headache cocktail she had on 05/12/22 only helped for that one day. Saturday the headache was back. She is still in pain and doesn't know what to do. She says she is afraid this isn't a headache because she feels like she did before she was diagnosed with pseudotumor cerebri.

## 2022-05-16 NOTE — Telephone Encounter (Signed)
Have her increase her diamox (acetazolamide) to 2 in the AM, 1 at bed.  Have her come get a BMP in 1 week.  Also have her see her eye doctor to have eyes checked and send results to Dr. Tomi Likens.  Patient advised of Dr.Tat note, order for BMP added. Patient last eye exam was in Jan or Feb of this year. She will  call to schedule another one.

## 2022-05-23 ENCOUNTER — Other Ambulatory Visit (INDEPENDENT_AMBULATORY_CARE_PROVIDER_SITE_OTHER): Payer: BC Managed Care – PPO

## 2022-05-23 DIAGNOSIS — G932 Benign intracranial hypertension: Secondary | ICD-10-CM

## 2022-05-23 LAB — BASIC METABOLIC PANEL
BUN: 12 mg/dL (ref 6–23)
CO2: 24 mEq/L (ref 19–32)
Calcium: 9.2 mg/dL (ref 8.4–10.5)
Chloride: 109 mEq/L (ref 96–112)
Creatinine, Ser: 0.82 mg/dL (ref 0.40–1.20)
GFR: 84.29 mL/min (ref >60.00)
Glucose, Bld: 170 mg/dL — ABNORMAL HIGH (ref 70–99)
Potassium: 3.9 mEq/L (ref 3.5–5.1)
Sodium: 140 mEq/L (ref 135–145)

## 2022-05-30 ENCOUNTER — Telehealth: Payer: Self-pay | Admitting: Neurology

## 2022-05-30 NOTE — Telephone Encounter (Signed)
Reviewed ophthalmology note.  No evidence of any significant optic nerve swelling.  In case this still represents pseudotumor cerebri, would have her increase acetazolamide up further to '500mg'$  twice daily.  I would like for her to make a follow up appointment in next 2-3 months.

## 2022-05-31 NOTE — Telephone Encounter (Signed)
Patient advised of Dr.Jaffe note.  Patient wanted to wait before I send in the increase on Diamox.  Patient c/o fatigue after taking the a hole 250 mg. So she takes an half of pill. So is wherry about going up 500 mg BID.

## 2022-06-22 ENCOUNTER — Telehealth (HOSPITAL_COMMUNITY): Payer: Self-pay | Admitting: Pharmacy Technician

## 2022-06-22 ENCOUNTER — Ambulatory Visit (INDEPENDENT_AMBULATORY_CARE_PROVIDER_SITE_OTHER): Payer: BC Managed Care – PPO

## 2022-06-22 ENCOUNTER — Other Ambulatory Visit (HOSPITAL_COMMUNITY): Payer: Self-pay

## 2022-06-22 DIAGNOSIS — I429 Cardiomyopathy, unspecified: Secondary | ICD-10-CM

## 2022-06-22 LAB — CUP PACEART REMOTE DEVICE CHECK
Battery Remaining Longevity: 63 mo
Battery Voltage: 2.95 V
Brady Statistic AP VP Percent: 0 %
Brady Statistic AP VS Percent: 0.02 %
Brady Statistic AS VP Percent: 0.03 %
Brady Statistic AS VS Percent: 99.95 %
Brady Statistic RA Percent Paced: 0.02 %
Brady Statistic RV Percent Paced: 0.03 %
Date Time Interrogation Session: 20230629043823
HighPow Impedance: 45 Ohm
HighPow Impedance: 51 Ohm
Implantable Lead Implant Date: 20120308
Implantable Lead Implant Date: 20120308
Implantable Lead Location: 753859
Implantable Lead Location: 753860
Implantable Lead Model: 5076
Implantable Lead Model: 6947
Implantable Pulse Generator Implant Date: 20190913
Lead Channel Impedance Value: 285 Ohm
Lead Channel Impedance Value: 380 Ohm
Lead Channel Impedance Value: 437 Ohm
Lead Channel Pacing Threshold Amplitude: 0.375 V
Lead Channel Pacing Threshold Amplitude: 0.75 V
Lead Channel Pacing Threshold Pulse Width: 0.4 ms
Lead Channel Pacing Threshold Pulse Width: 0.4 ms
Lead Channel Sensing Intrinsic Amplitude: 2 mV
Lead Channel Sensing Intrinsic Amplitude: 2 mV
Lead Channel Sensing Intrinsic Amplitude: 4.875 mV
Lead Channel Sensing Intrinsic Amplitude: 4.875 mV
Lead Channel Setting Pacing Amplitude: 1.5 V
Lead Channel Setting Pacing Amplitude: 2 V
Lead Channel Setting Pacing Pulse Width: 0.4 ms
Lead Channel Setting Sensing Sensitivity: 0.45 mV

## 2022-06-22 NOTE — Telephone Encounter (Signed)
Patient Advocate Encounter  Prior Authorization for Nurtec '75MG'$  dispersible tablets  has been approved.    PA# [id number] Effective dates: 06/22/2022 through 06/23/2023      Lyndel Safe, [place] Patient Advocate Specialist [place] Patient Advocate Team Direct Number: [phone]  Fax: [phone]

## 2022-06-22 NOTE — Telephone Encounter (Signed)
Patient Advocate Encounter   Received notification that prior authorization for Nurtec '75MG'$  dispersible tablets is required.   PA submitted on 06/22/2022 Key [id number] Status is pending       Lyndel Safe, [place] Patient Advocate Specialist [place] Patient Advocate Team Direct Number: [phone]  Fax: [phone]

## 2022-07-06 ENCOUNTER — Other Ambulatory Visit (HOSPITAL_COMMUNITY): Payer: Self-pay

## 2022-07-06 ENCOUNTER — Telehealth (HOSPITAL_COMMUNITY): Payer: Self-pay | Admitting: Pharmacy Technician

## 2022-07-06 NOTE — Telephone Encounter (Signed)
Patient Advocate Encounter  Prior Authorization for Emgality '120MG'$ /ML syringes (migraine) has been approved.    PA# [id number] Effective dates: 07/06/2022 through 07/07/2023     Lyndel Safe, [place] Patient Advocate Specialist [place] Patient Advocate Team Direct Number: [phone]  Fax: [phone]

## 2022-07-06 NOTE — Telephone Encounter (Signed)
Patient Advocate Encounter   Received notification that prior authorization for Emgality '120MG'$ /ML syringes (migraine) is required.   PA submitted on 07/06/2022 Key [id number] Status is pending       Lyndel Safe, [place] Patient Advocate Specialist [place] Patient Advocate Team Direct Number: [phone]  Fax: [phone]

## 2022-07-07 NOTE — Progress Notes (Signed)
Remote ICD transmission.   

## 2022-09-21 ENCOUNTER — Ambulatory Visit (INDEPENDENT_AMBULATORY_CARE_PROVIDER_SITE_OTHER): Payer: BC Managed Care – PPO

## 2022-09-21 DIAGNOSIS — I429 Cardiomyopathy, unspecified: Secondary | ICD-10-CM

## 2022-09-21 LAB — CUP PACEART REMOTE DEVICE CHECK
Battery Remaining Longevity: 56 mo
Battery Voltage: 2.94 V
Brady Statistic AP VP Percent: 0 %
Brady Statistic AP VS Percent: 0.07 %
Brady Statistic AS VP Percent: 0.03 %
Brady Statistic AS VS Percent: 99.9 %
Brady Statistic RA Percent Paced: 0.07 %
Brady Statistic RV Percent Paced: 0.04 %
Date Time Interrogation Session: 20230928001606
HighPow Impedance: 43 Ohm
HighPow Impedance: 54 Ohm
Implantable Lead Implant Date: 20120308
Implantable Lead Implant Date: 20120308
Implantable Lead Location: 753859
Implantable Lead Location: 753860
Implantable Lead Model: 5076
Implantable Lead Model: 6947
Implantable Pulse Generator Implant Date: 20190913
Lead Channel Impedance Value: 285 Ohm
Lead Channel Impedance Value: 380 Ohm
Lead Channel Impedance Value: 437 Ohm
Lead Channel Pacing Threshold Amplitude: 0.375 V
Lead Channel Pacing Threshold Amplitude: 0.75 V
Lead Channel Pacing Threshold Pulse Width: 0.4 ms
Lead Channel Pacing Threshold Pulse Width: 0.4 ms
Lead Channel Sensing Intrinsic Amplitude: 2.75 mV
Lead Channel Sensing Intrinsic Amplitude: 2.75 mV
Lead Channel Sensing Intrinsic Amplitude: 5.125 mV
Lead Channel Sensing Intrinsic Amplitude: 5.125 mV
Lead Channel Setting Pacing Amplitude: 1.5 V
Lead Channel Setting Pacing Amplitude: 2 V
Lead Channel Setting Pacing Pulse Width: 0.4 ms
Lead Channel Setting Sensing Sensitivity: 0.45 mV

## 2022-09-27 NOTE — Progress Notes (Signed)
Remote ICD transmission.   

## 2022-10-03 NOTE — Progress Notes (Signed)
NEUROLOGY FOLLOW UP OFFICE NOTE  Alexandra Hood [id number]  Assessment/Plan:   1.  Migraine without aura, without status migrainosus, intractable 2.  Hemiplegic migraine 3.  Idiopathic intracranial hypertension 4.  Cervicalgia   1.  Migraine prevention:  Due to drowsiness, stop acetazolamide.  Start topiramate '25mg'$  at bedtime; Emgality but take every 28 days.  Also Nurtec daily for last 8 days prior to next Emgality injection 2.  Migraine rescue:  Nurtec 3.  Limit use of pain relievers to no more than 2 days out of week to prevent risk of rebound or medication-overuse headache. 4.  Keep headache diary 5.  Follow up 9 months.  Subjective:  Alexandra Hood is a 51 year old woman with sarcoidosis, ventricular tachycardia status post defibrillator, and history of idiopathic intracranial hypertension who follows up for migraines.   UPDATE: She had intractable headaches in May which did not respond to prednisone taper or migraine cocktail. She endorsed that the headaches were similar to the ones she experienced when she was diagnosed with IIH.  Acetazolamide was increased to '500mg'$  in AM and '250mg'$  at night.  She had a repeat ophthalmology exam which did not reveal evidence of papilledema.  The increased acetazolamide has caused excessive daytime sleepiness.    Intensity:  Severe Duration:  2 hours with Nurtec Frequency:  3 a month (usually last week prior to injection or just after taking injection May have no more than 1 hemiplegic migraine a month. drowsiness.  Robaxin not covered.    Preventative management:  Emgality monthly, acetazolamide '250mg'$  BID, Nurtec daily 8 days prior to [facility] injection. Rescue management:  Nurtec Current NSAIDS:  none Current analgesics:  Tylenol Current triptans:  Contraindicated (ventricular tachycardia and stroke-like symptoms) Current ergotamine:  no Current anti-emetic:  no Current muscle relaxants:  no Current anti-anxiolytic:   no Current sleep aide:  no Current Antihypertensive medications:  no Current Antidepressant medications:  no Current Anticonvulsant medications: acetazolamide '250mg'$  BID Current anti-CGRP:   Emgality, Nurtec Current Vitamins/Herbal/Supplements:  no Current Antihistamines/Decongestants:  no Other therapy:  no Hormone/Birth control:  No   Caffeine:  no Alcohol:  no Smoker:  no Diet:  hydrates Exercise:  walks Depression:  no; Anxiety:  no Other pain:  Pain related to sarcoidosis Sleep hygiene:  okay   HISTORY: She was diagnosed with pseudotumor cerebri in January 2009.  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  She underwent lumbar puncture on 01/24/08 which demonstrated normal opening pressure of 14 cm H2O with CSF glucose 68, protein 23 and 0 cell count.  She apparently had an MRI and MRV of the head performed, which were reportedly normal. She was on acetazolamide for awhile, which was subsequently discontinued due to resolution of papilledema.  She sees the ophthalmologist annually.  Those headaches were dull right sided posterior headaches. She had a repeat eye exam with Dr. Katy Fitch in November 2019, which showed mild blurring of the nasal disc margin but nothing dramatic.    She began experiencing migraines around age 13.  They are right frontal/temporal, non-throbbing and severe.  They are associated with nausea, photophobia, phonophobia, osmophobia, but not vomiting.  She sometimes sees white spots in her vision and has associated right upper and lower extremity numbness and weakness.  They typically last 2 days and occur 2 to 3 times a month but have increased in frequency.  No known triggers.  Relieved by rest.   Past NSAIDS:  naproxen '550mg'$  (drowsiness), Advil Migraine, Sprix  NS (ineffective and caused burning), Cambia (effective but expensive) Past analgesics:  Tylenol, Excedrin Past abortive triptans:  no Past muscle relaxants:  Flexeril,  tizanidine, Robaxin Past anti-emetic:  Zofran ODT '4mg'$  Past antihypertensive medications:  Toprol XL '25mg'$  Past antidepressant medications:  no Past anticonvulsant medications:   topiramate '100mg'$  at bedtime ('150mg'$  caused cognitive deficits) Past anti--CGRP: Aimovig (possible hair loss) Past vitamins/Herbal/Supplements:  no Past antihistamines/decongestants:  no Other past therapies:  no   Family history of headache:  mother    PAST MEDICAL HISTORY: Past Medical History:  Diagnosis Date   Headache    Lupus pernio    -skin bx confirmed at [facility] 05/07/2009 (personally reviewed) -looks improved 07/09/2009 -   Pertussis    Pseudotumor cerebri    .Marland KitchenMarland KitchenDr Sabra Heck and Dr. Katy Fitch - dxed Jan 2009, on diamox  - LP 01/24/2008 at [facility] - Normal ( pressure 14cm, gluc 68, Protein 23, Clear, colorless, RBC 3, WBC - too few to count)  - CT head non contrast done at [facility] 11/25/2007: "normal". Ddoes not report MRI, MRA, MRV being done - 2nd opinon by Dr. Leonie Man - reportdly had MRI/MRV both normal; followup pending   Pulmonary sarcoidosis ([place])    - Arlyce Harman 07/25/2010 on pred 10/mtx 10 -> FEv1 1.84L/775%, FVC 2.25L - SPiro 09/06/2010 -> Fev1 2.14L/81%. Cut pred to '5mg'$  per day. Cont mtx - SPiro 01/20/2011 - Fev1 1.6L/61%, Ratio 74%. CXR Worse -> increased pred to '10mg'$  perday. Cont Mtx.     Ventricular tachycardia    Vitamin D deficiency    - Aug 2011: 22 and low -> commence Rx. - Jan 2012: 11    MEDICATIONS: Current Outpatient Medications on File Prior to Visit  Medication Sig Dispense Refill   albuterol (VENTOLIN HFA) 108 (90 Base) MCG/ACT inhaler Inhale 2 puffs into the lungs every 6 (six) hours as needed for wheezing or shortness of breath. 8 g 1   ibuprofen (ADVIL,MOTRIN) 200 MG tablet Take 200-600 mg by mouth daily as needed for headache or moderate pain.     Multiple Vitamin (MULTIVITAMIN WITH MINERALS) TABS tablet Take 1 tablet by mouth daily.     No current facility-administered  medications on file prior to visit.     ALLERGIES: No Known Allergies  FAMILY HISTORY: Family History  Problem Relation Age of Onset   Allergies Mother    Diabetes Mother    Hypertension Mother    Hyperlipidemia Mother    Migraines Mother    Hyperlipidemia Father    Hypertension Father    Diabetes Father    Hyperlipidemia Sister    Hypertension Sister    Meniere's disease Sister    COPD Maternal Grandfather    Leukemia Paternal Grandmother    Heart attack Paternal Grandfather    Colon cancer Neg Hx    Esophageal cancer Neg Hx    Rectal cancer Neg Hx    Stomach cancer Neg Hx       Objective:  Blood pressure (!) 143/82, pulse (!) 101, height '5\' 4"'$  (1.626 m), weight 222 lb 6.4 oz (100.9 kg), last menstrual period 12/25/2006, SpO2 100 %. General: No acute distress.  Patient appears well-groomed.   Head:  Normocephalic/atraumatic Eyes:  Fundi examined but not visualized Neck: supple, no paraspinal tenderness, full range of motion Heart:  Regular rate and rhythm Neurological Exam: alert and oriented to person, place, and time.  Speech fluent and not dysarthric, language intact.  CN II-XII intact. Bulk and tone normal, muscle strength  5/5 throughout.  Sensation to light touch intact.  Deep tendon reflexes 2+ throughout.  Finger to nose testing intact.  Gait normal, Romberg negative.   Metta Clines, DO  CC: Maximiano Coss, NP

## 2022-10-06 ENCOUNTER — Ambulatory Visit: Payer: BC Managed Care – PPO | Admitting: Neurology

## 2022-10-06 ENCOUNTER — Encounter: Payer: Self-pay | Admitting: Neurology

## 2022-10-06 VITALS — BP 143/82 | HR 101 | Ht 64.0 in | Wt 222.4 lb

## 2022-10-06 DIAGNOSIS — G43409 Hemiplegic migraine, not intractable, without status migrainosus: Secondary | ICD-10-CM

## 2022-10-06 DIAGNOSIS — G932 Benign intracranial hypertension: Secondary | ICD-10-CM

## 2022-10-06 MED ORDER — EMGALITY 120 MG/ML ~~LOC~~ SOSY: PREFILLED_SYRINGE | SUBCUTANEOUS | 11 refills | Status: DC

## 2022-10-06 MED ORDER — NURTEC 75 MG PO TBDP: 75.0000 mg | ORAL_TABLET | Freq: Every day | ORAL | 11 refills | Status: DC | PRN

## 2022-10-06 MED ORDER — TOPIRAMATE 25 MG PO TABS: 25.0000 mg | ORAL_TABLET | Freq: Every day | ORAL | 8 refills | Status: DC

## 2022-10-06 NOTE — Patient Instructions (Signed)
Stop acetazolamide. Start topiramate '25mg'$  at bedtime Continue Emgality Continue Nurtec as needed Follow up 9 months.

## 2022-10-20 ENCOUNTER — Encounter: Payer: Self-pay | Admitting: Nurse Practitioner

## 2022-10-20 ENCOUNTER — Ambulatory Visit: Payer: BC Managed Care – PPO | Admitting: Nurse Practitioner

## 2022-10-20 VITALS — BP 122/88 | HR 108 | Temp 97.6°F | Wt 219.2 lb

## 2022-10-20 DIAGNOSIS — E559 Vitamin D deficiency, unspecified: Secondary | ICD-10-CM | POA: Diagnosis not present

## 2022-10-20 DIAGNOSIS — G43711 Chronic migraine without aura, intractable, with status migrainosus: Secondary | ICD-10-CM | POA: Diagnosis not present

## 2022-10-20 DIAGNOSIS — I429 Cardiomyopathy, unspecified: Secondary | ICD-10-CM

## 2022-10-20 DIAGNOSIS — R7301 Impaired fasting glucose: Secondary | ICD-10-CM | POA: Diagnosis not present

## 2022-10-20 LAB — COMPREHENSIVE METABOLIC PANEL
ALT: 16 U/L (ref 0–35)
AST: 15 U/L (ref 0–37)
Albumin: 4.6 g/dL (ref 3.5–5.2)
Alkaline Phosphatase: 103 U/L (ref 39–117)
BUN: 8 mg/dL (ref 6–23)
CO2: 27 mEq/L (ref 19–32)
Calcium: 9.5 mg/dL (ref 8.4–10.5)
Chloride: 103 mEq/L (ref 96–112)
Creatinine, Ser: 0.67 mg/dL (ref 0.40–1.20)
GFR: 102.7 mL/min (ref >60.00)
Glucose, Bld: 79 mg/dL (ref 70–99)
Potassium: 4.1 mEq/L (ref 3.5–5.1)
Sodium: 141 mEq/L (ref 135–145)
Total Bilirubin: 0.4 mg/dL (ref 0.2–1.2)
Total Protein: 7.5 g/dL (ref 6.0–8.3)

## 2022-10-20 LAB — CBC
HCT: 39.8 % (ref 36.0–46.0)
Hemoglobin: 13.1 g/dL (ref 12.0–15.0)
MCHC: 32.8 g/dL (ref 30.0–36.0)
MCV: 87.1 fl (ref 78.0–100.0)
Platelets: 374 10*3/uL (ref 150.0–400.0)
RBC: 4.56 Mil/uL (ref 3.87–5.11)
RDW: 14.7 % (ref 11.5–15.5)
WBC: 7.4 10*3/uL (ref 4.0–10.5)

## 2022-10-20 LAB — LIPID PANEL
Cholesterol: 230 mg/dL — ABNORMAL HIGH (ref 0–200)
HDL: 44.5 mg/dL (ref >39.00)
LDL Cholesterol: 157 mg/dL — ABNORMAL HIGH (ref 0–99)
NonHDL: 185.32
Total CHOL/HDL Ratio: 5
Triglycerides: 144 mg/dL (ref 0.0–149.0)
VLDL: 28.8 mg/dL (ref 0.0–40.0)

## 2022-10-20 LAB — HEMOGLOBIN A1C: Hgb A1c MFr Bld: 6.4 % (ref 4.6–6.5)

## 2022-10-20 LAB — VITAMIN D 25 HYDROXY (VIT D DEFICIENCY, FRACTURES): VITD: <7 ng/mL — ABNORMAL LOW (ref 30.00–100.00)

## 2022-10-20 MED ORDER — VITAMIN D (ERGOCALCIFEROL) 1.25 MG (50000 UNIT) PO CAPS: 50000.0000 [IU] | ORAL_CAPSULE | ORAL | 1 refills | Status: DC

## 2022-10-20 NOTE — Assessment & Plan Note (Signed)
Chronic, stable.  She is currently following with neurology.  She is taking Emgality, Topamax 25 mg at bedtime, and Nurtec as needed.  She currently gets about 3 migraines per month.  Continue recommendations in collaboration with neurology.

## 2022-10-20 NOTE — Addendum Note (Signed)
Addended by: Vance Peper A on: 10/20/2022 04:28 PM   Modules accepted: Orders

## 2022-10-20 NOTE — Assessment & Plan Note (Addendum)
Chronic, stable.  She is currently following with cardiology and has an ICD in place.  We will check CMP, CBC, lipid panel today

## 2022-10-20 NOTE — Progress Notes (Signed)
New Patient Visit  BP 122/88 (BP Location: Right Arm, Patient Position: Sitting)   Pulse (!) 108   Temp 97.6 F (36.4 C)   Wt 219 lb 3.2 oz (99.4 kg)   LMP 12/25/2006   SpO2 93%   BMI 37.63 kg/m    Subjective:    Patient ID: Alexandra Hood, female    DOB: 1971-05-18, 51 y.o.   MRN: [id number]  CC: Chief Complaint  Patient presents with   Establish Care    Np. Est care. No main concerns. Overall health assessment    HPI: Alexandra Hood is a 51 y.o. female presents to transfer care to a new provider.  Introduced to Designer, jewellery role and practice setting.  All questions answered.  Discussed provider/patient relationship and expectations.  She has a history of migraines that cause weakness on her right side. She currently gets about 3 per month, however before starting medication, they were happening every day. She is currently following with neurology and is taking emgality, topamax '25mg'$  at bedtime, and nurtec as needed.   She has a history of sarcoidosis of lungs and heart. 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 She is following with cardiology routinely and last saw pulmonology a few years ago. She denies chest pain and shortness of breath.   Depression and Anxiety Screen done:     10/20/2022   11:00 AM 06/02/2020    4:22 PM 04/16/2020    3:13 PM 03/30/2020    2:17 PM 03/25/2020   10:04 AM  Depression screen PHQ 2/9  Decreased Interest 0 0 0 0 0  Down, Depressed, Hopeless 0 0 0 0 0  PHQ - 2 Score 0 0 0 0 0  Altered sleeping 0      Tired, decreased energy 0      Change in appetite 0      Feeling bad or failure about yourself  0      Trouble concentrating 0      Moving slowly or fidgety/restless 0      Suicidal thoughts 0      PHQ-9 Score 0          10/20/2022   11:00 AM 03/25/2020   10:04 AM 03/12/2020    4:05 PM  GAD 7 : Generalized Anxiety Score  Nervous, Anxious, on Edge 0 0 0  Control/stop worrying 0 0 0  Worry too much -  different things 0 0 0  Trouble relaxing 0 0 0  Restless 0 0 0  Easily annoyed or irritable 0 0 0  Afraid - awful might happen 0 0 0  Total GAD 7 Score 0 0 0  Anxiety Difficulty  Not difficult at all Not difficult at all    Past Medical History:  Diagnosis Date   Headache    Lupus pernio    -skin bx confirmed at [facility] 05/07/2009 (personally reviewed) -looks improved 07/09/2009 -   Pertussis    Pseudotumor cerebri    .Marland KitchenMarland KitchenDr Sabra Heck and Dr. Katy Fitch - dxed Jan 2009, on diamox  - LP 01/24/2008 at [facility] - Normal ( pressure 14cm, gluc 68, Protein 23, Clear, colorless, RBC 3, WBC - too few to count)  - CT head non contrast done at [facility] 11/25/2007: "normal". Ddoes not report MRI, MRA, MRV being done - 2nd opinon by Dr. Leonie Man - reportdly had MRI/MRV both normal; followup pending   Pulmonary sarcoidosis ([place])    - Arlyce Harman 07/25/2010 on pred 10/mtx 10 ->  FEv1 1.84L/775%, FVC 2.25L - SPiro 09/06/2010 -> Fev1 2.14L/81%. Cut pred to '5mg'$  per day. Cont mtx - SPiro 01/20/2011 - Fev1 1.6L/61%, Ratio 74%. CXR Worse -> increased pred to '10mg'$  perday. Cont Mtx.     Ventricular tachycardia ([place])    Vitamin D deficiency    - Aug 2011: 22 and low -> commence Rx. 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    Family History  Problem Relation Age of Onset   Allergies Mother    Diabetes Mother    Hypertension Mother    Hyperlipidemia Mother    Migraines Mother     Hyperlipidemia Father    Hypertension Father    Diabetes Father    Stroke Father    Hyperlipidemia Sister    Hypertension Sister    Meniere's disease Sister    Diabetes Sister    COPD Maternal Grandfather    Leukemia Paternal Grandmother    Heart attack Paternal Grandfather    Colon cancer Neg Hx    Esophageal cancer Neg Hx    Rectal cancer Neg Hx    Stomach cancer Neg Hx      Social History   Tobacco Use   Smoking status: Former    Packs/day: 1.00    Years: 15.00    Total pack years: 15.00    Types: Cigarettes    Quit date: 03/03/2007    Years since quitting: 15.6   Smokeless tobacco: Never  Vaping Use   Vaping Use: Never used  Substance Use Topics   Alcohol use: No    Alcohol/week: 0.0 standard drinks of alcohol   Drug use: No    Current Outpatient Medications on File Prior to Visit  Medication Sig Dispense Refill   albuterol (VENTOLIN HFA) 108 (90 Base) MCG/ACT inhaler Inhale 2 puffs into the lungs every 6 (six) hours as needed for wheezing or shortness of breath. 8 g 1   Galcanezumab-gnlm (EMGALITY) 120 MG/ML SOSY INJECT '120MG'$  UNDER THE SKIN EVERY 30 DAYS 1 mL 11   ibuprofen (ADVIL,MOTRIN) 200 MG tablet Take 200-600 mg by mouth daily as needed for headache or moderate pain.     Multiple Vitamin (MULTIVITAMIN WITH MINERALS) TABS tablet Take 1 tablet by mouth daily.     Rimegepant Sulfate (NURTEC) 75 MG TBDP Take 75 mg by mouth daily as needed. 16 tablet 11   topiramate (TOPAMAX) 25 MG tablet Take 1 tablet (25 mg total) by mouth at bedtime. 30 tablet 8   No current facility-administered medications on file prior to visit.     Review of Systems  Constitutional: Negative.   HENT: Negative.    Eyes: Negative.   Respiratory: Negative.    Cardiovascular: Negative.   Gastrointestinal: Negative.   Genitourinary: Negative.   Musculoskeletal:  Positive for arthralgias (Right hip, better).  Skin: Negative.   Neurological: Negative.   Psychiatric/Behavioral: Negative.         Objective:    BP 122/88 (BP Location: Right Arm, Patient  Position: Sitting)   Pulse (!) 108   Temp 97.6 F (36.4 C)   Wt 219 lb 3.2 oz (99.4 kg)   LMP 12/25/2006   SpO2 93%   BMI 37.63 kg/m   Wt Readings from Last 3 Encounters:  10/20/22 219 lb 3.2 oz (99.4 kg)  10/06/22 222 lb 6.4 oz (100.9 kg)  02/02/22 221 lb 6.4 oz (100.4 kg)    BP Readings from Last 3 Encounters:  10/20/22 122/88  10/06/22 (!) 143/82  03/07/22 126/81    Physical Exam Vitals and nursing note reviewed.  Constitutional:      General: She is not in acute distress.    Appearance: Normal appearance.  HENT:     Head: Normocephalic.     Right Ear: Tympanic membrane, ear canal and external ear normal.     Left Ear: Tympanic membrane, ear canal and external ear normal.  Eyes:     Conjunctiva/sclera: Conjunctivae normal.  Cardiovascular:     Rate and Rhythm: Normal rate and regular rhythm.     Pulses: Normal pulses.     Heart sounds: Normal heart sounds.  Pulmonary:     Effort: Pulmonary effort is normal.     Breath sounds: Normal breath sounds.  Abdominal:     Palpations: Abdomen is soft.     Tenderness: There is no abdominal tenderness.  Musculoskeletal:     Cervical back: Normal range of motion and neck supple. No tenderness.  Lymphadenopathy:     Cervical: No cervical adenopathy.  Skin:    General: Skin is warm.  Neurological:     General: No focal deficit present.     Mental Status: She is alert and oriented to person, place, and time.  Psychiatric:        Mood and Affect: Mood normal.        Behavior: Behavior normal.        Thought Content: Thought content normal.        Judgment: Judgment normal.       Assessment & Plan:   Problem List Items Addressed This Visit       Cardiovascular and Mediastinum   Cardiomyopathy, secondary-sarcoid    Chronic, stable.  She is currently following with cardiology and has an ICD in place.  We will check CMP, CBC, lipid panel today       Relevant Orders   Comprehensive metabolic panel   CBC   Lipid panel   Migraine - Primary    Chronic, stable.  She is currently following with neurology.  She is taking Emgality, Topamax 25 mg at bedtime, and Nurtec as needed.  She currently gets about 3 migraines per month.  Continue recommendations in collaboration with neurology.        Other   Vitamin D deficiency    She has a history of vitamin D deficiency, will check levels and treat based on results.      Relevant Orders   VITAMIN D 25 Hydroxy (Vit-D Deficiency, Fractures)   Other Visit Diagnoses     IFG (impaired fasting glucose)       Last glucose was elevated in labs, will check A1c today.   Relevant Orders   Comprehensive metabolic panel   Hemoglobin A1c        Follow up plan: Return in about 3 months (around 01/20/2023) for 3-6 months , CPE.

## 2022-10-20 NOTE — Patient Instructions (Signed)
It was great to see you!  We are checking your labs today and will let you know the results via mychart/phone.   Let's follow-up in 3-6 months, sooner if you have concerns.  If a referral was placed today, you will be contacted for an appointment. Please note that routine referrals can sometimes take up to 3-4 weeks to process. Please call our office if you haven't heard anything after this time frame.  Take care,  Vance Peper, NP

## 2022-10-20 NOTE — Assessment & Plan Note (Signed)
She has a history of vitamin D deficiency, will check levels and treat based on results.

## 2022-12-19 ENCOUNTER — Ambulatory Visit: Payer: BC Managed Care – PPO | Attending: Internal Medicine | Admitting: Internal Medicine

## 2022-12-19 ENCOUNTER — Encounter: Payer: Self-pay | Admitting: Internal Medicine

## 2022-12-19 VITALS — BP 126/90 | HR 85 | Ht 64.5 in | Wt 223.0 lb

## 2022-12-19 DIAGNOSIS — R000 Tachycardia, unspecified: Secondary | ICD-10-CM

## 2022-12-19 DIAGNOSIS — Z95810 Presence of automatic (implantable) cardiac defibrillator: Secondary | ICD-10-CM | POA: Diagnosis not present

## 2022-12-19 DIAGNOSIS — I429 Cardiomyopathy, unspecified: Secondary | ICD-10-CM | POA: Diagnosis not present

## 2022-12-19 DIAGNOSIS — I4729 Other ventricular tachycardia: Secondary | ICD-10-CM | POA: Diagnosis not present

## 2022-12-19 NOTE — Progress Notes (Signed)
Patient Care Team: Charyl Dancer, NP as PCP - General (Internal Medicine) Deboraha Sprang, MD as Consulting Physician (Cardiology) Pieter Partridge, DO as Consulting Physician (Neurology)   HPI  Alexandra Hood is a 51 y.o. female Is seen in followup for ventricular tachycardia noted on the monitor.  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   She is s/p ICD implantation 3/12 and gen replacement 9/19   Negative sleep study a few years ago  The patient denies chest pain, nocturnal dyspnea, orthopnea or peripheral edema.  There have been no palpitations, lightheadedness or syncope.  Complains of dyspnea on exertion limiting to less than a flight of stairs or 100 yards..    Used to wear sunglasses because of pseudotumor cerebri; headaches are better  DATE TEST EF   2/12 cMRI 50% +LGE             Date Cr K Hgb  10/23 0.67 4.1 13.0         Past Medical History:  Diagnosis Date   Headache    Lupus pernio    -skin bx confirmed at [facility] 05/07/2009 (personally reviewed) -looks improved 07/09/2009 -   Pertussis    Pseudotumor cerebri    .Marland KitchenMarland KitchenDr Sabra Heck and Dr. Katy Fitch - dxed Jan 2009, on diamox  - LP 01/24/2008 at [facility] - Normal ( pressure 14cm, gluc 68, Protein 23, Clear, colorless, RBC 3, WBC - too few to count)  - CT head non contrast done at [facility] 11/25/2007: "normal". Ddoes not report MRI, MRA, MRV being done - 2nd opinon by Dr. Leonie Man - reportdly had MRI/MRV both normal; followup pending   Pulmonary sarcoidosis ([place])    - Arlyce Harman 07/25/2010 on pred 10/mtx 10 -> FEv1 1.84L/775%, FVC 2.25L - SPiro 09/06/2010 -> Fev1 2.14L/81%. Cut pred to '5mg'$  per day. Cont mtx - SPiro 01/20/2011 - Fev1 1.6L/61%, Ratio 74%. CXR Worse -> increased pred to '10mg'$  perday. Cont Mtx.     Ventricular tachycardia ([place])    Vitamin D deficiency    - Aug 2011: 22 and low -> commence Rx. 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 8 g 1   Galcanezumab-gnlm (EMGALITY) 120 MG/ML SOSY INJECT '120MG'$  UNDER THE SKIN EVERY 30 DAYS 1 mL 11   ibuprofen (ADVIL,MOTRIN) 200 MG tablet Take 200-600 mg by mouth daily as needed for headache or moderate pain.     Multiple Vitamin (MULTIVITAMIN WITH MINERALS) TABS tablet Take 1 tablet by mouth daily.     Rimegepant Sulfate (NURTEC) 75 MG TBDP Take 75 mg by mouth daily as needed. 16 tablet 11   topiramate (TOPAMAX) 25 MG tablet Take 1 tablet (25 mg  total) by mouth at bedtime. 30 tablet 8   Vitamin D, Ergocalciferol, (DRISDOL) 1.25 MG (50000 UNIT) CAPS capsule Take 1 capsule (50,000 Units total) by mouth every 7 (seven) days. 12 capsule 1   No current facility-administered medications for this visit.    No Known Allergies  Review of Systems negative except from HPI and PMH  Physical Exam BP (!) 126/90   Pulse 85   Ht 5' 4.5" (1.638 m)   Wt 223 lb (101.2 kg)   LMP 12/25/2006   SpO2 97%   BMI 37.69 kg/m    Well developed and well nourished in no acute distress HENT normal Neck supple with JVP-flat Clear Device pocket well healed; without hematoma or erythema.  There is no tethering  Regular rate and rhythm, no  gallop No murmur Abd-soft with  active BS No Clubbing cyanosis  edema Skin-warm and dry A & Oriented  Grossly normal sensory and motor function  ECG sinus  85 15/07/37  Device function is normal.Programming changes  none  See Paceart for details    Assessment and  Plan   Ventricular tachycardia   Elevated sinus rate  Implantable defibrillator-Medtronic   Cardiomyopathy-sarcoid  Pulmonary sarcoid   Dypsnea on exertion   Obese  No intercurrent ventricular tachycardia. , Blood pressure is well-controlled, not requiring medications.  Has a history of cardiac and pulmonary sarcoid.  With her dyspnea, I have reached out to Dr. Chase Caller as to the next pulmonary assessment, from a cardiac point of view with her depressed left ventricular function, borderline, we will check her LV function and embark on guideline directed therapy.  She is exercising.

## 2022-12-19 NOTE — Patient Instructions (Addendum)
Medication Instructions:  The current medical regimen is effective;  continue present plan and medications.  *If you need a refill on your cardiac medications before your next appointment, please call your pharmacy*  Testing/Procedures: Your physician has requested that you have an echocardiogram. Echocardiography is a painless test that uses sound waves to create images of your heart. It provides your doctor with information about the size and shape of your heart and how well your heart's chambers and valves are working. This procedure takes approximately one hour. There are no restrictions for this procedure. Please do NOT wear cologne, perfume, aftershave, or lotions (deodorant is allowed). Please arrive 15 minutes prior to your appointment time.  Follow-Up: At [facility], you and your health needs are our priority.  As part of our continuing mission to provide you with exceptional heart care, we have created designated Provider Care Teams.  These Care Teams include your primary Cardiologist (physician) and Advanced Practice Providers (APPs -  Physician Assistants and Nurse Practitioners) who all work together to provide you with the care you need, when you need it.  We recommend signing up for the patient portal called "MyChart".  Sign up information is provided on this After Visit Summary.  MyChart is used to connect with patients for Virtual Visits (Telemedicine).  Patients are able to view lab/test results, encounter notes, upcoming appointments, etc.  Non-urgent messages can be sent to your provider as well.   To learn more about what you can do with MyChart, go to [email or web address].    Your next appointment:   1 year(s)  The format for your next appointment:   In Person  Provider:   Dr Caryl Comes      Important Information About Sugar

## 2022-12-20 ENCOUNTER — Telehealth: Payer: Self-pay | Admitting: Internal Medicine

## 2022-12-20 DIAGNOSIS — I429 Cardiomyopathy, unspecified: Secondary | ICD-10-CM

## 2022-12-20 NOTE — Telephone Encounter (Signed)
Been years since I saw Alexandra Hood  But Dr Caryl Comes cardiollgist sent message sayikng she has class 3 dyspnea  Plan  0 get full PFT - get HRCT supine and prine - first availw ith me 30 min - likely new patient sarcoid

## 2022-12-21 ENCOUNTER — Ambulatory Visit (INDEPENDENT_AMBULATORY_CARE_PROVIDER_SITE_OTHER): Payer: BC Managed Care – PPO

## 2022-12-21 DIAGNOSIS — I429 Cardiomyopathy, unspecified: Secondary | ICD-10-CM | POA: Diagnosis not present

## 2022-12-21 LAB — CUP PACEART REMOTE DEVICE CHECK
Battery Remaining Longevity: 54 mo
Battery Voltage: 2.93 V
Brady Statistic AP VP Percent: 0 %
Brady Statistic AP VS Percent: 0.01 %
Brady Statistic AS VP Percent: 0.04 %
Brady Statistic AS VS Percent: 99.95 %
Brady Statistic RA Percent Paced: 0.01 %
Brady Statistic RV Percent Paced: 0.04 %
Date Time Interrogation Session: 20231228072501
HighPow Impedance: 42 Ohm
HighPow Impedance: 55 Ohm
Implantable Lead Connection Status: 753985
Implantable Lead Connection Status: 753985
Implantable Lead Implant Date: 20120308
Implantable Lead Implant Date: 20120308
Implantable Lead Location: 753859
Implantable Lead Location: 753860
Implantable Lead Model: 5076
Implantable Lead Model: 6947
Implantable Pulse Generator Implant Date: 20190913
Lead Channel Impedance Value: 266 Ohm
Lead Channel Impedance Value: 380 Ohm
Lead Channel Impedance Value: 437 Ohm
Lead Channel Pacing Threshold Amplitude: 0.375 V
Lead Channel Pacing Threshold Amplitude: 0.75 V
Lead Channel Pacing Threshold Pulse Width: 0.4 ms
Lead Channel Pacing Threshold Pulse Width: 0.4 ms
Lead Channel Sensing Intrinsic Amplitude: 3 mV
Lead Channel Sensing Intrinsic Amplitude: 3 mV
Lead Channel Sensing Intrinsic Amplitude: 4.125 mV
Lead Channel Sensing Intrinsic Amplitude: 4.125 mV
Lead Channel Setting Pacing Amplitude: 1.5 V
Lead Channel Setting Pacing Amplitude: 2 V
Lead Channel Setting Pacing Pulse Width: 0.4 ms
Lead Channel Setting Sensing Sensitivity: 0.45 mV
Zone Setting Status: 755011
Zone Setting Status: 755011

## 2022-12-21 NOTE — Telephone Encounter (Signed)
Left voicemail and sent mychart message

## 2022-12-22 NOTE — Telephone Encounter (Signed)
Patient is scheduled for PFT on 1/19 and consult with MR on 1/23 @ 8:30am. Please advise on PFT and HRCT orders

## 2022-12-22 NOTE — Telephone Encounter (Signed)
Orders placed. Nothing further needed at this time

## 2022-12-29 ENCOUNTER — Ambulatory Visit
Admission: RE | Admit: 2022-12-29 | Discharge: 2022-12-29 | Disposition: A | Discharge status: Home / Self Care | Payer: BC Managed Care – PPO | Source: Ambulatory Visit | Attending: Internal Medicine | Admitting: Internal Medicine

## 2022-12-29 DIAGNOSIS — I429 Cardiomyopathy, unspecified: Secondary | ICD-10-CM

## 2023-01-07 NOTE — Progress Notes (Signed)
Sarcoid burden in lung is minimal and no change since 2019. Good news. Some emphyema persent. Overall good news from lung standpoint. Will see her afte rPFT. Pls let her know

## 2023-01-08 NOTE — Progress Notes (Signed)
Remote ICD transmission.   

## 2023-01-12 ENCOUNTER — Ambulatory Visit: Payer: BC Managed Care – PPO | Admitting: Internal Medicine

## 2023-01-12 DIAGNOSIS — I429 Cardiomyopathy, unspecified: Secondary | ICD-10-CM

## 2023-01-12 LAB — PULMONARY FUNCTION TEST
DL/VA % pred: 139 %
DL/VA: 6 ml/min/mmHg/L
DLCO cor % pred: 95 %
DLCO cor: 20.58 ml/min/mmHg
DLCO unc % pred: 95 %
DLCO unc: 20.58 ml/min/mmHg
FEF 25-75 Post: 3.27 L/sec
FEF 25-75 Pre: 2.59 L/sec
FEF2575-%Change-Post: 26 %
FEF2575-%Pred-Post: 115 %
FEF2575-%Pred-Pre: 91 %
FEV1-%Change-Post: 6 %
FEV1-%Pred-Post: 66 %
FEV1-%Pred-Pre: 62 %
FEV1-Post: 1.9 L
FEV1-Pre: 1.79 L
FEV1FVC-%Change-Post: 1 %
FEV1FVC-%Pred-Pre: 109 %
FEV6-%Change-Post: 4 %
FEV6-%Pred-Post: 59 %
FEV6-%Pred-Pre: 57 %
FEV6-Post: 2.12 L
FEV6-Pre: 2.02 L
FEV6FVC-%Pred-Post: 102 %
FEV6FVC-%Pred-Pre: 102 %
FVC-%Change-Post: 4 %
FVC-%Pred-Post: 58 %
FVC-%Pred-Pre: 55 %
FVC-Post: 2.12 L
FVC-Pre: 2.02 L
Post FEV1/FVC ratio: 90 %
Post FEV6/FVC ratio: 100 %
Pre FEV1/FVC ratio: 88 %
Pre FEV6/FVC Ratio: 100 %
RV % pred: 84 %
RV: 1.51 L
TLC % pred: 74 %
TLC: 3.82 L

## 2023-01-12 NOTE — Progress Notes (Signed)
Full PFT performed today.

## 2023-01-12 NOTE — Patient Instructions (Signed)
Full PFT performed today.

## 2023-01-16 ENCOUNTER — Encounter: Payer: Self-pay | Admitting: Internal Medicine

## 2023-01-16 ENCOUNTER — Telehealth: Payer: Self-pay | Admitting: Internal Medicine

## 2023-01-16 ENCOUNTER — Ambulatory Visit: Payer: BC Managed Care – PPO | Admitting: Internal Medicine

## 2023-01-16 VITALS — BP 126/78 | HR 98 | Ht 65.0 in | Wt 223.8 lb

## 2023-01-16 DIAGNOSIS — Z8679 Personal history of other diseases of the circulatory system: Secondary | ICD-10-CM

## 2023-01-16 DIAGNOSIS — I2510 Atherosclerotic heart disease of native coronary artery without angina pectoris: Secondary | ICD-10-CM

## 2023-01-16 DIAGNOSIS — J439 Emphysema, unspecified: Secondary | ICD-10-CM

## 2023-01-16 DIAGNOSIS — D860 Sarcoidosis of lung: Secondary | ICD-10-CM

## 2023-01-16 DIAGNOSIS — Z87891 Personal history of nicotine dependence: Secondary | ICD-10-CM

## 2023-01-16 DIAGNOSIS — R0609 Other forms of dyspnea: Secondary | ICD-10-CM | POA: Diagnosis not present

## 2023-01-16 MED ORDER — SPIRIVA RESPIMAT 1.25 MCG/ACT IN AERS: 2.0000 | INHALATION_SPRAY | Freq: Every day | RESPIRATORY_TRACT | 0 refills | Status: DC

## 2023-01-16 NOTE — Progress Notes (Addendum)
OV 07/06/2014  Chief Complaint  Patient presents with   Follow-up    Pt states her breathing has slightly improved since last OV, pt is able to do more activities. Pt c/o RUQ pain with inspiration. Denies cough and CP/tightness. PT states she has DVT in left lower leg that pt stated she noticed last week.    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 I cut the Medrol to 2 mg once daily. Then on 05/22/2014 she went to the emergency room with abdominal pain right upper quadrant. Apparently this time she did have some fever low grade along with some yellow sputum, right pleuritic chest pain and shortness of breath. CT scan of the abdomen did show some right lower lobe pulmonary infiltrates and she was given Z-Pak. After these all the symptoms resolved except for the right pleuritic chest pain which has largely improved although still persistent and is mild. She did followup with my nurse practitioner 05/28/2014 and had a chest x-ray that shows prominence in the right lower lobe infiltrates suggesting the presence of pneumonia. She had ACEl checked which was 80 and elevated. She's been asked to take Medrol at 4 mg once daily which is doing currently . Overall she is well . Spirometry today is baseline with an FVC of 2.3 the/78%. FEV1 of 1.88 L or 74% and a ratio of 80/96%.   OF note a few days ago she did notice that both calves swelled up and then spontaneously settled. She thinks she had a "blood clot" was no worsening dyspnea or cough.    #sarcoidosis  - continue medrol at '4mg'$  per day  #Pneumoia Right lower lobe 05/22/14  - glad you are better but we need to keep an eye on the right sided pain  - if pain gets worse call us  - Do CT chest wo contrast mid-end Aug 2015  #Followup -  - Do CT chest wo contrast mid-end Aug 2015 - return to see me or my NP after CT   OV 08/17/2014  Chief Complaint  Patient  presents with   Follow-up    review CT results. Pt denies any increase in SOB, cough, chest tightness.    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 But she had "pneumonia" and some chest pain in spring/summer. So we decided to get CT chest aug 2015. CT shows improvement in UL sarcoid and down to a scar compared to 2010 but lower lobe she has worsening nodular disease. She herself is asymptomatic and feeling well and more keen on tapering off steroids. Denies new problems. 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  However, there are multiple peribronchovascular areas of ground glass and nodularity, in a perilymphatic distribution, new from 2010 but seen on CT AP 05/22/14 (lung bases).  Looks to me like sarcoid progression.  Atypical infection is considered less likely.   OV 01/22/2015 Chief Complaint  Patient presents with   Follow-up    Pt stated her breathing has improved since last OV. Pt stated her SOB has improved and she does not become as SOB as before. Pt denies cough and CP/tightness.    52 year old female with pulmonary  sarcoidosis stage II with ACD placement  I'm known her for several years. She used to be on methotrexate. 3 years ago she had normal function and on high-dose of steroids. Last time I saw her was in August 2015 at which time she was asymptomatic and was expressing a strong desire for coming off steroids. However CT scan of the chest at that time showed that she had new worsening sarcoidosis in terms of nodules in the lower lobes even though the upper lobe sarcoidosis: Original problem was improved to just a scar tissue. Currently she tells me she feels the best ever while she is being maintained on Medrol 4 mg per day [she does not like  prednisone]. She's asymptomatic. She had a CT scan of the chest in December 2015 that shows stability compared to August 2015 but is definitely worse compared to 2010. We did a spirometry today and it shows continued decline of lung function with an FEV1 of 1.6 L/66%. She is very surprised by this the technique was good and she is asymptomatic. She is really reluctant to go up on the steroids add methotrexate again  OV 11/12/2015  Chief Complaint  Patient presents with   Follow-up    Pt states she has had a dry cough recently, pt thinks this is due to the [place] air. Pt denies SOB and CP/tightness.    Follow-up pulmonary sarcoidosis stage II with AICD placement  Last seen January 2016. She is now maintained on Medrol 4 mg daily. In January 2016 FEV1 was 1.6 L/66%. She was asymptomatic at that time. Even at that time she was off methotrexate. There was concern for worsening sarcoidosis but after discussion we adopted a weight and watch approach. She currently continues Medrol 4 mg per day. She tells me she continues to be asymptomatic. Spirometry today shows FEV1 1.7 L/67%. There is similar to January 2016. She is a little bit worried about the obstruction in lung function. She is open to re-challenging herself with methotrexate though she does not want to go up on steroids. She wants have a flu shot and pneumonia shot today she continues to work at [employer] in [place] 01/16/2023  Subjective:  Patient ID: Alexandra Hood, female , DOB: Oct 22, 1971 , age 52 y.o. , MRN: [id number] , ADDRESS: [place] [id number] PCP Charyl Dancer, NP Patient Care Team: Charyl Dancer, NP as PCP - General (Internal Medicine) Deboraha Sprang, MD as Consulting Physician (Cardiology) Pieter Partridge, DO as Consulting Physician (Neurology)  This Provider for this visit: Treatment Team:  Attending Provider: Brand Males, MD    01/16/2023 -   Chief  Complaint  Patient presents with   Consult    Pt is here to reestablish care for pulmonary sarcoidosis. Last seen in 2016. Pt states she was doing well until she got covid in September. She states she has more episodes of SOB now. Pt did have albuterol as needed but is out. She states it did help some.      HPI Ameliyah Hood 52 y.o. -has a history of pulmonary sarcoidosis.  I personally have not seen her in over 6 years and therefore is a new consult.  Room was seeing her and all clinically had on methotrexate and prednisone.  At that point sarcoid stabilized and she came off methotrexate and prednisone and then she was on observation.  She also had status post defibrillator placed in 2012.  She tells me  in the last 5-6 issues been doing well.  Stable.  Did not feel her sarcoid in the skin or in the lungs were active.  She had a first bout of COVID-19 in September 2023.  Was mild outpatient COVID.  Did not require oxygen.  But then after that she started noticing when she walked her dog she would get short of breath and stop.  This continues to this day.  It is mild to moderate in intensity relieved by exertion.  Not getting worse not getting better.  No leg edema no hemoptysis no chest pain no cough no wheezing no orthopnea no paroxysmal nocturnal dyspnea.  She continues to work in [employer].  She plans to retire in a few years.  Of note she was a previous smoker.  She has seen cardiology was recommended repeat echo and a follow-up pending.  She had a high-resolution CT scan of the chest: She just has old scar tissue that I personally visualized and agree with the findings.  Her pulmonary function test DLCO stable although FVC is declined around 2% a year.  She has some mild emphysema as well.  She suffers from obesity and the weight is stable.  She does not have any interim diabetes.     2016: weight 226# 01/16/2023 - weig 223.8# CT Chest data - HRCT 12/29/22  Narrative &  Impression  CLINICAL DATA:  Sarcoid.   EXAM: CT CHEST WITHOUT CONTRAST   TECHNIQUE: Multidetector CT imaging of the chest was performed following the standard protocol without intravenous contrast. High resolution imaging of the lungs, as well as inspiratory and expiratory imaging, was performed.   RADIATION DOSE REDUCTION: This exam was performed according to the departmental dose-optimization program which includes automated exposure control, adjustment of the mA and/or kV according to patient size and/or use of iterative reconstruction technique.   COMPARISON:  11/25/2018.   FINDINGS: Cardiovascular: Left anterior descending coronary artery calcification. Heart is at the upper limits of normal in size. No pericardial effusion.   Mediastinum/Nodes: No pathologically enlarged mediastinal or axillary lymph nodes. Hilar regions are difficult to definitively evaluate without IV contrast. Esophagus is grossly unremarkable.   Lungs/Pleura: Centrilobular and paraseptal emphysema. Minimal scattered peribronchovascular nodularity and ground-glass, as before. Scarring in the upper lobes. No pleural fluid. Minimal adherent debris in the airway.   Upper Abdomen: Liver is somewhat heterogeneous in attenuation. Visualized portions of the liver, gallbladder, adrenal glands, kidneys, spleen, pancreas, stomach and bowel are otherwise unremarkable. Upper abdominal lymph nodes are not enlarged by CT size criteria.   Musculoskeletal: Degenerative changes in the spine. No worrisome lytic or sclerotic lesions.   IMPRESSION: 1. Minimal scattered peribronchovascular nodularity and ground-glass, similar and compatible with the provided history of sarcoid. 2. Left anterior descending coronary artery calcification. 3. Liver appears steatotic. 4.  Emphysema (ICD10-J43.9).   1.     Electronically Signed   By: Lorin Picket M.D.   On: 01/01/2023 11:51    No results found.     Modified Six Minute Walk - 01/16/23 0900     Type of O2 used  Room Air    Number of laps completed  3    Lap Pace Moderate    Resting Heartrate 88 bpm    Final Heartrate 96 bpm    Resting Pulse Ox 100 %    Desaturated to <= 3 points No    Desaturated to < 88% No    Became tachycardic No    Symptoms  no symptoms noted from patient.    Was the O2 correction test done? No    comments lap1: 100%/110, lap2:100%/119, lap3:100%/107, final 100%/96               PFT     Latest Ref Rng & Units 01/12/2023   11:02 AM 03/24/2015    1:50 PM  PFT Results  FVC-Pre L 2.02    FVC-Predicted Pre % 55  76   FVC-Post L 2.12  2.41   FVC-Predicted Post % 58  76   Pre FEV1/FVC % % 88  86   Post FEV1/FCV % % 90  87   FEV1-Pre L 1.79  2.07   FEV1-Predicted Pre % 62  79   FEV1-Post L 1.90  2.11   DLCO uncorrected ml/min/mmHg 20.58  17.92   DLCO UNC% % 95  69   DLCO corrected ml/min/mmHg 20.58    DLCO COR %Predicted % 95    DLVA Predicted % 139  110   TLC L 3.82  3.15   TLC % Predicted % 74  60   RV % Predicted % 84  49        has a past medical history of Headache, Lupus pernio, Pertussis, Pseudotumor cerebri, Pulmonary sarcoidosis ([place]), Ventricular tachycardia ([place]), and Vitamin D deficiency.   reports that she quit smoking about 15 years ago. Her smoking use included cigarettes. She has a 15.00 pack-year smoking history. She has never used smokeless tobacco.  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    No Known Allergies  Immunization History  Administered Date(s) Administered   Influenza Split 09/24/2014   Influenza Whole 09/29/2009, 09/26/2010, 09/25/2011   Influenza,inj,Quad PF,6+ Mos 10/14/2013, 11/12/2015, 09/30/2019   PFIZER(Purple Top)SARS-COV-2 Vaccination 03/01/2020, 03/22/2020   Pneumococcal Conjugate-13 11/12/2015   Pneumococcal-Unspecified 12/25/2009   Td 02/10/2011    Family History  Problem Relation Age of Onset   Allergies Mother    Diabetes Mother    Hypertension Mother    Hyperlipidemia Mother    Migraines Mother    Hyperlipidemia Father    Hypertension Father    Diabetes Father    Stroke Father    Hyperlipidemia Sister    Hypertension Sister    Meniere's disease Sister    Diabetes Sister    COPD Maternal Grandfather    Leukemia Paternal Grandmother    Heart attack Paternal Grandfather    Colon cancer Neg Hx    Esophageal cancer Neg Hx    Rectal cancer Neg Hx    Stomach cancer Neg Hx      Current Outpatient Medications:    acetaZOLAMIDE (DIAMOX) 250 MG tablet, Take 250 mg by mouth 2 (two) times daily., Disp: , Rfl:    Galcanezumab-gnlm (EMGALITY) 120 MG/ML SOSY, INJECT '120MG'$  UNDER THE SKIN EVERY 30 DAYS, Disp: 1 mL, Rfl: 11   ibuprofen (ADVIL,MOTRIN) 200 MG tablet, Take 200-600 mg by mouth daily as needed for headache or moderate pain., Disp: , Rfl:    Multiple Vitamin (MULTIVITAMIN WITH  MINERALS) TABS tablet, Take 1 tablet by mouth daily., Disp: , Rfl:    Rimegepant Sulfate (NURTEC) 75 MG TBDP, Take 75 mg by mouth daily as needed., Disp: 16 tablet, Rfl: 11   topiramate (TOPAMAX) 25 MG tablet, Take 1 tablet (25 mg total) by mouth at bedtime., Disp: 30 tablet, Rfl: 8   Vitamin D, Ergocalciferol, (DRISDOL) 1.25 MG (50000 UNIT) CAPS capsule, Take 1 capsule (50,000 Units total) by mouth every 7 (seven) days., Disp: 12 capsule, Rfl: 1   albuterol (VENTOLIN HFA) 108 (90 Base) MCG/ACT inhaler, Inhale 2 puffs into the lungs every 6 (six)  hours as needed for wheezing or shortness of breath. (Patient not taking: Reported on 01/16/2023), Disp: 8 g, Rfl: 1      Objective:   Vitals:   01/16/23 0828  BP: 126/78  Pulse: 98  SpO2: 99%  Weight: 223 lb 12.8 oz (101.5 kg)  Height: '5\' 5"'$  (1.651 m)    Estimated body mass index is 37.24 kg/m as calculated from the following:   Height as of this encounter: '5\' 5"'$  (1.651 m).   Weight as of this encounter: 223 lb 12.8 oz (101.5 kg).  '@WEIGHTCHANGE'$ @  [employer]   01/16/23 0828  Weight: 223 lb 12.8 oz (101.5 kg)     Physical Exam  General: No distress. Obese + Neuro: Alert and Oriented x 3. GCS 15. Speech normal Psych: Pleasant Resp:  Barrel Chest - no.  Wheeze - no, Crackles - no, No overt respiratory distress CVS: Normal heart sounds. Murmurs - no Ext: Stigmata of Connective Tissue Disease - no HEENT: Normal upper airway. PEERL +. No post nasal drip        Assessment:       ICD-10-CM   1. DOE (dyspnea on exertion)  R06.09     2. Pulmonary sarcoidosis ([place])  D86.0     3. History of cardiomyopathy  Z86.79     4. History of cigarette smoking  Z87.891     5. Pulmonary emphysema, unspecified emphysema type ([place])  J43.9     6. Coronary artery calcification seen on CAT scan  I25.10          Plan:     Patient Instructions     ICD-10-CM   1. DOE (dyspnea on exertion)  R06.09     2. Pulmonary sarcoidosis ([place])  D86.0     3. History of cardiomyopathy  Z86.79     4. History of cigarette smoking  Z87.891     5. Pulmonary emphysema, unspecified emphysema type ([place])  J43.9      You are having post-COVID shortness of breath which I suspect could be COVID long-haul or physical deconditioning or cardiac issues.  Possible but unlikely some background mild emphysema due to prior smoking is contributing to her shortness of breath.  The pulmonary sarcoidosis is not active.  Pulmonary function test appears stable/mildly decline possibly due to aging  Plan  -  Start empiric Spiriva Respimat 2 puffs once daily - Keep up cardiac workup with Dr. Caryl Comes  -Echo and possibly cardiac stress test  -If cardiac workup is negative would consider pulmonary stress test  Follow-up - Call us back in 4 weeks if the Spiriva is helping you -Return in 3 months to see Dr. Chase Caller or sooner if needed    SIGNATURE    Dr. Brand Males, M.D., F.C.C.P,  Pulmonary and Critical Care Medicine Staff Physician, [facility] Director - Interstitial Lung Disease  Program  Pulmonary  [place] at [place], [place], [address]  Pager: [phone], If no answer or between  15:00h - 7:00h: call [phone] Telephone: [phone]  9:05 AM 01/16/2023

## 2023-01-16 NOTE — Telephone Encounter (Signed)
Noted Echo is already ordered

## 2023-01-16 NOTE — Patient Instructions (Addendum)
ICD-10-CM   1. DOE (dyspnea on exertion)  R06.09     2. Pulmonary sarcoidosis ([place])  D86.0     3. History of cardiomyopathy  Z86.79     4. History of cigarette smoking  Z87.891     5. Pulmonary emphysema, unspecified emphysema type ([place])  J43.9      You are having post-COVID shortness of breath which I suspect could be COVID long-haul or physical deconditioning or cardiac issues.  Possible but unlikely some background mild emphysema due to prior smoking is contributing to her shortness of breath.  The pulmonary sarcoidosis is not active.  Pulmonary function test appears stable/mildly decline possibly due to aging  Plan  - Start empiric Spiriva Respimat 2 puffs once daily - Keep up cardiac workup with Dr. Caryl Comes  -Echo and possibly cardiac stress test  -If cardiac workup is negative would consider pulmonary stress test  Follow-up - Call us back in 4 weeks if the Spiriva is helping you -Return in 3 months to see Dr. Chase Caller or sooner if needed

## 2023-01-16 NOTE — Telephone Encounter (Signed)
Jerlyn Ly Dumas-Hightower - no active sarcoid. She has coronary arterly calcification. I recommend echo +/- cardiac stress dependon on your eval. If This is negative we can see how the spiriva works (some emphysema on CT) +/- do pulm stress test  Thanks    SIGNATURE    Dr. Brand Males, M.D., F.C.C.P,  Pulmonary and Critical Care Medicine Staff Physician, [place] Director - Interstitial Lung Disease  Program  Medical Director - [place] [facility] Pulmonary [place] at [place], [place], [address]   Pager: [phone], If no answer  -[place] or [place] [phone] Telephone (clinical office): [phone] Telephone (research): [phone]  9:06 AM 01/16/2023

## 2023-01-16 NOTE — Addendum Note (Signed)
Addended by: Gavin Potters R on: 01/16/2023 09:19 AM   Modules accepted: Orders

## 2023-01-17 ENCOUNTER — Ambulatory Visit (INDEPENDENT_AMBULATORY_CARE_PROVIDER_SITE_OTHER): Payer: BC Managed Care – PPO | Admitting: Nurse Practitioner

## 2023-01-17 ENCOUNTER — Encounter: Payer: Self-pay | Admitting: Nurse Practitioner

## 2023-01-17 VITALS — BP 150/100 | Temp 97.6°F | Ht 65.0 in | Wt 224.6 lb

## 2023-01-17 DIAGNOSIS — Z23 Encounter for immunization: Secondary | ICD-10-CM

## 2023-01-17 DIAGNOSIS — H6121 Impacted cerumen, right ear: Secondary | ICD-10-CM

## 2023-01-17 DIAGNOSIS — H6690 Otitis media, unspecified, unspecified ear: Secondary | ICD-10-CM | POA: Diagnosis not present

## 2023-01-17 DIAGNOSIS — R030 Elevated blood-pressure reading, without diagnosis of hypertension: Secondary | ICD-10-CM | POA: Diagnosis not present

## 2023-01-17 MED ORDER — AMOXICILLIN-POT CLAVULANATE 875-125 MG PO TABS: 1.0000 | ORAL_TABLET | Freq: Two times a day (BID) | ORAL | 0 refills | Status: DC

## 2023-01-17 MED ORDER — FLUCONAZOLE 150 MG PO TABS: ORAL_TABLET | ORAL | 0 refills | Status: DC

## 2023-01-17 NOTE — Progress Notes (Signed)
Acute Office Visit  Subjective:     Patient ID: Alexandra Hood, female    DOB: 1971-09-27, 52 y.o.   MRN: [id number]  Chief Complaint  Patient presents with   Acute Visit    Head congestion  right ear stopped up with some pain since Friday    HPI Patient is in today for right ear clogged since Friday (6 days).   EAR CLOGGED  Duration: days Involved ear(s): "right Sensation of feeling clogged/plugged: yes Decreased/muffled hearing:yes Ear pain: yes Fever: no Otorrhea: no Hearing loss: yes Upper respiratory infection symptoms: yes - started with nasal congestion, resolved now Using Q-Tips: yes Status: worse History of cerumenosis: yes Treatments attempted:  debrox  ROS See pertinent positives and negatives per HPI.     Objective:    BP (!) 150/100 (BP Location: Left Arm, Cuff Size: Large)   Temp 97.6 F (36.4 C) (Tympanic)   Ht '5\' 5"'$  (1.651 m)   Wt 224 lb 9.6 oz (101.9 kg)   LMP 12/25/2006   BMI 37.38 kg/m    Physical Exam Vitals and nursing note reviewed.  Constitutional:      General: She is not in acute distress.    Appearance: Normal appearance.  HENT:     Head: Normocephalic.     Right Ear: There is impacted cerumen.     Left Ear: Tympanic membrane, ear canal and external ear normal.  Eyes:     Conjunctiva/sclera: Conjunctivae normal.  Cardiovascular:     Rate and Rhythm: Normal rate and regular rhythm.     Pulses: Normal pulses.     Heart sounds: Normal heart sounds.  Pulmonary:     Effort: Pulmonary effort is normal.     Breath sounds: Normal breath sounds.  Musculoskeletal:     Cervical back: Normal range of motion.  Skin:    General: Skin is warm.  Neurological:     General: No focal deficit present.     Mental Status: She is alert and oriented to person, place, and time.  Psychiatric:        Mood and Affect: Mood normal.        Behavior: Behavior normal.        Thought Content: Thought content normal.        Judgment:  Judgment normal.      Assessment & Plan:   Problem List Items Addressed This Visit   None Visit Diagnoses     Impacted cerumen of right ear    -  Primary   After obtaining verbal consent, right ear irrigated with good results. She tolerated the procedure well.   Acute otitis media, unspecified otitis media type       Will treat with augmentin BID x10 days. Can take ibuprofen/tylenol as needed for pain.   Relevant Medications   amoxicillin-clavulanate (AUGMENTIN) 875-125 MG tablet   fluconazole (DIFLUCAN) 150 MG tablet   Elevated blood pressure reading       Blood pressure 150/100 today. Yesterday, BP was normal. Recommend she check her BP at home later today and call with the reading.   Need for influenza vaccination       Flu vaccine given today   Relevant Orders   Flu Vaccine QUAD 40moIM (Fluarix, Fluzone & Alfiuria Quad PF) (Completed)       Meds ordered this encounter  Medications   amoxicillin-clavulanate (AUGMENTIN) 875-125 MG tablet    Sig: Take 1 tablet by mouth 2 (two) times daily.    Dispense:  20 tablet    Refill:  0   fluconazole (DIFLUCAN) 150 MG tablet    Sig: Take 1 tablet after finishing antibiotic. May take second dose 3 days later.    Dispense:  2 tablet    Refill:  0    Return in about 6 months (around 07/18/2023) for chronic disease.  Charyl Dancer, NP

## 2023-01-17 NOTE — Patient Instructions (Addendum)
It was great to see you!  You can continue to use debrox as needed for your ears.   Start augmentin twice a day for 10 days. Then take diflucan after finishing.   Let's follow-up in 6 months, sooner if you have concerns.  If a referral was placed today, you will be contacted for an appointment. Please note that routine referrals can sometimes take up to 3-4 weeks to process. Please call our office if you haven't heard anything after this time frame.  Take care,  Vance Peper, NP

## 2023-01-18 ENCOUNTER — Ambulatory Visit (HOSPITAL_COMMUNITY): Payer: BC Managed Care – PPO | Attending: Internal Medicine

## 2023-01-18 DIAGNOSIS — I429 Cardiomyopathy, unspecified: Secondary | ICD-10-CM

## 2023-01-18 DIAGNOSIS — I428 Other cardiomyopathies: Secondary | ICD-10-CM

## 2023-01-18 LAB — ECHOCARDIOGRAM COMPLETE
Area-P 1/2: 4.79 cm2
S' Lateral: 3 cm

## 2023-01-21 ENCOUNTER — Encounter: Payer: Self-pay | Admitting: Nurse Practitioner

## 2023-01-22 ENCOUNTER — Other Ambulatory Visit: Payer: Self-pay | Admitting: Nurse Practitioner

## 2023-01-22 MED ORDER — AMLODIPINE BESYLATE 5 MG PO TABS: 5.0000 mg | ORAL_TABLET | Freq: Every day | ORAL | 1 refills | Status: DC

## 2023-01-28 ENCOUNTER — Other Ambulatory Visit: Payer: Self-pay | Admitting: Neurology

## 2023-02-05 ENCOUNTER — Ambulatory Visit: Payer: BC Managed Care – PPO | Admitting: Neurology

## 2023-02-08 ENCOUNTER — Other Ambulatory Visit: Payer: Self-pay | Admitting: Internal Medicine

## 2023-02-08 ENCOUNTER — Telehealth: Payer: Self-pay

## 2023-02-08 MED ORDER — SPIRONOLACTONE 25 MG PO TABS: 12.5000 mg | ORAL_TABLET | Freq: Every day | ORAL | 3 refills | Status: DC

## 2023-02-08 NOTE — Telephone Encounter (Signed)
-----   Message from Deboraha Sprang, MD sent at 02/02/2023  4:27 PM EST ----- Please Inform Patient Echo showed  low normal  heart muscle function  With her dyspnea, three thoughts  1) spiro 12.5 and farxiga 10 might be of benefit and is indicated in people like her with mild decrease heart muscle function and shortness of breath 2) the CT done by Dr Chase Caller showed some calcium in the LAD, perhaps this is significant >>Cardiac CTA 2) after DR Chase Caller is done, we can have her seen by the CHF team ( make it not sound bad)  Thanks SK

## 2023-02-08 NOTE — Telephone Encounter (Signed)
Spoke with pt and advised of echo results and recommendations per Dr Caryl Comes as below.  Pt verbalizes understanding and states she is willing to start Spironolactone 65m - 1/2 tablet (12.565m by mouth daily.  She will see how she does and consider Dr KlOlin Piather thoughts. She will let usKoreanow if she would like to proceed with anything further. Pt thanked RNTherapist, sportsor the call.

## 2023-02-09 ENCOUNTER — Other Ambulatory Visit (HOSPITAL_COMMUNITY): Payer: Self-pay

## 2023-02-09 ENCOUNTER — Encounter (HOSPITAL_COMMUNITY): Payer: Self-pay

## 2023-02-09 ENCOUNTER — Other Ambulatory Visit: Payer: Self-pay

## 2023-02-09 MED ORDER — SPIRIVA RESPIMAT 1.25 MCG/ACT IN AERS
2.0000 | INHALATION_SPRAY | Freq: Every day | RESPIRATORY_TRACT | 5 refills | Status: DC
  Filled 2023-02-09: qty 4, 30d supply, fill #0

## 2023-02-13 ENCOUNTER — Other Ambulatory Visit: Payer: Self-pay

## 2023-02-27 ENCOUNTER — Ambulatory Visit (INDEPENDENT_AMBULATORY_CARE_PROVIDER_SITE_OTHER): Payer: BC Managed Care – PPO | Admitting: Physician Assistant

## 2023-02-27 ENCOUNTER — Ambulatory Visit (INDEPENDENT_AMBULATORY_CARE_PROVIDER_SITE_OTHER): Payer: BC Managed Care – PPO

## 2023-02-27 ENCOUNTER — Encounter: Payer: Self-pay | Admitting: Physician Assistant

## 2023-02-27 VITALS — Ht 65.0 in | Wt 224.6 lb

## 2023-02-27 DIAGNOSIS — M25551 Pain in right hip: Secondary | ICD-10-CM

## 2023-02-27 NOTE — Progress Notes (Signed)
Office Visit Note   Patient: Alexandra Hood           Date of Birth: 1971/03/31           MRN: [id number] Visit Date: 02/27/2023              Requested by: Charyl Dancer, NP [place],   [id number] PCP: Charyl Dancer, NP  No chief complaint on file.     HPI: Patient is a pleasant 52 year old woman who is previously seen Dr. Erlinda Hong and Dr. Junius Roads for her hips.  She comes in today complaining of right hip pain.  She said she had a labral tear in the right hip diagnosed a couple years ago.  She was given some intra-articular injection by Dr. Junius Roads and has not had any pain since.  She has a return of her groin pain approximately a few days ago.  She denies any injury.  Assessment & Plan: Visit Diagnoses: Right hip pain  Plan: She does have some early degenerative changes but fairly comparable to her x-rays a few years ago.  She did well with an intra-articular injection and I have referred her to Dr. Rolena Infante for an intra-articular injection of her right hip.  In the meantime I have offered her an injection of Toradol today and she would like to go forward with this.  May follow-up with Dr. Rolena Infante  Follow-Up Instructions: For injection with Dr. Lannette Donath Exam  Patient is alert, oriented, no adenopathy, well-dressed, normal affect, normal respiratory effort. Examination of her right hip she has positive pain in her groin.  She is increased in pain with internal/external rotation and logrolling of her hip.  She has good dorsiflexion plantarflexion extension flexion of her leg strength is intact she is neurovascular intact  Imaging: No results found. No images are attached to the encounter.  Labs: Lab Results  Component Value Date   HGBA1C 6.4 10/20/2022   HGBA1C 6.0 (H) 10/10/2017   HGBA1C 5.8 04/25/2013   ESRSEDRATE 41 (H) 03/25/2020   ESRSEDRATE 66 (H) 03/27/2012   CRP 11 (H) 03/25/2020   REPTSTATUS 05/30/2009 FINAL 05/27/2009    REPTSTATUS 07/12/2009 FINAL 05/27/2009   REPTSTATUS 06/29/2009 FINAL 05/27/2009   GRAMSTAIN  05/27/2009    MODERATE WBC PRESENT,BOTH PMN AND MONONUCLEAR RARE SQUAMOUS EPITHELIAL CELLS PRESENT NO ORGANISMS SEEN   CULT Non-Pathogenic Oropharyngeal-type Flora Isolated. 05/27/2009   CULT NO ACID FAST BACILLI ISOLATED IN 6 WEEKS 05/27/2009   CULT No Fungi Isolated in 4 Weeks 05/27/2009   LABORGA NO GROWTH 01/10/2016     Lab Results  Component Value Date   ALBUMIN 4.6 10/20/2022   ALBUMIN 4.4 03/25/2020   ALBUMIN 4.1 03/12/2020    Lab Results  Component Value Date   MG 1.9 07/26/2014   MG 1.8 04/25/2013   Lab Results  Component Value Date   VD25OH <7.00 (L) 10/20/2022   VD25OH 17 (L) 04/25/2013   VD25OH <10 (L) 03/05/2012    No results found for: "PREALBUMIN"    Latest Ref Rng & Units 10/20/2022   11:06 AM 03/25/2020   10:42 AM 03/12/2020    6:44 PM  CBC EXTENDED  WBC 4.0 - 10.5 K/uL 7.4  7.7  12.9   RBC 3.87 - 5.11 Mil/uL 4.56  4.62  4.52   Hemoglobin 12.0 - 15.0 g/dL 13.1  13.0  12.8   HCT 36.0 - 46.0 % 39.8  40.5  40.1   Platelets 150.0 - 400.0 K/uL  374.0  369  426   NEUT# 1.4 - 7.0 x10E3/uL  4.8  8.6   Lymph# 0.7 - 3.1 x10E3/uL  2.1  3.2      There is no height or weight on file to calculate BMI.  Orders:  No orders of the defined types were placed in this encounter.  No orders of the defined types were placed in this encounter.    Procedures: No procedures performed  Clinical Data: No additional findings.  ROS:  All other systems negative, except as noted in the HPI. Review of Systems  Objective: Vital Signs: LMP 12/25/2006   Specialty Comments:  No specialty comments available.  PMFS History: Patient Active Problem List   Diagnosis Date Noted   Tear of right acetabular labrum 04/24/2020   Tear of left acetabular labrum 04/24/2020   Trochanteric bursitis of right hip 04/20/2020   Hip pain, acute, left 04/20/2020   Leukocytosis 03/25/2020    Dysuria 03/25/2020   Right lower quadrant abdominal tenderness with rebound tenderness 03/25/2020   Right lower quadrant abdominal pain 03/12/2020   Alopecia 01/08/2019   Papilledema of right eye 01/08/2019   Migraine 01/07/2019   Gross hematuria 06/15/2018   Pertussis    Flank pain 10/23/2016   Multiple pulmonary nodules 08/24/2014   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   Sinus tachycardia 10/23/2013   Cardiomyopathy, 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 Katy Fitch - dxed Jan 2009, on diamox  - LP 01/24/2008 at [facility] - Normal ( pressure 14cm, gluc 68, Protein 23, Clear, colorless, RBC 3, WBC - too few to count)  - CT head non contrast done at [facility] 11/25/2007: "normal". Ddoes not report MRI, MRA, MRV being done - 2nd opinon by Dr. Leonie Man - reportdly had MRI/MRV both normal; followup pending   Pulmonary sarcoidosis ([place])    - Arlyce Harman 07/25/2010 on pred 10/mtx 10 -> FEv1 1.84L/775%, FVC 2.25L - SPiro 09/06/2010 -> Fev1 2.14L/81%. Cut pred to '5mg'$  per day. Cont mtx - SPiro 01/20/2011 - Fev1 1.6L/61%, Ratio 74%. CXR Worse -> increased pred to '10mg'$  perday. Cont Mtx.     Ventricular tachycardia ([place])    Vitamin D deficiency    - Aug 2011: 22 and low -> commence Rx. - Jan 2012: 11    Family History  Problem Relation Age of Onset   Allergies Mother    Diabetes Mother    Hypertension Mother    Hyperlipidemia Mother    Migraines Mother     Hyperlipidemia Father    Hypertension Father    Diabetes Father    Stroke Father    Hyperlipidemia Sister    Hypertension Sister    Meniere's disease Sister    Diabetes Sister    COPD Maternal Grandfather    Leukemia Paternal Grandmother    Heart attack Paternal Grandfather    Colon cancer Neg Hx    Esophageal cancer Neg Hx    Rectal cancer Neg Hx  Stomach cancer Neg Hx     Past Surgical History:  Procedure Laterality Date   CARDIAC DEFIBRILLATOR PLACEMENT  march 2012   [employer]   ESOPHAGOGASTRODUODENOSCOPY (EGD) WITH PROPOFOL N/A 02/25/2016   Procedure: ESOPHAGOGASTRODUODENOSCOPY (EGD) WITH PROPOFOL;  Surgeon: Laurence Spates, MD;  Location: [facility];  Service: Endoscopy;  Laterality: N/A;   ICD GENERATOR CHANGEOUT N/A 09/06/2018   Procedure: ICD GENERATOR CHANGEOUT;  Surgeon: Deboraha Sprang, MD;  Location: [facility];  Service: Cardiovascular;  Laterality: N/A;   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

## 2023-03-06 ENCOUNTER — Encounter: Payer: Self-pay | Admitting: Sports Medicine

## 2023-03-06 ENCOUNTER — Ambulatory Visit: Payer: Self-pay

## 2023-03-06 ENCOUNTER — Ambulatory Visit: Payer: BC Managed Care – PPO | Admitting: Sports Medicine

## 2023-03-06 DIAGNOSIS — M25551 Pain in right hip: Secondary | ICD-10-CM | POA: Diagnosis not present

## 2023-03-06 MED ORDER — METHYLPREDNISOLONE ACETATE 40 MG/ML IJ SUSP
80.0000 mg | INTRAMUSCULAR | Status: AC | PRN
  Administered 2023-03-06: 80 mg via INTRA_ARTICULAR

## 2023-03-06 MED ORDER — LIDOCAINE HCL 1 % IJ SOLN
4.0000 mL | INTRAMUSCULAR | Status: AC | PRN
  Administered 2023-03-06: 4 mL

## 2023-03-06 NOTE — Progress Notes (Signed)
   Procedure Note  Patient: Alexandra Hood             Date of Birth: Nov 20, 1971           MRN: [id number]             Visit Date: 03/06/2023  Procedures: Visit Diagnoses:  1. Pain in right hip    Large Joint Inj: R hip joint on 03/06/2023 1:57 PM Indications: pain Details: 22 G 3.5 in needle, ultrasound-guided anterior approach Medications: 4 mL lidocaine 1 %; 80 mg methylPREDNISolone acetate 40 MG/ML Outcome: tolerated well, no immediate complications  Procedure: US-guided intra-articular hip injection, right After discussion on risks/benefits/indications and informed verbal consent was obtained, a timeout was performed. Patient was lying supine on exam table. The hip was cleaned with betadine and alcohol swabs. Then utilizing ultrasound guidance, the patient's femoral head and neck junction was identified and subsequently injected with 4:2 lidocaine:depomedrol via an in-plane approach with ultrasound visualization of the injectate administered into the hip joint. Patient tolerated procedure well without immediate complications.  Procedure, treatment alternatives, risks and benefits explained, specific risks discussed. Consent was given by the patient. Immediately prior to procedure a time out was called to verify the correct patient, procedure, equipment, support staff and site/side marked as required. Patient was prepped and draped in the usual sterile fashion.    - I evaluated the patient about 10 minutes post-injection and he had good improvement in pain and range of motion - follow-up with Alexandra Hood or myself as needed - note provided for work tomorrow  Elba Barman, [place]  This note was dictated using Dragon naturally speaking software and may contain errors in syntax, spelling, or content which have not been identified prior to signing this note.

## 2023-03-11 ENCOUNTER — Other Ambulatory Visit: Payer: Self-pay | Admitting: Nurse Practitioner

## 2023-03-12 ENCOUNTER — Ambulatory Visit: Payer: BC Managed Care – PPO | Admitting: Nurse Practitioner

## 2023-03-12 ENCOUNTER — Encounter: Payer: Self-pay | Admitting: Nurse Practitioner

## 2023-03-12 VITALS — BP 132/84 | HR 89 | Temp 98.4°F | Ht 65.0 in | Wt 221.8 lb

## 2023-03-12 DIAGNOSIS — I429 Cardiomyopathy, unspecified: Secondary | ICD-10-CM | POA: Diagnosis not present

## 2023-03-12 DIAGNOSIS — D860 Sarcoidosis of lung: Secondary | ICD-10-CM

## 2023-03-12 DIAGNOSIS — I10 Essential (primary) hypertension: Secondary | ICD-10-CM | POA: Diagnosis not present

## 2023-03-12 DIAGNOSIS — G932 Benign intracranial hypertension: Secondary | ICD-10-CM

## 2023-03-12 DIAGNOSIS — E559 Vitamin D deficiency, unspecified: Secondary | ICD-10-CM | POA: Diagnosis not present

## 2023-03-12 DIAGNOSIS — R7303 Prediabetes: Secondary | ICD-10-CM | POA: Diagnosis not present

## 2023-03-12 DIAGNOSIS — Z23 Encounter for immunization: Secondary | ICD-10-CM | POA: Diagnosis not present

## 2023-03-12 MED ORDER — AMLODIPINE BESYLATE 5 MG PO TABS: 5.0000 mg | ORAL_TABLET | Freq: Every day | ORAL | 1 refills | Status: DC

## 2023-03-12 NOTE — Assessment & Plan Note (Signed)
She is currently following with Dr. Chase Caller for this. Continue collaborations and recommendations from pulmonology

## 2023-03-12 NOTE — Patient Instructions (Signed)
It was great to see you!  We are checking your labs today and will let you know the results via mychart/phone.   I have refilled your amlodipine.   Let's follow-up in 6 months, sooner if you have concerns.  If a referral was placed today, you will be contacted for an appointment. Please note that routine referrals can sometimes take up to 3-4 weeks to process. Please call our office if you haven't heard anything after this time frame.  Take care,  Vance Peper, NP

## 2023-03-12 NOTE — Telephone Encounter (Signed)
LVM for patient to return call.   Per last MYChart message Alexandra Hood advised patient to schedule an appointment in 3-4 weeks but I see that appointment was made for 07/18/23.

## 2023-03-12 NOTE — Assessment & Plan Note (Signed)
She is currently following with neurology.  They recently switched her from Diamox to Topamax 25 mg daily.

## 2023-03-12 NOTE — Assessment & Plan Note (Signed)
She has a history of vitamin D deficiency, will check levels today.

## 2023-03-12 NOTE — Assessment & Plan Note (Signed)
Chronic, stable.  She is currently following with cardiology and has an ICD in place.  We will check CMP, CBC, lipid panel today 

## 2023-03-12 NOTE — Assessment & Plan Note (Signed)
Chronic, stable.  BP today 132/84.  She is tolerating the amlodipine 5 mg daily and spironolactone 12.5 mg daily without side effects.  Will check CMP, CBC, lipid panel today.  Of note, her neurologist switched her from Diamox to Topamax for pseudotumor cerebri, this may be why her blood pressure went up and was not controlled.

## 2023-03-12 NOTE — Progress Notes (Signed)
Established Patient Office Visit  Subjective   Patient ID: Alexandra Hood, female    DOB: [birth year]  Age: 52 y.o. MRN: [id number]  Chief Complaint  Patient presents with   Hypertension    No concerns   HPI:  Alexandra Hood is here to follow-up on hypertension. Last visit she was started on amlodipine 5mg  daily and her cardiologist started her on spironolactone 12.5mg  daily. She states that her blood pressure has gone back to normal range since starting these medications. Of note, her neurologist recently changed her from diamox to topamax for her pseudotumor cerebri. She denies chest pain, shortness of breath, and headaches.     ROS See pertinent positives and negatives per HPI.    Objective:     BP 132/84 (BP Location: Right Arm)   Pulse 89   Temp 98.4 F (36.9 C)   Ht 5\' 5"  (1.651 m)   Wt 221 lb 12.8 oz (100.6 kg)   LMP 12/25/2006   SpO2 98%   BMI 36.91 kg/m    Physical Exam Vitals and nursing note reviewed.  Constitutional:      General: She is not in acute distress.    Appearance: Normal appearance.  HENT:     Head: Normocephalic.  Eyes:     Conjunctiva/sclera: Conjunctivae normal.  Cardiovascular:     Rate and Rhythm: Normal rate and regular rhythm.     Pulses: Normal pulses.     Heart sounds: Normal heart sounds.  Pulmonary:     Effort: Pulmonary effort is normal.     Breath sounds: Normal breath sounds.  Musculoskeletal:     Cervical back: Normal range of motion.  Skin:    General: Skin is warm.  Neurological:     General: No focal deficit present.     Mental Status: She is alert and oriented to person, place, and time.  Psychiatric:        Mood and Affect: Mood normal.        Behavior: Behavior normal.        Thought Content: Thought content normal.        Judgment: Judgment normal.    The 10-year ASCVD risk score (Arnett DK, et al., 2019) is: 5.6%    Assessment & Plan:   Problem List Items Addressed This Visit        Cardiovascular and Mediastinum   Cardiomyopathy, secondary-sarcoid    Chronic, stable.  She is currently following with cardiology and has an ICD in place.  We will check CMP, CBC, lipid panel today      Relevant Medications   amLODipine (NORVASC) 5 MG tablet   Primary hypertension - Primary    Chronic, stable.  BP today 132/84.  She is tolerating the amlodipine 5 mg daily and spironolactone 12.5 mg daily without side effects.  Will check CMP, CBC, lipid panel today.  Of note, her neurologist switched her from Diamox to Topamax for pseudotumor cerebri, this may be why her blood pressure went up and was not controlled.      Relevant Medications   amLODipine (NORVASC) 5 MG tablet   Other Relevant Orders   CBC   Lipid panel     Respiratory   Pulmonary sarcoidosis ([place])    She is currently following with Dr. Chase Caller for this. Continue collaborations and recommendations from pulmonology         Nervous and [place]    She is currently following with neurology.  They recently switched  her from Diamox to Topamax 25 mg daily.        Other   Vitamin D deficiency    She has a history of vitamin D deficiency, will check levels today.      Relevant Orders   VITAMIN D 25 Hydroxy (Vit-D Deficiency, Fractures)   Pre-diabetes    Last A1c was 6.4%, will recheck today.      Relevant Orders   CBC   Comprehensive metabolic panel   Hemoglobin A1c   Other Visit Diagnoses     Immunization due       TD booster given today   Relevant Orders   Td vaccine greater than or equal to 7yo preservative free IM       Return in about 6 months (around 09/12/2023) for CPE.    Charyl Dancer, NP

## 2023-03-12 NOTE — Telephone Encounter (Signed)
Patient returned call and got her scheduled to come in the office for an appointment with Lauren today 03/12/23 at 3pm.

## 2023-03-12 NOTE — Assessment & Plan Note (Signed)
Last A1c was 6.4%, will recheck today.

## 2023-03-13 ENCOUNTER — Encounter: Payer: Self-pay | Admitting: Nurse Practitioner

## 2023-03-13 LAB — COMPREHENSIVE METABOLIC PANEL
ALT: 14 U/L (ref 0–35)
AST: 11 U/L (ref 0–37)
Albumin: 4.2 g/dL (ref 3.5–5.2)
Alkaline Phosphatase: 94 U/L (ref 39–117)
BUN: 14 mg/dL (ref 6–23)
CO2: 27 mEq/L (ref 19–32)
Calcium: 9.8 mg/dL (ref 8.4–10.5)
Chloride: 101 mEq/L (ref 96–112)
Creatinine, Ser: 0.81 mg/dL (ref 0.40–1.20)
GFR: 85.06 mL/min (ref >60.00)
Glucose, Bld: 97 mg/dL (ref 70–99)
Potassium: 4.5 mEq/L (ref 3.5–5.1)
Sodium: 139 mEq/L (ref 135–145)
Total Bilirubin: 0.2 mg/dL (ref 0.2–1.2)
Total Protein: 7.1 g/dL (ref 6.0–8.3)

## 2023-03-13 LAB — LIPID PANEL
Cholesterol: 227 mg/dL — ABNORMAL HIGH (ref 0–200)
HDL: 50.6 mg/dL (ref >39.00)
NonHDL: 175.99
Total CHOL/HDL Ratio: 4
Triglycerides: 216 mg/dL — ABNORMAL HIGH (ref 0.0–149.0)
VLDL: 43.2 mg/dL — ABNORMAL HIGH (ref 0.0–40.0)

## 2023-03-13 LAB — CBC
HCT: 37.8 % (ref 36.0–46.0)
Hemoglobin: 12.4 g/dL (ref 12.0–15.0)
MCHC: 32.8 g/dL (ref 30.0–36.0)
MCV: 86.3 fl (ref 78.0–100.0)
Platelets: 437 10*3/uL — ABNORMAL HIGH (ref 150.0–400.0)
RBC: 4.38 Mil/uL (ref 3.87–5.11)
RDW: 14.3 % (ref 11.5–15.5)
WBC: 14.1 10*3/uL — ABNORMAL HIGH (ref 4.0–10.5)

## 2023-03-13 LAB — LDL CHOLESTEROL, DIRECT: Direct LDL: 146 mg/dL

## 2023-03-13 LAB — HEMOGLOBIN A1C: Hgb A1c MFr Bld: 6.6 % — ABNORMAL HIGH (ref 4.6–6.5)

## 2023-03-13 LAB — VITAMIN D 25 HYDROXY (VIT D DEFICIENCY, FRACTURES): VITD: 16.03 ng/mL — ABNORMAL LOW (ref 30.00–100.00)

## 2023-03-13 MED ORDER — VITAMIN D (ERGOCALCIFEROL) 1.25 MG (50000 UNIT) PO CAPS: 50000.0000 [IU] | ORAL_CAPSULE | ORAL | 1 refills | Status: DC

## 2023-03-13 NOTE — Addendum Note (Signed)
Addended by: Vance Peper A on: 03/13/2023 03:04 PM   Modules accepted: Orders

## 2023-03-14 MED ORDER — ROSUVASTATIN CALCIUM 10 MG PO TABS: 10.0000 mg | ORAL_TABLET | Freq: Every day | ORAL | 2 refills | Status: DC

## 2023-03-14 NOTE — Addendum Note (Signed)
Addended by: Vance Peper A on: 03/14/2023 12:25 PM   Modules accepted: Orders

## 2023-03-22 ENCOUNTER — Ambulatory Visit (INDEPENDENT_AMBULATORY_CARE_PROVIDER_SITE_OTHER): Payer: BC Managed Care – PPO

## 2023-03-22 DIAGNOSIS — I429 Cardiomyopathy, unspecified: Secondary | ICD-10-CM | POA: Diagnosis not present

## 2023-03-22 LAB — CUP PACEART REMOTE DEVICE CHECK
Battery Remaining Longevity: 48 mo
Battery Voltage: 2.92 V
Brady Statistic AP VP Percent: 0 %
Brady Statistic AP VS Percent: 0.01 %
Brady Statistic AS VP Percent: 0.03 %
Brady Statistic AS VS Percent: 99.95 %
Brady Statistic RA Percent Paced: 0.01 %
Brady Statistic RV Percent Paced: 0.04 %
Date Time Interrogation Session: 20240328044222
HighPow Impedance: 47 Ohm
HighPow Impedance: 59 Ohm
Implantable Lead Connection Status: 753985
Implantable Lead Connection Status: 753985
Implantable Lead Implant Date: 20120308
Implantable Lead Implant Date: 20120308
Implantable Lead Location: 753859
Implantable Lead Location: 753860
Implantable Lead Model: 5076
Implantable Lead Model: 6947
Implantable Pulse Generator Implant Date: 20190913
Lead Channel Impedance Value: 285 Ohm
Lead Channel Impedance Value: 380 Ohm
Lead Channel Impedance Value: 437 Ohm
Lead Channel Pacing Threshold Amplitude: 0.375 V
Lead Channel Pacing Threshold Amplitude: 0.75 V
Lead Channel Pacing Threshold Pulse Width: 0.4 ms
Lead Channel Pacing Threshold Pulse Width: 0.4 ms
Lead Channel Sensing Intrinsic Amplitude: 3.375 mV
Lead Channel Sensing Intrinsic Amplitude: 3.375 mV
Lead Channel Sensing Intrinsic Amplitude: 3.625 mV
Lead Channel Sensing Intrinsic Amplitude: 3.625 mV
Lead Channel Setting Pacing Amplitude: 1.5 V
Lead Channel Setting Pacing Amplitude: 2 V
Lead Channel Setting Pacing Pulse Width: 0.4 ms
Lead Channel Setting Sensing Sensitivity: 0.45 mV
Zone Setting Status: 755011
Zone Setting Status: 755011

## 2023-04-26 NOTE — Progress Notes (Signed)
Remote ICD transmission.   

## 2023-05-01 ENCOUNTER — Encounter (HOSPITAL_COMMUNITY): Payer: Self-pay

## 2023-05-01 ENCOUNTER — Ambulatory Visit (HOSPITAL_COMMUNITY)
Admission: RE | Admit: 2023-05-01 | Discharge: 2023-05-01 | Disposition: A | Discharge status: Home / Self Care | Payer: BC Managed Care – PPO | Source: Ambulatory Visit | Attending: Family Medicine | Admitting: Family Medicine

## 2023-05-01 VITALS — BP 137/73 | HR 94 | Temp 98.3°F | Resp 18

## 2023-05-01 DIAGNOSIS — N10 Acute tubulo-interstitial nephritis: Secondary | ICD-10-CM | POA: Insufficient documentation

## 2023-05-01 LAB — POCT URINALYSIS DIP (MANUAL ENTRY)
Bilirubin, UA: NEGATIVE
Glucose, UA: 100 mg/dL — AB
Ketones, POC UA: NEGATIVE mg/dL
Leukocytes, UA: NEGATIVE
Nitrite, UA: POSITIVE — AB
Protein Ur, POC: NEGATIVE mg/dL
Spec Grav, UA: 1.02 (ref 1.010–1.025)
Urobilinogen, UA: 1 E.U./dL
pH, UA: 5.5 (ref 5.0–8.0)

## 2023-05-01 MED ORDER — KETOROLAC TROMETHAMINE 30 MG/ML IJ SOLN
30.0000 mg | Freq: Once | INTRAMUSCULAR | Status: AC
  Administered 2023-05-01: 30 mg via INTRAMUSCULAR

## 2023-05-01 MED ORDER — KETOROLAC TROMETHAMINE 30 MG/ML IJ SOLN
INTRAMUSCULAR | Status: AC
  Filled 2023-05-01: qty 1

## 2023-05-01 MED ORDER — IBUPROFEN 800 MG PO TABS: 800.0000 mg | ORAL_TABLET | Freq: Three times a day (TID) | ORAL | 0 refills | Status: AC | PRN

## 2023-05-01 MED ORDER — CIPROFLOXACIN HCL 500 MG PO TABS: 500.0000 mg | ORAL_TABLET | Freq: Two times a day (BID) | ORAL | 0 refills | Status: AC

## 2023-05-01 NOTE — ED Provider Notes (Signed)
[facility]    CSN: [id number] Arrival date & time: 05/01/23  8657      History   Chief Complaint Chief Complaint  Patient presents with   Back Pain    UTI or kidney infection - Entered by patient   Abdominal Pain   Urinary Urgency    HPI Alexandra Hood is a 52 y.o. female.    Back Pain Associated symptoms: abdominal pain   Abdominal Pain  Here for flank pain and now right-sided abdominal pain that began yesterday.  She has had no fever but she has had some chills.  No nausea or vomiting.  She has tried AZO and Tylenol, neither of which have worked  She is having some urinary frequency, incomplete bladder emptying and urinary urge.  No dysuria or hematuria  She has had an bilateral oophorectomy but still has her uterus.  Past Medical History:  Diagnosis Date   Headache    Lupus pernio    -skin bx confirmed at [facility] 05/07/2009 (personally reviewed) -looks improved 07/09/2009 -   Pertussis    Pseudotumor cerebri    .Marland KitchenMarland KitchenDr Hyacinth Meeker and Dr. Dione Booze - dxed Jan 2009, on diamox  - LP 01/24/2008 at [facility] - Normal ( pressure 14cm, gluc 68, Protein 23, Clear, colorless, RBC 3, WBC - too few to count)  - CT head non contrast done at [facility] 11/25/2007: "normal". Ddoes not report MRI, MRA, MRV being done - 2nd opinon by Dr. Pearlean Brownie - reportdly had MRI/MRV both normal; followup pending   Pulmonary sarcoidosis (HCC)    - Cleda Daub 07/25/2010 on pred 10/mtx 10 -> FEv1 1.84L/775%, FVC 2.25L - SPiro 09/06/2010 -> Fev1 2.14L/81%. Cut pred to 5mg  per day. Cont mtx - SPiro 01/20/2011 - Fev1 1.6L/61%, Ratio 74%. CXR Worse -> increased pred to 10mg  perday. Cont Mtx.     Ventricular tachycardia (HCC)    Vitamin D deficiency    - Aug 2011: 22 and low -> commence Rx. - Jan 2012: 11    Patient Active Problem List   Diagnosis Date Noted   Primary hypertension 03/12/2023   Tear of right acetabular labrum 04/24/2020   Tear of left acetabular labrum 04/24/2020    Trochanteric bursitis of right hip 04/20/2020   Hip pain, acute, left 04/20/2020   Leukocytosis 03/25/2020   Dysuria 03/25/2020   Right lower quadrant abdominal tenderness with rebound tenderness 03/25/2020   Right lower quadrant abdominal pain 03/12/2020   Alopecia 01/08/2019   Papilledema of right eye 01/08/2019   Migraine 01/07/2019   Gross hematuria 06/15/2018   Flank pain 10/23/2016   Multiple pulmonary nodules 08/24/2014   Pulmonary sarcoidosis (HCC) 08/24/2014   Sinus tachycardia 10/23/2013   Cardiomyopathy, 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   Thrombocytosis 03/06/2012   Obesity 03/06/2012   Pre-diabetes 03/06/2012   Lupus pernio 05/18/2011   Keloid--ICD incision 04/25/2011   Automatic implantable cardioverter-defibrillator in situ 04/18/2011   DEPRESSION 02/15/2011   Vitamin D deficiency 01/20/2011   VENTRICULAR TACHYCARDIA 01/20/2011   PSEUDOTUMOR CEREBRI 05/12/2009    Past Surgical History:  Procedure Laterality Date   CARDIAC DEFIBRILLATOR PLACEMENT  march 2012   [employer]   ESOPHAGOGASTRODUODENOSCOPY (EGD) WITH PROPOFOL N/A 02/25/2016   Procedure: ESOPHAGOGASTRODUODENOSCOPY (EGD) WITH PROPOFOL;  Surgeon: Carman Ching, MD;  Location: [facility];  Service: Endoscopy;  Laterality: N/A;   ICD GENERATOR CHANGEOUT N/A 09/06/2018   Procedure: ICD GENERATOR CHANGEOUT;  Surgeon: Duke Salvia, MD;  Location: [facility] INVASIVE CV LAB;  Service: Cardiovascular;  Laterality: N/A;  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      Home Medications    Prior to Admission medications   Medication Sig Start Date End Date Taking? Authorizing Provider  ciprofloxacin (CIPRO) 500 MG tablet Take 1 tablet (500 mg total) by mouth 2 (two) times daily for 7 days. 05/01/23 05/08/23 Yes Zenia Resides, MD  ibuprofen (ADVIL) 800 MG tablet Take 1 tablet (800 mg total) by mouth every 8 (eight) hours as needed (pain). 05/01/23  Yes Zenia Resides, MD   albuterol (VENTOLIN HFA) 108 (90 Base) MCG/ACT inhaler Inhale 2 puffs into the lungs every 6 (six) hours as needed for wheezing or shortness of breath. 04/06/20   Royal Hawthorn, NP  amLODipine (NORVASC) 5 MG tablet Take 1 tablet (5 mg total) by mouth daily. 03/12/23   McElwee, Jake Church, NP  Galcanezumab-gnlm (EMGALITY) 120 MG/ML SOSY INJECT 120MG  UNDER THE SKIN EVERY 30 DAYS 10/06/22   Drema Dallas, DO  Multiple Vitamin (MULTIVITAMIN WITH MINERALS) TABS tablet Take 1 tablet by mouth daily.    [provider]  Rimegepant Sulfate (NURTEC) 75 MG TBDP Take 75 mg by mouth daily as needed. 10/06/22   Everlena Cooper, Adam R, DO  rosuvastatin (CRESTOR) 10 MG tablet Take 1 tablet (10 mg total) by mouth daily. 03/14/23   McElwee, Lauren A, NP  spironolactone (ALDACTONE) 25 MG tablet Take 0.5 tablets (12.5 mg total) by mouth daily. 02/08/23   Duke Salvia, MD  Tiotropium Bromide Monohydrate (SPIRIVA RESPIMAT) 1.25 MCG/ACT AERS Inhale 2 puffs into the lungs daily. 02/09/23   Kalman Shan, MD  topiramate (TOPAMAX) 25 MG tablet Take 1 tablet (25 mg total) by mouth at bedtime. 10/06/22   Drema Dallas, DO  Vitamin D, Ergocalciferol, (DRISDOL) 1.25 MG (50000 UNIT) CAPS capsule Take 1 capsule (50,000 Units total) by mouth every 7 (seven) days. 03/13/23   McElwee, Jake Church, NP    Family History Family History  Problem Relation Age of Onset   Allergies Mother    Diabetes Mother    Hypertension Mother    Hyperlipidemia Mother    Migraines Mother    Hyperlipidemia Father    Hypertension Father    Diabetes Father    Stroke Father    Hyperlipidemia Sister    Hypertension Sister    Meniere's disease Sister    Diabetes Sister    COPD Maternal Grandfather    Leukemia Paternal Grandmother    Heart attack Paternal Grandfather    Colon cancer Neg Hx    Esophageal cancer Neg Hx    Rectal cancer Neg Hx    Stomach cancer Neg Hx     Social History Social History   Tobacco Use   Smoking status:  Former    Packs/day: 1.00    Years: 15.00    Additional pack years: 0.00    Total pack years: 15.00    Types: Cigarettes    Quit date: 03/03/2007    Years since quitting: 16.1   Smokeless tobacco: Never  Vaping Use   Vaping Use: Never used  Substance Use Topics   Alcohol use: No    Alcohol/week: 0.0 standard drinks of alcohol   Drug use: No     Allergies   Patient has no known allergies.   Review of Systems Review of Systems  Gastrointestinal:  Positive for abdominal pain.  Musculoskeletal:  Positive for back pain.  Physical Exam Triage Vital Signs ED Triage Vitals  Enc Vitals Group     BP 05/01/23 0937 137/73     Pulse Rate 05/01/23 0937 94     Resp 05/01/23 0937 18     Temp 05/01/23 0937 98.3 F (36.8 C)     Temp Source 05/01/23 0937 Oral     SpO2 05/01/23 0937 95 %     Weight --      Height --      Head Circumference --      Peak Flow --      Pain Score 05/01/23 0935 9     Pain Loc --      Pain Edu? --      Excl. in GC? --    No data found.  Updated Vital Signs BP 137/73 (BP Location: Left Arm)   Pulse 94   Temp 98.3 F (36.8 C) (Oral)   Resp 18   LMP 12/25/2006   SpO2 95%   Visual Acuity Right Eye Distance:   Left Eye Distance:   Bilateral Distance:    Right Eye Near:   Left Eye Near:    Bilateral Near:     Physical Exam Vitals reviewed.  Constitutional:      General: She is not in acute distress.    Appearance: She is not toxic-appearing.  HENT:     Mouth/Throat:     Mouth: Mucous membranes are moist.     Pharynx: No oropharyngeal exudate or posterior oropharyngeal erythema.  Eyes:     Extraocular Movements: Extraocular movements intact.     Conjunctiva/sclera: Conjunctivae normal.     Pupils: Pupils are equal, round, and reactive to light.  Cardiovascular:     Rate and Rhythm: Normal rate and regular rhythm.     Heart sounds: No murmur heard. Pulmonary:     Effort: Pulmonary effort is normal. No respiratory distress.      Breath sounds: No wheezing, rhonchi or rales.  Chest:     Chest wall: No tenderness.  Abdominal:     General: There is no distension.     Palpations: Abdomen is soft.     Tenderness: There is abdominal tenderness (right periumbilical and right upper quadrant). There is right CVA tenderness and left CVA tenderness (though less than on right).  Musculoskeletal:     Cervical back: Neck supple.  Lymphadenopathy:     Cervical: No cervical adenopathy.  Skin:    Capillary Refill: Capillary refill takes less than 2 seconds.     Coloration: Skin is not jaundiced or pale.  Neurological:     General: No focal deficit present.     Mental Status: She is alert and oriented to person, place, and time.  Psychiatric:        Behavior: Behavior normal.      UC Treatments / Results  Labs (all labs ordered are listed, but only abnormal results are displayed) Labs Reviewed  POCT URINALYSIS DIP (MANUAL ENTRY) - Abnormal; Notable for the following components:      Result Value   Color, UA orange (*)    Glucose, UA =100 (*)    Blood, UA trace-intact (*)    Nitrite, UA Positive (*)    All other components within normal limits  URINE CULTURE    EKG   Radiology No results found.  Procedures Procedures (including critical care time)  Medications Ordered in UC Medications  ketorolac (TORADOL) 30 MG/ML injection 30 mg (has no administration in time range)  Initial Impression / Assessment and Plan / UC Course  I have reviewed the triage vital signs and the nursing notes.  Pertinent labs & imaging results that were available during my care of the patient were reviewed by me and considered in my medical decision making (see chart for details).       Last EGFR in March was 85   UA shows nitrites, trace of blood and 100 mg percent of glucose.  Urine culture is sent  .  Cipro was sent in to treat the UTI, possible Pyelo  Toradol is given here for pain and ibuprofen is sent in for the  pain for home Final Clinical Impressions(s) / UC Diagnoses   Final diagnoses:  Acute pyelonephritis     Discharge Instructions      The urinalysis showed some nitrites, trace of blood.  This can be a sign of a urinary tract infection  Take Cipro 500 mg--1 tablet 2 times daily for 7 days  Urine culture is sent, and staff will notify you if anything needs to be changed on the antibiotic choice  You have been given a shot of Toradol 30 mg today.  Take ibuprofen 800 mg--1 tab every 8 hours as needed for pain.  Make sure you are drinking plenty of fluids  If your pain intensifies instead of improving or if you begin to vomit, please proceed to the emergency room for further evaluation     ED Prescriptions     Medication Sig Dispense Auth. Provider   ciprofloxacin (CIPRO) 500 MG tablet Take 1 tablet (500 mg total) by mouth 2 (two) times daily for 7 days. 14 tablet Syrianna Schillaci, Janace Aris, MD   ibuprofen (ADVIL) 800 MG tablet Take 1 tablet (800 mg total) by mouth every 8 (eight) hours as needed (pain). 21 tablet Cheryel Kyte, Janace Aris, MD      PDMP not reviewed this encounter.   Zenia Resides, MD 05/01/23 [phone]

## 2023-05-01 NOTE — Discharge Instructions (Signed)
The urinalysis showed some nitrites, trace of blood.  This can be a sign of a urinary tract infection  Take Cipro 500 mg--1 tablet 2 times daily for 7 days  Urine culture is sent, and staff will notify you if anything needs to be changed on the antibiotic choice  You have been given a shot of Toradol 30 mg today.  Take ibuprofen 800 mg--1 tab every 8 hours as needed for pain.  Make sure you are drinking plenty of fluids  If your pain intensifies instead of improving or if you begin to vomit, please proceed to the emergency room for further evaluation

## 2023-05-01 NOTE — ED Triage Notes (Signed)
Pt reports urinary urgency, lower back pain and lower abdominal pain. States tried Tylenol and AZO with no relief. States has a hx of UTIs and kidney infections.

## 2023-05-02 LAB — URINE CULTURE: Culture: 80000 — AB

## 2023-05-08 ENCOUNTER — Other Ambulatory Visit: Payer: Self-pay | Admitting: Nurse Practitioner

## 2023-05-13 NOTE — Progress Notes (Unsigned)
OV 07/06/2014  Chief Complaint  Patient presents with   Follow-up    Pt states her breathing has slightly improved since last OV, pt is able to do more activities. Pt c/o RUQ pain with inspiration. Denies cough and CP/tightness. PT states she has DVT in left lower leg that pt stated she noticed last week.    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 I cut the Medrol to 2 mg once daily. Then on 05/22/2014 she went to the emergency room with abdominal pain right upper quadrant. Apparently this time she did have some fever low grade along with some yellow sputum, right pleuritic chest pain and shortness of breath. CT scan of the abdomen did show some right lower lobe pulmonary infiltrates and she was given Z-Pak. After these all the symptoms resolved except for the right pleuritic chest pain which has largely improved although still persistent and is mild. She did followup with my nurse practitioner 05/28/2014 and had a chest x-ray that shows prominence in the right lower lobe infiltrates suggesting the presence of pneumonia. She had ACEl checked which was 80 and elevated. She's been asked to take Medrol at 4 mg once daily which is doing currently . Overall she is well . Spirometry today is baseline with an FVC of 2.3 the/78%. FEV1 of 1.88 L or 74% and a ratio of 80/96%.   OF note a few days ago she did notice that both calves swelled up and then spontaneously settled. She thinks she had a "blood clot" was no worsening dyspnea or cough.    #sarcoidosis  - continue medrol at 4mg  per day  #Pneumoia Right lower lobe 05/22/14  - glad you are better but we need to keep an eye on the right sided pain  - if pain gets worse call us  - Do CT chest wo contrast mid-end Aug 2015  #Followup -  - Do CT chest wo contrast mid-end Aug 2015 - return to see me or my NP after CT   OV 08/17/2014  Chief Complaint  Patient  presents with   Follow-up    review CT results. Pt denies any increase in SOB, cough, chest tightness.    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 But she had "pneumonia" and some chest pain in spring/summer. So we decided to get CT chest aug 2015. CT shows improvement in UL sarcoid and down to a scar compared to 2010 but lower lobe she has worsening nodular disease. She herself is asymptomatic and feeling well and more keen on tapering off steroids. Denies new problems. 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  However, there are multiple peribronchovascular areas of ground glass and nodularity, in a perilymphatic distribution, new from 2010 but seen on CT AP 05/22/14 (lung bases).  Looks to me like sarcoid progression.  Atypical infection is considered less likely.   OV 01/22/2015 Chief Complaint  Patient presents with   Follow-up    Pt stated her breathing has improved since last OV. Pt stated her SOB has improved and she does not become as SOB as before. Pt denies cough and CP/tightness.    52 year old female with  pulmonary sarcoidosis stage II with ACD placement  I'm known her for several years. She used to be on methotrexate. 3 years ago she had normal function and on high-dose of steroids. Last time I saw her was in August 2015 at which time she was asymptomatic and was expressing a strong desire for coming off steroids. However CT scan of the chest at that time showed that she had new worsening sarcoidosis in terms of nodules in the lower lobes even though the upper lobe sarcoidosis: Original problem was improved to just a scar tissue. Currently she tells me she feels the best ever while she is being maintained on Medrol 4 mg per day [she does not like  prednisone]. She's asymptomatic. She had a CT scan of the chest in December 2015 that shows stability compared to August 2015 but is definitely worse compared to 2010. We did a spirometry today and it shows continued decline of lung function with an FEV1 of 1.6 L/66%. She is very surprised by this the technique was good and she is asymptomatic. She is really reluctant to go up on the steroids add methotrexate again  OV 11/12/2015  Chief Complaint  Patient presents with   Follow-up    Pt states she has had a dry cough recently, pt thinks this is due to the smokey air. Pt denies SOB and CP/tightness.    Follow-up pulmonary sarcoidosis stage II with AICD placement  Last seen January 2016. She is now maintained on Medrol 4 mg daily. In January 2016 FEV1 was 1.6 L/66%. She was asymptomatic at that time. Even at that time she was off methotrexate. There was concern for worsening sarcoidosis but after discussion we adopted a weight and watch approach. She currently continues Medrol 4 mg per day. She tells me she continues to be asymptomatic. Spirometry today shows FEV1 1.7 L/67%. There is similar to January 2016. She is a little bit worried about the obstruction in lung function. She is open to re-challenging herself with methotrexate though she does not want to go up on steroids. She wants have a flu shot and pneumonia shot today she continues to work at [employer] in [place]    OV 01/16/2023  Subjective:  Patient ID: Myrtie Cruise Dumas-Hightower, female , DOB: 1971/06/03 , age 52 y.o. , MRN: [id number] , ADDRESS: [address] [place] [address] PCP Gerre Scull, NP Patient Care Team: Gerre Scull, NP as PCP - General (Internal Medicine) Duke Salvia, MD as Consulting Physician (Cardiology) Drema Dallas, DO as Consulting Physician (Neurology)  This Provider for this visit: Treatment Team:  Attending Provider: Kalman Shan, MD    01/16/2023 -   Chief  Complaint  Patient presents with   Consult    Pt is here to reestablish care for pulmonary sarcoidosis. Last seen in 2016. Pt states she was doing well until she got covid in September. She states she has more episodes of SOB now. Pt did have albuterol as needed but is out. She states it did help some.      HPI Mariachristina Dumas-Hightower 52 y.o. -has a history of pulmonary sarcoidosis.  I personally have not seen her in over 6 years and therefore is a new consult.  Room was seeing her and all clinically had on methotrexate and prednisone.  At that point sarcoid stabilized and she came off methotrexate and prednisone and then she was on observation.  She also had status post defibrillator placed in 2012.  She tells  me in the last 5-6 issues been doing well.  Stable.  Did not feel her sarcoid in the skin or in the lungs were active.  She had a first bout of COVID-19 in September 2023.  Was mild outpatient COVID.  Did not require oxygen.  But then after that she started noticing when she walked her dog she would get short of breath and stop.  This continues to this day.  It is mild to moderate in intensity relieved by exertion.  Not getting worse not getting better.  No leg edema no hemoptysis no chest pain no cough no wheezing no orthopnea no paroxysmal nocturnal dyspnea.  She continues to work in [employer].  She plans to retire in a few years.  Of note she was a previous smoker.  She has seen cardiology was recommended repeat echo and a follow-up pending.  She had a high-resolution CT scan of the chest: She just has old scar tissue that I personally visualized and agree with the findings.  Her pulmonary function test DLCO stable although FVC is declined around 2% a year.  She has some mild emphysema as well.  She suffers from obesity and the weight is stable.  She does not have any interim diabetes.     2016: weight 226# 01/16/2023 - weig 223.8# CT Chest data - HRCT 12/29/22  Narrative &  Impression  CLINICAL DATA:  Sarcoid.   EXAM: CT CHEST WITHOUT CONTRAST   TECHNIQUE: Multidetector CT imaging of the chest was performed following the standard protocol without intravenous contrast. High resolution imaging of the lungs, as well as inspiratory and expiratory imaging, was performed.   RADIATION DOSE REDUCTION: This exam was performed according to the departmental dose-optimization program which includes automated exposure control, adjustment of the mA and/or kV according to patient size and/or use of iterative reconstruction technique.   COMPARISON:  11/25/2018.   FINDINGS: Cardiovascular: Left anterior descending coronary artery calcification. Heart is at the upper limits of normal in size. No pericardial effusion.   Mediastinum/Nodes: No pathologically enlarged mediastinal or axillary lymph nodes. Hilar regions are difficult to definitively evaluate without IV contrast. Esophagus is grossly unremarkable.   Lungs/Pleura: Centrilobular and paraseptal emphysema. Minimal scattered peribronchovascular nodularity and ground-glass, as before. Scarring in the upper lobes. No pleural fluid. Minimal adherent debris in the airway.   Upper Abdomen: Liver is somewhat heterogeneous in attenuation. Visualized portions of the liver, gallbladder, adrenal glands, kidneys, spleen, pancreas, stomach and bowel are otherwise unremarkable. Upper abdominal lymph nodes are not enlarged by CT size criteria.   Musculoskeletal: Degenerative changes in the spine. No worrisome lytic or sclerotic lesions.   IMPRESSION: 1. Minimal scattered peribronchovascular nodularity and ground-glass, similar and compatible with the provided history of sarcoid. 2. Left anterior descending coronary artery calcification. 3. Liver appears steatotic. 4.  Emphysema (ICD10-J43.9).   1.     Electronically Signed   By: Leanna Battles M.D.   On: 01/01/2023 11:51    No results found.   OV  05/14/2023  Subjective:  Patient ID: Myrtie Cruise Dumas-Hightower, female , DOB: [birth year] , age 39 y.o. , MRN: [id number] , ADDRESS: [address] [place] [address] PCP Gerre Scull, NP Patient Care Team: Gerre Scull, NP as PCP - General (Internal Medicine) Duke Salvia, MD as Consulting Physician (Cardiology) Drema Dallas, DO as Consulting Physician (Neurology)  This Provider for this visit: Treatment Team:  Attending Provider: Kalman Shan, MD    05/14/2023 -  Chief Complaint  Patient presents with   Follow-up    F/up, no concerns     HPI Rakesha Dumas-Hightower 52 y.o. 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 Graciela Husbands # History of COVID-19 in September 2023 and subsequent dyspnea #Obesity with severe migraine multiple antimigraine medications   Returns for follow-up.  Last visit was started empiric Spiriva.  She states it does help.  Simultaneously Dr. Hurman Horn also gave her Aldactone.  She thinks both of the medicines helped and her shortness of breath is much better she feels reassured.  She believes it is in her best interest to continue Spiriva.  We did excess hypoxemia testing was normal without any desaturation.  Other issues: She did have a ER visit for pyelonephritis.  She been diagnosed with hypertension hyperlipidemia.  Simple office walk 224 (66+46 x 2) feet Pod A at [facility] x  3 laps goal with forehead probe 05/14/2023    O2 used ra   Number laps completed Sit stand x 15   Comments about pace god   Resting Pulse Ox/HR 96% and 80/min   Final Pulse Ox/HR 94% and 107/min   Desaturated </= 88% no   Desaturated <= 3% points no   Got Tachycardic >/= 90/min yes   Symptoms at end of test 2 of 10 dyspnea    Miscellaneous comments x     ECHO 01/18/23   IMPRESSIONS     1. Left  ventricular ejection fraction, by estimation, is 55 to 60%. Left  ventricular ejection fraction by 3D volume is 55 %. The left ventricle has  normal function. The left ventricle has no regional wall motion  abnormalities. Left ventricular diastolic   parameters were normal. The average left ventricular global longitudinal  strain is -23.7 %. The global longitudinal strain is normal.   2. Right ventricular systolic function is normal. The right ventricular  size is normal. Tricuspid regurgitation signal is inadequate for assessing  PA pressure.   3. The mitral valve is normal in structure. Trivial mitral valve  regurgitation. No evidence of mitral stenosis.   4. The aortic valve is normal in structure. Aortic valve regurgitation is  not visualized. No aortic stenosis is present.   5. The inferior vena cava is normal in size with greater than 50%  respiratory variability, suggesting right atrial pressure of 3 mmHg.    PFT     Latest Ref Rng & Units 01/12/2023   11:02 AM 03/24/2015    1:50 PM  PFT Results  FVC-Pre L 2.02    FVC-Predicted Pre % 55  76   FVC-Post L 2.12  2.41   FVC-Predicted Post % 58  76   Pre FEV1/FVC % % 88  86   Post FEV1/FCV % % 90  87   FEV1-Pre L 1.79  2.07   FEV1-Predicted Pre % 62  79   FEV1-Post L 1.90  2.11   DLCO uncorrected ml/min/mmHg 20.58  17.92   DLCO UNC% % 95  69   DLCO corrected ml/min/mmHg 20.58    DLCO COR %Predicted % 95    DLVA Predicted % 139  110   TLC L 3.82  3.15   TLC % Predicted % 74  60   RV % Predicted % 84  49        has a past medical history of  Headache, Lupus pernio, Pertussis, Pseudotumor cerebri, Pulmonary sarcoidosis (HCC), Ventricular tachycardia (HCC), and Vitamin D deficiency.   reports that she quit smoking about 16 years ago. Her smoking use included cigarettes. She has a 15.00 pack-year smoking history. She has never used smokeless tobacco.  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    No Known Allergies  Immunization History  Administered Date(s) Administered   Influenza Split 09/24/2014   Influenza Whole 09/29/2009, 09/26/2010, 09/25/2011   Influenza,inj,Quad PF,6+ Mos 10/14/2013, 11/12/2015, 09/30/2019, 01/17/2023   PFIZER(Purple Top)SARS-COV-2 Vaccination 03/01/2020, 03/22/2020   Pneumococcal Conjugate-13 11/12/2015   Pneumococcal-Unspecified 12/25/2009   Td 02/10/2011, 03/12/2023    Family History  Problem Relation Age of Onset   Allergies Mother    Diabetes Mother    Hypertension Mother    Hyperlipidemia Mother    Migraines Mother    Hyperlipidemia Father    Hypertension Father    Diabetes Father    Stroke Father    Hyperlipidemia Sister    Hypertension Sister    Meniere's disease Sister    Diabetes Sister    COPD Maternal Grandfather    Leukemia Paternal Grandmother    Heart attack Paternal Grandfather    Colon cancer Neg Hx    Esophageal cancer Neg Hx    Rectal cancer Neg Hx    Stomach cancer Neg Hx      Current Outpatient Medications:    amLODipine (NORVASC) 5 MG tablet, Take 1 tablet (5 mg total) by mouth daily., Disp: 90 tablet, Rfl: 1   Galcanezumab-gnlm (EMGALITY) 120 MG/ML SOSY, INJECT 120MG  UNDER THE SKIN EVERY 30 DAYS, Disp: 1 mL, Rfl: 11   ibuprofen (ADVIL) 800 MG tablet, Take 1 tablet (800 mg total) by mouth every 8 (eight) hours as needed (pain)., Disp: 21 tablet, Rfl: 0   Multiple Vitamin (MULTIVITAMIN WITH MINERALS) TABS tablet, Take 1 tablet by  mouth daily., Disp: , Rfl:    Rimegepant Sulfate (NURTEC) 75 MG TBDP, Take 75 mg by mouth daily as needed., Disp: 16 tablet, Rfl: 11   rosuvastatin (CRESTOR) 10 MG tablet, Take 1 tablet (10 mg total) by mouth daily., Disp: 30 tablet, Rfl: 2   spironolactone (ALDACTONE) 25 MG tablet, Take 0.5 tablets (12.5 mg total) by mouth daily., Disp: 45 tablet, Rfl: 3   topiramate (TOPAMAX) 25 MG tablet, Take 1 tablet (25 mg total) by mouth at bedtime., Disp: 30 tablet, Rfl: 8   Vitamin D, Ergocalciferol, (DRISDOL) 1.25 MG (50000 UNIT) CAPS capsule, TAKE 1 CAPSULE BY MOUTH EVERY 7 DAYS, Disp: 12 capsule, Rfl: 1   albuterol (VENTOLIN HFA) 108 (90 Base) MCG/ACT inhaler, Inhale 2 puffs into the lungs every 6 (six) hours as needed for wheezing or shortness of breath., Disp: 8 g, Rfl: 3   Tiotropium Bromide Monohydrate (SPIRIVA RESPIMAT) 1.25 MCG/ACT AERS, Inhale 2 puffs into the lungs daily., Disp: 4 g, Rfl: 6      Objective:   Vitals:   05/14/23 1052  BP: 120/70  Pulse: 97  SpO2: 92%  Weight: 226 lb (102.5 kg)  Height: 5\' 5"  (1.651 m)    Estimated body mass index is 37.61 kg/m as calculated from the following:   Height as of this encounter: 5\' 5"  (1.651 m).   Weight as of this encounter: 226 lb (102.5 kg).  @WEIGHTCHANGE @  [employer]   05/14/23 1052  Weight: 226 lb (102.5 kg)     Physical Exam    General: No distress. Obese, sun glas Neuro: Alert and Oriented x 3. GCS 15. Speech normal Psych: Pleasant Resp:  Barrel Chest - no.  Wheeze - no, Crackles - no, No overt respiratory distress CVS: Normal heart sounds. Murmurs - no Ext: Stigmata of Connective Tissue Disease - no HEENT: Normal upper airway. PEERL +. No post nasal drip        Assessment:       ICD-10-CM   1. DOE (dyspnea on exertion)  R06.09     2. Pulmonary emphysema, unspecified emphysema type (HCC)  J43.9     3. History of cigarette smoking  Z87.891     4. At risk from passive smoking  Z91.89     5. Pulmonary  sarcoidosis (HCC)  D86.0     6. History of cardiomyopathy  Z86.79          Plan:     Patient Instructions     ICD-10-CM   1. DOE (dyspnea on exertion)  R06.09     2. Pulmonary emphysema, unspecified emphysema type (HCC)  J43.9     3. History of cigarette smoking  Z87.891     4. At risk from passive smoking  Z91.89     5. Pulmonary sarcoidosis (HCC)  D86.0     6. History of cardiomyopathy  Z2.79      Glad you are feeling better after Spiriva and Aldactone. I think a mild amount of emphysema from previous smoking and also passive smoking.  Can   Plan  - Continue Spiriva Respimat 2 puffs once daily -CMA to make sure you have albuterol as needed -Check blood alpha-1 antitrypsin phenotype today 05/14/2023 -Repeat pulmonary function test in 6 months -Keep cardiology appointment  Follow-up --Return in 6 months to see Dr. Marchelle Gearing or sooner if needed    SIGNATURE    Dr. Kalman Shan, M.D., F.C.C.P,  Pulmonary and Critical Care Medicine Staff Physician, [facility] Director - Interstitial Lung Disease  Program  Pulmonary Fibrosis [facility] at [facility] [place], [place], [address]  Pager: [phone], If no answer or between  15:00h - 7:00h: call [phone] Telephone: [phone]  11:23 AM 05/14/2023

## 2023-05-13 NOTE — Patient Instructions (Signed)
ICD-10-CM   1. DOE (dyspnea on exertion)  R06.09     2. Pulmonary sarcoidosis (HCC)  D86.0     3. History of cardiomyopathy  Z86.79     4. History of cigarette smoking  Z87.891     5. Pulmonary emphysema, unspecified emphysema type (HCC)  J43.9      You are having post-COVID shortness of breath which I suspect could be COVID long-haul or physical deconditioning or cardiac issues.  Possible but unlikely some background mild emphysema due to prior smoking is contributing to her shortness of breath.  The pulmonary sarcoidosis is not active.  Pulmonary function test appears stable/mildly decline possibly due to aging  Plan  - Start empiric Spiriva Respimat 2 puffs once daily - Keep up cardiac workup with Dr. Klein  -Echo and possibly cardiac stress test  -If cardiac workup is negative would consider pulmonary stress test  Follow-up - Call us back in 4 weeks if the Spiriva is helping you -Return in 3 months to see Dr. Maretta Overdorf or sooner if needed 

## 2023-05-14 ENCOUNTER — Encounter: Payer: Self-pay | Admitting: Internal Medicine

## 2023-05-14 ENCOUNTER — Ambulatory Visit: Payer: BC Managed Care – PPO | Admitting: Internal Medicine

## 2023-05-14 VITALS — BP 120/70 | HR 97 | Ht 65.0 in | Wt 226.0 lb

## 2023-05-14 DIAGNOSIS — Z87891 Personal history of nicotine dependence: Secondary | ICD-10-CM

## 2023-05-14 DIAGNOSIS — Z9189 Other specified personal risk factors, not elsewhere classified: Secondary | ICD-10-CM

## 2023-05-14 DIAGNOSIS — J439 Emphysema, unspecified: Secondary | ICD-10-CM | POA: Diagnosis not present

## 2023-05-14 DIAGNOSIS — D860 Sarcoidosis of lung: Secondary | ICD-10-CM

## 2023-05-14 DIAGNOSIS — R0609 Other forms of dyspnea: Secondary | ICD-10-CM

## 2023-05-14 DIAGNOSIS — Z8679 Personal history of other diseases of the circulatory system: Secondary | ICD-10-CM

## 2023-05-14 MED ORDER — ALBUTEROL SULFATE HFA 108 (90 BASE) MCG/ACT IN AERS: 2.0000 | INHALATION_SPRAY | Freq: Four times a day (QID) | RESPIRATORY_TRACT | 3 refills | Status: DC | PRN

## 2023-05-14 MED ORDER — SPIRIVA RESPIMAT 1.25 MCG/ACT IN AERS: 2.0000 | INHALATION_SPRAY | Freq: Every day | RESPIRATORY_TRACT | 6 refills | Status: AC

## 2023-05-23 ENCOUNTER — Other Ambulatory Visit (HOSPITAL_COMMUNITY): Payer: Self-pay

## 2023-05-27 LAB — ALPHA-1 ANTITRYPSIN PHENOTYPE: A-1 Antitrypsin, Ser: 128 mg/dL (ref 83–199)

## 2023-06-06 ENCOUNTER — Ambulatory Visit: Payer: BC Managed Care – PPO | Admitting: Neurology

## 2023-06-21 ENCOUNTER — Ambulatory Visit (INDEPENDENT_AMBULATORY_CARE_PROVIDER_SITE_OTHER): Payer: BC Managed Care – PPO

## 2023-06-21 DIAGNOSIS — I429 Cardiomyopathy, unspecified: Secondary | ICD-10-CM | POA: Diagnosis not present

## 2023-06-21 LAB — CUP PACEART REMOTE DEVICE CHECK
Battery Remaining Longevity: 44 mo
Battery Voltage: 2.92 V
Brady Statistic AP VP Percent: 0 %
Brady Statistic AP VS Percent: 0.01 %
Brady Statistic AS VP Percent: 0.03 %
Brady Statistic AS VS Percent: 99.96 %
Brady Statistic RA Percent Paced: 0.01 %
Brady Statistic RV Percent Paced: 0.03 %
Date Time Interrogation Session: 20240627043622
HighPow Impedance: 44 Ohm
HighPow Impedance: 54 Ohm
Implantable Lead Connection Status: 753985
Implantable Lead Connection Status: 753985
Implantable Lead Implant Date: 20120308
Implantable Lead Implant Date: 20120308
Implantable Lead Location: 753859
Implantable Lead Location: 753860
Implantable Lead Model: 5076
Implantable Lead Model: 6947
Implantable Pulse Generator Implant Date: 20190913
Lead Channel Impedance Value: 266 Ohm
Lead Channel Impedance Value: 342 Ohm
Lead Channel Impedance Value: 399 Ohm
Lead Channel Pacing Threshold Amplitude: 0.375 V
Lead Channel Pacing Threshold Amplitude: 0.75 V
Lead Channel Pacing Threshold Pulse Width: 0.4 ms
Lead Channel Pacing Threshold Pulse Width: 0.4 ms
Lead Channel Sensing Intrinsic Amplitude: 2.875 mV
Lead Channel Sensing Intrinsic Amplitude: 2.875 mV
Lead Channel Sensing Intrinsic Amplitude: 6.625 mV
Lead Channel Sensing Intrinsic Amplitude: 6.625 mV
Lead Channel Setting Pacing Amplitude: 1.5 V
Lead Channel Setting Pacing Amplitude: 2 V
Lead Channel Setting Pacing Pulse Width: 0.4 ms
Lead Channel Setting Sensing Sensitivity: 0.45 mV
Zone Setting Status: 755011
Zone Setting Status: 755011

## 2023-07-05 NOTE — Progress Notes (Signed)
NEUROLOGY FOLLOW UP OFFICE NOTE  Maicey Dumas-Hightower [id number]  Assessment/Plan:   1.  Migraine without aura, without status migrainosus, intractable 2.  Hemiplegic migraine 3.  Idiopathic intracranial hypertension 4.  Cervicalgia   1.  Migraine prevention:  Emgality every 28 days, topiramate 25mg  at bedtime.  Also Nurtec daily for last 8 days prior to next Emgality injection 2.  Migraine rescue:  Nurtec 3.  Limit use of pain relievers to no more than 2 days out of week to prevent risk of rebound or medication-overuse headache. 4.  Keep headache diary 5.  Follow up 1 year.  Subjective:  Cherylyn Dumas-Hightower is a 52 year old woman with sarcoidosis, ventricular tachycardia status post defibrillator, and history of idiopathic intracranial hypertension who follows up for migraines.   UPDATE: Due to causing drowsiness, switched from acetazolamide to topiramate.  Doing well. Intensity: moderate Duration:  2 hours with Nurtec Frequency:  5 a month (usually last week prior to injection or just after taking injection Less that 1 hemiplegic migraine a month. drowsiness.  Robaxin not covered.    Preventative management:  Emgality monthly, acetazolamide 250mg  BID, Nurtec daily 8 days prior to [facility] injection. Rescue management:  Nurtec Current NSAIDS:  none Current analgesics:  Tylenol Current triptans:  Contraindicated (ventricular tachycardia and stroke-like symptoms) Current ergotamine:  no Current anti-emetic:  no Current muscle relaxants:  no Current anti-anxiolytic:  no Current sleep aide:  no Current Antihypertensive medications:  no Current Antidepressant medications:  no Current Anticonvulsant medications: topiramate 25mg  at bedtime Current anti-CGRP:   Emgality, Nurtec Current Vitamins/Herbal/Supplements:  no Current Antihistamines/Decongestants:  no Other therapy:  no Hormone/Birth control:  No   Caffeine:  no Alcohol:  no Smoker:  no Diet:   hydrates Exercise:  walks Depression:  no; Anxiety:  no Other pain:  Pain related to sarcoidosis Sleep hygiene:  okay   HISTORY: She was diagnosed with pseudotumor cerebri in January 2009.  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  She underwent lumbar puncture on 01/24/08 which demonstrated normal opening pressure of 14 cm H2O with CSF glucose 68, protein 23 and 0 cell count.  She apparently had an MRI and MRV of the head performed, which were reportedly normal. She was on acetazolamide for awhile, which was subsequently discontinued due to resolution of papilledema.  She sees the ophthalmologist annually.  Those headaches were dull right sided posterior headaches. She had a repeat eye exam with Dr. Dione Booze in November 2019, which showed mild blurring of the nasal disc margin but nothing dramatic.    She began experiencing migraines around age 51.  They are right frontal/temporal, non-throbbing and severe.  They are associated with nausea, photophobia, phonophobia, osmophobia, but not vomiting.  She sometimes sees white spots in her vision and has associated right upper and lower extremity numbness and weakness.  They typically last 2 days and occur 2 to 3 times a month but have increased in frequency.  No known triggers.  Relieved by rest.   Past NSAIDS:  naproxen 550mg  (drowsiness), Advil Migraine, Sprix NS (ineffective and caused burning), Cambia (effective but expensive) Past analgesics:  Tylenol, Excedrin Past abortive triptans:  no Past muscle relaxants:  Flexeril, tizanidine, Robaxin Past anti-emetic:  Zofran ODT 4mg  Past antihypertensive medications:  Toprol XL 25mg  Past antidepressant medications:  no Past anticonvulsant medications:   topiramate 100mg  at bedtime (150mg  caused cognitive deficits) Past anti--CGRP: Aimovig (possible hair loss) Past vitamins/Herbal/Supplements:  no Past antihistamines/decongestants:  no Other  past therapies:   acetazolamide   Family history of headache:  mother    PAST MEDICAL HISTORY: Past Medical History:  Diagnosis Date   Headache    Lupus pernio    -skin bx confirmed at [facility] 05/07/2009 (personally reviewed) -looks improved 07/09/2009 -   Pertussis    Pseudotumor cerebri    .Marland KitchenMarland KitchenDr Hyacinth Meeker and Dr. Dione Booze - dxed Jan 2009, on diamox  - LP 01/24/2008 at [facility] - Normal ( pressure 14cm, gluc 68, Protein 23, Clear, colorless, RBC 3, WBC - too few to count)  - CT head non contrast done at [facility] 11/25/2007: "normal". Ddoes not report MRI, MRA, MRV being done - 2nd opinon by Dr. Pearlean Brownie - reportdly had MRI/MRV both normal; followup pending   Pulmonary sarcoidosis (HCC)    - Cleda Daub 07/25/2010 on pred 10/mtx 10 -> FEv1 1.84L/775%, FVC 2.25L - SPiro 09/06/2010 -> Fev1 2.14L/81%. Cut pred to 5mg  per day. Cont mtx - SPiro 01/20/2011 - Fev1 1.6L/61%, Ratio 74%. CXR Worse -> increased pred to 10mg  perday. Cont Mtx.     Ventricular tachycardia (HCC)    Vitamin D deficiency    - Aug 2011: 22 and low -> commence Rx. - Jan 2012: 11    MEDICATIONS: Current Outpatient Medications on File Prior to Visit  Medication Sig Dispense Refill   albuterol (VENTOLIN HFA) 108 (90 Base) MCG/ACT inhaler Inhale 2 puffs into the lungs every 6 (six) hours as needed for wheezing or shortness of breath. 8 g 3   amLODipine (NORVASC) 5 MG tablet Take 1 tablet (5 mg total) by mouth daily. 90 tablet 1   Galcanezumab-gnlm (EMGALITY) 120 MG/ML SOSY INJECT 120MG  UNDER THE SKIN EVERY 30 DAYS 1 mL 11   ibuprofen (ADVIL) 800 MG tablet Take 1 tablet (800 mg total) by mouth every 8 (eight) hours as needed (pain). 21 tablet 0   Multiple Vitamin (MULTIVITAMIN WITH MINERALS) TABS tablet Take 1 tablet by mouth daily.     Rimegepant Sulfate (NURTEC) 75 MG TBDP Take 75 mg by mouth daily as needed. 16 tablet 11   rosuvastatin (CRESTOR) 10 MG tablet Take 1 tablet (10 mg total) by mouth daily. 30 tablet 2   spironolactone (ALDACTONE) 25  MG tablet Take 0.5 tablets (12.5 mg total) by mouth daily. 45 tablet 3   Tiotropium Bromide Monohydrate (SPIRIVA RESPIMAT) 1.25 MCG/ACT AERS Inhale 2 puffs into the lungs daily. 4 g 6   topiramate (TOPAMAX) 25 MG tablet Take 1 tablet (25 mg total) by mouth at bedtime. 30 tablet 8   Vitamin D, Ergocalciferol, (DRISDOL) 1.25 MG (50000 UNIT) CAPS capsule TAKE 1 CAPSULE BY MOUTH EVERY 7 DAYS 12 capsule 1   No current facility-administered medications on file prior to visit.     ALLERGIES: No Known Allergies  FAMILY HISTORY: Family History  Problem Relation Age of Onset   Allergies Mother    Diabetes Mother    Hypertension Mother    Hyperlipidemia Mother    Migraines Mother    Hyperlipidemia Father    Hypertension Father    Diabetes Father    Stroke Father    Hyperlipidemia Sister    Hypertension Sister    Meniere's disease Sister    Diabetes Sister    COPD Maternal Grandfather    Leukemia Paternal Grandmother    Heart attack Paternal Grandfather    Colon cancer Neg Hx    Esophageal cancer Neg Hx    Rectal cancer Neg Hx    Stomach cancer  Neg Hx       Objective:  Blood pressure (!) 141/83, pulse 95, resp. rate 18, height 5\' 5"  (1.651 m), weight 231 lb (104.8 kg), last menstrual period 12/25/2006, SpO2 99%. General: No acute distress.  Patient appears well-groomed.   Head:  Normocephalic/atraumatic Eyes:  Fundi examined but not visualized Neck: supple, no paraspinal tenderness, full range of motion Heart:  Regular rate and rhythm Neurological Exam: alert and oriented.  Speech fluent and not dysarthric, language intact.  CN II-XII intact. Bulk and tone normal, muscle strength 5/5 throughout.  Sensation to light touch intact.  Deep tendon reflexes 2+ throughout.  Finger to nose testing intact.  Gait normal, Romberg negative.   Shon Millet, DO  CC: Rodman Pickle, NP

## 2023-07-07 ENCOUNTER — Other Ambulatory Visit: Payer: Self-pay | Admitting: Neurology

## 2023-07-09 ENCOUNTER — Ambulatory Visit: Payer: BC Managed Care – PPO | Admitting: Neurology

## 2023-07-09 ENCOUNTER — Encounter: Payer: Self-pay | Admitting: Neurology

## 2023-07-09 DIAGNOSIS — G43409 Hemiplegic migraine, not intractable, without status migrainosus: Secondary | ICD-10-CM | POA: Diagnosis not present

## 2023-07-09 MED ORDER — TOPIRAMATE 25 MG PO TABS: 25.0000 mg | ORAL_TABLET | Freq: Every day | ORAL | 3 refills | Status: DC

## 2023-07-09 MED ORDER — EMGALITY 120 MG/ML ~~LOC~~ SOSY: PREFILLED_SYRINGE | SUBCUTANEOUS | 11 refills | Status: DC

## 2023-07-09 MED ORDER — NURTEC 75 MG PO TBDP: 75.0000 mg | ORAL_TABLET | Freq: Every day | ORAL | 11 refills | Status: DC | PRN

## 2023-07-10 NOTE — Progress Notes (Signed)
 Remote ICD transmission.   

## 2023-07-18 ENCOUNTER — Encounter: Payer: Self-pay | Admitting: Nurse Practitioner

## 2023-07-18 ENCOUNTER — Ambulatory Visit: Payer: BC Managed Care – PPO | Admitting: Nurse Practitioner

## 2023-07-18 VITALS — BP 138/88 | HR 93 | Temp 98.7°F | Ht 65.0 in | Wt 226.0 lb

## 2023-07-18 DIAGNOSIS — M25511 Pain in right shoulder: Secondary | ICD-10-CM

## 2023-07-18 DIAGNOSIS — G8929 Other chronic pain: Secondary | ICD-10-CM

## 2023-07-18 DIAGNOSIS — R252 Cramp and spasm: Secondary | ICD-10-CM | POA: Diagnosis not present

## 2023-07-18 DIAGNOSIS — E7800 Pure hypercholesterolemia, unspecified: Secondary | ICD-10-CM | POA: Diagnosis not present

## 2023-07-18 DIAGNOSIS — R7303 Prediabetes: Secondary | ICD-10-CM | POA: Diagnosis not present

## 2023-07-18 DIAGNOSIS — I10 Essential (primary) hypertension: Secondary | ICD-10-CM | POA: Diagnosis not present

## 2023-07-18 LAB — COMPREHENSIVE METABOLIC PANEL
ALT: 20 U/L (ref 0–35)
AST: 13 U/L (ref 0–37)
Albumin: 4.2 g/dL (ref 3.5–5.2)
Alkaline Phosphatase: 78 U/L (ref 39–117)
BUN: 11 mg/dL (ref 6–23)
CO2: 31 mEq/L (ref 19–32)
Calcium: 9.8 mg/dL (ref 8.4–10.5)
Chloride: 102 mEq/L (ref 96–112)
Creatinine, Ser: 0.73 mg/dL (ref 0.40–1.20)
GFR: 96.13 mL/min (ref >60.00)
Glucose, Bld: 101 mg/dL — ABNORMAL HIGH (ref 70–99)
Potassium: 4.5 mEq/L (ref 3.5–5.1)
Sodium: 140 mEq/L (ref 135–145)
Total Bilirubin: 0.3 mg/dL (ref 0.2–1.2)
Total Protein: 6.8 g/dL (ref 6.0–8.3)

## 2023-07-18 LAB — CK: Total CK: 61 U/L (ref 7–177)

## 2023-07-18 LAB — HEMOGLOBIN A1C: Hgb A1c MFr Bld: 6.3 % (ref 4.6–6.5)

## 2023-07-18 LAB — LIPID PANEL
Cholesterol: 176 mg/dL (ref 0–200)
HDL: 48.2 mg/dL (ref >39.00)
LDL Cholesterol: 97 mg/dL (ref 0–99)
NonHDL: 128.21
Total CHOL/HDL Ratio: 4
Triglycerides: 155 mg/dL — ABNORMAL HIGH (ref 0.0–149.0)
VLDL: 31 mg/dL (ref 0.0–40.0)

## 2023-07-18 LAB — CBC WITH DIFFERENTIAL/PLATELET
Basophils Absolute: 0 10*3/uL (ref 0.0–0.1)
Basophils Relative: 0.3 % (ref 0.0–3.0)
Eosinophils Absolute: 0.2 10*3/uL (ref 0.0–0.7)
Eosinophils Relative: 1.5 % (ref 0.0–5.0)
HCT: 38.2 % (ref 36.0–46.0)
Hemoglobin: 12.2 g/dL (ref 12.0–15.0)
Lymphocytes Relative: 27.5 % (ref 12.0–46.0)
Lymphs Abs: 2.9 10*3/uL (ref 0.7–4.0)
MCHC: 32 g/dL (ref 30.0–36.0)
MCV: 88.3 fl (ref 78.0–100.0)
Monocytes Absolute: 0.6 10*3/uL (ref 0.1–1.0)
Monocytes Relative: 6 % (ref 3.0–12.0)
Neutro Abs: 6.7 10*3/uL (ref 1.4–7.7)
Neutrophils Relative %: 64.7 % (ref 43.0–77.0)
Platelets: 436 10*3/uL — ABNORMAL HIGH (ref 150.0–400.0)
RBC: 4.32 Mil/uL (ref 3.87–5.11)
RDW: 14.4 % (ref 11.5–15.5)
WBC: 10.4 10*3/uL (ref 4.0–10.5)

## 2023-07-18 MED ORDER — MELOXICAM 7.5 MG PO TABS
7.5000 mg | ORAL_TABLET | Freq: Every day | ORAL | 0 refills | Status: DC
Start: 2023-07-18 — End: 2023-09-03

## 2023-07-18 NOTE — Assessment & Plan Note (Signed)
She has a history of bursitis in her right shoulder and has chronic pain from this.  However 1 week ago, when she woke up her neck was hurting it was well.  This may be related to muscle strain versus radiculopathy.  Will have her start meloxicam 7.5 mg daily.  If she is not having any improvement, will place referral to orthopedics.

## 2023-07-18 NOTE — Assessment & Plan Note (Signed)
Chronic, ongoing.  Her last A1c was 6.6%.  She has made adjustments to her diet.  Will check A1c today and treat based on results.

## 2023-07-18 NOTE — Assessment & Plan Note (Signed)
Chronic, stable.  Continue amlodipine 5 mg daily and spironolactone 12.5 mg daily.  Follow-up in 6 months.

## 2023-07-18 NOTE — Assessment & Plan Note (Signed)
He was started on rosuvastatin 10 mg daily last visit.  She states that she typically has muscle cramping in her legs at night, however it did slightly worsen when she started the rosuvastatin.  Will check a CMP, CBC, lipid panel and CK today.  Encourage plenty of fluids.

## 2023-07-18 NOTE — Patient Instructions (Signed)
It was great to see you!  We are checking your labs today and will let you know the results via mychart/phone.   Start mobic 1 tablet daily with food for your neck and shoulder pain. Let me know if it doesn't help.   Keep up the great work with your nutrition and exercise.   Let's follow-up in 6 months, sooner if you have concerns.  If a referral was placed today, you will be contacted for an appointment. Please note that routine referrals can sometimes take up to 3-4 weeks to process. Please call our office if you haven't heard anything after this time frame.  Take care,  Rodman Pickle, NP

## 2023-07-18 NOTE — Progress Notes (Signed)
Established Patient Office Visit  Subjective   Patient ID: Alexandra Hood, female    DOB: 22-Jul-1971  Age: 52 y.o. MRN: [id number]  Chief Complaint  Patient presents with   Neck Pain    Right side neck and should pain    HPI  Alexandra Hood is here to follow-up on shoulder pain and prediabetes.   She has a history of bursitis in her right shoulder. She woke up last week with pain in her right neck as well. Now she is able to move her neck more, but it is still painful. The pain does radiate down her arm to her elbow. She has tried lidocaine patches and Tylenol takes some of the pain away. She has also been stretching daily.   She states that she has been trying to adjust her diet, limiting sugars and she has been walking regularly.  She denies chest pain, shortness of breath, urinary frequency.    ROS See pertinent positives and negatives per HPI.    Objective:     BP 138/88 (BP Location: Right Arm, Cuff Size: Large)   Pulse 93   Temp 98.7 F (37.1 C) (Oral)   Ht 5\' 5"  (1.651 m)   Wt 226 lb (102.5 kg)   LMP 12/25/2006   SpO2 96%   BMI 37.61 kg/m  BP Readings from Last 3 Encounters:  07/18/23 138/88  07/09/23 118/80  05/14/23 120/70   Wt Readings from Last 3 Encounters:  07/18/23 226 lb (102.5 kg)  07/09/23 231 lb (104.8 kg)  05/14/23 226 lb (102.5 kg)      Physical Exam Vitals and nursing note reviewed.  Constitutional:      General: She is not in acute distress.    Appearance: Normal appearance.  HENT:     Head: Normocephalic.  Eyes:     Conjunctiva/sclera: Conjunctivae normal.  Cardiovascular:     Rate and Rhythm: Normal rate and regular rhythm.     Pulses: Normal pulses.     Heart sounds: Normal heart sounds.  Pulmonary:     Effort: Pulmonary effort is normal.     Breath sounds: Normal breath sounds.  Musculoskeletal:        General: Tenderness (right anterior shoulder) present. No swelling. Normal range of motion.     Cervical  back: Normal range of motion.  Skin:    General: Skin is warm.  Neurological:     General: No focal deficit present.     Mental Status: She is alert and oriented to person, place, and time.  Psychiatric:        Mood and Affect: Mood normal.        Behavior: Behavior normal.        Thought Content: Thought content normal.        Judgment: Judgment normal.    The 10-year ASCVD risk score (Arnett DK, et al., 2019) is: 5.9%    Assessment & Plan:   Problem List Items Addressed This Visit       Cardiovascular and Mediastinum   Primary hypertension - Primary    Chronic, stable.  Continue amlodipine 5 mg daily and spironolactone 12.5 mg daily.  Follow-up in 6 months.        Other   Pre-diabetes    Chronic, ongoing.  Her last A1c was 6.6%.  She has made adjustments to her diet.  Will check A1c today and treat based on results.      Relevant Orders   Hemoglobin A1c  Chronic right shoulder pain    She has a history of bursitis in her right shoulder and has chronic pain from this.  However 1 week ago, when she woke up her neck was hurting it was well.  This may be related to muscle strain versus radiculopathy.  Will have her start meloxicam 7.5 mg daily.  If she is not having any improvement, will place referral to orthopedics.      Relevant Medications   meloxicam (MOBIC) 7.5 MG tablet   Pure hypercholesterolemia    He was started on rosuvastatin 10 mg daily last visit.  She states that she typically has muscle cramping in her legs at night, however it did slightly worsen when she started the rosuvastatin.  Will check a CMP, CBC, lipid panel and CK today.  Encourage plenty of fluids.      Relevant Orders   CBC with Differential/Platelet   Comprehensive metabolic panel   Lipid panel   Other Visit Diagnoses     Muscle cramping       She has muscle cramping almost daily before starting rosuvastatin, however it has slightly increased.  Will check CK today.   Relevant Orders    CK (Creatine Kinase)       Return in about 6 months (around 01/18/2024) for CPE.    Gerre Scull, NP

## 2023-07-19 ENCOUNTER — Other Ambulatory Visit (HOSPITAL_COMMUNITY): Payer: Self-pay

## 2023-08-03 ENCOUNTER — Other Ambulatory Visit: Payer: Self-pay | Admitting: Nurse Practitioner

## 2023-09-02 ENCOUNTER — Other Ambulatory Visit: Payer: Self-pay | Admitting: Nurse Practitioner

## 2023-09-03 NOTE — Telephone Encounter (Signed)
Requesting: MELOXICAM 7.5MG  TABLETS , AMLODIPINE BESYLATE 5MG  TABLETS  Last Visit: 07/18/2023 Next Visit: 01/25/2024 Last Refill: 07/18/2023, 03/12/2023  Please Advise

## 2023-09-20 ENCOUNTER — Ambulatory Visit (INDEPENDENT_AMBULATORY_CARE_PROVIDER_SITE_OTHER): Payer: BC Managed Care – PPO

## 2023-09-20 DIAGNOSIS — I429 Cardiomyopathy, unspecified: Secondary | ICD-10-CM

## 2023-09-20 LAB — CUP PACEART REMOTE DEVICE CHECK
Battery Remaining Longevity: 41 mo
Battery Voltage: 2.9 V
Brady Statistic AP VP Percent: 0 %
Brady Statistic AP VS Percent: 0.01 %
Brady Statistic AS VP Percent: 0.03 %
Brady Statistic AS VS Percent: 99.96 %
Brady Statistic RA Percent Paced: 0.01 %
Brady Statistic RV Percent Paced: 0.03 %
Date Time Interrogation Session: 20240926001703
HighPow Impedance: 48 Ohm
HighPow Impedance: 61 Ohm
Implantable Lead Connection Status: 753985
Implantable Lead Connection Status: 753985
Implantable Lead Implant Date: 20120308
Implantable Lead Implant Date: 20120308
Implantable Lead Location: 753859
Implantable Lead Location: 753860
Implantable Lead Model: 5076
Implantable Lead Model: 6947
Implantable Pulse Generator Implant Date: 20190913
Lead Channel Impedance Value: 285 Ohm
Lead Channel Impedance Value: 380 Ohm
Lead Channel Impedance Value: 437 Ohm
Lead Channel Pacing Threshold Amplitude: 0.375 V
Lead Channel Pacing Threshold Amplitude: 0.625 V
Lead Channel Pacing Threshold Pulse Width: 0.4 ms
Lead Channel Pacing Threshold Pulse Width: 0.4 ms
Lead Channel Sensing Intrinsic Amplitude: 2 mV
Lead Channel Sensing Intrinsic Amplitude: 2 mV
Lead Channel Sensing Intrinsic Amplitude: 6.125 mV
Lead Channel Sensing Intrinsic Amplitude: 6.125 mV
Lead Channel Setting Pacing Amplitude: 1.5 V
Lead Channel Setting Pacing Amplitude: 2 V
Lead Channel Setting Pacing Pulse Width: 0.4 ms
Lead Channel Setting Sensing Sensitivity: 0.45 mV
Zone Setting Status: 755011
Zone Setting Status: 755011

## 2023-09-28 NOTE — Progress Notes (Signed)
Remote ICD transmission.   

## 2023-10-05 ENCOUNTER — Other Ambulatory Visit: Payer: Self-pay | Admitting: Nurse Practitioner

## 2023-10-05 NOTE — Telephone Encounter (Signed)
Requesting: MELOXICAM 7.5MG  TABLETS  Last Visit: 07/18/2023 Next Visit: 01/25/2024 Last Refill: 09/03/2023  Please Advise

## 2023-11-04 ENCOUNTER — Other Ambulatory Visit: Payer: Self-pay | Admitting: Nurse Practitioner

## 2023-11-05 NOTE — Telephone Encounter (Signed)
Requesting: MELOXICAM 7.5MG  TABLETS, ROSUVASTATIN 10MG  TABLETS  Last Visit: 07/18/2023 Next Visit: 01/25/2024 Last Refill: 10/05/2023, 08/03/2023  Please Advise

## 2023-11-09 ENCOUNTER — Telehealth: Payer: Self-pay | Admitting: Neurology

## 2023-11-09 MED ORDER — PREDNISONE 10 MG (21) PO TBPK: ORAL_TABLET | ORAL | 0 refills | Status: DC

## 2023-11-09 NOTE — Telephone Encounter (Signed)
Prednisone called in to the pharmacy per Dr.Jaffe.

## 2023-11-09 NOTE — Telephone Encounter (Signed)
Patient wants to know if she can come for a headache injection as she has had this headache for 6 days now

## 2023-11-12 ENCOUNTER — Ambulatory Visit (INDEPENDENT_AMBULATORY_CARE_PROVIDER_SITE_OTHER): Payer: BC Managed Care – PPO | Admitting: Internal Medicine

## 2023-11-12 ENCOUNTER — Encounter: Payer: Self-pay | Admitting: Internal Medicine

## 2023-11-12 ENCOUNTER — Ambulatory Visit (HOSPITAL_BASED_OUTPATIENT_CLINIC_OR_DEPARTMENT_OTHER): Payer: BC Managed Care – PPO | Admitting: Internal Medicine

## 2023-11-12 VITALS — BP 125/81 | HR 93 | Ht 65.0 in | Wt 227.0 lb

## 2023-11-12 DIAGNOSIS — Z9189 Other specified personal risk factors, not elsewhere classified: Secondary | ICD-10-CM

## 2023-11-12 DIAGNOSIS — J439 Emphysema, unspecified: Secondary | ICD-10-CM | POA: Diagnosis not present

## 2023-11-12 DIAGNOSIS — R0609 Other forms of dyspnea: Secondary | ICD-10-CM | POA: Diagnosis not present

## 2023-11-12 DIAGNOSIS — D860 Sarcoidosis of lung: Secondary | ICD-10-CM

## 2023-11-12 LAB — PULMONARY FUNCTION TEST
DL/VA % pred: 125 %
DL/VA: 5.39 ml/min/mmHg/L
DLCO cor % pred: 82 %
DLCO cor: 17.75 ml/min/mmHg
DLCO unc % pred: 82 %
DLCO unc: 17.75 ml/min/mmHg
FEF 25-75 Post: 3.11 L/s
FEF 25-75 Pre: 2.88 L/s
FEF2575-%Change-Post: 7 %
FEF2575-%Pred-Post: 111 %
FEF2575-%Pred-Pre: 103 %
FEV1-%Change-Post: 2 %
FEV1-%Pred-Post: 66 %
FEV1-%Pred-Pre: 64 %
FEV1-Post: 1.89 L
FEV1-Pre: 1.84 L
FEV1FVC-%Change-Post: -1 %
FEV1FVC-%Pred-Pre: 111 %
FEV6-%Change-Post: 4 %
FEV6-%Pred-Post: 61 %
FEV6-%Pred-Pre: 58 %
FEV6-Post: 2.15 L
FEV6-Pre: 2.07 L
FEV6FVC-%Pred-Post: 102 %
FEV6FVC-%Pred-Pre: 102 %
FVC-%Change-Post: 4 %
FVC-%Pred-Post: 59 %
FVC-%Pred-Pre: 57 %
FVC-Post: 2.15 L
FVC-Pre: 2.07 L
Post FEV1/FVC ratio: 88 %
Post FEV6/FVC ratio: 100 %
Pre FEV1/FVC ratio: 89 %
Pre FEV6/FVC Ratio: 100 %
RV % pred: 52 %
RV: 0.94 L
TLC % pred: 61 %
TLC: 3.17 L

## 2023-11-12 NOTE — Patient Instructions (Addendum)
ICD-10-CM   1. Pulmonary emphysema, unspecified emphysema type (HCC)  J43.9     2. At risk from passive smoking  Z91.89     3. Pulmonary sarcoidosis (HCC)  D86.0       Glad you are feeling better after Spiriva and Aldactone. I think a mild amount of emphysema from previous smoking and also passive smoking.     Plan  - Continue Spiriva Respimat 2 puffs once daily - have flu shot as soon as possible; deferred at your request 11/12/2023   Follow-up --Return in 12 months to see Dr. Marchelle Gearing or sooner if needed

## 2023-11-12 NOTE — Progress Notes (Signed)
.     Alexandra Hood 07/06/2014  Chief Complaint  Patient presents with   Follow-up    Pt states her breathing has slightly improved since last OV, pt is able to do more activities. Pt c/o RUQ pain with inspiration. Denies cough and CP/tightness. PT states she has DVT in left lower leg that pt stated she noticed last week.    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 I cut the Medrol to 2 mg once daily. Then on 05/22/2014 she went to the emergency room with abdominal pain right upper quadrant. Apparently this time she did have some fever low grade along with some yellow sputum, right pleuritic chest pain and shortness of breath. CT scan of the abdomen did show some right lower lobe pulmonary infiltrates and she was given Z-Pak. After these all the symptoms resolved except for the right pleuritic chest pain which has largely improved although still persistent and is mild. She did followup with my nurse practitioner 05/28/2014 and had a chest x-ray that shows prominence in the right lower lobe infiltrates suggesting the presence of pneumonia. She had ACEl checked which was 80 and elevated. She's been asked to take Medrol at 4 mg once daily which is doing currently . Overall she is well . Spirometry today is baseline with an FVC of 2.3 the/78%. FEV1 of 1.88 L or 74% and a ratio of 80/96%.   OF note a few days ago she did notice that both calves swelled up and then spontaneously settled. She thinks she had a "blood clot" was no worsening dyspnea or cough.    #sarcoidosis  - continue medrol at 4mg  per day  #Pneumoia Right lower lobe 05/22/14  - glad you are better but we need to keep an eye on the right sided pain  - if pain gets worse call us  - Do CT chest wo contrast mid-end Aug 2015  #Followup -  - Do CT chest wo contrast mid-end Aug 2015 - return to see me or my NP after CT   OV 08/17/2014  Chief Complaint  Patient  presents with   Follow-up    review CT results. Pt denies any increase in SOB, cough, chest tightness.    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 But she had "pneumonia" and some chest pain in spring/summer. So we decided to get CT chest aug 2015. CT shows improvement in UL sarcoid and down to a scar compared to 2010 but lower lobe she has worsening nodular disease. She herself is asymptomatic and feeling well and more keen on tapering off steroids. Denies new problems. 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  However, there are multiple peribronchovascular areas of ground glass and nodularity, in a perilymphatic distribution, new from 2010 but seen on CT AP 05/22/14 (lung bases).  Looks to me like sarcoid progression.  Atypical infection is considered less likely.   OV 01/22/2015 Chief Complaint  Patient presents with   Follow-up    Pt stated her breathing has improved since last OV. Pt stated her SOB has improved and she does not become as SOB as before. Pt denies cough and CP/tightness.    52 year old female with  pulmonary sarcoidosis stage II with ACD placement  I'm known her for several years. She used to be on methotrexate. 3 years ago she had normal function and on high-dose of steroids. Last time I saw her was in August 2015 at which time she was asymptomatic and was expressing a strong desire for coming off steroids. However CT scan of the chest at that time showed that she had new worsening sarcoidosis in terms of nodules in the lower lobes even though the upper lobe sarcoidosis: Original problem was improved to just a scar tissue. Currently she tells me she feels the best ever while she is being maintained on Medrol 4 mg per day [she does not like  prednisone]. She's asymptomatic. She had a CT scan of the chest in December 2015 that shows stability compared to August 2015 but is definitely worse compared to 2010. We did a spirometry today and it shows continued decline of lung function with an FEV1 of 1.6 L/66%. She is very surprised by this the technique was good and she is asymptomatic. She is really reluctant to go up on the steroids add methotrexate again  OV 11/12/2015  Chief Complaint  Patient presents with   Follow-up    Pt states she has had a dry cough recently, pt thinks this is due to the smokey air. Pt denies SOB and CP/tightness.    Follow-up pulmonary sarcoidosis stage II with AICD placement  Last seen January 2016. She is now maintained on Medrol 4 mg daily. In January 2016 FEV1 was 1.6 L/66%. She was asymptomatic at that time. Even at that time she was off methotrexate. There was concern for worsening sarcoidosis but after discussion we adopted a weight and watch approach. She currently continues Medrol 4 mg per day. She tells me she continues to be asymptomatic. Spirometry today shows FEV1 1.7 L/67%. There is similar to January 2016. She is a little bit worried about the obstruction in lung function. She is open to re-challenging herself with methotrexate though she does not want to go up on steroids. She wants have a flu shot and pneumonia shot today she continues to work at [employer] in [place]    OV 01/16/2023  Subjective:  Patient ID: Alexandra Hood, female , DOB: August 03, 1971 , age 52 y.o. , MRN: [id number] , ADDRESS: [address] [place] [address] PCP Gerre Scull, NP Patient Care Team: Gerre Scull, NP as PCP - General (Internal Medicine) Duke Salvia, MD as Consulting Physician (Cardiology) Drema Dallas, DO as Consulting Physician (Neurology)  This Provider for this visit: Treatment Team:  Attending Provider: Kalman Shan, MD    01/16/2023 -   Chief  Complaint  Patient presents with   Consult    Pt is here to reestablish care for pulmonary sarcoidosis. Last seen in 2016. Pt states she was doing well until she got covid in September. She states she has more episodes of SOB now. Pt did have albuterol as needed but is out. She states it did help some.      HPI Alexandra Hood 52 y.o. -has a history of pulmonary sarcoidosis.  I personally have not seen her in over 6 years and therefore is a new consult.  Room was seeing her and all clinically had on methotrexate and prednisone.  At that point sarcoid stabilized and she came off methotrexate and prednisone and then she was on observation.  She also had status post defibrillator placed in 2012.  She tells  me in the last 5-6 issues been doing well.  Stable.  Did not feel her sarcoid in the skin or in the lungs were active.  She had a first bout of COVID-19 in September 2023.  Was mild outpatient COVID.  Did not require oxygen.  But then after that she started noticing when she walked her dog she would get short of breath and stop.  This continues to this day.  It is mild to moderate in intensity relieved by exertion.  Not getting worse not getting better.  No leg edema no hemoptysis no chest pain no cough no wheezing no orthopnea no paroxysmal nocturnal dyspnea.  She continues to work in [employer].  She plans to retire in a few years.  Of note she was a previous smoker.  She has seen cardiology was recommended repeat echo and a follow-up pending.  She had a high-resolution CT scan of the chest: She just has old scar tissue that I personally visualized and agree with the findings.  Her pulmonary function test DLCO stable although FVC is declined around 2% a year.  She has some mild emphysema as well.  She suffers from obesity and the weight is stable.  She does not have any interim diabetes.     2016: weight 226# 01/16/2023 - weig 223.8# CT Chest data - HRCT 12/29/22  Narrative &  Impression  CLINICAL DATA:  Sarcoid.   EXAM: CT CHEST WITHOUT CONTRAST   TECHNIQUE: Multidetector CT imaging of the chest was performed following the standard protocol without intravenous contrast. High resolution imaging of the lungs, as well as inspiratory and expiratory imaging, was performed.   RADIATION DOSE REDUCTION: This exam was performed according to the departmental dose-optimization program which includes automated exposure control, adjustment of the mA and/or kV according to patient size and/or use of iterative reconstruction technique.   COMPARISON:  11/25/2018.   FINDINGS: Cardiovascular: Left anterior descending coronary artery calcification. Heart is at the upper limits of normal in size. No pericardial effusion.   Mediastinum/Nodes: No pathologically enlarged mediastinal or axillary lymph nodes. Hilar regions are difficult to definitively evaluate without IV contrast. Esophagus is grossly unremarkable.   Lungs/Pleura: Centrilobular and paraseptal emphysema. Minimal scattered peribronchovascular nodularity and ground-glass, as before. Scarring in the upper lobes. No pleural fluid. Minimal adherent debris in the airway.   Upper Abdomen: Liver is somewhat heterogeneous in attenuation. Visualized portions of the liver, gallbladder, adrenal glands, kidneys, spleen, pancreas, stomach and bowel are otherwise unremarkable. Upper abdominal lymph nodes are not enlarged by CT size criteria.   Musculoskeletal: Degenerative changes in the spine. No worrisome lytic or sclerotic lesions.   IMPRESSION: 1. Minimal scattered peribronchovascular nodularity and ground-glass, similar and compatible with the provided history of sarcoid. 2. Left anterior descending coronary artery calcification. 3. Liver appears steatotic. 4.  Emphysema (ICD10-J43.9).   1.     Electronically Signed   By: Leanna Battles M.D.   On: 01/01/2023 11:51    No results found.   OV  05/14/2023  Subjective:  Patient ID: Alexandra Hood, female , DOB: [birth year] , age 51 y.o. , MRN: [id number] , ADDRESS: [address] [place] [address] PCP Gerre Scull, NP Patient Care Team: Gerre Scull, NP as PCP - General (Internal Medicine) Duke Salvia, MD as Consulting Physician (Cardiology) Drema Dallas, DO as Consulting Physician (Neurology)  This Provider for this visit: Treatment Team:  Attending Provider: Kalman Shan, MD    05/14/2023 -  Chief Complaint  Patient presents with   Follow-up    F/up, no concerns     HPI Alexandra Hood 52 y.o. -     Returns for follow-up.  Last visit was started empiric Spiriva.  She states it does help.  Simultaneously Dr. Hurman Horn also gave her Aldactone.  She thinks both of the medicines helped and her shortness of breath is much better she feels reassured.  She believes it is in her best interest to continue Spiriva.  We did excess hypoxemia testing was normal without any desaturation.  Other issues: She did have a ER visit for pyelonephritis.  She been diagnosed with hypertension hyperlipidemia.   ECHO 01/18/23   IMPRESSIONS     1. Left ventricular ejection fraction, by estimation, is 55 to 60%. Left  ventricular ejection fraction by 3D volume is 55 %. The left ventricle has  normal function. The left ventricle has no regional wall motion  abnormalities. Left ventricular diastolic   parameters were normal. The average left ventricular global longitudinal  strain is -23.7 %. The global longitudinal strain is normal.   2. Right ventricular systolic function is normal. The right ventricular  size is normal. Tricuspid regurgitation signal is inadequate for assessing  PA pressure.   3. The mitral valve is normal in structure. Trivial mitral valve  regurgitation. No evidence of mitral stenosis.   4. The aortic valve is normal in structure. Aortic valve regurgitation is  not  visualized. No aortic stenosis is present.   5. The inferior vena cava is normal in size with greater than 50%  respiratory variability, suggesting right atrial pressure of 3 mmHg.     OV 11/12/2023  Subjective:  Patient ID: Alexandra Hood, female , DOB: November 28, 1971 , age 52 y.o. 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 Graciela Husbands # History of COVID-19 in September 2023 and subsequent dyspnea #Obesity with severe migraine multiple antimigraine medications    HPI Alexandra Hood 52 y.o. -returns for routine follow-up.  She says she is feeling well.  Her shortness of breath is under control with Spiriva and Aldactone.  She had alpha-1 antitrypsin check.  I reviewed this it was normal levels.  Last CT scan was in January 2024 and the sarcoid is burnt out the chest of emphysema.  She was a smoker in the past and do some passive smoking.  Her main current issue is the migraines.  Highly active this past week.  She did see Dr. Shon Millet last 07/09/2023.  I reviewed the external medical record.  She is also seen primary care nurse practitioner in July 2024.  Otherwise no issues.  She had pulmonary function test during this restriction because of obesity.  DLCO was normal.  Simple office walk 224 (66+46 x 2) feet Pod A at [facility] x  3 laps goal with forehead probe 05/14/2023  O2 used ra   Number laps completed Sit stand x 15   Comments about pace god   Resting Pulse Ox/HR 96% and 80/min   Final Pulse Ox/HR 94% and 107/min   Desaturated </= 88% no   Desaturated <= 3% points no   Got Tachycardic >/= 90/min yes   Symptoms at end of test 2 of 10 dyspnea    Miscellaneous comments x       PFT    Latest Ref Rng & Units 11/12/2023    1:23 PM 01/12/2023   11:02 AM 03/24/2015    1:50 PM  PFT Results  FVC-Pre L 2.07  P 2.02    FVC-Predicted Pre % 57  P 55  76   FVC-Post L 2.15  P 2.12  2.41   FVC-Predicted Post % 59  P 58  76   Pre FEV1/FVC % % 89  P 88  86   Post FEV1/FCV % % 88  P 90  87   FEV1-Pre L 1.84  P 1.79  2.07   FEV1-Predicted Pre % 64  P 62  79   FEV1-Post L 1.89  P 1.90  2.11   DLCO uncorrected ml/min/mmHg 17.75  P 20.58  17.92   DLCO UNC% % 82  P 95  69   DLCO corrected ml/min/mmHg 17.75  P 20.58    DLCO COR %Predicted % 82  P 95    DLVA Predicted % 125  P 139  110   TLC L 3.17  P 3.82  3.15   TLC % Predicted % 61  P 74  60   RV % Predicted % 52  P 84  49     P Preliminary result      LAB RESULTS last 96 hours No results found.  LAB RESULTS last 90 days Recent Results (from the past 2160 hour(s))  CUP PACEART REMOTE DEVICE CHECK     Status: None   Collection Time: 09/20/23 12:17 AM  Result Value Ref Range   Date Time Interrogation Session [id number]    Pulse Generator Manufacturer MERM    Pulse Gen Model [id number] Evera MRI XT DR    Pulse Gen Serial Number [id number]    Clinic Name [facility]    Implantable Pulse Generator Type Implantable Cardiac Defibulator    Implantable Pulse Generator Implant Date [id number]    Implantable Lead Manufacturer [facility]    Implantable Lead Model 5076 CapSureFix Novus    Implantable Lead Serial Number [id number]    Implantable Lead Implant Date 16073710    Implantable Lead Location [id number]    Implantable Lead Connection Status [id number]    Implantable Lead Manufacturer [facility]    Implantable  Lead Model [id number] Sprint Quattro Secure    Implantable Lead Serial Number [id number]    Implantable Lead Implant Date 48546270    Implantable Lead Location [id number]    Implantable Lead Connection Status [id number]    Lead Channel Setting Sensing Sensitivity 0.45 mV   Lead Channel Setting Pacing Amplitude 1.5 V   Lead Channel Setting Pacing Pulse Width 0.4 ms   Lead Channel Setting Pacing Amplitude 2 V   Zone Setting Status Active    Zone Setting Status Inactive    Zone Setting Status Inactive    Zone Setting Status [id number]    Zone Setting Status [id number]    Lead Channel Impedance Value 437 ohm   Lead Channel Sensing Intrinsic Amplitude 2 mV   Lead Channel Sensing Intrinsic Amplitude 2 mV  Lead Channel Pacing Threshold Amplitude 0.375 V   Lead Channel Pacing Threshold Pulse Width 0.4 ms   Lead Channel Impedance Value 380 ohm   Lead Channel Impedance Value 285 ohm   Lead Channel Sensing Intrinsic Amplitude 6.125 mV   Lead Channel Sensing Intrinsic Amplitude 6.125 mV   Lead Channel Pacing Threshold Amplitude 0.625 V   Lead Channel Pacing Threshold Pulse Width 0.4 ms   HighPow Impedance 48 ohm   HighPow Impedance 61 ohm   Battery Status OK    Battery Remaining Longevity 41 mo   Battery Voltage 2.90 V   Brady Statistic RA Percent Paced 0.01 %   Brady Statistic RV Percent Paced 0.03 %   Brady Statistic AP VP Percent 0 %   Brady Statistic AS VP Percent 0.03 %   Brady Statistic AP VS Percent 0.01 %   Brady Statistic AS VS Percent 99.96 %   Eval Rhythm SR at 99 bpm   Pulmonary function test     Status: None (Preliminary result)   Collection Time: 11/12/23  1:23 PM  Result Value Ref Range   FVC-Pre 2.07 L   FVC-%Pred-Pre 57 %   FVC-Post 2.15 L   FVC-%Pred-Post 59 %   FVC-%Change-Post 4 %   FEV1-Pre 1.84 L   FEV1-%Pred-Pre 64 %   FEV1-Post 1.89 L   FEV1-%Pred-Post 66 %   FEV1-%Change-Post 2 %   FEV6-Pre 2.07 L   FEV6-%Pred-Pre 58 %   FEV6-Post 2.15 L   FEV6-%Pred-Post 61 %    FEV6-%Change-Post 4 %   Pre FEV1/FVC ratio 89 %   FEV1FVC-%Pred-Pre 111 %   Post FEV1/FVC ratio 88 %   FEV1FVC-%Change-Post -1 %   Pre FEV6/FVC Ratio 100 %   FEV6FVC-%Pred-Pre 102 %   Post FEV6/FVC ratio 100 %   FEV6FVC-%Pred-Post 102 %   FEF 25-75 Pre 2.88 L/sec   FEF2575-%Pred-Pre 103 %   FEF 25-75 Post 3.11 L/sec   FEF2575-%Pred-Post 111 %   FEF2575-%Change-Post 7 %   RV 0.94 L   RV % pred 52 %   TLC 3.17 L   TLC % pred 61 %   DLCO unc 17.75 ml/min/mmHg   DLCO unc % pred 82 %   DLCO cor 17.75 ml/min/mmHg   DLCO cor % pred 82 %   DL/VA 9.56 ml/min/mmHg/L   DL/VA % pred 213 %         has a past medical history of Headache, Lupus pernio, Pertussis, Pseudotumor cerebri, Pulmonary sarcoidosis (HCC), Ventricular tachycardia (HCC), and Vitamin D deficiency.   reports that she quit smoking about 16 years ago. Her smoking use included cigarettes. She started smoking about 31 years ago. She has a 15 pack-year smoking history. She has never used smokeless tobacco.  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    No Known Allergies  Immunization History  Administered Date(s) Administered   Influenza Split 09/24/2014   Influenza Whole 09/29/2009, 09/26/2010, 09/25/2011   Influenza,inj,Quad PF,6+ Mos 10/14/2013, 11/12/2015, 09/30/2019, 01/17/2023   PFIZER(Purple  Top)SARS-COV-2 Vaccination 03/01/2020, 03/22/2020   Pneumococcal Conjugate-13 11/12/2015   Pneumococcal-Unspecified  12/25/2009   Td 02/10/2011, 03/12/2023    Family History  Problem Relation Age of Onset   Allergies Mother    Diabetes Mother    Hypertension Mother    Hyperlipidemia Mother    Migraines Mother    Hyperlipidemia Father    Hypertension Father    Diabetes Father    Stroke Father    Hyperlipidemia Sister    Hypertension Sister    Meniere's disease Sister    Diabetes Sister    COPD Maternal Grandfather    Leukemia Paternal Grandmother    Heart attack Paternal Grandfather    Colon cancer Neg Hx    Esophageal cancer Neg Hx    Rectal cancer Neg Hx    Stomach cancer Neg Hx      Current Outpatient Medications:    albuterol (VENTOLIN HFA) 108 (90 Base) MCG/ACT inhaler, Inhale 2 puffs into the lungs every 6 (six) hours as needed for wheezing or shortness of breath., Disp: 8 g, Rfl: 3   amLODipine (NORVASC) 5 MG tablet, TAKE 1 TABLET(5 MG) BY MOUTH DAILY, Disp: 90 tablet, Rfl: 1   Galcanezumab-gnlm (EMGALITY) 120 MG/ML SOSY, INJECT 120MG  UNDER THE SKIN EVERY 30 DAYS, Disp: 1 mL, Rfl: 11   ibuprofen (ADVIL) 800 MG tablet, Take 1 tablet (800 mg total) by mouth every 8 (eight) hours as needed (pain)., Disp: 21 tablet, Rfl: 0   meloxicam (MOBIC) 7.5 MG tablet, TAKE 1 TABLET(7.5 MG) BY MOUTH DAILY, Disp: 30 tablet, Rfl: 0   Multiple Vitamin (MULTIVITAMIN WITH MINERALS) TABS tablet, Take 1 tablet by mouth daily., Disp: , Rfl:    predniSONE (STERAPRED UNI-PAK 21 TAB) 10 MG (21) TBPK tablet, take 60mg  day 1, then 50mg  day 2, then 40mg  day 3, then 30mg  day 4, then 20mg  day 5, then 10mg  day 6, then STOP, Disp: 21 tablet, Rfl: 0   Rimegepant Sulfate (NURTEC) 75 MG TBDP, Take 1 tablet (75 mg total) by mouth daily as needed., Disp: 16 tablet, Rfl: 11   rosuvastatin (CRESTOR) 10 MG tablet, TAKE 1 TABLET(10 MG) BY MOUTH DAILY, Disp: 30 tablet, Rfl: 2   spironolactone (ALDACTONE) 25 MG tablet, Take 0.5 tablets (12.5 mg total) by mouth daily., Disp: 45 tablet, Rfl: 3   Tiotropium Bromide Monohydrate  (SPIRIVA RESPIMAT) 1.25 MCG/ACT AERS, Inhale 2 puffs into the lungs daily., Disp: 4 g, Rfl: 6   topiramate (TOPAMAX) 25 MG tablet, Take 1 tablet (25 mg total) by mouth at bedtime., Disp: 90 tablet, Rfl: 3   Vitamin D, Ergocalciferol, (DRISDOL) 1.25 MG (50000 UNIT) CAPS capsule, TAKE 1 CAPSULE BY MOUTH EVERY 7 DAYS, Disp: 12 capsule, Rfl: 1      Objective:   Vitals:   11/12/23 1540  BP: 125/81  Pulse: 93  SpO2: 99%  Weight: 227 lb (103 kg)  Height: 5\' 5"  (1.651 m)    Estimated body mass index is 37.77 kg/m as calculated from the following:   Height as of this encounter: 5\' 5"  (1.651 m).   Weight as of this encounter: 227 lb (103 kg).  @WEIGHTCHANGE @  Filed Weights   11/12/23 1540  Weight: 227 lb (103 kg)     Physical Exam   General: No distress. Look well. Sunglass on O2 at rest: no Cane present: no Sitting in wheel chair: no Frail: no Obese: no Neuro: Alert and Oriented x 3. GCS 15. Speech normal Psych: Pleasant Resp:  Barrel Chest - no.  Wheeze - no, Crackles - no, No overt respiratory distress CVS: Normal heart sounds. Murmurs - no Ext: Stigmata of Connective Tissue Disease - no HEENT: Normal upper airway. PEERL +. No post nasal drip        Assessment:       ICD-10-CM   1. Pulmonary emphysema, unspecified emphysema type (HCC)  J43.9     2. At risk from passive smoking  Z91.89     3. Pulmonary sarcoidosis (HCC)  D86.0          Plan:     Patient Instructions     ICD-10-CM   1. Pulmonary emphysema, unspecified emphysema type (HCC)  J43.9     2. At risk from passive smoking  Z91.89     3. Pulmonary sarcoidosis (HCC)  D86.0       Glad you are feeling better after Spiriva and Aldactone. I think a mild amount of emphysema from previous smoking and also passive smoking.     Plan  - Continue Spiriva Respimat 2 puffs once daily - have flu shot as soon as possible; deferred at your request 11/12/2023   Follow-up --Return in 12 months to see  Dr. Marchelle Gearing or sooner if needed   FOLLOWUP Return in about 1 year (around 11/11/2024) for 15 min visit, with Dr Marchelle Gearing, Face to Face Visit.    SIGNATURE    Dr. Kalman Shan, M.D., F.C.C.P,  Pulmonary and Critical Care Medicine Staff Physician, [facility] Director - Interstitial Lung Disease  Program  Pulmonary Fibrosis [facility] at [facility] [place], [place], [address]  Pager: [phone], If no answer or between  15:00h - 7:00h: call [phone] Telephone: [phone]  3:55 PM 11/12/2023

## 2023-11-12 NOTE — Patient Instructions (Signed)
Full PFT Performed Today  

## 2023-11-12 NOTE — Progress Notes (Signed)
Full PFT Performed Today  

## 2023-11-19 LAB — HM MAMMOGRAPHY

## 2023-12-02 ENCOUNTER — Other Ambulatory Visit: Payer: Self-pay | Admitting: Nurse Practitioner

## 2023-12-03 ENCOUNTER — Encounter: Payer: Self-pay | Admitting: Nurse Practitioner

## 2023-12-03 ENCOUNTER — Ambulatory Visit (INDEPENDENT_AMBULATORY_CARE_PROVIDER_SITE_OTHER): Payer: BC Managed Care – PPO | Admitting: Nurse Practitioner

## 2023-12-03 VITALS — BP 126/80 | HR 95 | Temp 97.8°F | Ht 65.0 in | Wt 228.0 lb

## 2023-12-03 DIAGNOSIS — E782 Mixed hyperlipidemia: Secondary | ICD-10-CM

## 2023-12-03 DIAGNOSIS — D860 Sarcoidosis of lung: Secondary | ICD-10-CM | POA: Diagnosis not present

## 2023-12-03 DIAGNOSIS — R7303 Prediabetes: Secondary | ICD-10-CM | POA: Diagnosis not present

## 2023-12-03 DIAGNOSIS — Z1159 Encounter for screening for other viral diseases: Secondary | ICD-10-CM

## 2023-12-03 DIAGNOSIS — Z114 Encounter for screening for human immunodeficiency virus [HIV]: Secondary | ICD-10-CM

## 2023-12-03 DIAGNOSIS — Z0000 Encounter for general adult medical examination without abnormal findings: Secondary | ICD-10-CM | POA: Diagnosis not present

## 2023-12-03 DIAGNOSIS — I10 Essential (primary) hypertension: Secondary | ICD-10-CM

## 2023-12-03 DIAGNOSIS — G43711 Chronic migraine without aura, intractable, with status migrainosus: Secondary | ICD-10-CM

## 2023-12-03 DIAGNOSIS — E559 Vitamin D deficiency, unspecified: Secondary | ICD-10-CM | POA: Diagnosis not present

## 2023-12-03 DIAGNOSIS — I429 Cardiomyopathy, unspecified: Secondary | ICD-10-CM

## 2023-12-03 LAB — LIPID PANEL
Cholesterol: 153 mg/dL (ref 0–200)
HDL: 41.7 mg/dL (ref >39.00)
LDL Cholesterol: 85 mg/dL (ref 0–99)
NonHDL: 111.16
Total CHOL/HDL Ratio: 4
Triglycerides: 132 mg/dL (ref 0.0–149.0)
VLDL: 26.4 mg/dL (ref 0.0–40.0)

## 2023-12-03 LAB — CBC WITH DIFFERENTIAL/PLATELET
Basophils Absolute: 0 10*3/uL (ref 0.0–0.1)
Basophils Relative: 0.2 % (ref 0.0–3.0)
Eosinophils Absolute: 0.1 10*3/uL (ref 0.0–0.7)
Eosinophils Relative: 1.2 % (ref 0.0–5.0)
HCT: 39.3 % (ref 36.0–46.0)
Hemoglobin: 12.8 g/dL (ref 12.0–15.0)
Lymphocytes Relative: 26.4 % (ref 12.0–46.0)
Lymphs Abs: 2.4 10*3/uL (ref 0.7–4.0)
MCHC: 32.6 g/dL (ref 30.0–36.0)
MCV: 88.7 fL (ref 78.0–100.0)
Monocytes Absolute: 0.5 10*3/uL (ref 0.1–1.0)
Monocytes Relative: 5.5 % (ref 3.0–12.0)
Neutro Abs: 6 10*3/uL (ref 1.4–7.7)
Neutrophils Relative %: 66.7 % (ref 43.0–77.0)
Platelets: 444 10*3/uL — ABNORMAL HIGH (ref 150.0–400.0)
RBC: 4.43 Mil/uL (ref 3.87–5.11)
RDW: 14.9 % (ref 11.5–15.5)
WBC: 9 10*3/uL (ref 4.0–10.5)

## 2023-12-03 LAB — COMPREHENSIVE METABOLIC PANEL
ALT: 20 U/L (ref 0–35)
AST: 15 U/L (ref 0–37)
Albumin: 4 g/dL (ref 3.5–5.2)
Alkaline Phosphatase: 86 U/L (ref 39–117)
BUN: 13 mg/dL (ref 6–23)
CO2: 28 meq/L (ref 19–32)
Calcium: 9.7 mg/dL (ref 8.4–10.5)
Chloride: 102 meq/L (ref 96–112)
Creatinine, Ser: 0.78 mg/dL (ref 0.40–1.20)
GFR: 88.55 mL/min (ref >60.00)
Glucose, Bld: 112 mg/dL — ABNORMAL HIGH (ref 70–99)
Potassium: 3.8 meq/L (ref 3.5–5.1)
Sodium: 141 meq/L (ref 135–145)
Total Bilirubin: 0.4 mg/dL (ref 0.2–1.2)
Total Protein: 7.2 g/dL (ref 6.0–8.3)

## 2023-12-03 LAB — HEMOGLOBIN A1C: Hgb A1c MFr Bld: 6.6 % — ABNORMAL HIGH (ref 4.6–6.5)

## 2023-12-03 LAB — VITAMIN D 25 HYDROXY (VIT D DEFICIENCY, FRACTURES): VITD: 34.49 ng/mL (ref 30.00–100.00)

## 2023-12-03 MED ORDER — ALBUTEROL SULFATE HFA 108 (90 BASE) MCG/ACT IN AERS: 2.0000 | INHALATION_SPRAY | Freq: Four times a day (QID) | RESPIRATORY_TRACT | 3 refills | Status: AC | PRN

## 2023-12-03 MED ORDER — VITAMIN D (ERGOCALCIFEROL) 1.25 MG (50000 UNIT) PO CAPS: 50000.0000 [IU] | ORAL_CAPSULE | ORAL | 0 refills | Status: DC

## 2023-12-03 NOTE — Assessment & Plan Note (Signed)
She has a history of vitamin D deficiency, will check levels today and adjust regimen based on results.

## 2023-12-03 NOTE — Assessment & Plan Note (Signed)
Chronic, stable.  Continue rosuvastatin 10 mg daily.  Check CMP, CBC, lipid panel today 

## 2023-12-03 NOTE — Patient Instructions (Signed)
It was great to see you!  I have refilled your medications  We are checking your labs today and will let you know the results via mychart/phone.   We recommend the shingles vaccine at age 50. You can look into this further and let us know if you are interested.   Let's follow-up in 6 months, sooner if you have concerns.  If a referral was placed today, you will be contacted for an appointment. Please note that routine referrals can sometimes take up to 3-4 weeks to process. Please call our office if you haven't heard anything after this time frame.  Take care,  Rodman Pickle, NP

## 2023-12-03 NOTE — Assessment & Plan Note (Signed)
Chronic, stable.  She is currently following with cardiology and has an ICD in place.  We will check CMP, CBC, lipid panel today 

## 2023-12-03 NOTE — Assessment & Plan Note (Signed)
Health maintenance reviewed and updated. Discussed nutrition, exercise. Check CMP, CBC today. Follow-up 1 year.   

## 2023-12-03 NOTE — Assessment & Plan Note (Signed)
Chronic, stable.  Continue amlodipine 5 mg daily and spironolactone 12.5 mg daily.  Follow-up in 6 months.

## 2023-12-03 NOTE — Assessment & Plan Note (Signed)
She is currently following with Dr. Chase Caller for this. Continue collaborations and recommendations from pulmonology

## 2023-12-03 NOTE — Telephone Encounter (Signed)
Requesting: MELOXICAM 7.5MG  TABLETS  Last Visit: 07/18/2023 Next Visit: 12/03/2023 06/02/2024 Last Refill: 11/05/2023  Please Advise

## 2023-12-03 NOTE — Assessment & Plan Note (Signed)
Chronic, stable. Check A1c today and treat based on results.

## 2023-12-03 NOTE — Assessment & Plan Note (Signed)
Chronic, stable.  She is currently following with neurology.  She is taking Emgality, Topamax 25 mg at bedtime, and Nurtec as needed.  She currently gets about 3 migraines per month.  Continue recommendations in collaboration with neurology.

## 2023-12-03 NOTE — Progress Notes (Signed)
BP 126/80 (BP Location: Left Arm)   Pulse 95   Temp 97.8 F (36.6 C)   Ht 5\' 5"  (1.651 m)   Wt 228 lb (103.4 kg)   LMP 12/25/2006   SpO2 99%   BMI 37.94 kg/m    Subjective:    Patient ID: Journei Dumas-Hightower, female    DOB: April 23, 1971, 52 y.o.   MRN: [id number]  CC: Chief Complaint  Patient presents with   Annual Exam    With fasting lab work, no concerns    HPI: Ailea Dumas-Hightower is a 52 y.o. female presenting on 12/03/2023 for comprehensive medical examination. Current medical complaints include:none  She currently lives with: daughter Menopausal Symptoms: no  Depression and Anxiety Screen done today and results listed below:     12/03/2023    9:38 AM 01/17/2023    8:02 AM 10/20/2022   11:00 AM 06/02/2020    4:22 PM 04/16/2020    3:13 PM  Depression screen PHQ 2/9  Decreased Interest 0 0 0 0 0  Down, Depressed, Hopeless 0 0 0 0 0  PHQ - 2 Score 0 0 0 0 0  Altered sleeping 0  0    Tired, decreased energy 1  0    Change in appetite 0  0    Feeling bad or failure about yourself  0  0    Trouble concentrating 0  0    Moving slowly or fidgety/restless 0  0    Suicidal thoughts 0  0    PHQ-9 Score 1  0    Difficult doing work/chores Not difficult at all          12/03/2023    9:38 AM 10/20/2022   11:00 AM 03/25/2020   10:04 AM 03/12/2020    4:05 PM  GAD 7 : Generalized Anxiety Score  Nervous, Anxious, on Edge 0 0 0 0  Control/stop worrying 0 0 0 0  Worry too much - different things 0 0 0 0  Trouble relaxing 0 0 0 0  Restless 0 0 0 0  Easily annoyed or irritable 0 0 0 0  Afraid - awful might happen 0 0 0 0  Total GAD 7 Score 0 0 0 0  Anxiety Difficulty   Not difficult at all Not difficult at all    The patient does not have a history of falls. I did not complete a risk assessment for falls. A plan of care for falls was not documented.   Past Medical History:  Past Medical History:  Diagnosis Date   Headache    Lupus pernio    -skin bx confirmed at  [facility] 05/07/2009 (personally reviewed) -looks improved 07/09/2009 -   Pertussis    Pseudotumor cerebri    .Marland KitchenMarland KitchenDr Hyacinth Meeker and Dr. Dione Booze - dxed Jan 2009, on diamox  - LP 01/24/2008 at [facility] - Normal ( pressure 14cm, gluc 68, Protein 23, Clear, colorless, RBC 3, WBC - too few to count)  - CT head non contrast done at [facility] 11/25/2007: "normal". Ddoes not report MRI, MRA, MRV being done - 2nd opinon by Dr. Pearlean Brownie - reportdly had MRI/MRV both normal; followup pending   Pulmonary sarcoidosis (HCC)    - Cleda Daub 07/25/2010 on pred 10/mtx 10 -> FEv1 1.84L/775%, FVC 2.25L - SPiro 09/06/2010 -> Fev1 2.14L/81%. Cut pred to 5mg  per day. Cont mtx - SPiro 01/20/2011 - Fev1 1.6L/61%, Ratio 74%. CXR Worse -> increased pred to 10mg  perday. Cont Mtx.  Ventricular tachycardia (HCC)    Vitamin D deficiency    - Aug 2011: 22 and low -> commence Rx. 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   meloxicam (MOBIC) 7.5 MG tablet TAKE 1 TABLET(7.5 MG) BY MOUTH DAILY   Multiple Vitamin (MULTIVITAMIN WITH MINERALS)  TABS tablet Take 1 tablet by mouth daily.   Rimegepant Sulfate (NURTEC) 75 MG TBDP Take 1 tablet (75 mg total) by mouth daily as needed.   rosuvastatin (CRESTOR) 10 MG tablet TAKE 1 TABLET(10 MG) BY MOUTH DAILY   spironolactone (ALDACTONE) 25 MG tablet Take 0.5 tablets (12.5 mg total) by mouth daily.   Tiotropium Bromide Monohydrate (SPIRIVA RESPIMAT) 1.25 MCG/ACT AERS Inhale 2 puffs into the lungs daily.   topiramate (TOPAMAX) 25 MG tablet Take 1 tablet (25 mg total) by mouth at bedtime.   No current facility-administered medications on file prior to visit.    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)  Tobacco Use   Smoking status: Former    Current packs/day: 0.00    Average packs/day: 1 pack/day for 15.0 years (15.0 ttl pk-yrs)    Types: Cigarettes    Start date: 03/02/1992    Quit date: 03/03/2007    Years since quitting: 16.7   Smokeless tobacco: Never  Vaping Use   Vaping status: Never Used  Substance and Sexual Activity   Alcohol use: No    Alcohol/week: 0.0 standard drinks of alcohol   Drug use: No   Sexual activity: Not Currently    Comment: bilateral oophorectomy  Other Topics Concern   Not on file  Social History Narrative   Right handed   Drinks caffeine   Lives alone   Two floor home   Social Determinants of Health   Financial Resource Strain: Low Risk  (11/30/2023)   Overall Financial Resource Strain (CARDIA)    Difficulty of Paying Living Expenses: Not hard at all  Food Insecurity: No Food Insecurity (11/30/2023)   Hunger Vital Sign    Worried About Running Out of Food in the Last Year: Never true    Ran Out of Food in the Last Year: Never  true  Transportation Needs: No Transportation Needs (11/30/2023)   PRAPARE - Transportation  Lack of Transportation (Medical): No    Lack of Transportation (Non-Medical): No  Physical Activity: Insufficiently Active (11/30/2023)   Exercise Vital Sign    Days of Exercise per Week: 3 days    Minutes of Exercise per Session: 10 min  Stress: No Stress Concern Present (11/30/2023)   [employer] of Occupational Health - Occupational Stress Questionnaire    Feeling of Stress : Not at all  Social Connections: Moderately Isolated (11/30/2023)   Social Connection and Isolation Panel [NHANES]    Frequency of Communication with Friends and Family: Once a week    Frequency of Social Gatherings with Friends and Family: More than three times a week    Attends Religious Services: More than 4 times per year    Active Member of [employer] or Organizations: No    Attends Engineer, structural: Not on file    Marital Status: Separated  Intimate Partner Violence: Not on file   Social History   Tobacco Use  Smoking Status Former   Current packs/day: 0.00   Average packs/day: 1 pack/day for 15.0 years (15.0 ttl pk-yrs)   Types: Cigarettes   Start date: 03/02/1992   Quit date: 03/03/2007   Years since quitting: 16.7  Smokeless Tobacco Never   Social History   Substance and Sexual Activity  Alcohol Use No   Alcohol/week: 0.0 standard drinks of alcohol    Family History:  Family History  Problem Relation Age of Onset   Allergies Mother    Diabetes Mother    Hypertension Mother    Hyperlipidemia Mother    Migraines Mother    Hyperlipidemia Father    Hypertension Father    Diabetes Father    Stroke Father    Hyperlipidemia Sister    Hypertension Sister    Meniere's disease Sister    Diabetes Sister    COPD Maternal Grandfather    Leukemia Paternal Grandmother    Heart attack Paternal Grandfather    Colon cancer Neg Hx    Esophageal cancer Neg Hx    Rectal cancer Neg Hx     Stomach cancer Neg Hx     Past medical history, surgical history, medications, allergies, family history and social history reviewed with patient today and changes made to appropriate areas of the chart.   Review of Systems  Constitutional: Negative.   HENT: Negative.    Eyes: Negative.   Respiratory:  Positive for shortness of breath (with exertion).   Cardiovascular: Negative.   Gastrointestinal: Negative.   Genitourinary: Negative.   Musculoskeletal: Negative.   Skin: Negative.   Neurological: Negative.   Psychiatric/Behavioral: Negative.     All other ROS negative except what is listed above and in the HPI.      Objective:    BP 126/80 (BP Location: Left Arm)   Pulse 95   Temp 97.8 F (36.6 C)   Ht 5\' 5"  (1.651 m)   Wt 228 lb (103.4 kg)   LMP 12/25/2006   SpO2 99%   BMI 37.94 kg/m   Wt Readings from Last 3 Encounters:  12/03/23 228 lb (103.4 kg)  11/12/23 227 lb (103 kg)  11/12/23 227 lb 6.4 oz (103.1 kg)    Physical Exam Vitals and nursing note reviewed.  Constitutional:      General: She is not in acute distress.    Appearance: Normal appearance.  HENT:     Head: Normocephalic and atraumatic.     Right Ear: Tympanic membrane, ear canal and external ear  normal.     Left Ear: Tympanic membrane, ear canal and external ear normal.     Mouth/Throat:     Mouth: Mucous membranes are moist.     Pharynx: No posterior oropharyngeal erythema.  Eyes:     Conjunctiva/sclera: Conjunctivae normal.  Cardiovascular:     Rate and Rhythm: Normal rate and regular rhythm.     Pulses: Normal pulses.     Heart sounds: Normal heart sounds.  Pulmonary:     Effort: Pulmonary effort is normal.     Breath sounds: Normal breath sounds.  Abdominal:     Palpations: Abdomen is soft.     Tenderness: There is no abdominal tenderness.  Musculoskeletal:        General: Normal range of motion.     Cervical back: Normal range of motion and neck supple.     Right lower leg: No edema.      Left lower leg: No edema.  Lymphadenopathy:     Cervical: No cervical adenopathy.  Skin:    General: Skin is warm and dry.  Neurological:     General: No focal deficit present.     Mental Status: She is alert and oriented to person, place, and time.     Cranial Nerves: No cranial nerve deficit.     Coordination: Coordination normal.     Gait: Gait normal.  Psychiatric:        Mood and Affect: Mood normal.        Behavior: Behavior normal.        Thought Content: Thought content normal.        Judgment: Judgment normal.     Results for orders placed or performed in visit on 11/12/23  Pulmonary function test  Result Value Ref Range   FVC-Pre 2.07 L   FVC-%Pred-Pre 57 %   FVC-Post 2.15 L   FVC-%Pred-Post 59 %   FVC-%Change-Post 4 %   FEV1-Pre 1.84 L   FEV1-%Pred-Pre 64 %   FEV1-Post 1.89 L   FEV1-%Pred-Post 66 %   FEV1-%Change-Post 2 %   FEV6-Pre 2.07 L   FEV6-%Pred-Pre 58 %   FEV6-Post 2.15 L   FEV6-%Pred-Post 61 %   FEV6-%Change-Post 4 %   Pre FEV1/FVC ratio 89 %   FEV1FVC-%Pred-Pre 111 %   Post FEV1/FVC ratio 88 %   FEV1FVC-%Change-Post -1 %   Pre FEV6/FVC Ratio 100 %   FEV6FVC-%Pred-Pre 102 %   Post FEV6/FVC ratio 100 %   FEV6FVC-%Pred-Post 102 %   FEF 25-75 Pre 2.88 L/sec   FEF2575-%Pred-Pre 103 %   FEF 25-75 Post 3.11 L/sec   FEF2575-%Pred-Post 111 %   FEF2575-%Change-Post 7 %   RV 0.94 L   RV % pred 52 %   TLC 3.17 L   TLC % pred 61 %   DLCO unc 17.75 ml/min/mmHg   DLCO unc % pred 82 %   DLCO cor 17.75 ml/min/mmHg   DLCO cor % pred 82 %   DL/VA 1.19 ml/min/mmHg/L   DL/VA % pred 147 %      Assessment & Plan:   Problem List Items Addressed This Visit       Cardiovascular and Mediastinum   Cardiomyopathy, secondary-sarcoid    Chronic, stable.  She is currently following with cardiology and has an ICD in place.  We will check CMP, CBC, lipid panel today      Migraine    Chronic, stable.  She is currently following with neurology.  She is  taking Merchant navy officer,  Topamax 25 mg at bedtime, and Nurtec as needed.  She currently gets about 3 migraines per month.  Continue recommendations in collaboration with neurology.      Primary hypertension    Chronic, stable.  Continue amlodipine 5 mg daily and spironolactone 12.5 mg daily.  Follow-up in 6 months.      Relevant Orders   CBC with Differential/Platelet   Comprehensive metabolic panel     Respiratory   Pulmonary sarcoidosis (HCC)    She is currently following with Dr. Marchelle Gearing for this. Continue collaborations and recommendations from pulmonology         Other   Vitamin D deficiency    She has a history of vitamin D deficiency, will check levels today and adjust regimen based on results.       Relevant Orders   VITAMIN D 25 Hydroxy (Vit-D Deficiency, Fractures)   Pre-diabetes    Chronic, stable. Check A1c today and treat based on results.       Relevant Orders   Hemoglobin A1c   Pure hypercholesterolemia    Chronic, stable. Continue rosuvastatin 10mg  daily. Check CMP, CBC, lipid panel today.       Routine general medical examination at a health care facility - Primary    Health maintenance reviewed and updated. Discussed nutrition, exercise. Check CMP, CBC today. Follow-up 1 year.        Other Visit Diagnoses     Screening for HIV (human immunodeficiency virus)       Screen HIV today   Relevant Orders   HIV Antibody (routine testing w rflx)   Encounter for hepatitis C screening test for low risk patient       Screen hepatitis C today   Relevant Orders   Hepatitis C antibody        Follow up plan: Return in about 6 months (around 06/02/2024) for Chronic disease management .   LABORATORY TESTING:  - Pap smear: pap done  IMMUNIZATIONS:   - Tdap: Tetanus vaccination status reviewed: last tetanus booster within 10 years. - Influenza:  declined today - Pneumovax: Not applicable - Prevnar: Not applicable - HPV: Not applicable - Shingrix vaccine:   declined today  SCREENING: -Mammogram: Done elsewhere  - Colonoscopy: Up to date  - Bone Density: Not applicable   PATIENT COUNSELING:   Advised to take 1 mg of folate supplement per day if capable of pregnancy.   Sexuality: Discussed sexually transmitted diseases, partner selection, use of condoms, avoidance of unintended pregnancy  and contraceptive alternatives.   Advised to avoid cigarette smoking.  I discussed with the patient that most people either abstain from alcohol or drink within safe limits (<=14/week and <=4 drinks/occasion for males, <=7/weeks and <= 3 drinks/occasion for females) and that the risk for alcohol disorders and other health effects rises proportionally with the number of drinks per week and how often a drinker exceeds daily limits.  Discussed cessation/primary prevention of drug use and availability of treatment for abuse.   Diet: Encouraged to adjust caloric intake to maintain  or achieve ideal body weight, to reduce intake of dietary saturated fat and total fat, to limit sodium intake by avoiding high sodium foods and not adding table salt, and to maintain adequate dietary potassium and calcium preferably from fresh fruits, vegetables, and low-fat dairy products.    stressed the importance of regular exercise  Injury prevention: Discussed safety belts, safety helmets, smoke detector, smoking near bedding or upholstery.   Dental health: Discussed importance of  regular tooth brushing, flossing, and dental visits.    NEXT PREVENTATIVE PHYSICAL DUE IN 1 YEAR. Return in about 6 months (around 06/02/2024) for Chronic disease management .  Jahmia Berrett A Daymion Nazaire

## 2023-12-04 LAB — HIV ANTIBODY (ROUTINE TESTING W REFLEX): HIV 1&2 Ab, 4th Generation: NONREACTIVE

## 2023-12-04 LAB — HEPATITIS C ANTIBODY: Hepatitis C Ab: NONREACTIVE

## 2023-12-20 ENCOUNTER — Ambulatory Visit (INDEPENDENT_AMBULATORY_CARE_PROVIDER_SITE_OTHER): Payer: BC Managed Care – PPO

## 2023-12-20 DIAGNOSIS — I429 Cardiomyopathy, unspecified: Secondary | ICD-10-CM | POA: Diagnosis not present

## 2023-12-21 LAB — CUP PACEART REMOTE DEVICE CHECK
Battery Remaining Longevity: 36 mo
Battery Voltage: 2.89 V
Brady Statistic AP VP Percent: 0 %
Brady Statistic AP VS Percent: 0.01 %
Brady Statistic AS VP Percent: 0.03 %
Brady Statistic AS VS Percent: 99.95 %
Brady Statistic RA Percent Paced: 0.02 %
Brady Statistic RV Percent Paced: 0.03 %
Date Time Interrogation Session: 20241226153527
HighPow Impedance: 50 Ohm
HighPow Impedance: 65 Ohm
Implantable Lead Connection Status: 753985
Implantable Lead Connection Status: 753985
Implantable Lead Implant Date: 20120308
Implantable Lead Implant Date: 20120308
Implantable Lead Location: 753859
Implantable Lead Location: 753860
Implantable Lead Model: 5076
Implantable Lead Model: 6947
Implantable Pulse Generator Implant Date: 20190913
Lead Channel Impedance Value: 323 Ohm
Lead Channel Impedance Value: 380 Ohm
Lead Channel Impedance Value: 456 Ohm
Lead Channel Pacing Threshold Amplitude: 0.375 V
Lead Channel Pacing Threshold Amplitude: 0.625 V
Lead Channel Pacing Threshold Pulse Width: 0.4 ms
Lead Channel Pacing Threshold Pulse Width: 0.4 ms
Lead Channel Sensing Intrinsic Amplitude: 2.5 mV
Lead Channel Sensing Intrinsic Amplitude: 2.5 mV
Lead Channel Sensing Intrinsic Amplitude: 6 mV
Lead Channel Sensing Intrinsic Amplitude: 6 mV
Lead Channel Setting Pacing Amplitude: 1.5 V
Lead Channel Setting Pacing Amplitude: 2 V
Lead Channel Setting Pacing Pulse Width: 0.4 ms
Lead Channel Setting Sensing Sensitivity: 0.45 mV
Zone Setting Status: 755011
Zone Setting Status: 755011

## 2023-12-31 ENCOUNTER — Encounter: Payer: Self-pay | Admitting: Internal Medicine

## 2023-12-31 ENCOUNTER — Ambulatory Visit: Payer: 59 | Attending: Internal Medicine | Admitting: Internal Medicine

## 2023-12-31 VITALS — BP 126/82 | HR 98 | Ht 65.0 in | Wt 227.4 lb

## 2023-12-31 DIAGNOSIS — Z79899 Other long term (current) drug therapy: Secondary | ICD-10-CM

## 2023-12-31 DIAGNOSIS — R000 Tachycardia, unspecified: Secondary | ICD-10-CM

## 2023-12-31 DIAGNOSIS — I429 Cardiomyopathy, unspecified: Secondary | ICD-10-CM | POA: Diagnosis not present

## 2023-12-31 DIAGNOSIS — E039 Hypothyroidism, unspecified: Secondary | ICD-10-CM

## 2023-12-31 DIAGNOSIS — I4729 Other ventricular tachycardia: Secondary | ICD-10-CM | POA: Diagnosis not present

## 2023-12-31 DIAGNOSIS — R5383 Other fatigue: Secondary | ICD-10-CM

## 2023-12-31 DIAGNOSIS — R072 Precordial pain: Secondary | ICD-10-CM

## 2023-12-31 DIAGNOSIS — Z95810 Presence of automatic (implantable) cardiac defibrillator: Secondary | ICD-10-CM

## 2023-12-31 MED ORDER — METOPROLOL TARTRATE 100 MG PO TABS: ORAL_TABLET | ORAL | 3 refills | Status: DC

## 2023-12-31 NOTE — Progress Notes (Signed)
 Patient Care Team: Nedra Tinnie LABOR, NP as PCP - General (Internal Medicine) Fernande Elspeth BROCKS, MD as Consulting Physician (Cardiology) Skeet Juliene SAUNDERS, DO as Consulting Physician (Neurology)   HPI  Alexandra Hood is a 53 y.o. female Is seen in followup for ICD implanted 3/12 for ventricular tachycardia noted on the monitor in the setting of known pulmonary sarcoid and MRI confirmed cardiac sarcoid.  Gen replacement 9/19   Negative sleep study a few years ago  The patient denies chest pain, shortness of breath, nocturnal dyspne, orthopnea or peripheral edema.  There have been no palpitations, lightheadedness or syncope.  Complains of fatigue over the last 4-6 weeks.  No change in stool color, no diaphoresis or palpitations, no indolent or recent infections..  Used to wear sunglasses because of pseudotumor cerebri; headaches are better  DATE TEST EF   2/12 cMRI 50% +LGE  1/24 Echo  55-60%         Date Cr K Hgb  10/23 0.67 4.1 13.0  12/24 0.72 3.8 12.8         Past Medical History:  Diagnosis Date   Headache    Lupus pernio    -skin bx confirmed at [facility] 05/07/2009 (personally reviewed) -looks improved 07/09/2009 -   Pertussis    Pseudotumor cerebri    .SABRASABRADr Cleotilde and Dr. Octavia - dxed Jan 2009, on diamox   - LP 01/24/2008 at [facility] - Normal ( pressure 14cm, gluc 68, Protein 23, Clear, colorless, RBC 3, WBC - too few to count)  - CT head non contrast done at [facility] 11/25/2007: normal. Ddoes not report MRI, MRA, MRV being done - 2nd opinon by Dr. Rosemarie - reportdly had MRI/MRV both normal; followup pending   Pulmonary sarcoidosis (HCC)    - Spiro 07/25/2010 on pred 10/mtx 10 -> FEv1 1.84L/775%, FVC 2.25L - SPiro 09/06/2010 -> Fev1 2.14L/81%. Cut pred to 5mg  per day. Cont mtx - SPiro 01/20/2011 - Fev1 1.6L/61%, Ratio 74%. CXR Worse -> increased pred to 10mg  perday. Cont Mtx.     Ventricular tachycardia (HCC)    Vitamin D  deficiency    - Aug 2011: 22  and low -> commence Rx. 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 8 g 3   amLODipine  (NORVASC ) 5 MG tablet TAKE 1 TABLET(5 MG) BY MOUTH DAILY 90 tablet 1   Galcanezumab -gnlm (EMGALITY ) 120 MG/ML SOSY INJECT 120MG  UNDER THE SKIN EVERY 30 DAYS 1 mL 11   ibuprofen  (ADVIL ) 800 MG tablet Take 1 tablet (800 mg total) by mouth every 8 (eight) hours as needed (pain). 21 tablet 0   meloxicam  (MOBIC ) 7.5 MG tablet TAKE 1 TABLET(7.5 MG) BY MOUTH DAILY (Patient taking differently: prn) 30 tablet 0  Multiple Vitamin (MULTIVITAMIN WITH MINERALS) TABS tablet Take 1 tablet by mouth daily.     Rimegepant Sulfate (NURTEC) 75 MG TBDP Take 1 tablet (75 mg total) by mouth daily as needed. 16 tablet 11   rosuvastatin  (CRESTOR ) 10 MG tablet TAKE 1 TABLET(10 MG) BY MOUTH DAILY 30 tablet 2   spironolactone  (ALDACTONE ) 25 MG tablet Take 0.5 tablets (12.5 mg total) by mouth daily. 45 tablet 3   Tiotropium Bromide  Monohydrate (SPIRIVA  RESPIMAT) 1.25 MCG/ACT AERS Inhale 2 puffs into the lungs daily. 4 g 6   topiramate  (TOPAMAX ) 25 MG tablet Take 1 tablet (25 mg total) by mouth at bedtime. 90 tablet 3   Vitamin D , Ergocalciferol , (DRISDOL ) 1.25 MG (50000 UNIT) CAPS capsule Take 1 capsule  (50,000 Units total) by mouth every 7 (seven) days. 12 capsule 0   No current facility-administered medications for this visit.    No Known Allergies  Review of Systems negative except from HPI and PMH  Physical Exam BP 126/82   Pulse 98   Ht 5' 5 (1.651 m)   Wt 227 lb 6.4 oz (103.1 kg)   LMP 12/25/2006   SpO2 97%   BMI 37.84 kg/m   Well developed and well nourished in no acute distress HENT normal Neck supple with JVP-flat Clear Device pocket well healed; without hematoma or erythema.  There is no tethering  Regular rate and rhythm, no  gallop No  murmur Abd-soft with active BS No Clubbing cyanosis  edema Skin-warm and dry A & Oriented  Grossly normal sensory and motor function   ECG sinus # 98 15/07/34  Device function is normal. Programming changes none  See Paceart for details    Device function is normal.Programming changes  none  See Paceart for details    Assessment and  Plan   Ventricular tachycardia   Elevated sinus rate  Coronary artery calcification  Implantable defibrillator-Medtronic   Cardiomyopathy-sarcoid  Pulmonary sarcoid   Dypsnea on exertion   Obese  Fatigue  Relatively abrupt onset of fatigue about 6-8 weeks ago such that she is feels like she cannot rest and wakes up not rested despite sleeping.  Interestingly, on interrogation of her device there is a change in her daytime and nocturnal heart rates coincidental with the same onset of symptoms suggesting that there is some underlying systemic process giving rise now into the fatigue but also to the objective change in her heart rate.  No change in stool.  Will check hemoglobin not withstanding.  Last thyroid was 2021 we will recheck that we will also check a cortisol.  CRP and sed rate to exclude systemic inflammatory conditions.  With current artery calcification noted on chest CT, will take CTA

## 2023-12-31 NOTE — Patient Instructions (Addendum)
 Medication Instructions:  Your physician recommends that you continue on your current medications as directed. Please refer to the Current Medication list given to you today.  *If you need a refill on your cardiac medications before your next appointment, please call your pharmacy*   Lab Work: CBC, TSH and Cortisol, CRP and Sed rate If you have labs (blood work) drawn today and your tests are completely normal, you will receive your results only by: MyChart Message (if you have MyChart) OR A paper copy in the mail If you have any lab test that is abnormal or we need to change your treatment, we will call you to review the results.   Testing/Procedures: Your physician has requested that you have cardiac CT. Cardiac computed tomography (CT) is a painless test that uses an x-ray machine to take clear, detailed pictures of your heart. For further information please visit [email or web address] Please follow instruction sheet as given.       Follow-Up: At [facility], you and your health needs are our priority.  As part of our continuing mission to provide you with exceptional heart care, we have created designated Provider Care Teams.  These Care Teams include your primary Cardiologist (physician) and Advanced Practice Providers (APPs -  Physician Assistants and Nurse Practitioners) who all work together to provide you with the care you need, when you need it.  We recommend signing up for the patient portal called MyChart.  Sign up information is provided on this After Visit Summary.  MyChart is used to connect with patients for Virtual Visits (Telemedicine).  Patients are able to view lab/test results, encounter notes, upcoming appointments, etc.  Non-urgent messages can be sent to your provider as well.   To learn more about what you can do with MyChart, go to [email or web address].    Your next appointment:   12 months    Your cardiac CT will be scheduled at one of the  below locations:   [facility] [address] [place], [place] [address] [phone]     If scheduled at [facility], please arrive at the [facility] and Children's Entrance (Entrance C2) of [facility] 30 minutes prior to test start time. You can use the FREE valet parking offered at entrance C (encouraged to control the heart rate for the test)  Proceed to the [facility] Radiology Department (first floor) to check-in and test prep.  All radiology patients and guests should use entrance C2 at [facility], accessed from [facility], even though the hospital's physical address listed is [address].    If scheduled at [facility] or [facility], please arrive 15 mins early for check-in and test prep.  There is spacious parking and easy access to the radiology department from the [facility] entrance. Please enter here and check-in with the desk attendant.   Please follow these instructions carefully (unless otherwise directed):  An IV will be required for this test and Nitroglycerin  will be given.  Hold all erectile dysfunction medications at least 3 days (72 hrs) prior to test. (Ie viagra, cialis, sildenafil, tadalafil, etc)   On the Night Before the Test: Be sure to Drink plenty of water. Do not consume any caffeinated/decaffeinated beverages or chocolate 12 hours prior to your test. Do not take any antihistamines 12 hours prior to your test.   On the Day of the Test: Drink plenty of water until 1 hour prior  to the test. Do not eat any food 1 hour prior to test. You may take your regular medications prior to the test.  Take metoprolol  (Lopressor ) two hours prior to test. If you take Furosemide/Hydrochlorothiazide/Spironolactone /Chlorthalidone, please HOLD on the morning of the test. Patients who wear a continuous glucose monitor MUST remove the device prior to  scanning. FEMALES- please wear underwire-free bra if available, avoid dresses & tight clothing              Metoprolol  tartrate 100 mg PO 90-120 minutes prior to scan       After the Test: Drink plenty of water. After receiving IV contrast, you may experience a mild flushed feeling. This is normal. On occasion, you may experience a mild rash up to 24 hours after the test. This is not dangerous. If this occurs, you can take Benadryl  25 mg and increase your fluid intake. If you experience trouble breathing, this can be serious. If it is severe call 911 IMMEDIATELY. If it is mild, please call our office.  We will call to schedule your test 2-4 weeks out understanding that some insurance companies will need an authorization prior to the service being performed.   For more information and frequently asked questions, please visit our website : [email or web address]  For non-scheduling related questions, please contact the cardiac imaging nurse navigator should you have any questions/concerns: Cardiac Imaging Nurse Navigators Direct Office Dial: [phone]   For scheduling needs, including cancellations and rescheduling, please call Brittany, [phone].

## 2024-01-01 LAB — CBC
Hematocrit: 41.9 % (ref 34.0–46.6)
Hemoglobin: 13.4 g/dL (ref 11.1–15.9)
MCH: 28.5 pg (ref 26.6–33.0)
MCHC: 32 g/dL (ref 31.5–35.7)
MCV: 89 fL (ref 79–97)
Platelets: 406 10*3/uL (ref 150–450)
RBC: 4.7 x10E6/uL (ref 3.77–5.28)
RDW: 12.9 % (ref 11.7–15.4)
WBC: 12 10*3/uL — ABNORMAL HIGH (ref 3.4–10.8)

## 2024-01-01 LAB — C-REACTIVE PROTEIN: CRP: 5 mg/L (ref 0–10)

## 2024-01-01 LAB — SEDIMENTATION RATE: Sed Rate: 40 mm/h (ref 0–40)

## 2024-01-01 LAB — CORTISOL: Cortisol: 5.4 ug/dL — ABNORMAL LOW (ref 6.2–19.4)

## 2024-01-01 LAB — TSH: TSH: 1.59 u[IU]/mL (ref 0.450–4.500)

## 2024-01-04 ENCOUNTER — Other Ambulatory Visit: Payer: Self-pay | Admitting: Nurse Practitioner

## 2024-01-04 NOTE — Telephone Encounter (Signed)
 Requesting: MELOXICAM 7.5MG  TABLETS  Last Visit: 12/03/2023 Next Visit: 06/02/2024 Last Refill: 12/03/2023  Please Advise

## 2024-01-05 LAB — CUP PACEART INCLINIC DEVICE CHECK
Date Time Interrogation Session: 20250106151938
Implantable Lead Connection Status: 753985
Implantable Lead Connection Status: 753985
Implantable Lead Implant Date: 20120308
Implantable Lead Implant Date: 20120308
Implantable Lead Location: 753859
Implantable Lead Location: 753860
Implantable Lead Model: 5076
Implantable Lead Model: 6947
Implantable Pulse Generator Implant Date: 20190913

## 2024-01-10 ENCOUNTER — Encounter (HOSPITAL_COMMUNITY): Payer: Self-pay

## 2024-01-14 ENCOUNTER — Ambulatory Visit (HOSPITAL_COMMUNITY)
Admission: RE | Admit: 2024-01-14 | Discharge: 2024-01-14 | Disposition: A | Discharge status: Home / Self Care | Payer: 59 | Source: Ambulatory Visit | Attending: Internal Medicine | Admitting: Internal Medicine

## 2024-01-14 DIAGNOSIS — R072 Precordial pain: Secondary | ICD-10-CM | POA: Insufficient documentation

## 2024-01-14 MED ORDER — IOHEXOL 350 MG/ML SOLN
95.0000 mL | Freq: Once | INTRAVENOUS | Status: AC | PRN
  Administered 2024-01-14: 95 mL via INTRAVENOUS

## 2024-01-14 MED ORDER — NITROGLYCERIN 0.4 MG SL SUBL
SUBLINGUAL_TABLET | SUBLINGUAL | Status: AC
  Filled 2024-01-14: qty 2

## 2024-01-14 MED ORDER — NITROGLYCERIN 0.4 MG SL SUBL
0.8000 mg | SUBLINGUAL_TABLET | Freq: Once | SUBLINGUAL | Status: AC
Start: 2024-01-14 — End: 2024-01-14
  Administered 2024-01-14: 0.8 mg via SUBLINGUAL

## 2024-01-23 ENCOUNTER — Encounter: Payer: Self-pay | Admitting: Internal Medicine

## 2024-01-25 ENCOUNTER — Encounter: Payer: BC Managed Care – PPO | Admitting: Nurse Practitioner

## 2024-01-31 ENCOUNTER — Other Ambulatory Visit: Payer: Self-pay | Admitting: Internal Medicine

## 2024-02-03 ENCOUNTER — Other Ambulatory Visit: Payer: Self-pay | Admitting: Nurse Practitioner

## 2024-02-05 NOTE — Telephone Encounter (Signed)
Requesting: ROSUVASTATIN 10MG  TABLETS  Last Visit: 12/03/2023 Next Visit: 06/02/2024 Last Refill: 11/05/23  Please Advise

## 2024-02-06 ENCOUNTER — Telehealth: Payer: 59 | Admitting: Physician Assistant

## 2024-02-06 ENCOUNTER — Ambulatory Visit: Payer: 59 | Admitting: Medical

## 2024-02-06 DIAGNOSIS — R399 Unspecified symptoms and signs involving the genitourinary system: Secondary | ICD-10-CM

## 2024-02-06 MED ORDER — CEPHALEXIN 500 MG PO CAPS: 500.0000 mg | ORAL_CAPSULE | Freq: Two times a day (BID) | ORAL | 0 refills | Status: DC

## 2024-02-06 MED ORDER — PHENAZOPYRIDINE HCL 100 MG PO TABS: 100.0000 mg | ORAL_TABLET | Freq: Three times a day (TID) | ORAL | 0 refills | Status: DC | PRN

## 2024-02-06 NOTE — Addendum Note (Signed)
Addended by: Margaretann Loveless on: 02/06/2024 10:21 AM   Modules accepted: Orders

## 2024-02-06 NOTE — Progress Notes (Signed)

## 2024-02-07 ENCOUNTER — Other Ambulatory Visit: Payer: Self-pay

## 2024-02-07 ENCOUNTER — Other Ambulatory Visit: Payer: Self-pay | Admitting: Nurse Practitioner

## 2024-02-07 ENCOUNTER — Telehealth: Payer: Self-pay | Admitting: Nurse Practitioner

## 2024-02-07 DIAGNOSIS — I472 Ventricular tachycardia, unspecified: Secondary | ICD-10-CM

## 2024-02-07 DIAGNOSIS — R5383 Other fatigue: Secondary | ICD-10-CM

## 2024-02-07 DIAGNOSIS — I429 Cardiomyopathy, unspecified: Secondary | ICD-10-CM

## 2024-02-07 DIAGNOSIS — D860 Sarcoidosis of lung: Secondary | ICD-10-CM

## 2024-02-07 DIAGNOSIS — I4729 Other ventricular tachycardia: Secondary | ICD-10-CM

## 2024-02-07 DIAGNOSIS — E7800 Pure hypercholesterolemia, unspecified: Secondary | ICD-10-CM

## 2024-02-07 DIAGNOSIS — R7989 Other specified abnormal findings of blood chemistry: Secondary | ICD-10-CM

## 2024-02-07 DIAGNOSIS — Z79899 Other long term (current) drug therapy: Secondary | ICD-10-CM

## 2024-02-07 NOTE — Telephone Encounter (Signed)
Received the message below:  Spoke with [facility] regarding ACTH Stimulation test and tech advises this testing must be completed at a providers office as the pt will receive an injection prior to the lab draw and and or subsequent lab draws.  Pt has been notified of this information and PCP included in this note to assist in arranging further testing.   We do not do this testing here, this will need to be done with endocrinology. Referral placed.

## 2024-02-07 NOTE — Progress Notes (Signed)
Orders placed per dr Graciela Husbands and pt notified she may complete testing through [facility].  Can we arrange for ACTH stimulation test  I would like to check the lipids again and perhaps adjust the crestor-- can she get that done when she gets the ACTH

## 2024-02-08 NOTE — Telephone Encounter (Signed)
I called and spoke with patient and notified her of below message.

## 2024-02-11 LAB — LIPID PANEL
Chol/HDL Ratio: 3.9 {ratio} (ref 0.0–4.4)
Cholesterol, Total: 175 mg/dL (ref 100–199)
HDL: 45 mg/dL (ref >39)
LDL Chol Calc (NIH): 100 mg/dL — ABNORMAL HIGH (ref 0–99)
Triglycerides: 170 mg/dL — ABNORMAL HIGH (ref 0–149)
VLDL Cholesterol Cal: 30 mg/dL (ref 5–40)

## 2024-02-22 ENCOUNTER — Encounter: Payer: Self-pay | Admitting: Nurse Practitioner

## 2024-02-29 ENCOUNTER — Telehealth: Payer: Self-pay

## 2024-02-29 MED ORDER — ROSUVASTATIN CALCIUM 20 MG PO TABS: 20.0000 mg | ORAL_TABLET | Freq: Every day | ORAL | 3 refills | Status: AC

## 2024-02-29 NOTE — Telephone Encounter (Signed)
 Spoke with pt and advised of lab results and recommendations as below per Dr Graciela Husbands.  Pt verbalizes understanding and agrees with current plan.   Duke Salvia, MD 02/15/2024 10:07 AM EST     Please Inform Patient that -LDL is stil high  -- if she has tolerated the rosuvastatin 10 would she be ok to try and take 20 to get the LDL from 100 ( down from 177) towards 70s thnk Thanks

## 2024-03-04 ENCOUNTER — Other Ambulatory Visit: Payer: Self-pay | Admitting: Nurse Practitioner

## 2024-03-04 NOTE — Telephone Encounter (Signed)
 Requesting: AMLODIPINE BESYLATE 5MG  TABLETS  Last Visit: 12/03/2023 Next Visit: 06/02/2024 Last Refill: 09/03/2023  Please Advise

## 2024-03-05 ENCOUNTER — Ambulatory Visit: Payer: 59 | Admitting: Internal Medicine

## 2024-03-24 ENCOUNTER — Ambulatory Visit (INDEPENDENT_AMBULATORY_CARE_PROVIDER_SITE_OTHER): Payer: BC Managed Care – PPO

## 2024-03-24 DIAGNOSIS — I4729 Other ventricular tachycardia: Secondary | ICD-10-CM | POA: Diagnosis not present

## 2024-03-24 DIAGNOSIS — I429 Cardiomyopathy, unspecified: Secondary | ICD-10-CM

## 2024-03-24 DIAGNOSIS — I472 Ventricular tachycardia, unspecified: Secondary | ICD-10-CM

## 2024-03-24 LAB — CUP PACEART REMOTE DEVICE CHECK
Battery Remaining Longevity: 40 mo
Battery Voltage: 2.92 V
Brady Statistic AP VP Percent: 0 %
Brady Statistic AP VS Percent: 0.02 %
Brady Statistic AS VP Percent: 0.03 %
Brady Statistic AS VS Percent: 99.94 %
Brady Statistic RA Percent Paced: 0.02 %
Brady Statistic RV Percent Paced: 0.04 %
Date Time Interrogation Session: 20250331052605
HighPow Impedance: 50 Ohm
HighPow Impedance: 61 Ohm
Implantable Lead Connection Status: 753985
Implantable Lead Connection Status: 753985
Implantable Lead Implant Date: 20120308
Implantable Lead Implant Date: 20120308
Implantable Lead Location: 753859
Implantable Lead Location: 753860
Implantable Lead Model: 5076
Implantable Lead Model: 6947
Implantable Pulse Generator Implant Date: 20190913
Lead Channel Impedance Value: 323 Ohm
Lead Channel Impedance Value: 380 Ohm
Lead Channel Impedance Value: 437 Ohm
Lead Channel Pacing Threshold Amplitude: 0.375 V
Lead Channel Pacing Threshold Amplitude: 0.625 V
Lead Channel Pacing Threshold Pulse Width: 0.4 ms
Lead Channel Pacing Threshold Pulse Width: 0.4 ms
Lead Channel Sensing Intrinsic Amplitude: 2.625 mV
Lead Channel Sensing Intrinsic Amplitude: 2.625 mV
Lead Channel Sensing Intrinsic Amplitude: 6.25 mV
Lead Channel Sensing Intrinsic Amplitude: 6.25 mV
Lead Channel Setting Pacing Amplitude: 1.5 V
Lead Channel Setting Pacing Amplitude: 2 V
Lead Channel Setting Pacing Pulse Width: 0.4 ms
Lead Channel Setting Sensing Sensitivity: 0.45 mV
Zone Setting Status: 755011
Zone Setting Status: 755011

## 2024-04-05 ENCOUNTER — Encounter: Payer: Self-pay | Admitting: Internal Medicine

## 2024-04-09 ENCOUNTER — Other Ambulatory Visit

## 2024-04-14 ENCOUNTER — Ambulatory Visit: Admitting: "Endocrinology

## 2024-04-14 ENCOUNTER — Encounter: Payer: Self-pay | Admitting: "Endocrinology

## 2024-04-14 VITALS — BP 106/80 | HR 83 | Ht 65.0 in | Wt 226.0 lb

## 2024-04-14 DIAGNOSIS — R7989 Other specified abnormal findings of blood chemistry: Secondary | ICD-10-CM | POA: Diagnosis not present

## 2024-04-14 NOTE — Progress Notes (Signed)
 Outpatient Endocrinology Note Jorge Newcomer, MD    Alexandra Hood 14-May-1971 [id number]  Referring Provider: Odette Benjamin, NP Primary Care Provider: Odette Benjamin, NP Reason for consultation: Subjective   Assessment & Plan  Diagnoses and all orders for this visit:  Low serum cortisol level -     Cortisol -     ACTH -     DHEA-sulfate   12/31/23: 4 pm cortisol low at 5.4, triggering referral Hasn't lost much weight in last 3 mo, no vomiting/diarrhea  Reports some abdominal pain off and on, extreme fatigue, nausea (every week a day or so) Used to be on steroids, took prednisone  for 8 years due to sarcoidosis-last taken before 2020 03/12/20: CT ABDOMEN AND PELVIS WITH CONTRAST: Adrenals/Urinary Tract: No adrenal abnormality.  12/29/22: CT CHEST WITHOUT CONTRAST adrenal glands unremarkable  Ordered 8 am lab, will follow up if cortisol is low   Return for 8 am labs before next visit.   I have reviewed current medications, nurse's notes, allergies, vital signs, past medical and surgical history, family medical history, and social history for this encounter. Counseled patient on symptoms, examination findings, lab findings, imaging results, treatment decisions and monitoring and prognosis. The patient understood the recommendations and agrees with the treatment plan. All questions regarding treatment plan were fully answered.  Jorge Newcomer, MD  04/14/24   History of Present Illness HPI  Alexandra Hood is a 53 y.o. year old female who presents for evaluation of low cortisol.  Hasn't lost much weight in last 3 mo Reports some abdominal pain off and on C/o extreme fatigue, nausea (every week a day or so), no vomiting/diarrhea  Used to be on steroids, took prednisone  for 8 years due to sarcoidosis-last taken before 2020  03/12/20: CT ABDOMEN AND PELVIS WITH CONTRAST: Adrenals/Urinary Tract: No adrenal abnormality.  12/29/22: CT CHEST WITHOUT CONTRAST  adrenal glands unremarkable   Physical Exam  BP 106/80   Pulse 83   Ht 5\' 5"  (1.651 m)   Wt 226 lb (102.5 kg)   LMP 12/25/2006   SpO2 95%   BMI 37.61 kg/m    Constitutional: well developed, well nourished Head: normocephalic, atraumatic Eyes: sclera anicteric, no redness Neck: supple Lungs: normal respiratory effort Neurology: alert and oriented Skin: dry, no appreciable rashes Musculoskeletal: no appreciable defects Psychiatric: normal mood and affect   Current Medications Patient's Medications  New Prescriptions   No medications on file  Previous Medications   ALBUTEROL  (VENTOLIN  HFA) 108 (90 BASE) MCG/ACT INHALER    Inhale 2 puffs into the lungs every 6 (six) hours as needed for wheezing or shortness of breath.   AMLODIPINE  (NORVASC ) 5 MG TABLET    TAKE 1 TABLET(5 MG) BY MOUTH DAILY   CEPHALEXIN  (KEFLEX ) 500 MG CAPSULE    Take 1 capsule (500 mg total) by mouth 2 (two) times daily.   GALCANEZUMAB -GNLM (EMGALITY ) 120 MG/ML SOSY    INJECT 120MG  UNDER THE SKIN EVERY 30 DAYS   IBUPROFEN  (ADVIL ) 800 MG TABLET    Take 1 tablet (800 mg total) by mouth every 8 (eight) hours as needed (pain).   MELOXICAM  (MOBIC ) 7.5 MG TABLET    TAKE 1 TABLET(7.5 MG) BY MOUTH DAILY   METOPROLOL  TARTRATE (LOPRESSOR ) 100 MG TABLET    Take one tablet by mouth 2 hours prior to CT   MULTIPLE VITAMIN (MULTIVITAMIN WITH MINERALS) TABS TABLET    Take 1 tablet by mouth daily.   PHENAZOPYRIDINE  (PYRIDIUM ) 100 MG TABLET  Take 1 tablet (100 mg total) by mouth 3 (three) times daily as needed for pain.   RIMEGEPANT SULFATE (NURTEC) 75 MG TBDP    Take 1 tablet (75 mg total) by mouth daily as needed.   ROSUVASTATIN  (CRESTOR ) 20 MG TABLET    Take 1 tablet (20 mg total) by mouth daily.   SPIRONOLACTONE  (ALDACTONE ) 25 MG TABLET    TAKE 1/2 TABLET(12.5 MG) BY MOUTH DAILY   TIOTROPIUM BROMIDE  MONOHYDRATE (SPIRIVA  RESPIMAT) 1.25 MCG/ACT AERS    Inhale 2 puffs into the lungs daily.   TOPIRAMATE  (TOPAMAX ) 25 MG TABLET     Take 1 tablet (25 mg total) by mouth at bedtime.   VITAMIN D , ERGOCALCIFEROL , (DRISDOL ) 1.25 MG (50000 UNIT) CAPS CAPSULE    Take 1 capsule (50,000 Units total) by mouth every 7 (seven) days.  Modified Medications   No medications on file  Discontinued Medications   No medications on file    Allergies No Known Allergies  Past Medical History Past Medical History:  Diagnosis Date   Headache    Lupus pernio    -skin bx confirmed at [facility] 05/07/2009 (personally reviewed) -looks improved 07/09/2009 -   Pertussis    Pseudotumor cerebri    .[facility] Annabell Key and Dr. Candi Chafe - dxed Jan 2009, on diamox   - LP 01/24/2008 at [facility] - Normal ( pressure 14cm, gluc 68, Protein 23, Clear, colorless, RBC 3, WBC - too few to count)  - CT head non contrast done at [facility] 11/25/2007: "normal". Ddoes not report MRI, MRA, MRV being done - 2nd opinon by Dr. Janett Medin - reportdly had MRI/MRV both normal; followup pending   Pulmonary sarcoidosis (HCC)    - Spiro 07/25/2010 on pred 10/mtx 10 -> FEv1 1.84L/775%, FVC 2.25L - SPiro 09/06/2010 -> Fev1 2.14L/81%. Cut pred to 5mg  per day. Cont mtx - SPiro 01/20/2011 - Fev1 1.6L/61%, Ratio 74%. CXR Worse -> increased pred to 10mg  perday. Cont Mtx.     Ventricular tachycardia (HCC)    Vitamin D  deficiency    - Aug 2011: 22 and low -> commence Rx. - Jan 2012: 11    Past Surgical History Past Surgical History:  Procedure Laterality Date   CARDIAC DEFIBRILLATOR PLACEMENT  march 2012   [employer]   ESOPHAGOGASTRODUODENOSCOPY (EGD) WITH PROPOFOL  N/A 02/25/2016   Procedure: ESOPHAGOGASTRODUODENOSCOPY (EGD) WITH PROPOFOL ;  Surgeon: Jolinda Necessary, MD;  Location: [facility] ENDOSCOPY;  Service: Endoscopy;  Laterality: N/A;   ICD GENERATOR CHANGEOUT N/A 09/06/2018   Procedure: ICD GENERATOR CHANGEOUT;  Surgeon: Verona Goodwill, MD;  Location[facility] INVASIVE CV LAB;  Service: Cardiovascular;  Laterality: N/A;   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)  Tobacco Use   Smoking status: Former    Current packs/day: 0.00  Average packs/day: 1 pack/day for 15.0 years (15.0 ttl pk-yrs)    Types: Cigarettes    Start date: 03/02/1992    Quit date: 03/03/2007    Years since quitting: 17.1   Smokeless tobacco: Never  Vaping Use   Vaping status: Never Used  Substance and Sexual Activity   Alcohol use: No    Alcohol/week: 0.0 standard drinks of alcohol   Drug use: No   Sexual activity: Not Currently    Comment: bilateral oophorectomy  Other Topics Concern   Not on file  Social History Narrative   Right handed   Drinks caffeine   Lives alone   Two floor home   Social Drivers of Health   Financial Resource Strain: Low Risk  (02/06/2024)   Overall Financial Resource Strain (CARDIA)    Difficulty of Paying Living Expenses: Not hard at all  Food Insecurity: No Food Insecurity (02/06/2024)   Hunger Vital Sign    Worried About Running Out of Food in the Last Year: Never true    Ran Out of Food in the Last Year: Never true  Transportation Needs: No Transportation Needs (02/06/2024)   PRAPARE - Administrator, Civil Service (Medical): No    Lack of Transportation (Non-Medical): No  Physical Activity: Insufficiently Active (02/06/2024)    Exercise Vital Sign    Days of Exercise per Week: 4 days    Minutes of Exercise per Session: 20 min  Stress: No Stress Concern Present (02/06/2024)   [employer] of Occupational Health - Occupational Stress Questionnaire    Feeling of Stress : Not at all  Social Connections: Moderately Isolated (02/06/2024)   Social Connection and Isolation Panel [NHANES]    Frequency of Communication with Friends and Family: More than three times a week    Frequency of Social Gatherings with Friends and Family: More than three times a week    Attends Religious Services: 1 to 4 times per year    Active Member of Clubs or Organizations: No    Attends Banker Meetings: Not on file    Marital Status: Separated  Intimate Partner Violence: Not on file    Lab Results  Component Value Date   CHOL 175 02/11/2024   Lab Results  Component Value Date   HDL 45 02/11/2024   Lab Results  Component Value Date   LDLCALC 100 (H) 02/11/2024   Lab Results  Component Value Date   TRIG 170 (H) 02/11/2024   Lab Results  Component Value Date   CHOLHDL 3.9 02/11/2024   Lab Results  Component Value Date   CREATININE 0.78 12/03/2023   Lab Results  Component Value Date   GFR 88.55 12/03/2023      Component Value Date/Time   NA 141 12/03/2023 0949   NA 141 03/25/2020 1042   K 3.8 12/03/2023 0949   CL 102 12/03/2023 0949   CO2 28 12/03/2023 0949   GLUCOSE 112 (H) 12/03/2023 0949   BUN 13 12/03/2023 0949   BUN 10 03/25/2020 1042   CREATININE 0.78 12/03/2023 0949   CREATININE 0.61 01/10/2016 1334   CALCIUM  9.7 12/03/2023 0949   PROT 7.2 12/03/2023 0949   PROT 6.9 03/25/2020 1042   ALBUMIN 4.0 12/03/2023 0949   ALBUMIN 4.4 03/25/2020 1042   AST 15 12/03/2023 0949   ALT 20 12/03/2023 0949   ALKPHOS 86 12/03/2023 0949   BILITOT 0.4 12/03/2023 0949   BILITOT <0.2 03/25/2020 1042   GFRNONAA 104 03/25/2020 1042  GFRNONAA >89 05/21/2014 1318   GFRAA 120 03/25/2020 1042   GFRAA >89  05/21/2014 1318      Latest Ref Rng & Units 12/03/2023    9:49 AM 07/18/2023    9:39 AM 03/12/2023    3:21 PM  BMP  Glucose 70 - 99 mg/dL 409  811  97   BUN 6 - 23 mg/dL 13  11  14    Creatinine 0.40 - 1.20 mg/dL 9.14  7.82  9.56   Sodium 135 - 145 mEq/L 141  140  139   Potassium 3.5 - 5.1 mEq/L 3.8  4.5  4.5   Chloride 96 - 112 mEq/L 102  102  101   CO2 19 - 32 mEq/L 28  31  27    Calcium  8.4 - 10.5 mg/dL 9.7  9.8  9.8        Component Value Date/Time   WBC 12.0 (H) 12/31/2023 1602   WBC 9.0 12/03/2023 0949   RBC 4.70 12/31/2023 1602   RBC 4.43 12/03/2023 0949   HGB 13.4 12/31/2023 1602   HGB 12.3 03/27/2012 1358   HCT 41.9 12/31/2023 1602   HCT 37.9 03/27/2012 1358   PLT 406 12/31/2023 1602   MCV 89 12/31/2023 1602   MCV 86.1 03/27/2012 1358   MCH 28.5 12/31/2023 1602   MCH 28.3 03/12/2020 1844   MCHC 32.0 12/31/2023 1602   MCHC 32.6 12/03/2023 0949   RDW 12.9 12/31/2023 1602   RDW 13.9 03/27/2012 1358   LYMPHSABS 2.4 12/03/2023 0949   LYMPHSABS 2.1 03/25/2020 1042   LYMPHSABS 1.3 03/27/2012 1358   MONOABS 0.5 12/03/2023 0949   MONOABS 0.6 03/27/2012 1358   EOSABS 0.1 12/03/2023 0949   EOSABS 0.2 03/25/2020 1042   BASOSABS 0.0 12/03/2023 0949   BASOSABS 0.1 03/25/2020 1042   BASOSABS 0.0 03/27/2012 1358   Lab Results  Component Value Date   TSH 1.590 12/31/2023   TSH 2.350 03/25/2020   TSH 1.670 10/10/2017         Parts of this note may have been dictated using voice recognition software. There may be variances in spelling and vocabulary which are unintentional. Not all errors are proofread. Please notify the [facility] if any discrepancies are noted or if the meaning of any statement is not clear.

## 2024-04-17 ENCOUNTER — Other Ambulatory Visit

## 2024-04-20 LAB — DHEA-SULFATE: DHEA-SO4: 80 ug/dL (ref 15–205)

## 2024-04-20 LAB — ACTH: C206 ACTH: 32 pg/mL (ref 6–50)

## 2024-04-20 LAB — CORTISOL: Cortisol, Plasma: 21.6 ug/dL

## 2024-04-28 ENCOUNTER — Encounter: Payer: Self-pay | Admitting: "Endocrinology

## 2024-04-29 ENCOUNTER — Encounter: Payer: Self-pay | Admitting: Nurse Practitioner

## 2024-04-29 ENCOUNTER — Ambulatory Visit: Admitting: Nurse Practitioner

## 2024-04-29 VITALS — BP 120/82 | HR 98 | Temp 97.1°F | Ht 65.0 in | Wt 228.8 lb

## 2024-04-29 DIAGNOSIS — R300 Dysuria: Secondary | ICD-10-CM | POA: Diagnosis not present

## 2024-04-29 DIAGNOSIS — N3001 Acute cystitis with hematuria: Secondary | ICD-10-CM | POA: Diagnosis not present

## 2024-04-29 LAB — POCT URINALYSIS DIPSTICK
Bilirubin, UA: POSITIVE
Blood, UA: POSITIVE
Glucose, UA: POSITIVE — AB
Ketones, UA: POSITIVE
Nitrite, UA: POSITIVE
Protein, UA: POSITIVE — AB
Spec Grav, UA: 1.025 (ref 1.010–1.025)
Urobilinogen, UA: 4 U/dL — AB
pH, UA: 5.5 (ref 5.0–8.0)

## 2024-04-29 MED ORDER — CEPHALEXIN 500 MG PO CAPS: 500.0000 mg | ORAL_CAPSULE | Freq: Two times a day (BID) | ORAL | 0 refills | Status: DC

## 2024-04-29 MED ORDER — FLUCONAZOLE 150 MG PO TABS: ORAL_TABLET | ORAL | 0 refills | Status: DC

## 2024-04-29 NOTE — Patient Instructions (Signed)
 It was great to see you!  Start keflex  twice a day for 7 days  Take diflucan  after finishing antibiotic  Keep drinking plenty of fluids and taking azo  Let's follow-up if symptoms worsen or don't improve.   Take care,  Rheba Cedar, NP

## 2024-04-29 NOTE — Assessment & Plan Note (Signed)
 U/A positive for 3+ leukocytes, nitrites, and blood. Will treat for UTI.

## 2024-04-29 NOTE — Progress Notes (Signed)
 Acute Office Visit  Subjective:     Patient ID: Alexandra Hood, female    DOB: 06/22/1971, 53 y.o.   MRN: [id number]  Chief Complaint  Patient presents with   Urinary Tract Infection    Burning with urination since Sunday, blood in urine since last night   HPI  Discussed the use of AI scribe software for clinical note transcription with the patient, who gave verbal consent to proceed.  History of Present Illness   Alexandra Hood is a 53 year old female with a history of urinary tract infections who presents with dysuria and hematuria.  She experiences burning during urination and hematuria for three days, with frequent urination producing small amounts of urine. She feels a sensation of swelling and is uncertain if the discomfort is itching or pain. Right-sided pelvic pain radiates to her back, with mild nausea over the weekend, particularly on Saturday and Sunday. There is no fever.  For symptom management, she takes Tylenol  and Azo, increases water intake, and consumes cranberry products. She took Keflex  in February for a similar issue and is not allergic to antibiotics. She sometimes develops yeast infections when taking antibiotics. There is no vaginal discharge.      ROS See pertinent positives and negatives per HPI.     Objective:    BP 120/82 (BP Location: Left Arm, Patient Position: Sitting, Cuff Size: Normal)   Pulse 98   Temp (!) 97.1 F (36.2 C)   Ht 5\' 5"  (1.651 m)   Wt 228 lb 12.8 oz (103.8 kg)   LMP 12/25/2006   SpO2 98%   BMI 38.07 kg/m    Physical Exam Vitals and nursing note reviewed.  Constitutional:      General: She is not in acute distress.    Appearance: Normal appearance.  HENT:     Head: Normocephalic.  Eyes:     Conjunctiva/sclera: Conjunctivae normal.  Cardiovascular:     Rate and Rhythm: Normal rate and regular rhythm.     Pulses: Normal pulses.     Heart sounds: Normal heart sounds.  Pulmonary:     Effort: Pulmonary  effort is normal.     Breath sounds: Normal breath sounds.  Abdominal:     Palpations: Abdomen is soft.     Tenderness: There is abdominal tenderness (RLQ). There is right CVA tenderness (slight). There is no left CVA tenderness.  Musculoskeletal:     Cervical back: Normal range of motion.  Skin:    General: Skin is warm.  Neurological:     General: No focal deficit present.     Mental Status: She is alert and oriented to person, place, and time.  Psychiatric:        Mood and Affect: Mood normal.        Behavior: Behavior normal.        Thought Content: Thought content normal.        Judgment: Judgment normal.     Results for orders placed or performed in visit on 04/29/24  POCT urinalysis dipstick  Result Value Ref Range   Color, UA     Clarity, UA     Glucose, UA Positive (A) Negative   Bilirubin, UA Positive    Ketones, UA Positive    Spec Grav, UA 1.025 1.010 - 1.025   Blood, UA Positive    pH, UA 5.5 5.0 - 8.0   Protein, UA Positive (A) Negative   Urobilinogen, UA 4.0 (A) 0.2 or 1.0 E.U./dL   Nitrite,  UA Positive    Leukocytes, UA Large (3+) (A) Negative   Appearance     Odor          Assessment & Plan:   Problem List Items Addressed This Visit       Other   Dysuria   U/A positive for 3+ leukocytes, nitrites, and blood. Will treat for UTI.       Relevant Orders   POCT urinalysis dipstick (Completed)   Other Visit Diagnoses       Acute cystitis with hematuria    -  Primary   Treat with keflex  500mg  BID x7 days. Encourage fluids, can cont. azo. Send urine for culture.   Relevant Orders   Urine Culture            Meds ordered this encounter  Medications   cephALEXin  (KEFLEX ) 500 MG capsule    Sig: Take 1 capsule (500 mg total) by mouth 2 (two) times daily.    Dispense:  14 capsule    Refill:  0   fluconazole  (DIFLUCAN ) 150 MG tablet    Sig: Take 1 tablet after finishing keflex , then a second tablet 3 days later if needed    Dispense:  2  tablet    Refill:  0    Return if symptoms worsen or fail to improve.  Odette Benjamin, NP

## 2024-04-30 LAB — URINE CULTURE
MICRO NUMBER:: 16419349
Result:: NO GROWTH
SPECIMEN QUALITY:: ADEQUATE

## 2024-05-01 ENCOUNTER — Encounter: Payer: Self-pay | Admitting: Nurse Practitioner

## 2024-05-01 DIAGNOSIS — N898 Other specified noninflammatory disorders of vagina: Secondary | ICD-10-CM

## 2024-05-07 NOTE — Progress Notes (Signed)
 Remote ICD transmission.

## 2024-05-12 NOTE — Progress Notes (Signed)
 [facility] [address] [place] [address] Dept: [phone] Dept Fax: [phone]  Acute Care Office Visit  Subjective:   Alexandra Hood 1971-05-27 05/13/2024  Chief Complaint  Patient presents with   Follow-up    Itching after taking antibiotic     HPI: Discussed the use of AI scribe software for clinical note transcription with the patient, who gave verbal consent to proceed.  History of Present Illness   A 53 year old female presents with persistent vaginal itching and burning.  Approximately two weeks ago, she experienced symptoms consistent with a urinary tract infection, including pressure, dysuria, flank pain, itching, and burning. She was prescribed Keflex , which resolved all symptoms except for the itching and burning.  Subsequently, she was prescribed two doses of Diflucan  for the persistent itching and burning, but these symptoms did not improve. She describes the burning as a 'raw feeling' and notes that it persists despite treatment.  No fever, hematuria, or vaginal discharge. She is sexually active with one female partner and does not consistently use protection. No foul odor or rash. No known medication allergies and no changes in her medications.       The following portions of the patient's history were reviewed and updated as appropriate: past medical history, past surgical history, family history, social history, allergies, medications, and problem list.   Patient Active Problem List   Diagnosis Date Noted   Routine general medical examination at a health care facility 12/03/2023   Chronic right shoulder pain 07/18/2023   Pure hypercholesterolemia 07/18/2023   Primary hypertension 03/12/2023   Tear of right acetabular labrum 04/24/2020   Tear of left acetabular labrum 04/24/2020   Trochanteric bursitis of right hip 04/20/2020   Hip pain, acute, left 04/20/2020   Leukocytosis 03/25/2020    Dysuria 03/25/2020   Right lower quadrant abdominal tenderness with rebound tenderness 03/25/2020   Right lower quadrant abdominal pain 03/12/2020   Alopecia 01/08/2019   Papilledema of right eye 01/08/2019   Migraine 01/07/2019   Gross hematuria 06/15/2018   Flank pain 10/23/2016   Multiple pulmonary nodules 08/24/2014   Pulmonary sarcoidosis (HCC) 08/24/2014   Sinus tachycardia 10/23/2013   Cardiomyopathy, 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   Thrombocytosis 03/06/2012   Obesity 03/06/2012   Pre-diabetes 03/06/2012   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 Candi Chafe - dxed Jan 2009, on diamox   - LP 01/24/2008 at [facility] - Normal ( pressure 14cm, gluc 68, Protein 23, Clear, colorless, RBC 3, WBC - too few to count)  - CT head non contrast done at [facility] 11/25/2007: "normal". Ddoes not report MRI, MRA, MRV being done - 2nd opinon by Dr. Janett Medin - reportdly had MRI/MRV both normal; followup pending   Pulmonary sarcoidosis (HCC)    - Spiro 07/25/2010 on pred 10/mtx 10 -> FEv1 1.84L/775%, FVC 2.25L - SPiro 09/06/2010 -> Fev1 2.14L/81%. Cut pred to 5mg  per day. Cont mtx - SPiro 01/20/2011 - Fev1 1.6L/61%, Ratio 74%. CXR Worse -> increased pred to 10mg  perday. Cont Mtx.  Ventricular tachycardia (HCC)    Vitamin D  deficiency    - Aug 2011: 22 and low -> commence Rx. 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   Family History  Problem Relation Age of Onset   Allergies Mother    Diabetes Mother    Hypertension Mother    Hyperlipidemia Mother    Migraines Mother    Hyperlipidemia Father    Hypertension Father    Diabetes Father    Stroke Father    Hyperlipidemia Sister    Hypertension Sister    Meniere's disease Sister    Diabetes Sister    COPD Maternal Grandfather    Leukemia Paternal Grandmother    Heart attack Paternal Grandfather    Colon cancer Neg Hx    Esophageal cancer Neg Hx    Rectal cancer Neg Hx    Stomach cancer Neg Hx     Current Outpatient Medications:    albuterol  (VENTOLIN  HFA) 108 (90 Base) MCG/ACT inhaler, Inhale 2 puffs into the lungs every 6 (six) hours as needed for wheezing or shortness of breath., Disp: 8 g, Rfl: 3   amLODipine  (NORVASC ) 5 MG tablet, TAKE 1 TABLET(5 MG) BY MOUTH DAILY, Disp: 90 tablet, Rfl: 1   Galcanezumab -gnlm (EMGALITY ) 120 MG/ML SOSY, INJECT 120MG  UNDER THE SKIN EVERY 30 DAYS, Disp: 1 mL, Rfl: 11   ibuprofen  (ADVIL ) 800 MG tablet, Take 1 tablet (800 mg total) by mouth every 8 (eight) hours as needed (pain)., Disp: 21 tablet, Rfl: 0   meloxicam  (MOBIC ) 7.5 MG tablet, TAKE 1 TABLET(7.5 MG) BY MOUTH DAILY, Disp: 30 tablet, Rfl: 0   metroNIDAZOLE (FLAGYL) 500 MG tablet, Take 1 tablet (500 mg total) by mouth 2 (two) times daily for 7 days., Disp: 14 tablet, Rfl: 0   Multiple Vitamin (MULTIVITAMIN WITH MINERALS) TABS tablet, Take 1 tablet by mouth daily., Disp: , Rfl:    Rimegepant Sulfate (NURTEC) 75 MG TBDP, Take 1 tablet (75 mg total) by mouth daily as needed.,  Disp: 16 tablet, Rfl: 11   rosuvastatin  (CRESTOR ) 20 MG tablet, Take 1 tablet (20 mg total) by mouth daily., Disp: 90 tablet, Rfl: 3   spironolactone  (ALDACTONE ) 25 MG tablet, TAKE 1/2 TABLET(12.5 MG) BY MOUTH DAILY, Disp: 45 tablet, Rfl: 3   Tiotropium Bromide  Monohydrate (SPIRIVA  RESPIMAT) 1.25 MCG/ACT AERS, Inhale 2 puffs into the lungs daily., Disp: 4 g, Rfl: 6   topiramate  (TOPAMAX ) 25 MG tablet, Take 1 tablet (25 mg total) by mouth at bedtime., Disp: 90 tablet, Rfl: 3   Vitamin D , Ergocalciferol , (DRISDOL ) 1.25 MG (50000 UNIT) CAPS capsule, Take 1 capsule (50,000 Units total) by mouth every 7 (seven) days., Disp: 12 capsule, Rfl: 0   cephALEXin  (KEFLEX ) 500 MG capsule, Take 1 capsule (500 mg total) by mouth 2 (two) times daily. (Patient not taking: Reported on 05/13/2024), Disp: 14 capsule, Rfl: 0   fluconazole  (DIFLUCAN ) 150 MG tablet, Take 1 tablet after finishing keflex , then a second tablet 3 days later  if needed (Patient not taking: Reported on 05/13/2024), Disp: 2 tablet, Rfl: 0   metoprolol  tartrate (LOPRESSOR ) 100 MG tablet, Take one tablet by mouth 2 hours prior to CT (Patient not taking: Reported on 05/13/2024), Disp: 1 tablet, Rfl: 3   phenazopyridine  (PYRIDIUM ) 100 MG tablet, Take 1 tablet (100 mg total) by mouth 3 (three) times daily as needed for pain. (Patient not taking: Reported on 05/13/2024), Disp: 10 tablet, Rfl: 0 No Known Allergies   ROS: A complete ROS was performed with pertinent positives/negatives noted in the HPI. The remainder of the ROS are negative.    Objective:   Today's Vitals   05/13/24 0859  BP: 116/74  Pulse: 98  Temp: 97.7 F (36.5 C)  TempSrc: Temporal  SpO2: 98%  Weight: 226 lb 3.2 oz (102.6 kg)  Height: 5\' 5"  (1.651 m)    GENERAL: Well-appearing, in NAD. Well nourished.  SKIN: Pink, warm and dry.  RESPIRATORY: Chest wall symmetrical. Respirations even and non-labored. Breath sounds clear to auscultation bilaterally.  CARDIAC: S1, S2 present,  regular rate and rhythm. Peripheral pulses 2+ bilaterally.  GU: External genitalia without erythema, lesions, or masses. No lymphadenopathy. Vaginal mucosa pink and moist. No exudate, lesions, or ulcerations. Moderate amount of thin white vaginal discharge within vaginal vault. Cervix pink with white thin discharge. Cervical os closed. Chaperoned by Vallorie Gayer CMA NEUROLOGIC: Steady, even gait.  PSYCH/MENTAL STATUS: Alert, oriented x 3. Cooperative, appropriate mood and affect.    No results found for any visits on 05/13/24.    Assessment & Plan:  Assessment and Plan    Vaginal itching and burning Persistent symptoms despite Diflucan . Differential includes bacterial vaginosis, persistent yeast infection, or STIs. Pelvic exam showed moderate thin, milky white discharge. - Perform cervical vaginal swab for bacterial vaginosis, yeast, gonorrhea, chlamydia, and trichomonas. - Prescribe Flagyl 500 mg BID for 7 days for possible bacterial vaginosis. - Await vaginal swab results.    Meds ordered this encounter  Medications   metroNIDAZOLE (FLAGYL) 500 MG tablet    Sig: Take 1 tablet (500 mg total) by mouth 2 (two) times daily for 7 days.    Dispense:  14 tablet    Refill:  0    Supervising Provider:   THOMPSON, AARON B [[id number]]   No orders of the defined types were placed in this encounter.  Lab Orders  No laboratory test(s) ordered today   No images are attached to the encounter or orders placed in the encounter.  Return if symptoms worsen or fail to improve.   Gavin Kast, FNP

## 2024-05-13 ENCOUNTER — Other Ambulatory Visit (HOSPITAL_COMMUNITY)
Admission: RE | Admit: 2024-05-13 | Discharge: 2024-05-13 | Disposition: A | Discharge status: Home / Self Care | Source: Ambulatory Visit | Attending: Internal Medicine | Admitting: Internal Medicine

## 2024-05-13 ENCOUNTER — Ambulatory Visit: Admitting: Internal Medicine

## 2024-05-13 ENCOUNTER — Encounter: Payer: Self-pay | Admitting: Internal Medicine

## 2024-05-13 VITALS — BP 116/74 | HR 98 | Temp 97.7°F | Ht 65.0 in | Wt 226.2 lb

## 2024-05-13 DIAGNOSIS — N898 Other specified noninflammatory disorders of vagina: Secondary | ICD-10-CM

## 2024-05-13 MED ORDER — METRONIDAZOLE 500 MG PO TABS: 500.0000 mg | ORAL_TABLET | Freq: Two times a day (BID) | ORAL | 0 refills | Status: AC

## 2024-05-14 LAB — CERVICOVAGINAL ANCILLARY ONLY
Bacterial Vaginitis (gardnerella): POSITIVE — AB
Candida Glabrata: NEGATIVE
Candida Vaginitis: NEGATIVE
Chlamydia: NEGATIVE
Comment: NEGATIVE
Comment: NEGATIVE
Comment: NEGATIVE
Comment: NEGATIVE
Comment: NEGATIVE
Comment: NORMAL
Neisseria Gonorrhea: NEGATIVE
Trichomonas: POSITIVE — AB

## 2024-05-15 ENCOUNTER — Ambulatory Visit: Payer: Self-pay | Admitting: Internal Medicine

## 2024-05-16 MED ORDER — BETAMETHASONE DIPROPIONATE 0.05 % EX CREA: TOPICAL_CREAM | Freq: Two times a day (BID) | CUTANEOUS | 1 refills | Status: AC | PRN

## 2024-06-02 ENCOUNTER — Encounter: Payer: Self-pay | Admitting: Nurse Practitioner

## 2024-06-02 ENCOUNTER — Ambulatory Visit (INDEPENDENT_AMBULATORY_CARE_PROVIDER_SITE_OTHER): Payer: BC Managed Care – PPO | Admitting: Nurse Practitioner

## 2024-06-02 VITALS — BP 128/80 | HR 100 | Temp 97.7°F | Ht 65.0 in | Wt 227.6 lb

## 2024-06-02 DIAGNOSIS — D860 Sarcoidosis of lung: Secondary | ICD-10-CM

## 2024-06-02 DIAGNOSIS — I429 Cardiomyopathy, unspecified: Secondary | ICD-10-CM

## 2024-06-02 DIAGNOSIS — I10 Essential (primary) hypertension: Secondary | ICD-10-CM

## 2024-06-02 DIAGNOSIS — R5383 Other fatigue: Secondary | ICD-10-CM

## 2024-06-02 DIAGNOSIS — J069 Acute upper respiratory infection, unspecified: Secondary | ICD-10-CM | POA: Diagnosis not present

## 2024-06-02 NOTE — Assessment & Plan Note (Signed)
She is currently following with Dr. Chase Caller for this. Continue collaborations and recommendations from pulmonology

## 2024-06-02 NOTE — Assessment & Plan Note (Signed)
Chronic, stable.  Continue amlodipine 5 mg daily and spironolactone 12.5 mg daily.  Follow-up in 6 months.

## 2024-06-02 NOTE — Progress Notes (Signed)
 Established Patient Office Visit  Subjective   Patient ID: Alexandra Hood, female    DOB: Mar 10, 1971  Age: 53 y.o. MRN: [id number]  Chief Complaint  Patient presents with   Hypertension    Follow up, no concerns    HPI Discussed the use of AI scribe software for clinical note transcription with the patient, who gave verbal consent to proceed.  History of Present Illness   Alexandra Hood is a 53 year old female who presents for a routine mid-year follow-up.  She experiences extreme fatigue despite adequate sleep, with slight improvement through increased activity. Walking her dog provides some relief. Recent cold symptoms include congestion and cough, for which she is taking coricidin. She denies headaches, fevers, chest pain, shortness of breath, abdominal pain, or gastrointestinal issues. Her children have been sick recently which is where she thinks she got it from.       ROS See pertinent positives and negatives per HPI.    Objective:     BP 128/80 (BP Location: Left Arm, Patient Position: Sitting, Cuff Size: Normal)   Pulse 100   Temp 97.7 F (36.5 C)   Ht 5\' 5"  (1.651 m)   Wt 227 lb 9.6 oz (103.2 kg)   LMP 12/25/2006   SpO2 97%   BMI 37.87 kg/m    Physical Exam Vitals and nursing note reviewed.  Constitutional:      General: She is not in acute distress.    Appearance: Normal appearance.  HENT:     Head: Normocephalic.     Right Ear: Tympanic membrane, ear canal and external ear normal.     Left Ear: Tympanic membrane, ear canal and external ear normal.     Mouth/Throat:     Mouth: Mucous membranes are moist.     Pharynx: Posterior oropharyngeal erythema present. No oropharyngeal exudate.  Eyes:     Conjunctiva/sclera: Conjunctivae normal.  Cardiovascular:     Rate and Rhythm: Normal rate and regular rhythm.     Pulses: Normal pulses.     Heart sounds: Normal heart sounds.  Pulmonary:     Effort: Pulmonary effort is normal.     Breath  sounds: Normal breath sounds.  Musculoskeletal:     Cervical back: Normal range of motion and neck supple. Tenderness present.  Lymphadenopathy:     Cervical: No cervical adenopathy.  Skin:    General: Skin is warm.  Neurological:     General: No focal deficit present.     Mental Status: She is alert and oriented to person, place, and time.  Psychiatric:        Mood and Affect: Mood normal.        Behavior: Behavior normal.        Thought Content: Thought content normal.        Judgment: Judgment normal.    The 10-year ASCVD risk score (Arnett DK, et al., 2019) is: 4%    Assessment & Plan:   Problem List Items Addressed This Visit       Cardiovascular and Mediastinum   Cardiomyopathy, secondary-sarcoid   Chronic, stable.  She is currently following with cardiology and has an ICD in place. She last saw them in January, note reviewed       Primary hypertension   Chronic, stable.  Continue amlodipine  5 mg daily and spironolactone  12.5 mg daily.  Follow-up in 6 months.        Respiratory   Pulmonary sarcoidosis (HCC)   She is currently following with  Dr. Bertrum Brodie for this. Continue collaborations and recommendations from pulmonology       Other Visit Diagnoses       Upper respiratory tract infection, unspecified type    -  Primary   Mild, self-limiting condition with congestion and cough. Continue coricidin as needed. Increase fluid intake, including hot tea. Report any worsening symptoms.     Fatigue, unspecified type       Fatigue has improved with increased physical activity. She saw her cardiologist and said her heart was stable. Continue physical activity to alleviate fatigue.       Return in about 6 months (around 12/02/2024) for CPE.    Odette Benjamin, NP

## 2024-06-02 NOTE — Assessment & Plan Note (Signed)
 Chronic, stable.  She is currently following with cardiology and has an ICD in place. She last saw them in January, note reviewed

## 2024-06-02 NOTE — Patient Instructions (Signed)
 It was great to see you!  Keep drinking plenty of fluids and taking coricidin   Let's follow-up in 6 months, sooner if you have concerns.  If a referral was placed today, you will be contacted for an appointment. Please note that routine referrals can sometimes take up to 3-4 weeks to process. Please call our office if you haven't heard anything after this time frame.  Take care,  Rheba Cedar, NP

## 2024-06-03 ENCOUNTER — Other Ambulatory Visit: Payer: Self-pay | Admitting: Nurse Practitioner

## 2024-06-04 NOTE — Telephone Encounter (Signed)
 Requesting: ROSUVASTATIN  10MG  TABLETS  Last Visit: 06/02/2024 Next Visit: Visit date not found Last Refill: 02/29/2024 by Richardo Chandler, MD  Please Advise

## 2024-06-05 NOTE — Telephone Encounter (Signed)
 Left message for patient to return call.

## 2024-06-23 ENCOUNTER — Ambulatory Visit (INDEPENDENT_AMBULATORY_CARE_PROVIDER_SITE_OTHER): Payer: BC Managed Care – PPO

## 2024-06-23 DIAGNOSIS — I472 Ventricular tachycardia, unspecified: Secondary | ICD-10-CM

## 2024-06-23 DIAGNOSIS — I429 Cardiomyopathy, unspecified: Secondary | ICD-10-CM

## 2024-06-23 LAB — CUP PACEART REMOTE DEVICE CHECK
Battery Remaining Longevity: 37 mo
Battery Voltage: 2.91 V
Brady Statistic AP VP Percent: 0 %
Brady Statistic AP VS Percent: 0.01 %
Brady Statistic AS VP Percent: 0.03 %
Brady Statistic AS VS Percent: 99.96 %
Brady Statistic RA Percent Paced: 0.01 %
Brady Statistic RV Percent Paced: 0.04 %
Date Time Interrogation Session: 20250630012302
HighPow Impedance: 49 Ohm
HighPow Impedance: 60 Ohm
Implantable Lead Connection Status: 753985
Implantable Lead Connection Status: 753985
Implantable Lead Implant Date: 20120308
Implantable Lead Implant Date: 20120308
Implantable Lead Location: 753859
Implantable Lead Location: 753860
Implantable Lead Model: 5076
Implantable Lead Model: 6947
Implantable Pulse Generator Implant Date: 20190913
Lead Channel Impedance Value: 285 Ohm
Lead Channel Impedance Value: 342 Ohm
Lead Channel Impedance Value: 437 Ohm
Lead Channel Pacing Threshold Amplitude: 0.375 V
Lead Channel Pacing Threshold Amplitude: 0.5 V
Lead Channel Pacing Threshold Pulse Width: 0.4 ms
Lead Channel Pacing Threshold Pulse Width: 0.4 ms
Lead Channel Sensing Intrinsic Amplitude: 2.75 mV
Lead Channel Sensing Intrinsic Amplitude: 2.75 mV
Lead Channel Sensing Intrinsic Amplitude: 5.625 mV
Lead Channel Sensing Intrinsic Amplitude: 5.625 mV
Lead Channel Setting Pacing Amplitude: 1.5 V
Lead Channel Setting Pacing Amplitude: 2 V
Lead Channel Setting Pacing Pulse Width: 0.4 ms
Lead Channel Setting Sensing Sensitivity: 0.45 mV
Zone Setting Status: 755011
Zone Setting Status: 755011

## 2024-06-25 ENCOUNTER — Ambulatory Visit: Payer: Self-pay | Admitting: Cardiology

## 2024-07-03 ENCOUNTER — Other Ambulatory Visit: Payer: Self-pay | Admitting: Neurology

## 2024-07-03 DIAGNOSIS — G43409 Hemiplegic migraine, not intractable, without status migrainosus: Secondary | ICD-10-CM

## 2024-07-07 NOTE — Progress Notes (Unsigned)
 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  If headache persists tomorrow, will prescribe prednisone  taper.  If headache still doesn't abort, would increase topiramate . Migraine prevention:  Emgality  every 28 days, topiramate  25mg  at bedtime.  Also Nurtec daily for last 8 days prior to next Emgality  injection  Migraine rescue:  Nurtec.  Provided sample of Reyvow 100mg  to try as well Limit use of pain relievers to no more than 9 days out of the month to prevent risk of rebound or medication-overuse headache. Keep headache diary Follow up 1 year.  Subjective:  Shonte Dumas-Hightower is a 53 year old woman with sarcoidosis, ventricular tachycardia status post defibrillator, and history of idiopathic intracranial hypertension who follows up for migraines.   UPDATE: Migraines are controlled. Intensity: moderate Duration:  2 hours with Nurtec Frequency:  5 a month (usually last week prior to injection or just after taking injection Less that 1 hemiplegic migraine a month.   For the past 2 days, she has been experiencing a different headache, described as a bilateral occipital headache, initially pressure but now similar to a toothache.  No associated neck pain.  She has some associated nausea but no vomiting, photophobia or phonophobia and tingling at the crown.  Yesterday, while eating dinner, she had experienced numbness and weakness in the arms, lasting an hour.  The first day, it was on and off but it has since been persistent.  Started as 2-3/10, now 5-6/10 intensity.  She woke up with the headache.  No injury.  Thought it was due to the weather but hasn't responded to the Nurtec.  Nothing makes it worse or better.      Preventative  management:  Emgality  monthly, acetazolamide  250mg  BID, Nurtec daily 8 days prior to Emgality  injection. Rescue management:  Nurtec Current NSAIDS:  none Current analgesics:  Tylenol  Current triptans:  Contraindicated (ventricular tachycardia and stroke-like symptoms) Current ergotamine:  no Current anti-emetic:  no Current muscle relaxants:  no Current anti-anxiolytic:  no Current sleep aide:  no Current Antihypertensive medications:  no Current Antidepressant medications:  no Current Anticonvulsant medications: topiramate  25mg  at bedtime Current anti-CGRP:   Emgality , Nurtec Current Vitamins/Herbal/Supplements:  no Current Antihistamines/Decongestants:  no Other therapy:  no Hormone/Birth control:  No   Caffeine:  no Alcohol:  no Smoker:  no Diet:  hydrates Exercise:  walks Depression:  no; Anxiety:  no Other pain:  Pain related to sarcoidosis Sleep hygiene:  okay   HISTORY: She was diagnosed with pseudotumor cerebri in January 2009.  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  She underwent lumbar puncture on 01/24/08 which demonstrated normal opening pressure of 14 cm H2O with CSF glucose 68, protein 23 and 0 cell count.  She apparently had an MRI and MRV of the head performed, which were reportedly normal. She was on acetazolamide  for awhile, which was subsequently discontinued due to resolution of papilledema.  She sees the ophthalmologist annually.  Those headaches were dull right sided posterior headaches. She had a repeat eye exam with Dr. Octavia in November 2019, which showed mild blurring of the nasal disc margin but nothing dramatic.    She began experiencing migraines around age 33.  They are right frontal/temporal, non-throbbing and severe.  They are associated with nausea, photophobia, phonophobia, osmophobia, but not vomiting.  She sometimes sees white spots in her vision and has associated right upper and lower extremity numbness  and weakness.  They typically last 2 days and occur 2 to 3 times a month but have increased in frequency.  No known triggers.  Relieved by rest.   Past NSAIDS:  naproxen  550mg  (drowsiness), Advil  Migraine, Sprix  NS (ineffective and caused burning), Cambia  (effective but expensive) Past analgesics:  Tylenol , Excedrin Past abortive triptans:  no Past muscle relaxants:  Flexeril , tizanidine, Robaxin  Past anti-emetic:  Zofran  ODT 4mg  Past antihypertensive medications:  Toprol  XL 25mg  Past antidepressant medications:  no Past anticonvulsant medications:   topiramate  100mg  at bedtime (150mg  caused cognitive deficits) Past anti--CGRP: Aimovig  (possible hair loss) Past vitamins/Herbal/Supplements:  no Past antihistamines/decongestants:  no Other past therapies:  acetazolamide    Family history of headache:  mother    PAST MEDICAL HISTORY: Past Medical History:  Diagnosis Date   Headache    Lupus pernio    -skin bx confirmed at [facility] 05/07/2009 (personally reviewed) -looks improved 07/09/2009 -   Pertussis    Pseudotumor cerebri    .SABRASABRADr Cleotilde and Dr. Octavia - dxed Jan 2009, on diamox   - LP 01/24/2008 at [facility] - Normal ( pressure 14cm, gluc 68, Protein 23, Clear, colorless, RBC 3, WBC - too few to count)  - CT head non contrast done at [facility] 11/25/2007: normal. Ddoes not report MRI, MRA, MRV being done - 2nd opinon by Dr. Rosemarie - reportdly had MRI/MRV both normal; followup pending   Pulmonary sarcoidosis (HCC)    - Spiro 07/25/2010 on pred 10/mtx 10 -> FEv1 1.84L/775%, FVC 2.25L - SPiro 09/06/2010 -> Fev1 2.14L/81%. Cut pred to 5mg  per day. Cont mtx - SPiro 01/20/2011 - Fev1 1.6L/61%, Ratio 74%. CXR Worse -> increased pred to 10mg  perday. Cont Mtx.     Ventricular tachycardia (HCC)    Vitamin D  deficiency    - Aug 2011: 22 and low -> commence Rx. - Jan 2012: 11    MEDICATIONS: Current Outpatient Medications on File Prior to Visit  Medication Sig Dispense Refill    albuterol  (VENTOLIN  HFA) 108 (90 Base) MCG/ACT inhaler Inhale 2 puffs into the lungs every 6 (six) hours as needed for wheezing or shortness of breath. 8 g 3   amLODipine  (NORVASC ) 5 MG tablet TAKE 1 TABLET(5 MG) BY MOUTH DAILY 90 tablet 1   betamethasone  dipropionate 0.05 % cream Apply topically 2 (two) times daily as needed (itching). 30 g 1   Galcanezumab -gnlm (EMGALITY ) 120 MG/ML SOSY INJEC120MG  UNDER THE SKIN ONCE EVERY 30 DAYS 1 mL 0   ibuprofen  (ADVIL ) 800 MG tablet Take 1 tablet (800 mg total) by mouth every 8 (eight) hours as needed (pain). 21 tablet 0   meloxicam  (MOBIC ) 7.5 MG tablet TAKE 1 TABLET(7.5 MG) BY MOUTH DAILY 30 tablet 0   Multiple Vitamin (MULTIVITAMIN WITH MINERALS) TABS tablet Take 1 tablet by mouth daily.     Rimegepant Sulfate (NURTEC) 75 MG TBDP Take 1 tablet (75 mg total) by mouth daily as needed. 16 tablet 11   rosuvastatin  (CRESTOR ) 20 MG tablet Take 1 tablet (20 mg total) by mouth daily. 90 tablet 3   spironolactone  (ALDACTONE ) 25 MG tablet TAKE 1/2 TABLET(12.5 MG) BY MOUTH DAILY 45 tablet 3   Tiotropium Bromide  Monohydrate (SPIRIVA  RESPIMAT) 1.25 MCG/ACT AERS Inhale 2 puffs into the lungs daily. 4 g 6   topiramate  (TOPAMAX ) 25 MG tablet Take 1 tablet (25 mg total) by mouth at bedtime. 90 tablet  3   Vitamin D , Ergocalciferol , (DRISDOL ) 1.25 MG (50000 UNIT) CAPS capsule Take 1 capsule (50,000 Units total) by mouth every 7 (seven) days. 12 capsule 0   No current facility-administered medications on file prior to visit.     ALLERGIES: No Known Allergies  FAMILY HISTORY: Family History  Problem Relation Age of Onset   Allergies Mother    Diabetes Mother    Hypertension Mother    Hyperlipidemia Mother    Migraines Mother    Hyperlipidemia Father    Hypertension Father    Diabetes Father    Stroke Father    Hyperlipidemia Sister    Hypertension Sister    Meniere's disease Sister    Diabetes Sister    COPD Maternal Grandfather    Leukemia Paternal  Grandmother    Heart attack Paternal Grandfather    Colon cancer Neg Hx    Esophageal cancer Neg Hx    Rectal cancer Neg Hx    Stomach cancer Neg Hx       Objective:  Blood pressure 124/80, pulse 99, height 5' 5 (1.651 m), weight 229 lb (103.9 kg), last menstrual period 12/25/2006, SpO2 98%. General: No acute distress.  Patient appears well-groomed.   Head:  Normocephalic/atraumatic Neck:  Supple.  bilateral paraspinal tenderness.  Full range of motion. Heart:  Regular rate and rhythm. Neuro:  Alert and oriented.  Speech fluent and not dysarthric.  Language intact.  CN II-XII intact.  Bulk and tone normal.  Muscle strength 5/5 throughout.  Sensation to light touch intact.  Deep tendon reflexes 2+ throughout, toes downgoing.  Gait normal.  Romberg negative.    Juliene Dunnings, DO  CC: Tinnie Harada, NP

## 2024-07-08 ENCOUNTER — Ambulatory Visit: Payer: BC Managed Care – PPO | Admitting: Neurology

## 2024-07-08 ENCOUNTER — Encounter: Payer: Self-pay | Admitting: Neurology

## 2024-07-08 VITALS — BP 124/80 | HR 99 | Ht 65.0 in | Wt 229.0 lb

## 2024-07-08 DIAGNOSIS — G43409 Hemiplegic migraine, not intractable, without status migrainosus: Secondary | ICD-10-CM | POA: Diagnosis not present

## 2024-07-08 DIAGNOSIS — G43119 Migraine with aura, intractable, without status migrainosus: Secondary | ICD-10-CM

## 2024-07-08 DIAGNOSIS — G932 Benign intracranial hypertension: Secondary | ICD-10-CM | POA: Diagnosis not present

## 2024-07-08 MED ORDER — NURTEC 75 MG PO TBDP: 75.0000 mg | ORAL_TABLET | Freq: Every day | ORAL | 11 refills | Status: AC | PRN

## 2024-07-08 MED ORDER — EMGALITY 120 MG/ML ~~LOC~~ SOSY: PREFILLED_SYRINGE | SUBCUTANEOUS | 0 refills | Status: DC

## 2024-07-08 MED ORDER — TOPIRAMATE 25 MG PO TABS: 25.0000 mg | ORAL_TABLET | Freq: Every day | ORAL | 3 refills | Status: AC

## 2024-07-08 NOTE — Patient Instructions (Signed)
 Refill Emgality , topiramate , and Nurtec Will give your Toradol  shot today.  If headache still present by tomorrow, contact me and I will prescribe a prednisone  taper.  If headache still persists, would increase topiramate . If headache returns, try taking Reyvow (1 pill daily as needed).  Recommended not to drive for 8 hours after use due to potential drowsiness.

## 2024-07-24 NOTE — Progress Notes (Signed)
 Remote ICD transmission.

## 2024-08-18 ENCOUNTER — Telehealth: Payer: Self-pay | Admitting: Neurology

## 2024-08-18 MED ORDER — EMGALITY 120 MG/ML ~~LOC~~ SOSY: PREFILLED_SYRINGE | SUBCUTANEOUS | 5 refills | Status: AC

## 2024-08-18 NOTE — Telephone Encounter (Signed)
 Pt called in this afternoon and she needs a refill on her prescription called Emagality. Thanks

## 2024-09-18 ENCOUNTER — Other Ambulatory Visit: Payer: Self-pay | Admitting: Neurology

## 2024-09-22 ENCOUNTER — Ambulatory Visit (INDEPENDENT_AMBULATORY_CARE_PROVIDER_SITE_OTHER): Payer: BC Managed Care – PPO

## 2024-09-22 DIAGNOSIS — I472 Ventricular tachycardia, unspecified: Secondary | ICD-10-CM | POA: Diagnosis not present

## 2024-09-22 LAB — CUP PACEART REMOTE DEVICE CHECK
Battery Remaining Longevity: 37 mo
Battery Voltage: 2.9 V
Brady Statistic AP VP Percent: 0 %
Brady Statistic AP VS Percent: 0.01 %
Brady Statistic AS VP Percent: 0.03 %
Brady Statistic AS VS Percent: 99.96 %
Brady Statistic RA Percent Paced: 0.01 %
Brady Statistic RV Percent Paced: 0.04 %
Date Time Interrogation Session: 20250929063325
HighPow Impedance: 50 Ohm
HighPow Impedance: 59 Ohm
Implantable Lead Connection Status: 753985
Implantable Lead Connection Status: 753985
Implantable Lead Implant Date: 20120308
Implantable Lead Implant Date: 20120308
Implantable Lead Location: 753859
Implantable Lead Location: 753860
Implantable Lead Model: 5076
Implantable Lead Model: 6947
Implantable Pulse Generator Implant Date: 20190913
Lead Channel Impedance Value: 285 Ohm
Lead Channel Impedance Value: 342 Ohm
Lead Channel Impedance Value: 437 Ohm
Lead Channel Pacing Threshold Amplitude: 0.375 V
Lead Channel Pacing Threshold Amplitude: 0.625 V
Lead Channel Pacing Threshold Pulse Width: 0.4 ms
Lead Channel Pacing Threshold Pulse Width: 0.4 ms
Lead Channel Sensing Intrinsic Amplitude: 2.625 mV
Lead Channel Sensing Intrinsic Amplitude: 2.625 mV
Lead Channel Sensing Intrinsic Amplitude: 5.625 mV
Lead Channel Sensing Intrinsic Amplitude: 5.625 mV
Lead Channel Setting Pacing Amplitude: 1.5 V
Lead Channel Setting Pacing Amplitude: 2 V
Lead Channel Setting Pacing Pulse Width: 0.4 ms
Lead Channel Setting Sensing Sensitivity: 0.45 mV
Zone Setting Status: 755011
Zone Setting Status: 755011

## 2024-09-24 NOTE — Progress Notes (Signed)
 Remote ICD Transmission

## 2024-10-18 ENCOUNTER — Ambulatory Visit: Payer: Self-pay | Admitting: Cardiology

## 2024-10-24 ENCOUNTER — Ambulatory Visit: Admitting: Nurse Practitioner

## 2024-10-24 ENCOUNTER — Encounter: Payer: Self-pay | Admitting: Nurse Practitioner

## 2024-10-24 VITALS — BP 124/88 | HR 78 | Temp 96.9°F | Ht 65.0 in | Wt 234.6 lb

## 2024-10-24 DIAGNOSIS — M5441 Lumbago with sciatica, right side: Secondary | ICD-10-CM

## 2024-10-24 DIAGNOSIS — E119 Type 2 diabetes mellitus without complications: Secondary | ICD-10-CM | POA: Diagnosis not present

## 2024-10-24 DIAGNOSIS — Z23 Encounter for immunization: Secondary | ICD-10-CM

## 2024-10-24 LAB — POCT GLYCOSYLATED HEMOGLOBIN (HGB A1C): Hemoglobin A1C: 6.4 % — AB (ref 4.0–5.6)

## 2024-10-24 MED ORDER — TRIAMCINOLONE ACETONIDE 40 MG/ML IJ SUSP
40.0000 mg | Freq: Once | INTRAMUSCULAR | Status: AC
  Administered 2024-10-24: 40 mg via INTRAMUSCULAR

## 2024-10-24 MED ORDER — METHOCARBAMOL 500 MG PO TABS: 500.0000 mg | ORAL_TABLET | Freq: Three times a day (TID) | ORAL | 1 refills | Status: AC | PRN

## 2024-10-24 NOTE — Assessment & Plan Note (Signed)
 Chronic, stable. Her recent A1c is 6.4, indicating well-controlled diabetes with diet. No medication is needed. Discussed weight management and potential use of Ozempic. Focus on nutrition for diabetes management and weight control. Keep next appointment in 2 months.

## 2024-10-24 NOTE — Progress Notes (Signed)
 Established Patient Office Visit  Subjective   Patient ID: Alexandra Hood, female    DOB: 1971/09/01  Age: 53 y.o. MRN: [id number]  Chief Complaint  Patient presents with   Back Pain    Lower back pain for 1 week, Flu Vaccine    HPI  Discussed the use of AI scribe software for clinical note transcription with the patient, who gave verbal consent to proceed.  History of Present Illness   Alexandra Hood is a 53 year old female who presents with lower back pain radiating to the right leg.  She has experienced lower back pain for one week, beginning at work, with pain radiating down the right leg and affecting her ability to walk. The pain is more intense on the right side and worsens with sitting and standing. She has taken time off work due to the severity. She has a history of back pain, but this episode is more prolonged and includes leg involvement. Her mother and sister have a history of sciatica.  She uses Tylenol  and applies heat for relief. The pain intensity has decreased from ten to seven but remains constant at seven, with a burning sensation. There are no urinary symptoms, fever, or skin rashes. She denies initial injury.       ROS See pertinent positives and negatives per HPI.    Objective:     BP 124/88 (BP Location: Left Arm, Patient Position: Sitting, Cuff Size: Large)   Pulse 78   Temp (!) 96.9 F (36.1 C)   Ht 5' 5 (1.651 m)   Wt 234 lb 9.6 oz (106.4 kg)   LMP 12/25/2006   SpO2 100%   BMI 39.04 kg/m  BP Readings from Last 3 Encounters:  10/24/24 124/88  07/08/24 124/80  06/02/24 128/80   Wt Readings from Last 3 Encounters:  10/24/24 234 lb 9.6 oz (106.4 kg)  07/08/24 229 lb (103.9 kg)  06/02/24 227 lb 9.6 oz (103.2 kg)      Physical Exam Vitals and nursing note reviewed.  Constitutional:      General: She is not in acute distress.    Appearance: Normal appearance.  HENT:     Head: Normocephalic.  Eyes:      Conjunctiva/sclera: Conjunctivae normal.  Cardiovascular:     Rate and Rhythm: Normal rate and regular rhythm.     Pulses: Normal pulses.     Heart sounds: Normal heart sounds.  Pulmonary:     Effort: Pulmonary effort is normal.     Breath sounds: Normal breath sounds.  Musculoskeletal:        General: Tenderness (Right paraspinal muscles) present. No swelling.     Cervical back: Normal range of motion.     Comments: Straight leg raise positive on right, negative on left  Skin:    General: Skin is warm.  Neurological:     General: No focal deficit present.     Mental Status: She is alert and oriented to person, place, and time.  Psychiatric:        Mood and Affect: Mood normal.        Behavior: Behavior normal.        Thought Content: Thought content normal.        Judgment: Judgment normal.      Results for orders placed or performed in visit on 10/24/24  POCT glycosylated hemoglobin (Hb A1C)  Result Value Ref Range   Hemoglobin A1C 6.4 (A) 4.0 - 5.6 %   HbA1c POC (<>  result, manual entry)     HbA1c, POC (prediabetic range)     HbA1c, POC (controlled diabetic range)        The 10-year ASCVD risk score (Arnett DK, et al., 2019) is: 8.8%    Assessment & Plan:   Problem List Items Addressed This Visit       Endocrine   Diabetes mellitus without complication (HCC)   Chronic, stable. Her recent A1c is 6.4, indicating well-controlled diabetes with diet. No medication is needed. Discussed weight management and potential use of Ozempic. Focus on nutrition for diabetes management and weight control. Keep next appointment in 2 months.       Relevant Orders   POCT glycosylated hemoglobin (Hb A1C) (Completed)     Nervous and Auditory   Acute right-sided low back pain with right-sided sciatica - Primary   She experiences acute lower back pain radiating to the right leg, consistent with sciatica. Pain has reduced from 10 to 7. There is no history of injury or trauma.  Continue Tylenol  and heat application. Provide daily stretches, avoiding exacerbating positions. Prescribe methylcarbamol 500mg  at night for muscle relaxation. Administer Kenalog 40mg  IM injection for pain and inflammation. Advise against prolonged bed rest and encourage gentle movement.      Relevant Medications   methocarbamol  (ROBAXIN ) 500 MG tablet   Other Visit Diagnoses       Immunization due       Flu vaccine given today   Relevant Orders   Flu vaccine trivalent PF, 6mos and older(Flulaval,Afluria,Fluarix,Fluzone) (Completed)      Return if symptoms worsen or fail to improve.    Tinnie DELENA Harada, NP

## 2024-10-24 NOTE — Patient Instructions (Signed)
 It was great to see you!  Keep taking tylenol  and using heat  I have attached some stretches for you to do daily  We are giving you a steroid shot to help with pain and inflammation   Let's follow-up if your symptoms worsen or don't improve   Take care,  Tinnie Harada, NP

## 2024-10-24 NOTE — Assessment & Plan Note (Signed)
 She experiences acute lower back pain radiating to the right leg, consistent with sciatica. Pain has reduced from 10 to 7. There is no history of injury or trauma. Continue Tylenol  and heat application. Provide daily stretches, avoiding exacerbating positions. Prescribe methylcarbamol 500mg  at night for muscle relaxation. Administer Kenalog 40mg  IM injection for pain and inflammation. Advise against prolonged bed rest and encourage gentle movement.

## 2024-11-29 ENCOUNTER — Other Ambulatory Visit: Payer: Self-pay | Admitting: Nurse Practitioner

## 2024-12-01 NOTE — Telephone Encounter (Signed)
 Requesting: AMLODIPINE  BESYLATE 5MG  TABLETS  Last Visit: 10/24/2024 Next Visit: 12/04/2024 Last Refill: 03/04/2024  Please Advise

## 2024-12-04 ENCOUNTER — Encounter: Payer: Self-pay | Admitting: Nurse Practitioner

## 2024-12-04 ENCOUNTER — Ambulatory Visit: Admitting: Nurse Practitioner

## 2024-12-04 VITALS — BP 132/88 | HR 93 | Temp 97.1°F | Ht 65.0 in | Wt 225.0 lb

## 2024-12-04 DIAGNOSIS — Z0000 Encounter for general adult medical examination without abnormal findings: Secondary | ICD-10-CM

## 2024-12-04 DIAGNOSIS — B3731 Acute candidiasis of vulva and vagina: Secondary | ICD-10-CM

## 2024-12-04 DIAGNOSIS — I429 Cardiomyopathy, unspecified: Secondary | ICD-10-CM

## 2024-12-04 DIAGNOSIS — Z23 Encounter for immunization: Secondary | ICD-10-CM

## 2024-12-04 DIAGNOSIS — E559 Vitamin D deficiency, unspecified: Secondary | ICD-10-CM

## 2024-12-04 DIAGNOSIS — L659 Nonscarring hair loss, unspecified: Secondary | ICD-10-CM

## 2024-12-04 DIAGNOSIS — D860 Sarcoidosis of lung: Secondary | ICD-10-CM

## 2024-12-04 DIAGNOSIS — E119 Type 2 diabetes mellitus without complications: Secondary | ICD-10-CM

## 2024-12-04 DIAGNOSIS — G43711 Chronic migraine without aura, intractable, with status migrainosus: Secondary | ICD-10-CM

## 2024-12-04 DIAGNOSIS — I10 Essential (primary) hypertension: Secondary | ICD-10-CM

## 2024-12-04 DIAGNOSIS — E7800 Pure hypercholesterolemia, unspecified: Secondary | ICD-10-CM

## 2024-12-04 LAB — COMPREHENSIVE METABOLIC PANEL WITH GFR
ALT: 30 U/L (ref 0–35)
AST: 20 U/L (ref 0–37)
Albumin: 4.4 g/dL (ref 3.5–5.2)
Alkaline Phosphatase: 93 U/L (ref 39–117)
BUN: 11 mg/dL (ref 6–23)
CO2: 33 meq/L — ABNORMAL HIGH (ref 19–32)
Calcium: 9.9 mg/dL (ref 8.4–10.5)
Chloride: 101 meq/L (ref 96–112)
Creatinine, Ser: 0.71 mg/dL (ref 0.40–1.20)
GFR: 98.43 mL/min (ref >60.00)
Glucose, Bld: 110 mg/dL — ABNORMAL HIGH (ref 70–99)
Potassium: 4 meq/L (ref 3.5–5.1)
Sodium: 140 meq/L (ref 135–145)
Total Bilirubin: 0.3 mg/dL (ref 0.2–1.2)
Total Protein: 7.5 g/dL (ref 6.0–8.3)

## 2024-12-04 LAB — CBC WITH DIFFERENTIAL/PLATELET
Basophils Absolute: 0 K/uL (ref 0.0–0.1)
Basophils Relative: 0.3 % (ref 0.0–3.0)
Eosinophils Absolute: 0.1 K/uL (ref 0.0–0.7)
Eosinophils Relative: 1.4 % (ref 0.0–5.0)
HCT: 39.3 % (ref 36.0–46.0)
Hemoglobin: 13 g/dL (ref 12.0–15.0)
Lymphocytes Relative: 27.8 % (ref 12.0–46.0)
Lymphs Abs: 2.9 K/uL (ref 0.7–4.0)
MCHC: 33.1 g/dL (ref 30.0–36.0)
MCV: 86.4 fl (ref 78.0–100.0)
Monocytes Absolute: 0.6 K/uL (ref 0.1–1.0)
Monocytes Relative: 5.7 % (ref 3.0–12.0)
Neutro Abs: 6.7 K/uL (ref 1.4–7.7)
Neutrophils Relative %: 64.8 % (ref 43.0–77.0)
Platelets: 407 K/uL — ABNORMAL HIGH (ref 150.0–400.0)
RBC: 4.55 Mil/uL (ref 3.87–5.11)
RDW: 14.9 % (ref 11.5–15.5)
WBC: 10.3 K/uL (ref 4.0–10.5)

## 2024-12-04 LAB — LIPID PANEL
Cholesterol: 138 mg/dL (ref 0–200)
HDL: 44.5 mg/dL (ref >39.00)
LDL Cholesterol: 76 mg/dL (ref 0–99)
NonHDL: 93.95
Total CHOL/HDL Ratio: 3
Triglycerides: 91 mg/dL (ref 0.0–149.0)
VLDL: 18.2 mg/dL (ref 0.0–40.0)

## 2024-12-04 LAB — VITAMIN D 25 HYDROXY (VIT D DEFICIENCY, FRACTURES): VITD: 12.75 ng/mL — ABNORMAL LOW (ref 30.00–100.00)

## 2024-12-04 LAB — MICROALBUMIN / CREATININE URINE RATIO
Creatinine,U: 179.3 mg/dL
Microalb Creat Ratio: 6.8 mg/g (ref 0.0–30.0)
Microalb, Ur: 1.2 mg/dL (ref 0.0–1.9)

## 2024-12-04 MED ORDER — CLOBETASOL PROPIONATE 0.05 % EX SOLN: 1.0000 | Freq: Two times a day (BID) | CUTANEOUS | 0 refills | Status: AC

## 2024-12-04 MED ORDER — FLUCONAZOLE 150 MG PO TABS: 150.0000 mg | ORAL_TABLET | Freq: Once | ORAL | 0 refills | Status: AC

## 2024-12-04 NOTE — Patient Instructions (Signed)
 It was great to see you!  We are checking your labs today and will let you know the results via mychart/phone.   Call cardiology about scheduling with new provider  Start clobestasol scalp solution twice a day for 2 weeks   Take diflucan  once today  Let's follow-up in 6 months, sooner if you have concerns.  If a referral was placed today, you will be contacted for an appointment. Please note that routine referrals can sometimes take up to 3-4 weeks to process. Please call our office if you haven't heard anything after this time frame.  Take care,  Tinnie Harada, NP

## 2024-12-04 NOTE — Progress Notes (Unsigned)
 BP 132/88 (BP Location: Left Arm, Patient Position: Sitting, Cuff Size: Normal)   Pulse 93   Temp (!) 97.1 F (36.2 C)   Ht 5' 5 (1.651 m)   Wt 225 lb (102.1 kg)   LMP 12/25/2006   SpO2 95%   BMI 37.44 kg/m    Subjective:    Patient ID: Alexandra Hood, female    DOB: 04-Aug-1971, 53 y.o.   MRN: [id number]  CC: Chief Complaint  Patient presents with   Annual Exam    With fasting labs, concerns with right eye irritation and yeast infection    HPI: Alexandra Hood is a 53 y.o. female presenting on 12/04/2024 for comprehensive medical examination. Current medical complaints include:right eye irritation, yeast infection  She currently lives with: alone Menopausal Symptoms: no  Depression and Anxiety Screen done today and results listed below:     12/04/2024    9:07 AM 05/13/2024    8:59 AM 12/03/2023    9:38 AM 01/17/2023    8:02 AM 10/20/2022   11:00 AM  Depression screen PHQ 2/9  Decreased Interest 0 0 0 0 0  Down, Depressed, Hopeless 0 0 0 0 0  PHQ - 2 Score 0 0 0 0 0  Altered sleeping 0  0  0  Tired, decreased energy 1  1  0  Change in appetite 0  0  0  Feeling bad or failure about yourself  0  0  0  Trouble concentrating 0  0  0  Moving slowly or fidgety/restless 0  0  0  Suicidal thoughts 0  0  0  PHQ-9 Score 1  1   0   Difficult doing work/chores Somewhat difficult  Not difficult at all       Data saved with a previous flowsheet row definition      12/04/2024    9:07 AM 12/03/2023    9:38 AM 10/20/2022   11:00 AM 03/25/2020   10:04 AM  GAD 7 : Generalized Anxiety Score  Nervous, Anxious, on Edge 0 0 0 0  Control/stop worrying 0 0 0 0  Worry too much - different things 0 0 0 0  Trouble relaxing 0 0 0 0  Restless 0 0 0 0  Easily annoyed or irritable 0 0 0 0  Afraid - awful might happen 0 0 0 0  Total GAD 7 Score 0 0 0 0  Anxiety Difficulty Not difficult at all   Not difficult at all    The patient does not have a history of falls. I did  not complete a risk assessment for falls. A plan of care for falls was not documented.   Past Medical History:  Past Medical History:  Diagnosis Date   Headache    Lupus pernio (HCC)    -skin bx confirmed at [facility] 05/07/2009 (personally reviewed) -looks improved 07/09/2009 -   Pertussis    Pseudotumor cerebri    .SABRASABRADr Cleotilde and Dr. Octavia - dxed Jan 2009, on diamox   - LP 01/24/2008 at [facility] - Normal ( pressure 14cm, gluc 68, Protein 23, Clear, colorless, RBC 3, WBC - too few to count)  - CT head non contrast done at [facility] 11/25/2007: normal. Ddoes not report MRI, MRA, MRV being done - 2nd opinon by Dr. Rosemarie - reportdly had MRI/MRV both normal; followup pending   Pulmonary sarcoidosis    - Spiro 07/25/2010 on pred 10/mtx 10 -> FEv1 1.84L/775%, FVC 2.25L - SPiro 09/06/2010 ->  Fev1 2.14L/81%. Cut pred to 5mg  per day. Cont mtx - SPiro 01/20/2011 - Fev1 1.6L/61%, Ratio 74%. CXR Worse -> increased pred to 10mg  perday. Cont Mtx.     Ventricular tachycardia [facility])    Vitamin D  deficiency    - Aug 2011: 22 and low -> commence Rx. - Jan 2012: 11    Surgical History:  Past Surgical History:  Procedure Laterality Date   CARDIAC DEFIBRILLATOR PLACEMENT  march 2012   [employer]   ESOPHAGOGASTRODUODENOSCOPY (EGD) WITH PROPOFOL  N/A 02/25/2016   Procedure: ESOPHAGOGASTRODUODENOSCOPY (EGD) WITH PROPOFOL ;  Surgeon: Lynwood Bohr, MD;  Location: [facility];  Service: Endoscopy;  Laterality: N/A;   ICD GENERATOR CHANGEOUT N/A 09/06/2018   Procedure: ICD GENERATOR CHANGEOUT;  Surgeon: Fernande Elspeth BROCKS, MD;  Location: [facility] INVASIVE CV LAB;  Service: Cardiovascular;  Laterality: N/A;   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   amLODipine  (NORVASC ) 5 MG  tablet TAKE 1 TABLET(5 MG) BY MOUTH DAILY   betamethasone  dipropionate 0.05 % cream Apply topically 2 (two) times daily as needed (itching).   Galcanezumab -gnlm (EMGALITY ) 120 MG/ML SOSY INJEC120MG  UNDER THE SKIN ONCE EVERY 30 DAYS   ibuprofen  (ADVIL ) 800 MG tablet Take 1 tablet (800 mg total) by mouth every 8 (eight) hours as needed (pain).   methocarbamol  (ROBAXIN ) 500 MG tablet Take 1 tablet (500 mg total) by mouth every 8 (eight) hours as needed for muscle spasms.   Multiple Vitamin (MULTIVITAMIN WITH MINERALS) TABS tablet Take 1 tablet by mouth daily.   Rimegepant Sulfate (NURTEC) 75 MG TBDP Take 1 tablet (75 mg total) by mouth daily as needed.   rosuvastatin  (CRESTOR ) 20 MG tablet Take 1 tablet (20 mg total) by mouth daily.   spironolactone  (ALDACTONE ) 25 MG tablet TAKE 1/2 TABLET(12.5 MG) BY MOUTH DAILY   Tiotropium Bromide  Monohydrate (SPIRIVA  RESPIMAT) 1.25 MCG/ACT AERS Inhale 2 puffs into the lungs daily.   topiramate  (TOPAMAX ) 25 MG tablet Take 1 tablet (25 mg total) by mouth at bedtime.   Vitamin D , Ergocalciferol , (DRISDOL ) 1.25 MG (50000 UNIT) CAPS capsule Take 1 capsule (50,000 Units total) by mouth every 7 (seven) days.   Galcanezumab -gnlm (EMGALITY ) 120 MG/ML SOAJ INJECT 120 MG UNDER THE SKIN EVERY 30 DAYS (Patient not taking: Reported on 12/04/2024)   No current facility-administered medications on file prior to visit.    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[employer])  Tobacco Use   Smoking status: Former    Current packs/day: 0.00    Average packs/day: 1 pack/day for 15.0 years (15.0 ttl pk-yrs)    Types: Cigarettes    Start date: 03/02/1992    Quit date: 03/03/2007     Years since quitting: 17.7   Smokeless tobacco: Never  Vaping Use   Vaping status: Never Used  Substance and Sexual Activity   Alcohol use: No    Alcohol/week: 0.0 standard drinks of alcohol   Drug  use: No   Sexual activity: Not Currently    Comment: bilateral oophorectomy  Other Topics Concern   Not on file  Social History Narrative   Right handed   Drinks caffeine   Lives alone   Two floor home   Social Drivers of Health   Tobacco Use: Medium Risk (12/04/2024)   Patient History    Smoking Tobacco Use: Former    Smokeless Tobacco Use: Never    Passive Exposure: Not on file  Financial Resource Strain: Low Risk (12/03/2024)   Overall Financial Resource Strain (CARDIA)    Difficulty of Paying Living Expenses: Not hard at all  Food Insecurity: No Food Insecurity (12/03/2024)   Epic    Worried About Programme Researcher, Broadcasting/film/video in the Last Year: Never true    Ran Out of Food in the Last Year: Never true  Transportation Needs: No Transportation Needs (12/03/2024)   Epic    Lack of Transportation (Medical): No    Lack of Transportation (Non-Medical): No  Physical Activity: Insufficiently Active (12/03/2024)   Exercise Vital Sign    Days of Exercise per Week: 5 days    Minutes of Exercise per Session: 10 min  Stress: No Stress Concern Present (12/03/2024)   [employer] of Occupational Health - Occupational Stress Questionnaire    Feeling of Stress: Only a little  Social Connections: Moderately Integrated (12/03/2024)   Social Connection and Isolation Panel    Frequency of Communication with Friends and Family: More than three times a week    Frequency of Social Gatherings with Friends and Family: More than three times a week    Attends Religious Services: 1 to 4 times per year    Active Member of Clubs or Organizations: Yes    Attends Banker Meetings: 1 to 4 times per year    Marital Status: Separated  Intimate Partner Violence: Not on file  Depression  (PHQ2-9): Low Risk (12/04/2024)   Depression (PHQ2-9)    PHQ-2 Score: 1  Alcohol Screen: Low Risk (12/03/2024)   Alcohol Screen    Last Alcohol Screening Score (AUDIT): 1  Housing: Low Risk (12/03/2024)   Epic    Unable to Pay for Housing in the Last Year: No    Number of Times Moved in the Last Year: 0    Homeless in the Last Year: No  Utilities: Not on file  Health Literacy: Not on file   Tobacco Use History[2] Social History   Substance and Sexual Activity  Alcohol Use No   Alcohol/week: 0.0 standard drinks of alcohol    Family History:  Family History  Problem Relation Age of Onset   Allergies Mother    Diabetes Mother    Hypertension Mother    Hyperlipidemia Mother    Migraines Mother    Hyperlipidemia Father    Hypertension Father    Diabetes Father    Stroke Father    Hyperlipidemia Sister    Hypertension Sister    Meniere's disease Sister    Diabetes Sister    COPD Maternal Grandfather    Leukemia Paternal Grandmother    Heart attack Paternal Grandfather    Colon cancer Neg Hx    Esophageal cancer Neg Hx    Rectal cancer Neg Hx    Stomach cancer Neg Hx     Past medical history, surgical history, medications, allergies, family history and social history reviewed with patient today and changes made to appropriate areas of the chart.   ROS All other  ROS negative except what is listed above and in the HPI.      Objective:    BP 132/88 (BP Location: Left Arm, Patient Position: Sitting, Cuff Size: Normal)   Pulse 93   Temp (!) 97.1 F (36.2 C)   Ht 5' 5 (1.651 m)   Wt 225 lb (102.1 kg)   LMP 12/25/2006   SpO2 95%   BMI 37.44 kg/m   Wt Readings from Last 3 Encounters:  12/04/24 225 lb (102.1 kg)  10/24/24 234 lb 9.6 oz (106.4 kg)  07/08/24 229 lb (103.9 kg)    Physical Exam  Results for orders placed or performed in visit on 10/24/24  POCT glycosylated hemoglobin (Hb A1C)   Collection Time: 10/24/24 12:53 PM  Result Value Ref Range    Hemoglobin A1C 6.4 (A) 4.0 - 5.6 %   HbA1c POC (<> result, manual entry)     HbA1c, POC (prediabetic range)     HbA1c, POC (controlled diabetic range)        Assessment & Plan:   Problem List Items Addressed This Visit       Cardiovascular and Mediastinum   Cardiomyopathy, secondary-sarcoid   Migraine   Primary hypertension   Relevant Orders   CBC with Differential/Platelet   Comprehensive metabolic panel with GFR     Respiratory   Pulmonary sarcoidosis     Endocrine   Diabetes mellitus without complication (HCC)   Relevant Orders   CBC with Differential/Platelet   Comprehensive metabolic panel with GFR   Microalbumin / creatinine urine ratio     Other   Vitamin D  deficiency   Relevant Orders   VITAMIN D  25 Hydroxy (Vit-D Deficiency, Fractures)   Pure hypercholesterolemia   Relevant Orders   CBC with Differential/Platelet   Comprehensive metabolic panel with GFR   Lipid panel   Routine general medical examination at a health care facility - Primary     Follow up plan: No follow-ups on file.   LABORATORY TESTING:  - Pap smear: up to date  IMMUNIZATIONS:   - Tdap: Tetanus vaccination status reviewed: last tetanus booster within 10 years. - Influenza: Up to date - Pneumovax: Up to date - Prevnar: Administered today - HPV: Not applicable - Shingrix vaccine: Declined  SCREENING: -Mammogram: Up to date  - Colonoscopy: Up to date  - Bone Density: Not applicable   PATIENT COUNSELING:   Advised to take 1 mg of folate supplement per day if capable of pregnancy.   Sexuality: Discussed sexually transmitted diseases, partner selection, use of condoms, avoidance of unintended pregnancy  and contraceptive alternatives.   Advised to avoid cigarette smoking.  I discussed with the patient that most people either abstain from alcohol or drink within safe limits (<=14/week and <=4 drinks/occasion for males, <=7/weeks and <= 3 drinks/occasion for females) and that the  risk for alcohol disorders and other health effects rises proportionally with the number of drinks per week and how often a drinker exceeds daily limits.  Discussed cessation/primary prevention of drug use and availability of treatment for abuse.   Diet: Encouraged to adjust caloric intake to maintain  or achieve ideal body weight, to reduce intake of dietary saturated fat and total fat, to limit sodium intake by avoiding high sodium foods and not adding table salt, and to maintain adequate dietary potassium and calcium  preferably from fresh fruits, vegetables, and low-fat dairy products.    stressed the importance of regular exercise  Injury prevention: Discussed safety belts, safety helmets, smoke  detector, smoking near bedding or upholstery.   Dental health: Discussed importance of regular tooth brushing, flossing, and dental visits.    NEXT PREVENTATIVE PHYSICAL DUE IN 1 YEAR. No follow-ups on file.  Mavi Un A Rayne Cowdrey    [1] No Known Allergies [2]  Social History Tobacco Use  Smoking Status Former   Current packs/day: 0.00   Average packs/day: 1 pack/day for 15.0 years (15.0 ttl pk-yrs)   Types: Cigarettes   Start date: 03/02/1992   Quit date: 03/03/2007   Years since quitting: 17.7  Smokeless Tobacco Never

## 2024-12-05 ENCOUNTER — Ambulatory Visit: Payer: Self-pay | Admitting: Nurse Practitioner

## 2024-12-05 DIAGNOSIS — H5789 Other specified disorders of eye and adnexa: Secondary | ICD-10-CM | POA: Insufficient documentation

## 2024-12-05 MED ORDER — VITAMIN D (ERGOCALCIFEROL) 1.25 MG (50000 UNIT) PO CAPS: 50000.0000 [IU] | ORAL_CAPSULE | ORAL | 0 refills | Status: AC

## 2024-12-05 NOTE — Assessment & Plan Note (Signed)
She is currently following with Dr. Chase Caller for this. Continue collaborations and recommendations from pulmonology

## 2024-12-05 NOTE — Assessment & Plan Note (Signed)
 Recurrent with two bald spots, previous treatment was effective. Prescribed clobetasol scalp solution twice daily for up to two weeks.

## 2024-12-05 NOTE — Assessment & Plan Note (Signed)
 Chronic, stable. Well-controlled diabetes with diet, no medication is needed. Discussed weight management and potential use of Ozempic. Focus on nutrition for diabetes management and weight control. Check CMP, CBC, urine microalbumin today. Follow-up in 6 months.

## 2024-12-05 NOTE — Assessment & Plan Note (Signed)
 Health maintenance reviewed and updated. Discussed nutrition, exercise. Follow-up 1 year.

## 2024-12-05 NOTE — Assessment & Plan Note (Signed)
Chronic, stable.  She is currently following with neurology.  She is taking Emgality, Topamax 25 mg at bedtime, and Nurtec as needed.  She currently gets about 3 migraines per month.  Continue recommendations in collaboration with neurology.

## 2024-12-05 NOTE — Assessment & Plan Note (Signed)
 Chronic, stable.  Continue amlodipine  5 mg daily and spironolactone  12.5 mg daily.  Check CMP, CBC today. Follow-up in 6 months.

## 2024-12-05 NOTE — Assessment & Plan Note (Signed)
 She has a history of vitamin D deficiency, will check levels today and adjust regimen based on results.

## 2024-12-05 NOTE — Assessment & Plan Note (Signed)
Chronic, stable.  Continue rosuvastatin 10 mg daily.  Check CMP, CBC, lipid panel today 

## 2024-12-05 NOTE — Assessment & Plan Note (Signed)
 Chronic, stable.  She is currently following with cardiology and has an ICD in place. She last saw them in January, note reviewed

## 2024-12-05 NOTE — Assessment & Plan Note (Signed)
 She experiences acute irritation with itching and burning, possibly due to early conjunctivitis or hair contact. Use over-the-counter eye drops and monitor symptoms. Contact if symptoms worsen or pain develops. Consider ophthalmologist follow-up if symptoms persist or worsen.

## 2024-12-22 ENCOUNTER — Ambulatory Visit: Payer: BC Managed Care – PPO

## 2024-12-22 DIAGNOSIS — I472 Ventricular tachycardia, unspecified: Secondary | ICD-10-CM

## 2024-12-23 LAB — CUP PACEART REMOTE DEVICE CHECK
Battery Remaining Longevity: 35 mo
Battery Voltage: 2.89 V
Brady Statistic AP VP Percent: 0 %
Brady Statistic AP VS Percent: 0.01 %
Brady Statistic AS VP Percent: 0.03 %
Brady Statistic AS VS Percent: 99.95 %
Brady Statistic RA Percent Paced: 0.02 %
Brady Statistic RV Percent Paced: 0.03 %
Date Time Interrogation Session: 20251229073823
HighPow Impedance: 54 Ohm
HighPow Impedance: 65 Ohm
Implantable Lead Connection Status: 753985
Implantable Lead Connection Status: 753985
Implantable Lead Implant Date: 20120308
Implantable Lead Implant Date: 20120308
Implantable Lead Location: 753859
Implantable Lead Location: 753860
Implantable Lead Model: 5076
Implantable Lead Model: 6947
Implantable Pulse Generator Implant Date: 20190913
Lead Channel Impedance Value: 285 Ohm
Lead Channel Impedance Value: 399 Ohm
Lead Channel Impedance Value: 456 Ohm
Lead Channel Pacing Threshold Amplitude: 0.375 V
Lead Channel Pacing Threshold Amplitude: 0.625 V
Lead Channel Pacing Threshold Pulse Width: 0.4 ms
Lead Channel Pacing Threshold Pulse Width: 0.4 ms
Lead Channel Sensing Intrinsic Amplitude: 3.25 mV
Lead Channel Sensing Intrinsic Amplitude: 3.25 mV
Lead Channel Sensing Intrinsic Amplitude: 6.125 mV
Lead Channel Sensing Intrinsic Amplitude: 6.125 mV
Lead Channel Setting Pacing Amplitude: 1.5 V
Lead Channel Setting Pacing Amplitude: 2 V
Lead Channel Setting Pacing Pulse Width: 0.4 ms
Lead Channel Setting Sensing Sensitivity: 0.45 mV
Zone Setting Status: 755011
Zone Setting Status: 755011

## 2024-12-28 ENCOUNTER — Ambulatory Visit: Payer: Self-pay | Admitting: Cardiology

## 2024-12-30 NOTE — Progress Notes (Signed)
 Remote ICD Transmission

## 2025-01-08 NOTE — Progress Notes (Signed)
 "       OV 07/06/2014  Chief Complaint  Patient presents with   Follow-up    Pt states her breathing has slightly improved since last OV, pt is able to do more activities. Pt c/o RUQ pain with inspiration. Denies cough and CP/tightness. PT states she has DVT in left lower leg that pt stated she noticed last week.    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 I cut the Medrol  to 2 mg once daily. Then on 05/22/2014 she went to the emergency room with abdominal pain right upper quadrant. Apparently this time she did have some fever low grade along with some yellow sputum, right pleuritic chest pain and shortness of breath. CT scan of the abdomen did show some right lower lobe pulmonary infiltrates and she was given Z-Pak. After these all the symptoms resolved except for the right pleuritic chest pain which has largely improved although still persistent and is mild. She did followup with my nurse practitioner 05/28/2014 and had a chest x-ray that shows prominence in the right lower lobe infiltrates suggesting the presence of pneumonia. She had ACEl checked which was 80 and elevated. She's been asked to take Medrol  at 4 mg once daily which is doing currently . Overall she is well . Spirometry today is baseline with an FVC of 2.3 the/78%. FEV1 of 1.88 L or 74% and a ratio of 80/96%.   OF note a few days ago she did notice that both calves swelled up and then spontaneously settled. She thinks she had a blood clot was no worsening dyspnea or cough.    #sarcoidosis  - continue medrol  at 4mg  per day  #Pneumoia Right lower lobe 05/22/14  - glad you are better but we need to keep an eye on the right sided pain  - if pain gets worse call us   - Do CT chest wo contrast mid-end Aug 2015  #Followup -  - Do CT chest wo contrast mid-end Aug 2015 - return to see me or my NP after CT   OV 08/17/2014  Chief Complaint  Patient  presents with   Follow-up    review CT results. Pt denies any increase in SOB, cough, chest tightness.    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 But she had pneumonia and some chest pain in spring/summer. So we decided to get CT chest aug 2015. CT shows improvement in UL sarcoid and down to a scar compared to 2010 but lower lobe she has worsening nodular disease. She herself is asymptomatic and feeling well and more keen on tapering off steroids. Denies new problems. 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  However, there are multiple peribronchovascular areas of ground glass and nodularity, in a perilymphatic distribution, new from 2010 but seen on CT AP 05/22/14 (lung bases).  Looks to me like sarcoid progression.  Atypical infection is considered less likely.   OV 01/22/2015 Chief Complaint  Patient presents with   Follow-up    Pt stated her breathing has improved since last OV. Pt stated her SOB has improved and she does not become as SOB as before. Pt denies cough and CP/tightness.    54 year old  female with pulmonary sarcoidosis stage II with ACD placement  I'm known her for several years. She used to be on methotrexate . 3 years ago she had normal function and on high-dose of steroids. Last time I saw her was in August 2015 at which time she was asymptomatic and was expressing a strong desire for coming off steroids. However CT scan of the chest at that time showed that she had new worsening sarcoidosis in terms of nodules in the lower lobes even though the upper lobe sarcoidosis: Original problem was improved to just a scar tissue. Currently she tells me she feels the best ever while she is being maintained on Medrol  4 mg per day [she does not like  prednisone ]. She's asymptomatic. She had a CT scan of the chest in December 2015 that shows stability compared to August 2015 but is definitely worse compared to 2010. We did a spirometry today and it shows continued decline of lung function with an FEV1 of 1.6 L/66%. She is very surprised by this the technique was good and she is asymptomatic. She is really reluctant to go up on the steroids add methotrexate  again  OV 11/12/2015  Chief Complaint  Patient presents with   Follow-up    Pt states she has had a dry cough recently, pt thinks this is due to the smokey air. Pt denies SOB and CP/tightness.    Follow-up pulmonary sarcoidosis stage II with AICD placement  Last seen January 2016. She is now maintained on Medrol  4 mg daily. In January 2016 FEV1 was 1.6 L/66%. She was asymptomatic at that time. Even at that time she was off methotrexate . There was concern for worsening sarcoidosis but after discussion we adopted a weight and watch approach. She currently continues Medrol  4 mg per day. She tells me she continues to be asymptomatic. Spirometry today shows FEV1 1.7 L/67%. There is similar to January 2016. She is a little bit worried about the obstruction in lung function. She is open to re-challenging herself with methotrexate  though she does not want to go up on steroids. She wants have a flu shot and pneumonia shot today she continues to work at [employer] in [place]    OV 01/16/2023  Subjective:  Patient ID: Alexandra Hood, female , DOB: 1971/07/03 , age 54 y.o. , MRN: [id number] , ADDRESS: [address] [place] [id number] PCP Nedra Tinnie LABOR, NP Patient Care Team: Nedra Tinnie LABOR, NP as PCP - General (Internal Medicine) Fernande Elspeth BROCKS, MD as Consulting Physician (Cardiology) Skeet Juliene SAUNDERS, DO as Consulting Physician (Neurology)  This Provider for this visit: Treatment Team:  Attending Provider: Geronimo Amel, MD    01/16/2023 -   Chief  Complaint  Patient presents with   Consult    Pt is here to reestablish care for pulmonary sarcoidosis. Last seen in 2016. Pt states she was doing well until she got covid in September. She states she has more episodes of SOB now. Pt did have albuterol  as needed but is out. She states it did help some.      HPI Alexandra Hood 54 y.o. -has a history of pulmonary sarcoidosis.  I personally have not seen her in over 6 years and therefore is a new consult.  Room was seeing her and all clinically had on methotrexate  and prednisone .  At that point sarcoid stabilized and she came off methotrexate  and prednisone  and then she was on observation.  She also had status post defibrillator placed in 2012.  She tells me in the last 5-6 issues been doing well.  Stable.  Did not feel her sarcoid in the skin or in the lungs were active.  She had a first bout of COVID-19 in September 2023.  Was mild outpatient COVID.  Did not require oxygen.  But then after that she started noticing when she walked her dog she would get short of breath and stop.  This continues to this day.  It is mild to moderate in intensity relieved by exertion.  Not getting worse not getting better.  No leg edema no hemoptysis no chest pain no cough no wheezing no orthopnea no paroxysmal nocturnal dyspnea.  She continues to work in [employer].  She plans to retire in a few years.  Of note she was a previous smoker.  She has seen cardiology was recommended repeat echo and a follow-up pending.  She had a high-resolution CT scan of the chest: She just has old scar tissue that I personally visualized and agree with the findings.  Her pulmonary function test DLCO stable although FVC is declined around 2% a year.  She has some mild emphysema as well.  She suffers from obesity and the weight is stable.  She does not have any interim diabetes.     2016: weight 226# 01/16/2023 - weig 223.8# CT Chest data - HRCT 12/29/22  Narrative &  Impression  CLINICAL DATA:  Sarcoid.   EXAM: CT CHEST WITHOUT CONTRAST   TECHNIQUE: Multidetector CT imaging of the chest was performed following the standard protocol without intravenous contrast. High resolution imaging of the lungs, as well as inspiratory and expiratory imaging, was performed.   RADIATION DOSE REDUCTION: This exam was performed according to the departmental dose-optimization program which includes automated exposure control, adjustment of the mA and/or kV according to patient size and/or use of iterative reconstruction technique.   COMPARISON:  11/25/2018.   FINDINGS: Cardiovascular: Left anterior descending coronary artery calcification. Heart is at the upper limits of normal in size. No pericardial effusion.   Mediastinum/Nodes: No pathologically enlarged mediastinal or axillary lymph nodes. Hilar regions are difficult to definitively evaluate without IV contrast. Esophagus is grossly unremarkable.   Lungs/Pleura: Centrilobular and paraseptal emphysema. Minimal scattered peribronchovascular nodularity and ground-glass, as before. Scarring in the upper lobes. No pleural fluid. Minimal adherent debris in the airway.   Upper Abdomen: Liver is somewhat heterogeneous in attenuation. Visualized portions of the liver, gallbladder, adrenal glands, kidneys, spleen, pancreas, stomach and bowel are otherwise unremarkable. Upper abdominal lymph nodes are not enlarged by CT size criteria.   Musculoskeletal: Degenerative changes in the spine. No worrisome lytic or sclerotic lesions.   IMPRESSION: 1. Minimal scattered peribronchovascular nodularity and ground-glass, similar and compatible with the provided history of sarcoid. 2. Left anterior descending coronary artery calcification. 3. Liver appears steatotic. 4.  Emphysema (ICD10-J43.9).   1.     Electronically Signed   By: Newell Eke M.D.   On: 01/01/2023 11:51    No results found.   OV  05/14/2023  Subjective:  Patient ID: Alexandra Hood, female , DOB: [birth year] , age 11 y.o. , MRN: [id number] , ADDRESS: [address] [place] [id number] PCP Nedra Tinnie LABOR, NP Patient Care Team: Nedra Tinnie LABOR, NP as PCP - General (Internal Medicine) Fernande Elspeth BROCKS, MD as Consulting Physician (Cardiology) Skeet Juliene SAUNDERS, DO as Consulting Physician (Neurology)  This Provider for this visit: Treatment Team:  Attending Provider: Geronimo Amel, MD    05/14/2023 -  Chief Complaint  Patient presents with   Follow-up    F/up, no concerns     HPI Alexandra Hood 54 y.o. -     Returns for follow-up.  Last visit was started empiric Spiriva .  She states it does help.  Simultaneously Dr. Garnette Sage also gave her Aldactone .  She thinks both of the medicines helped and her shortness of breath is much better she feels reassured.  She believes it is in her best interest to continue Spiriva .  We did excess hypoxemia testing was normal without any desaturation.  Other issues: She did have a ER visit for pyelonephritis.  She been diagnosed with hypertension hyperlipidemia.   ECHO 01/18/23   IMPRESSIONS     1. Left ventricular ejection fraction, by estimation, is 55 to 60%. Left  ventricular ejection fraction by 3D volume is 55 %. The left ventricle has  normal function. The left ventricle has no regional wall motion  abnormalities. Left ventricular diastolic   parameters were normal. The average left ventricular global longitudinal  strain is -23.7 %. The global longitudinal strain is normal.   2. Right ventricular systolic function is normal. The right ventricular  size is normal. Tricuspid regurgitation signal is inadequate for assessing  PA pressure.   3. The mitral valve is normal in structure. Trivial mitral valve  regurgitation. No evidence of mitral stenosis.   4. The aortic valve is normal in structure. Aortic valve regurgitation is  not  visualized. No aortic stenosis is present.   5. The inferior vena cava is normal in size with greater than 50%  respiratory variability, suggesting right atrial pressure of 3 mmHg.     OV 11/12/2023  Subjective:  Patient ID: Alexandra Hood, female , DOB: Apr 09, 1971 , age 7 y.o. , MRN: [id number] , ADDRESS: [address] [place] [id number] PCP Nedra Tinnie LABOR, NP Patient Care Team: Nedra Tinnie LABOR, NP as PCP - General (Internal Medicine) Sage Elspeth BROCKS, MD as Consulting Physician (Cardiology) Skeet Juliene SAUNDERS, DO as Consulting Physician (Neurology)  This Provider for this visit: Treatment Team:  Attending Provider: Geronimo Amel, MD    11/12/2023 -   Chief Complaint  Patient presents with   Follow-up    Pft f/u      HPI Alexandra Hood 54 y.o. -returns for routine follow-up.  She says she is feeling well.  Her shortness of breath is under control with Spiriva  and Aldactone .  She had alpha-1 antitrypsin check.  I reviewed this it was normal levels.  Last CT scan was in January 2024 and the sarcoid is burnt out the chest of emphysema.  She was a smoker in the past and do some passive smoking.  Her main current issue is the migraines.  Highly active this past week.  She did see Dr. Juliene Skeet last 07/09/2023.  I reviewed the external medical record.  She is also seen primary care nurse practitioner in July 2024.  Otherwise no issues.  She had pulmonary function test during this restriction because of obesity.  DLCO was normal.  OV 01/09/2025  Subjective:  Patient ID: Alexandra Hood, female , DOB: August 18, 1971 , age 54 y.o. 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 Fernande # History of COVID-19 in September 2023 and subsequent dyspnea #Obesity with severe migraine multiple antimigraine medications   01/09/2025 -   Chief Complaint  Patient presents with   Follow-up    Pt states since LOV breathing has been about the same Prod cough occurs in the morning ( phlegm clear) and throughout day Dry cough occurs     HPI Alexandra Hood 54 y.o. -Alexandra Hood is a 55 year old female with sarcoidosis who presents with chronic cough and shortness of breath.  Here for annual visit.  She experiences shortness of breath and a productive cough, primarily in the mornings and about three to four times throughout the day. The sputum is clear. Her cough worsens in older buildings or dry environments. She used to use a humidifier at home but has stopped. Her breathing is about the same as before, with some improvement since 2024.    She had a cardiac CT scan last year, which was reported as clean, including the lung fields.  But in my personal visualization she does have some evidence of burned-out sarcoid that I showed her.  She is currently taking Spiriva , which she believes helps with her cough.  No emergency room visits, hospitalizations, or urgent care visits recently. She reports having sinus drainage only twice over the past year and denies daily sinus issues.  Other issues -She was diagnosed with prediabetes previously, but her primary doctor has not started her on any medication for diabetes. She is not currently taking metformin.   Simple office walk 224 (66+46 x 2) feet Pod A at [facility] x  3 laps goal with forehead probe 05/14/2023    O2 used ra   Number laps completed Sit stand x 15    Comments about pace god   Resting Pulse Ox/HR 96% and 80/min   Final Pulse Ox/HR 94% and 107/min   Desaturated </= 88% no   Desaturated <= 3% points no   Got Tachycardic >/= 90/min yes   Symptoms at end of test 2 of 10 dyspnea    Miscellaneous comments x     CT Chest data from date: CARDIAC CT JAN 2025  - personally visualized and independently interpreted : yes - my findings are: there is evidence of burnt out sarcoid ADDENDUM REPORT: 01/22/2024 17:34   EXAM: OVER-READ INTERPRETATION  CT CHEST   The following report is an over-read performed by radiologist Dr. Suzen Dials of [facility] on 01/22/2024. This over-read does not include interpretation of cardiac or coronary anatomy or pathology. The coronary calcium  score/coronary CTA interpretation by the cardiologist is attached.   COMPARISON:  December 29, 2022   FINDINGS: Cardiovascular: There are no significant extracardiac vascular findings.   Mediastinum/Nodes: There are no enlarged lymph nodes within the visualized mediastinum.   Lungs/Pleura: There is no pleural effusion. Mild linear scarring and/or atelectasis is seen within the anterolateral aspect of the right lower lobe.   Upper abdomen: No significant findings in the visualized upper abdomen.   Musculoskeletal/Chest wall: No chest wall mass or suspicious osseous findings within the visualized chest.   IMPRESSION: No significant extracardiac findings within the visualized chest.     Electronically Signed   By: Suzen Dials M.D.   On: 01/22/2024 17:34    PFT  Latest Ref Rng & Units 11/12/2023    1:23 PM 01/12/2023   11:02 AM 03/24/2015    1:50 PM  PFT Results  FVC-Pre L 2.07  2.02    FVC-Predicted Pre % 57  55  76   FVC-Post L 2.15  2.12  2.41   FVC-Predicted Post % 59  58  76   Pre FEV1/FVC % % 89  88  86   Post FEV1/FCV % % 88  90  87   FEV1-Pre L 1.84  1.79  2.07   FEV1-Predicted Pre % 64  62  79   FEV1-Post L 1.89   1.90  2.11   DLCO uncorrected ml/min/mmHg 17.75  20.58  17.92   DLCO UNC% % 82  95  69   DLCO corrected ml/min/mmHg 17.75  20.58    DLCO COR %Predicted % 82  95    DLVA Predicted % 125  139  110   TLC L 3.17  3.82  3.15   TLC % Predicted % 61  74  60   RV % Predicted % 52  84  49       Latest Reference Range & Units 01/28/08 22:30 01/30/08 06:19 05/12/09 00:00 06/16/09 13:59 07/09/09 16:57 08/13/09 13:39 06/13/10 11:23 09/06/10 16:37 01/20/11 17:12 02/28/11 10:30 12/06/11 17:08 03/05/12 09:51 03/27/12 13:58 03/12/20 18:44 07/18/23 09:39 12/03/23 09:49 12/04/24 09:36  Eosinophils Absolute 0.0 - 0.7 K/uL 0.1 0.1 0.1 0.0 0.1 0.1 0.0 0.1 0.1 0.0 0.0 0.2 0.1 0.2 0.2 0.1 0.1    LAB RESULTS last 96 hours No results found.       has a past medical history of Emphysema of lung (HCC), Headache, Lupus pernio (HCC), Pertussis, Pseudotumor cerebri, Pulmonary sarcoidosis, Ventricular tachycardia (HCC), and Vitamin D  deficiency.   reports that she quit smoking about 17 years ago. Her smoking use included cigarettes. She started smoking about 32 years ago. She has a 15 pack-year smoking history. She has never used smokeless tobacco.  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    Allergies[1]  Immunization History  Administered Date(s) Administered   Influenza Split 09/24/2014   Influenza Whole 09/29/2009, 09/26/2010, 09/25/2011   Influenza, Seasonal, Injecte, Preservative Fre 10/24/2024    Influenza,inj,Quad PF,6+ Mos 10/14/2013, 11/12/2015, 09/30/2019, 01/17/2023   PFIZER(Purple Top)SARS-COV-2 Vaccination 03/01/2020, 03/22/2020   Pneumococcal Conjugate-13 11/12/2015   Pneumococcal-Unspecified 12/25/2009   Td 02/10/2011, 03/12/2023    Family History  Problem Relation Age of Onset   Allergies Mother    Diabetes Mother    Hypertension Mother    Hyperlipidemia Mother    Migraines Mother    Hyperlipidemia Father    Hypertension Father    Diabetes Father    Stroke Father    Hyperlipidemia Sister    Hypertension Sister    Meniere's disease Sister    Diabetes Sister    COPD Maternal Grandfather    Leukemia Paternal Grandmother    Heart attack Paternal Grandfather    Colon cancer Neg Hx    Esophageal cancer  Neg Hx    Rectal cancer Neg Hx    Stomach cancer Neg Hx     Current Medications[2]      Objective:   Vitals:   01/09/25 1126  BP: 126/71  Pulse: (!) 101  Temp: 98.1 F (36.7 C)  TempSrc: Oral  SpO2: 98%  Weight: 229 lb 9.6 oz (104.1 kg)  Height: 5' 5 (1.651 m)    Estimated body mass index is 38.21 kg/m as calculated from the following:   Height as of this encounter: 5' 5 (1.651 m).   Weight as of this encounter: 229 lb 9.6 oz (104.1 kg).  @WEIGHTCHANGE @  Filed Weights   01/09/25 1126  Weight: 229 lb 9.6 oz (104.1 kg)     Physical Exam   General: No distress. Looks well O2 at rest: no Cane present: no Sitting in wheel chair: no Frail: no Obese: no Neuro: Alert and Oriented x 3. GCS 15. Speech normal Psych: Pleasant Resp:  Barrel Chest - no.  Wheeze - no, Crackles - no, No overt respiratory distress CVS: Normal heart sounds. Murmurs - no Ext: Stigmata of Connective Tissue Disease - no HEENT: Normal upper airway. PEERL +. No post nasal drip        Assessment/     Assessment & Plan Pulmonary sarcoidosis  Chronic cough  Chronic obstructive pulmonary disease with emphysema, unspecified emphysema type  (HCC)    PLAN Patient Instructions     ICD-10-CM   1. 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 Chronic cough  R05.3     3. Chronic obstructive pulmonary disease with emphysema, unspecified emphysema type (HCC)  J43.9       Cardiac CT 1 year ago January 2025 just showed some chronic changes in the lung consistent with burned-out sarcoid otherwise lung fields are clear.  Previous CTs have reported some mild emphysema.  Is still have some chronic cough early in the morning and few times a day despite Spiriva  which is helping you.  Based on the history cough gets worse depending on the environment I suspect airway issues from a burned-out sarcoid  Plan  - Try Breztri 2 puff 2 times daily inhaler for a few weeks and if this helps please call us  and we can substitute the Spiriva  for Symbicort or Dulera - Otherwise continue Spiriva  Respimat once daily   Follow-up --Return in 12 months to see Dr. Geronimo or sooner if needed    FOLLOWUP    Return in about 1 year (around 01/09/2026) for 15 min visit, with Dr Geronimo, Face to Face Visit.    SIGNATURE    Dr. Dorethia Geronimo, M.D., F.C.C.P,  Pulmonary and Critical Care Medicine Staff Physician, [facility] Director - Interstitial Lung Disease  Program  Pulmonary Fibrosis [facility] at [facility] [place], [place], [address]  Pager: [phone], If no answer or between  15:00h - 7:00h: call [phone] Telephone: [phone]  11:47 AM 01/09/2025    [1] No Known Allergies [2]  Current Outpatient Medications:    albuterol  (VENTOLIN  HFA) 108 (90 Base) MCG/ACT inhaler, Inhale 2 puffs into the lungs every 6 (six) hours as needed for wheezing or shortness of breath., Disp: 8 g, Rfl: 3   amLODipine  (NORVASC ) 5 MG tablet, TAKE 1 TABLET(5 MG) BY MOUTH DAILY, Disp: 90 tablet, Rfl: 1   betamethasone  dipropionate 0.05 % cream, Apply topically 2 (two) times daily as needed (itching)., Disp: 30 g,  Rfl: 1   clobetasol  (TEMOVATE ) 0.05 % external solution, Apply 1 Application topically 2 (two) times daily. Use no longer than 2 weeks, Disp: 50 mL, Rfl: 0   Galcanezumab -gnlm (EMGALITY ) 120 MG/ML SOAJ, INJECT 120 MG UNDER THE SKIN EVERY 30 DAYS, Disp: 1 mL, Rfl: 5   Galcanezumab -gnlm (EMGALITY ) 120 MG/ML SOSY, INJEC120MG  UNDER THE SKIN ONCE EVERY 30 DAYS, Disp: 1 mL, Rfl: 5   ibuprofen  (ADVIL ) 800 MG tablet, Take 1 tablet (800 mg total) by mouth every 8 (eight) hours as needed (pain)., Disp: 21 tablet, Rfl: 0   methocarbamol  (ROBAXIN ) 500 MG tablet, Take 1 tablet (500 mg total) by mouth every 8 (eight) hours as needed for muscle spasms., Disp: 30 tablet, Rfl: 1   Multiple Vitamin (MULTIVITAMIN WITH MINERALS) TABS tablet, Take 1 tablet by mouth daily., Disp: , Rfl:    Rimegepant Sulfate (NURTEC) 75 MG TBDP, Take 1 tablet (75 mg total) by mouth daily as needed., Disp: 16 tablet, Rfl: 11   rosuvastatin  (CRESTOR ) 20 MG tablet, Take 1 tablet (20 mg total) by mouth daily., Disp: 90 tablet, Rfl: 3   spironolactone  (ALDACTONE ) 25 MG tablet, TAKE 1/2 TABLET(12.5 MG) BY MOUTH DAILY, Disp: 45 tablet, Rfl: 3   Tiotropium Bromide  Monohydrate (SPIRIVA  RESPIMAT) 1.25 MCG/ACT AERS, Inhale 2 puffs into the lungs daily., Disp: 4 g, Rfl: 6   topiramate  (TOPAMAX ) 25 MG tablet, Take 1 tablet (25 mg total) by mouth at bedtime., Disp: 90 tablet, Rfl: 3   Vitamin D , Ergocalciferol , (DRISDOL ) 1.25 MG (50000 UNIT) CAPS capsule, Take 1 capsule (50,000 Units total) by mouth every 7 (seven) days., Disp: 12 capsule, Rfl: 0  "

## 2025-01-09 ENCOUNTER — Ambulatory Visit: Admitting: Internal Medicine

## 2025-01-09 ENCOUNTER — Encounter: Payer: Self-pay | Admitting: Internal Medicine

## 2025-01-09 VITALS — BP 126/71 | HR 101 | Temp 98.1°F | Ht 65.0 in | Wt 229.6 lb

## 2025-01-09 DIAGNOSIS — J439 Emphysema, unspecified: Secondary | ICD-10-CM | POA: Diagnosis not present

## 2025-01-09 DIAGNOSIS — R053 Chronic cough: Secondary | ICD-10-CM | POA: Diagnosis not present

## 2025-01-09 DIAGNOSIS — Z87891 Personal history of nicotine dependence: Secondary | ICD-10-CM | POA: Diagnosis not present

## 2025-01-09 DIAGNOSIS — D860 Sarcoidosis of lung: Secondary | ICD-10-CM

## 2025-01-09 NOTE — Patient Instructions (Addendum)
"    ICD-10-CM   1. 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 Chronic cough  R05.3     3. Chronic obstructive pulmonary disease with emphysema, unspecified emphysema type (HCC)  J43.9       Cardiac CT 1 year ago January 2025 just showed some chronic changes in the lung consistent with burned-out sarcoid otherwise lung fields are clear.  Previous CTs have reported some mild emphysema.  Is still have some chronic cough early in the morning and few times a day despite Spiriva  which is helping you.  Based on the history cough gets worse depending on the environment I suspect airway issues from a burned-out sarcoid  Plan  - Try Breztri 2 puff 2 times daily inhaler for a few weeks and if this helps please call us  and we can substitute the Spiriva  for Symbicort or Dulera - Otherwise continue Spiriva  Respimat once daily   Follow-up --Return in 12 months to see Dr. Geronimo or sooner if needed "

## 2025-01-29 ENCOUNTER — Other Ambulatory Visit: Payer: Self-pay | Admitting: Student

## 2025-01-29 MED ORDER — SPIRONOLACTONE 25 MG PO TABS: 12.5000 mg | ORAL_TABLET | Freq: Every day | ORAL | 3 refills | Status: AC

## 2025-03-10 ENCOUNTER — Ambulatory Visit: Admitting: Neurology

## 2025-03-16 ENCOUNTER — Ambulatory Visit: Admitting: Internal Medicine

## 2025-06-04 ENCOUNTER — Ambulatory Visit: Admitting: Nurse Practitioner

## 2025-06-22 ENCOUNTER — Encounter

## 2025-09-21 ENCOUNTER — Encounter

## 2025-12-21 ENCOUNTER — Encounter

## 2026-03-22 ENCOUNTER — Encounter
